# Patient Record
Sex: Female | Born: 1970 | Race: White | Hispanic: No | Marital: Married | Smoking: Former smoker
Health system: Southern US, Community
[De-identification: ages and names within clinical notes are randomized; demographics above are authoritative.]

## PROBLEM LIST (undated history)

## (undated) DIAGNOSIS — Z87898 Personal history of other specified conditions: Secondary | ICD-10-CM

## (undated) DIAGNOSIS — N6019 Diffuse cystic mastopathy of unspecified breast: Secondary | ICD-10-CM

## (undated) DIAGNOSIS — Z8669 Personal history of other diseases of the nervous system and sense organs: Secondary | ICD-10-CM

## (undated) DIAGNOSIS — S43439A Superior glenoid labrum lesion of unspecified shoulder, initial encounter: Secondary | ICD-10-CM

## (undated) DIAGNOSIS — T7840XA Allergy, unspecified, initial encounter: Secondary | ICD-10-CM

## (undated) DIAGNOSIS — Z8742 Personal history of other diseases of the female genital tract: Secondary | ICD-10-CM

## (undated) DIAGNOSIS — I519 Heart disease, unspecified: Secondary | ICD-10-CM

## (undated) DIAGNOSIS — L404 Guttate psoriasis: Secondary | ICD-10-CM

## (undated) DIAGNOSIS — J45909 Unspecified asthma, uncomplicated: Secondary | ICD-10-CM

## (undated) DIAGNOSIS — L719 Rosacea, unspecified: Secondary | ICD-10-CM

## (undated) DIAGNOSIS — K219 Gastro-esophageal reflux disease without esophagitis: Secondary | ICD-10-CM

## (undated) HISTORY — DX: Gastro-esophageal reflux disease without esophagitis: K21.9

## (undated) HISTORY — DX: Rosacea, unspecified: L71.9

## (undated) HISTORY — DX: Heart disease, unspecified: I51.9

## (undated) HISTORY — DX: Diffuse cystic mastopathy of unspecified breast: N60.19

## (undated) HISTORY — PX: TOTAL ABDOMINAL HYSTERECTOMY: SHX209

## (undated) HISTORY — DX: Superior glenoid labrum lesion of unspecified shoulder, initial encounter: S43.439A

## (undated) HISTORY — DX: Personal history of other specified conditions: Z87.898

## (undated) HISTORY — DX: Personal history of other diseases of the nervous system and sense organs: Z86.69

## (undated) HISTORY — DX: Guttate psoriasis: L40.4

## (undated) HISTORY — DX: Unspecified asthma, uncomplicated: J45.909

## (undated) HISTORY — DX: Allergy, unspecified, initial encounter: T78.40XA

## (undated) HISTORY — PX: BREAST SURGERY: SHX581

## (undated) HISTORY — PX: BREAST EXCISIONAL BIOPSY: SUR124

## (undated) HISTORY — DX: Personal history of other diseases of the female genital tract: Z87.42

---

## 1992-01-02 HISTORY — PX: TONSILLECTOMY: SUR1361

## 1992-01-02 HISTORY — PX: LAPAROSCOPY: SHX197

## 1993-01-01 HISTORY — PX: TONSILLECTOMY AND ADENOIDECTOMY: SUR1326

## 2004-01-02 HISTORY — PX: COLONOSCOPY: SHX174

## 2004-06-06 ENCOUNTER — Inpatient Hospital Stay: Payer: Self-pay | Admitting: Internal Medicine

## 2004-06-06 ENCOUNTER — Other Ambulatory Visit: Payer: Self-pay

## 2004-06-13 ENCOUNTER — Ambulatory Visit: Payer: Self-pay | Admitting: Internal Medicine

## 2004-07-12 ENCOUNTER — Ambulatory Visit: Payer: Self-pay | Admitting: Cardiovascular Disease

## 2005-01-01 HISTORY — PX: CARDIAC CATHETERIZATION: SHX172

## 2005-01-12 ENCOUNTER — Ambulatory Visit: Payer: Self-pay | Admitting: Cardiovascular Disease

## 2005-03-22 ENCOUNTER — Ambulatory Visit: Payer: Self-pay | Admitting: Internal Medicine

## 2005-03-22 ENCOUNTER — Ambulatory Visit: Payer: Self-pay | Admitting: Cardiology

## 2005-03-23 ENCOUNTER — Observation Stay (HOSPITAL_COMMUNITY): Admission: EM | Admit: 2005-03-23 | Discharge: 2005-03-24 | Payer: Self-pay | Admitting: Emergency Medicine

## 2005-03-28 ENCOUNTER — Ambulatory Visit: Payer: Self-pay

## 2005-03-28 ENCOUNTER — Encounter: Payer: Self-pay | Admitting: Cardiology

## 2005-04-04 ENCOUNTER — Ambulatory Visit: Payer: Self-pay | Admitting: Internal Medicine

## 2005-04-04 ENCOUNTER — Ambulatory Visit (HOSPITAL_COMMUNITY): Admission: RE | Admit: 2005-04-04 | Discharge: 2005-04-04 | Payer: Self-pay | Admitting: Internal Medicine

## 2005-05-01 ENCOUNTER — Ambulatory Visit: Payer: Self-pay | Admitting: Cardiology

## 2005-05-03 ENCOUNTER — Ambulatory Visit: Payer: Self-pay | Admitting: Cardiology

## 2005-05-10 ENCOUNTER — Ambulatory Visit: Payer: Self-pay | Admitting: Cardiology

## 2005-06-05 ENCOUNTER — Ambulatory Visit: Payer: Self-pay | Admitting: General Surgery

## 2005-06-11 ENCOUNTER — Ambulatory Visit (HOSPITAL_COMMUNITY): Admission: RE | Admit: 2005-06-11 | Discharge: 2005-06-11 | Payer: Self-pay | Admitting: Cardiology

## 2005-06-11 ENCOUNTER — Ambulatory Visit: Payer: Self-pay | Admitting: Cardiology

## 2005-06-25 ENCOUNTER — Ambulatory Visit: Payer: Self-pay | Admitting: Cardiology

## 2005-10-04 ENCOUNTER — Ambulatory Visit: Payer: Self-pay

## 2005-10-11 ENCOUNTER — Ambulatory Visit: Payer: Self-pay | Admitting: Cardiology

## 2006-01-01 HISTORY — PX: CHOLECYSTECTOMY: SHX55

## 2006-01-01 HISTORY — PX: BREAST MASS EXCISION: SHX1267

## 2006-02-18 ENCOUNTER — Emergency Department: Payer: Self-pay

## 2006-03-19 ENCOUNTER — Ambulatory Visit: Payer: Self-pay | Admitting: General Practice

## 2006-04-18 ENCOUNTER — Ambulatory Visit: Payer: Self-pay | Admitting: Cardiology

## 2006-06-06 ENCOUNTER — Ambulatory Visit: Payer: Self-pay | Admitting: General Surgery

## 2006-07-24 ENCOUNTER — Ambulatory Visit: Payer: Self-pay | Admitting: Orthopaedic Surgery

## 2006-11-19 ENCOUNTER — Ambulatory Visit: Payer: Self-pay | Admitting: Cardiology

## 2007-02-04 ENCOUNTER — Ambulatory Visit: Payer: Self-pay | Admitting: Internal Medicine

## 2007-02-04 ENCOUNTER — Ambulatory Visit (HOSPITAL_COMMUNITY): Admission: RE | Admit: 2007-02-04 | Discharge: 2007-02-04 | Payer: Self-pay | Admitting: Cardiology

## 2007-02-17 ENCOUNTER — Ambulatory Visit: Payer: Self-pay

## 2007-04-07 ENCOUNTER — Ambulatory Visit: Payer: Self-pay | Admitting: Internal Medicine

## 2007-04-07 LAB — CONVERTED CEMR LAB
ALT: 16 units/L (ref 0–35)
AST: 17 units/L (ref 0–37)
Albumin: 4.1 g/dL (ref 3.5–5.2)
Alkaline Phosphatase: 78 units/L (ref 39–117)
BUN: 9 mg/dL (ref 6–23)
Basophils Absolute: 0.1 10*3/uL (ref 0.0–0.1)
Basophils Relative: 0.8 % (ref 0.0–1.0)
Bilirubin, Direct: 0.1 mg/dL (ref 0.0–0.3)
CO2: 27 meq/L (ref 19–32)
Calcium: 9.4 mg/dL (ref 8.4–10.5)
Chloride: 104 meq/L (ref 96–112)
Creatinine, Ser: 1 mg/dL (ref 0.4–1.2)
Eosinophils Absolute: 0.1 10*3/uL (ref 0.0–0.7)
Eosinophils Relative: 1.6 % (ref 0.0–5.0)
GFR calc Af Amer: 82 mL/min
GFR calc non Af Amer: 67 mL/min
Glucose, Bld: 83 mg/dL (ref 70–99)
HCT: 38.8 % (ref 36.0–46.0)
Hemoglobin: 13.6 g/dL (ref 12.0–15.0)
Lymphocytes Relative: 28 % (ref 12.0–46.0)
MCHC: 35.1 g/dL (ref 30.0–36.0)
MCV: 93.1 fL (ref 78.0–100.0)
Monocytes Absolute: 0.5 10*3/uL (ref 0.1–1.0)
Monocytes Relative: 7 % (ref 3.0–12.0)
Neutro Abs: 4.1 10*3/uL (ref 1.4–7.7)
Neutrophils Relative %: 62.6 % (ref 43.0–77.0)
Platelets: 204 10*3/uL (ref 150–400)
Potassium: 4.2 meq/L (ref 3.5–5.1)
RBC: 4.17 M/uL (ref 3.87–5.11)
RDW: 12 % (ref 11.5–14.6)
Sodium: 135 meq/L (ref 135–145)
TSH: 1.02 microintl units/mL (ref 0.35–5.50)
Total Bilirubin: 0.8 mg/dL (ref 0.3–1.2)
Total Protein: 6.6 g/dL (ref 6.0–8.3)
WBC: 6.6 10*3/uL (ref 4.5–10.5)

## 2007-06-26 ENCOUNTER — Ambulatory Visit: Payer: Self-pay | Admitting: General Surgery

## 2008-01-02 HISTORY — PX: BREAST BIOPSY: SHX20

## 2008-06-29 ENCOUNTER — Ambulatory Visit: Payer: Self-pay | Admitting: General Surgery

## 2008-07-16 ENCOUNTER — Encounter: Payer: Self-pay | Admitting: Cardiology

## 2008-07-28 ENCOUNTER — Telehealth (INDEPENDENT_AMBULATORY_CARE_PROVIDER_SITE_OTHER): Payer: Self-pay | Admitting: *Deleted

## 2008-08-20 ENCOUNTER — Telehealth: Payer: Self-pay | Admitting: Cardiology

## 2009-07-05 ENCOUNTER — Encounter: Payer: Self-pay | Admitting: Family Medicine

## 2009-07-05 ENCOUNTER — Ambulatory Visit: Payer: Self-pay | Admitting: General Surgery

## 2009-07-05 LAB — HM MAMMOGRAPHY

## 2009-07-12 ENCOUNTER — Encounter: Payer: Self-pay | Admitting: Family Medicine

## 2009-08-11 ENCOUNTER — Ambulatory Visit: Payer: Self-pay | Admitting: Internal Medicine

## 2009-08-11 DIAGNOSIS — Z9189 Other specified personal risk factors, not elsewhere classified: Secondary | ICD-10-CM | POA: Insufficient documentation

## 2009-08-11 DIAGNOSIS — Z8669 Personal history of other diseases of the nervous system and sense organs: Secondary | ICD-10-CM | POA: Insufficient documentation

## 2009-08-11 DIAGNOSIS — N63 Unspecified lump in unspecified breast: Secondary | ICD-10-CM | POA: Insufficient documentation

## 2009-08-11 DIAGNOSIS — E669 Obesity, unspecified: Secondary | ICD-10-CM | POA: Insufficient documentation

## 2009-08-12 ENCOUNTER — Ambulatory Visit: Payer: Self-pay | Admitting: Internal Medicine

## 2009-08-12 LAB — CONVERTED CEMR LAB
BUN: 11 mg/dL (ref 6–23)
CO2: 28 meq/L (ref 19–32)
Calcium: 9.2 mg/dL (ref 8.4–10.5)
Chloride: 107 meq/L (ref 96–112)
Cholesterol: 168 mg/dL (ref 0–200)
Creatinine, Ser: 0.8 mg/dL (ref 0.4–1.2)
GFR calc non Af Amer: 81.24 mL/min (ref >60)
Glucose, Bld: 91 mg/dL (ref 70–99)
HDL: 41.6 mg/dL (ref >39.00)
LDL Cholesterol: 89 mg/dL (ref 0–99)
Potassium: 4.3 meq/L (ref 3.5–5.1)
Sodium: 141 meq/L (ref 135–145)
TSH: 0.91 microintl units/mL (ref 0.35–5.50)
Total CHOL/HDL Ratio: 4
Triglycerides: 187 mg/dL — ABNORMAL HIGH (ref 0.0–149.0)
VLDL: 37.4 mg/dL (ref 0.0–40.0)

## 2009-08-15 ENCOUNTER — Encounter: Payer: Self-pay | Admitting: Family Medicine

## 2009-08-16 ENCOUNTER — Encounter: Payer: Self-pay | Admitting: Family Medicine

## 2009-08-16 ENCOUNTER — Encounter: Admission: RE | Admit: 2009-08-16 | Discharge: 2009-08-16 | Payer: Self-pay | Admitting: Surgery

## 2009-09-13 ENCOUNTER — Ambulatory Visit: Payer: Self-pay | Admitting: Internal Medicine

## 2009-09-13 DIAGNOSIS — H6590 Unspecified nonsuppurative otitis media, unspecified ear: Secondary | ICD-10-CM | POA: Insufficient documentation

## 2010-01-31 NOTE — Assessment & Plan Note (Signed)
Summary: NEW PT TO EST/CPX/CLE   Vital Signs:  Patient profile:   40 year old female Height:      64.5 inches Weight:      205.25 pounds BMI:     34.81 Temp:     98.4 degrees F oral Pulse rate:   60 / minute Pulse rhythm:   regular BP sitting:   122 / 84  (left arm) Cuff size:   large  Vitals Entered By: Selena Batten Dance CMA Duncan Dull) (August 11, 2009 10:24 AM) CC: New patient to establish   History of Present Illness: CC: new patient to establish  1. fm hx brca - strong fmhx (mother, maternal gma).  Pt with h/o breast biopsies and lumpectomy.  Mammo - 07/05/2009 - normal.  [place] (given hx of previous masses) showed lump with darkening around it.  Not thought to be fibrocystic changes.  Dr. Bernadette Hoit at [facility] said just to watch it.  Pt worried about watchful waiting, would like second opinion on this breast mass (given previous masses have been aggressively worked up).  2. weight gain - last year gained more weight than usual.  No change in diet, + less activity now because has sedentary job.  + constipation, no skin changes, hair changes.  No heat or cold intolerance.  since quite smoking, drinks 1 gallon sweet tea a day.  3. syncope episodes - cards Dr. Antoine Poche, passing out episodes.  w/u normal.  thought 2/2 overmedication?  Preventive Screening-Counseling & Management  Alcohol-Tobacco     Alcohol drinks/day: <1     Smoking Status: current     Year Started: 1986     Year Quit: 2004  Caffeine-Diet-Exercise     Caffeine use/day: 1 gallon/day      Sexual History:  currently monogamous.        Drug Use:  never.        Blood Transfusions:  no.    -  Date:  04/16/2007    TD booster done  Current Medications (verified): 1)  Cvs Ibuprofen 200 Mg Caps (Ibuprofen) 2)  Daily Value Multivitamin  Tabs (Multiple Vitamin) .... One Dialy  Allergies (verified): No Known Drug Allergies  Past History:  Past Medical History: GERD Migraines (occasional, improved since  hysterectomy) Syncope - cards w/u WNL (Hochrein) thought 2/2 overmedication vs arrhythmias Endometriosis  G2P2  Past Surgical History: Laparoscopy 1994 Endometriosis Cholecystectomy 2008 Mult breast biopsies bilaterally and mass removed right breast  ~2008, L breast core biopsy 2010 T&A 1995 Hysterectomy 2002, thinks needs pap smear (Dr. Laqueta Carina [facility])  Family History: BRCA - mother, GMA (mastectomy), father's GMA Skin CA - mother Son - hyperinsulinemic Mother - HTN, BRCA,  GMA - CVA Father and paternal uncles - CAD/MI starting age 59s  No colon CA  Social History: Quit smoking, social EtOH, no rec drugs Occupation - Print production planner Lives with husband and 2 children 1 dogSmoking Status:  current Caffeine use/day:  1 gallon/day Drug Use:  never Sexual History:  currently monogamous Blood Transfusions:  no  Review of Systems       The patient complains of weight gain, peripheral edema, and breast masses.  The patient denies anorexia, fever, weight loss, vision loss, decreased hearing, hoarseness, chest pain, syncope, dyspnea on exertion, prolonged cough, headaches, hemoptysis, abdominal pain, melena, hematochezia, severe indigestion/heartburn, hematuria, incontinence, muscle weakness, suspicious skin lesions, transient blindness, difficulty walking, and depression.    Physical Exam  General:  Well-developed,well-nourished,in no acute distress; alert,appropriate and cooperative throughout examination  Head:  Normocephalic and atraumatic without obvious abnormalities. No apparent alopecia or balding. Eyes:  No corneal or conjunctival inflammation noted. EOMI. Perrla.  Ears:  External ear exam shows no significant lesions or deformities.  Otoscopic examination reveals clear canals, tympanic membranes are intact bilaterally without bulging, retraction, inflammation or discharge. Hearing is grossly normal bilaterally. Nose:  External nasal examination shows no deformity or  inflammation. Nasal mucosa are pink and moist without lesions or exudates. Mouth:  Oral mucosa and oropharynx without lesions or exudates.  Teeth in good repair. Neck:  No deformities, masses, or tenderness noted. Breasts:  L breast with small mobile mass  ~1.5cm size central left hemisphere, feels cystic.  no axillary adenopathy Lungs:  Normal respiratory effort, chest expands symmetrically. Lungs are clear to auscultation, no crackles or wheezes. Heart:  Normal rate and regular rhythm. S1 and S2 normal without gallop, murmur, click, rub or other extra sounds. Abdomen:  Bowel sounds positive,abdomen soft and non-tender without masses, organomegaly or hernias noted. Msk:  No deformity or scoliosis noted of thoracic or lumbar spine.   Pulses:  2+ periph pulses Extremities:  No clubbing, cyanosis, edema, or deformity noted with normal full range of motion of all joints.   Skin:  Intact without suspicious lesions or rashes   Impression & Recommendations:  Problem # 1:  OBESITY, UNSPECIFIED (ICD-278.00) check TSH, FLP, glu.  likely attributable to sedentary lifestyle, encourage exercise, diet changes.  dsicuss at next visit. Ht: 64.5 (08/11/2009)   Wt: 205.25 (08/11/2009)   BMI: 34.81 (08/11/2009)  Problem # 2:  SYNCOPE, HX OF (ICD-V12.49) no recurrence.  Problem # 3:  BREAST MASS, LEFT (ICD-611.72) found on routine [place] given fm hx and pt hx of previous masses (all have been normal up todate).  send to [facility] for second opinion.  obtain records of [place] and mammogram.  pt declines BRCA genetic screen because feels she already knows she has increased risk 2/2 family hx. Orders: Surgical Referral (Surgery)  Problem # 4:  BREAST MASS, HX OF (ICD-V15.89) see above. Orders: Surgical Referral (Surgery)  Complete Medication List: 1)  Cvs Ibuprofen 200 Mg Caps (Ibuprofen) 2)  Daily Value Multivitamin Tabs (Multiple vitamin) .... One dialy  Patient Instructions: 1)  Please return in 1 month for  follow up on weight. 2)  We will obtain records for your latest mammogram and breast US and send you to [facility] [place] [facility] for a second opinion.  Pass by Marion's office for referral. 3)  Please return fasting for blood work either tomorrow or next week. [BMP, FLP, TSH 278.00] 4)  Call clinic with questions.  Pleasure to meet you today.  Prior Medications: Current Allergies (reviewed today): No known allergies    Prevention & Chronic Care Immunizations   Influenza vaccine: Not documented    Tetanus booster: 04/16/2007: done    Pneumococcal vaccine: Not documented  Other Screening   Pap smear: Not documented   Smoking status: current  (08/11/2009)  Lipids   Total Cholesterol: Not documented   LDL: Not documented   LDL Direct: Not documented   HDL: Not documented   Triglycerides: Not documented

## 2010-01-31 NOTE — Consult Note (Signed)
Summary: [facility] - schedule [place] at breast center to f/u mass Dr. [facility]   Imported By: Lanelle Bal 09/07/2009 14:20:40  _____________________________________________________________________  External Attachment:    Type:   Image     Comment:   External Document

## 2010-01-31 NOTE — Letter (Signed)
Summary: [facility]  [facility]   Imported By: Lanelle Bal 08/18/2009 12:49:14  _____________________________________________________________________  External Attachment:    Type:   Image     Comment:   External Document

## 2010-01-31 NOTE — Miscellaneous (Signed)
Summary: mammo input   Clinical Lists Changes  Observations: Added new observation of MAMMOGRAM: birads -2 (07/05/2009 8:58)       Allergies: 1)  ! * Shellfish    -  Date:  07/05/2009    Mammogram birads -2  Appended Document: pap input     Clinical Lists Changes  Observations: Added new observation of PAST SURG HX: Laparoscopy 1994 Endometriosis Cholecystectomy 2008 Mult breast biopsies bilaterally and mass removed right breast  ~2008, L breast core biopsy 2010 T&A 1995 Hysterectomy 2002, thinks needs pap smear (Dr. Harold [facility]) (09/01/2009 12:11) Added new observation of PAP DUE: 03/06/2004 (09/01/2009 12:11) Added new observation of TDBOOSTDUE: 04/15/2017 (09/01/2009 12:11) Added new observation of DM PROGRESS: N/A (09/01/2009 12:11) Added new observation of DM FSREVIEW: N/A (09/01/2009 12:11) Added new observation of HTN PROGRESS: N/A (09/01/2009 12:11) Added new observation of HTN FSREVIEW: N/A (09/01/2009 12:11) Added new observation of LIPID PROGRS: N/A (09/01/2009 12:11) Added new observation of LIPID FSREVW: N/A (09/01/2009 12:11) Added new observation of PAP SMEAR: WNL (03/06/2001 12:12)       Prevention & Chronic Care Immunizations   Influenza vaccine: Not documented    Tetanus booster: 04/16/2007: done   Tetanus booster due: 04/15/2017    Pneumococcal vaccine: Not documented  Other Screening   Pap smear: WNL  (03/06/2001)   Pap smear due: 03/06/2004   Smoking status: current  (08/11/2009)  Lipids   Total Cholesterol: 168  (08/12/2009)   LDL: 89  (08/12/2009)   LDL Direct: Not documented   HDL: 41.60  (08/12/2009)   Triglycerides: 187.0  (08/12/2009)     Past History:  Past Surgical History: Laparoscopy 1994 Endometriosis Cholecystectomy 2008 Mult breast biopsies bilaterally and mass removed right breast  ~2008, L breast core biopsy 2010 T&A 1995 Hysterectomy 2002, thinks needs pap smear (Dr. [facility])   -  Date:  03/06/2001    PAP WNL

## 2010-01-31 NOTE — Assessment & Plan Note (Signed)
Summary: 1 MONTH FOLLOW UP ON WEIGHT/RBH   Vital Signs:  Patient profile:   40 year old female Weight:      202.75 pounds Temp:     98.4 degrees F oral Pulse rate:   76 / minute Pulse rhythm:   regular BP sitting:   108 / 70  (left arm) Cuff size:   large  Vitals Entered By: Selena Batten Dance CMA (AAMA) (September 13, 2009 8:04 AM) CC: 1 month follow up   CC:  1 month follow up.  History of Present Illness: CC: f/u weight, breast  2nd opinion on breast - Seen by Dr. Magnus Ivan [facility], sent upstairs to breast center and had repeat mammogram - was told normal and to f/u at 40yo, but never received f/u instructions from Dr. Magnus Ivan.  Ear or tooth pain - iburofen helping, but still having pain daily.  Slight pain with opening mouth, no trouble hearing or muffled hearing, no fevers.  Going on for 1 wk.  did have tooth filling L lower molar 2 wks ago.  Weight- starting going to gym, eating  better.  Walking.  Switched to unsweet tea with splenda.  Now eating salad for lunch every day.  Then at dinner eating steak and chicken.  Joined nutrition club in [place].  down 3 lbs  Current Medications (verified): 1)  Daily Value Multivitamin  Tabs (Multiple Vitamin) .... One Dialy 2)  Claritin-D 24 Hour 10-240 Mg Xr24h-Tab (Loratadine-Pseudoephedrine) .... Take One By Mouth Daily For Congestion 3)  Cvs Ibuprofen 200 Mg Caps (Ibuprofen) 4)  Fish Oil 1000 Mg Caps (Omega-3 Fatty Acids) .... 3 A Day  Allergies: 1)  ! * Shellfish  Past History:  Past Surgical History: Last updated: 09/01/2009 Laparoscopy 1994 Endometriosis Cholecystectomy 2008 Mult breast biopsies bilaterally and mass removed right breast  ~2008, L breast core biopsy 2010 T&A 1995 Hysterectomy 2002, thinks needs pap smear (Dr. Lanice Schwab [facility])  Family History: Last updated: 08/11/2009 BRCA - mother, GMA (mastectomy), father's GMA Skin CA - mother Son - hyperinsulinemic Mother - HTN, BRCA,  GMA - CVA Father and  paternal uncles - CAD/MI starting age 69s  No colon CA  Social History: Last updated: 08/11/2009 Quit smoking, social EtOH, no rec drugs Occupation - Print production planner Lives with husband and 2 children 1 dog  Past Medical History: GERD Migraines (occasional, improved since hysterectomy) Syncope - cards w/u WNL (Hochrein) thought 2/2 overmedication vs arrhythmias Endometriosis s/p hysterectomy  G2P2  Review of Systems       per HPI  Physical Exam  General:  Well-developed,well-nourished,in no acute distress; alert,appropriate and cooperative throughout examination Head:  Normocephalic and atraumatic without obvious abnormalities. No apparent alopecia or balding. Eyes:  No corneal or conjunctival inflammation noted. EOMI. Perrla.  Ears:  External ear exam shows no significant lesions or deformities.  Otoscopic examination reveals clear canals, tympanic membranes are intact bilaterally without bulging, retraction, inflammation or discharge. Hearing is grossly normal bilaterally.  + fluid behind both ears Nose:  External nasal examination shows no deformity or inflammation. Nasal mucosa are pink and moist without lesions or exudates. Mouth:  Oral mucosa and oropharynx without lesions or exudates.  Teeth in good repair.  teeth left side upper and lower nontender to touch Neck:  No deformities, masses, or tenderness noted.   Impression & Recommendations:  Problem # 1:  OTITIS MEDIA, SEROUS (ICD-381.4) rec try claritin D, continue ibuprofen for recent tooth work.  likely combination of both of these with some ?eustachian tube  dysfunction.  advised to let me know if not improving, pt with h/o sinusitis in past.  Problem # 2:  OBESITY, UNSPECIFIED (ICD-278.00) blood work WNL.  did discuss healthy eating and increased exercise.  pt exhibiting many improved lifestyle changes.  continue to monitor. Ht: 64.5 (08/11/2009)   Wt: 202.75 (09/13/2009)   BMI: 34.81 (08/11/2009)  Problem # 3:   NEOPLASM, MALIGNANT, BREAST, FAMILY HX, MOTHER (ICD-V16.3) seen by Dr. Magnus Ivan for second opinion on L breast mass.  sent for rpt mammogram, no changes.  doesn't think had Korea, didn't have f/u by Dr. Magnus Ivan, but in his note mentions he wanted [place] and to f/u with patient after studies.  Advised patient to call Dr. Eliberto Ivory office for f/u, if cannot reach him to call me and I will contact them.  Complete Medication List: 1)  Daily Value Multivitamin Tabs (Multiple vitamin) .... One dialy 2)  Claritin-d 24 Hour 10-240 Mg Xr24h-tab (Loratadine-pseudoephedrine) .... Take one by mouth daily for congestion 3)  Cvs Ibuprofen 200 Mg Caps (Ibuprofen) 4)  Fish Oil 1000 Mg Caps (Omega-3 fatty acids) .... 3 a day  Patient Instructions: 1)  Call Dr. Magnus Ivan to ask about follow up plan. 2)  Pleasure to see you today. 3)  Keep up the good work. 4)  Let us know if you have any questions. 5)  For ear - use allegra D or claritin D.  prescription sent in. Prescriptions: CLARITIN-D 24 HOUR 10-240 MG XR24H-TAB (LORATADINE-PSEUDOEPHEDRINE) take one by mouth daily for congestion  #30 x 3   Entered and Authorized by:   Eustaquio Boyden  MD   Signed by:   Eustaquio Boyden  MD on 09/13/2009   Method used:   Electronically to        [id number] (retail)       [address]       [place], [place]  [address]       Ph: [phone]       Fax: [phone]   RxID:   [id number]   Current Allergies (reviewed today): ! * SHELLFISH   Prevention & Chronic Care Immunizations   Influenza vaccine: Not documented    Tetanus booster: 04/16/2007: done   Tetanus booster due: 04/15/2017    Pneumococcal vaccine: Not documented  Other Screening   Pap smear: WNL  (03/06/2001)   Pap smear due: 03/06/2004   Smoking status: current  (08/11/2009)  Lipids   Total Cholesterol: 168  (08/12/2009)   LDL: 89  (08/12/2009)   LDL Direct: Not documented   HDL: 41.60  (08/12/2009)   Triglycerides:  187.0  (08/12/2009)   Appended Document: 1 MONTH FOLLOW UP ON WEIGHT/RBH correction: pt did have mammogram and [place] at breast center, not just mammo.

## 2010-05-16 NOTE — Op Note (Signed)
Cassie [name], Cassie [name]               ACCOUNT NO.:  [id number]   MEDICAL RECORD NO.:  [id number]          PATIENT TYPE:  OIB   LOCATION:  2857                         FACILITY:  [facility]   PHYSICIAN:  Duke Salvia, MD, FACCDATE OF BIRTH:  Dec 21, 1970   DATE OF PROCEDURE:  02/04/2007  DATE OF DISCHARGE:  02/04/2007                               OPERATIVE REPORT   PREOPERATIVE DIAGNOSIS:  Syncope with previously implanted loop  recorder.   POSTOPERATIVE DIAGNOSIS:  Syncope with previously implanted loop  recorder.   PROCEDURE:  Explantation of a previously implanted loop recorder.   [name] was brought to the catheterization laboratory and placed on  the fluoroscopic table in supine position.  After routine prep and  drape, lidocaine was infiltrated along the line of the previous incision  and carried down to layer of the device pocket.  The previously  implanted loop recorder was freed up and removed, retaining sutures were  removed. the pocket was then closed in the anterior and posterior plane  with a 2-0 Vicryl suture.  The wound was copiously irrigated with  antibiotic containing saline solution and the wound was washed, dried  and a benzoin and Steri-Strip dressing was applied.  Needle counts,  sponge counts and instrument counts were correct at the end of the  procedure according to the staff.  The patient tolerated the procedure  without apparent complication.      Duke Salvia, MD, [facility]  Electronically Signed     SCK/MEDQ  D:  02/28/2007  T:  03/02/2007  Job:  [id number]

## 2010-05-16 NOTE — H&P (Signed)
Cassie [name], Cassie [name]               ACCOUNT NO.:  [id number]   MEDICAL RECORD NO.:  [id number]          PATIENT TYPE:  OIB   LOCATION:  2857                         FACILITY:  [facility]   PHYSICIAN:  Nicolasa Ducking, ANP DATE OF BIRTH:  08/01/1970   DATE OF ADMISSION:  02/04/2007  DATE OF DISCHARGE:                              HISTORY & PHYSICAL   PRIMARY CARDIOLOGIST:  Dr. Rollene Rotunda.   PRIMARY CARE Myndi Wamble:  She does not have one.   PATIENT PROFILE:  A 40 year old married, Caucasian female with history  of syncope presents today for explant of implantable loop recorder.   PROBLEMS:  1. Syncope.      a.     Status post catheterization in 2006 in [place] performed       by Dr. Park Breed showing normal coronary arteries.      b.     On February 28, 2005, a 2-D echocardiogram EF 55 to 60%.       Normal wall motion.      c.     On April 04, 2005, negative tilt table test.      d.     On June 11, 2005, implantable Medtronic loop recorder.  2. Status post hysterectomy secondary to endometriosis.  3. Status post cholecystectomy.  4. Status post tonsillectomy and adenoidectomy.  5. Status post right breast lumpectomy.  6. History of pneumonia in December 2008.  7. History of rectal bleeding in 2006 status post colonoscopy.   HISTORY OF PRESENT ILLNESS:  A 40 year old, married, Caucasian female  with history of syncope in 2006 and again in 2007.  She had a cardiac  catheterization in 2006 which was normal and then an echo in 2007  showing normal LV function.  A decision was made to place an implantable  loop recorder in June 2007 following a negative tilt table test.  Over  the past year and half, she has had some episodes of dizziness as well  sinus tachycardia, but no significant arrhythmias or syncope.  She  presents today for loop explant.   ALLERGIES:  INTOLERANCE TO PREDNISONE.   HOME MEDICATIONS:  None.   FAMILY HISTORY:  Mother is alive at age 67 with history of breast  cancer  and polio.  Father is alive at age 76 with a history of MI in  hyperlipidemia.  She has 1 brother and 1 sister, both are alive and  well.   SOCIAL HISTORY:  She lives in [place] with her husband.  She works as a  Conservation officer, [employer].  She has 2 children.  She denies any tobacco, alcohol,  or drug use and is not routinely exercising.   REVIEW OF SYSTEMS:  Positive for occasional dizziness when bending down.  She occasionally has constipation.  Otherwise, all systems reviewed  negative.   PHYSICAL EXAM:  VITAL SIGNS:  Temperature 98, heart rate 74,  respirations 16, blood pressure 120/73, pulse ox 98% room air.  GENERAL:  Pleasant, white female in no acute distress.  Awake, alert and  oriented x3.  NECK:  No bruits.  No JVD.  LUNGS:  Respirations are unlabored.  CARDIAC:  Regular S1, S2.  No S3, S4, or murmurs.  ABDOMEN:  Soft, nontender, nondistended.  Bowel sounds present x4.  EXTREMITIES:  Warm, dry, pink.  No clubbing, cyanosis, or edema.  Dorsalis pedis and posterior tibial pulses 2+ bilaterally.   Hemoglobin 13.2, hematocrit 37.7, WBC 8.1, platelets 198.   ASSESSMENT/PLAN:  History of syncope status post implantable loop  recorder.  No events over the past year and a half.  Explant today with  Dr. Antoine Poche.      Nicolasa Ducking, ANP     CB/MEDQ  D:  02/04/2007  T:  02/04/2007  Job:  [id number]

## 2010-05-16 NOTE — Assessment & Plan Note (Signed)
[facility]                            CARDIOLOGY OFFICE NOTE   Cassie [name], Cassie [name]                        MRN:          [id number]  DATE:11/19/2006                            DOB:          October 09, 1970    PRIMARY CARE PHYSICIAN:  None.   REASON FOR PRESENTATION:  Evaluate patient with syncope.   HISTORY OF PRESENT ILLNESS:  The patient is 40 years old.  She had a  loop monitor placed in December of last year.  She was supposed to come  back in September for followup and probably loop explantation.  However,  she did not come to this appointment, but comes today.  Since she has  had the monitor, she has had absolutely no syncopal spells or  presyncope.  She denies any palpitations.  She has had no chest  discomfort or shortness of breath.  She has had her loop interrogated a  couple of times, most recently in April of this year.  She has episodes  of tachycardia.  These seem to be gradual in onset and probably  represent sinus tachycardia.  They have happened when she is getting up  in the morning or getting ready to go out of her house.  She has had one  at 3 in the morning.  She is not describing any symptoms with these,  however.  The patient has not been exercising routinely.  She has been  recovering from a nasty injury with a broken right wrist.   PAST MEDICAL HISTORY:  Syncope, hysterectomy, endometriosis,  cholecystectomy, tonsillectomy, adenoidectomy, right breast lumpectomy.   ALLERGIES/INTOLERANCES:  She is not tolerant of PREDNISONE.   MEDICATIONS:  None.   REVIEW OF SYSTEMS:  As stated in the HPI and otherwise negative for  other systems.   PHYSICAL EXAMINATION:  The patient is in no distress.  Blood pressure  123/78, heart rate 77 and regular.  Weight 193 pounds, body mass index  30.  HEENT:  Eyelids unremarkable.  Pupils equal, round and reactive to  light.  Fundi not visualized.  Oral mucosa unremarkable.  NECK:  No jugular  venous distention, wave form within normal limits.  Carotid upstroke brisk and symmetric.  No bruits, no thyromegaly.  LYMPHATICS:  No cervical, axillary or inguinal adenopathy.  LUNGS:  Clear to auscultation bilaterally.  BACK:  No costovertebral angle tenderness.  CHEST:  Well-healed device pocket.  HEART:  PMI not displaced or sustained.  S1 and S2 within normal limits.  No S3, no S4, no clicks, no rubs, no murmurs.  ABDOMEN:  Obese, positive bowel sounds, normal in frequency and pitch.  No bruits, no rebound, no guarding, no midline pulsatile mass, no  hepatomegaly, no splenomegaly.  SKIN:  No rashes, no nodules.  EXTREMITIES:  Two-plus pulses throughout, no edema, no cyanosis, no  clubbing.  NEUROLOGIC:  Oriented to person, place and time.  Cranial nerves II  through XII grossly intact.  Motor grossly intact.   EKG:  Sinus rhythm, rate 79, axis within normal limits, intervals within  normal limits, no acute STT-wave changes.   ASSESSMENT AND  PLAN:  1. Syncope:  The patient has had no further syncope or presyncope      since she had a monitor implanted.  There have been dysrhythmias,      but they seem to be sinus tachycardia.  Regardless of the etiology,      there are absolutely no symptoms associated with these.  They seem      to be short-lived and relatively infrequent.  Given this, no      further cardiovascular testing is suggested, unless she has      recurrent symptoms.  She will need to be set up to have the loop      explanted.  2. Followup:  I will see her at the time of loop explant.     Rollene Rotunda, MD, [facility]  Electronically Signed    JH/MedQ  DD: 11/19/2006  DT: 11/20/2006  Job #: [id number]

## 2010-05-19 NOTE — Op Note (Signed)
[name], Cassie [name]               ACCOUNT NO.:  [id number]   MEDICAL RECORD NO.:  [id number]          PATIENT TYPE:  OIB   LOCATION:  2899                         FACILITY:  [facility]   PHYSICIAN:  Doylene Canning. Ladona Ridgel, M.D.  DATE OF BIRTH:  [birth year]   DATE OF PROCEDURE:  04/04/2005  DATE OF DISCHARGE:  04/04/2005                                 OPERATIVE REPORT   PROCEDURE NOTE:   PROCEDURE PERFORMED:  Head-up tilt table testing.   INDICATIONS:  Unexplained syncope.   1.  Introduction:  The patient is a very pleasant 40 year old woman with a history of recurrent  unexplained syncope, which has been present for nearly a year.  These  typically occur when she is in the upright position or going from sitting to  standing.  She has worn a cardiac monitor, which has been inconclusive.  She  is now referred for tilting.   1.  Procedure:  After informed consent was obtained, the patient was taken to the diagnostic  EP lab in the fasting state.  After the usual preparation and draping, she  was placed in the supine position where her initial blood pressure was  119/60 and her heart rate was 56.  After 5 minutes of equilibration, she was  placed in the 70 degree head-up tilt table position.  Her heart rate  increased into the high 70s.  Her blood pressure rose actually to 125/80.  Over the next 35 minutes, she was maintained in the 70 degree head-up tilt  table positron and her blood pressure remained essentially unchanged  dropping as low as 105/60 with heart rates increased up to a heart rate of  92 beats per minute.  During that period, she had very minimal amounts of  arm tingling but no other symptoms.  She was placed back in the supine  position.  Isoproterenol was initiated at 7.5 cc per minute and then  increased to 15 cc per minute with a concomitant increase in her heart rate  from 60 beats per minute to 85 beats per minute.  She was subsequently  placed back up in the 70 degree  head-up tilt  table position and remained in  this position for 10 minutes.  During this time, her heart rate increased  into the 120 range but her blood pressure did not get below 104/50.  There  were essentially no symptoms.  She was returned to the supine position and  then returned to her room in satisfactory condition.   1.  Complications:  There were no immediate complications.   IV:  Results:  This demonstrates a negative head-up tilt table test for inducible syncope.          ______________________________  Doylene Canning. Ladona Ridgel, M.D.    GWT/MEDQ  D:  04/04/2005  T:  04/05/2005  Job:  [id number]   cc:   Corinna L. Lendell Caprice, MD

## 2010-05-19 NOTE — H&P (Signed)
NAMEBRYANDA, Cassie [name]               ACCOUNT NO.:  [id number]   MEDICAL RECORD NO.:  [id number]          PATIENT TYPE:  OBV   LOCATION:  2002                         FACILITY:  [facility]   PHYSICIAN:  Corinna L. Lendell Caprice, MDDATE OF BIRTH:  [birth year]   DATE OF ADMISSION:  03/22/2005  DATE OF DISCHARGE:                                HISTORY & PHYSICAL   CHIEF COMPLAINT:  Passed out.   HISTORY OF PRESENT ILLNESS:  Mrs. Cassie [name] is a pleasant 40 year old white  female patient who presents to the emergency room after having passed out  while cooking for about a minute. She has had continuous chest pain for  about 36 hours. She saw her primary care physician yesterday and was told it  was reflux and started on a proton pump inhibitor. The patient reports that  she has a history of syncope in the past for some type of tachyarrhythmia.  She takes Toprol-XL for this reason. She reports that when her husband took  her pulse over the past several days it was in the 40s. Her heart rate has  been ranging in the mid to low 60s on telemetry here. She was started on  morphine and nitroglycerin paste and the pain is gone. She reports that it  is substernal and radiates underneath the left breast and through to the  left scapula. She reports that she took Pepcid, Motrin, and aspirin without  any change in the character of her pain. She does report that it is better  when lying down flat. She has not noticed any palpitations recently. She  reports that her friend said that she sounded short of breath on the phone  when she first started having chest pain. She had a cardiac catheterization,  she reports, 9 months ago at [facility] which was normal.  She is unassigned and her primary care physician is in [place], as is her  cardiologist. She denies any incontinence, mouth or tongue lacerations. She  reports that she threw up prior to coming into the hospital. She feels this  may have been  related to the pain.   PAST MEDICAL HISTORY:  As above.   MEDICATIONS:  1.  Toprol-XL 50 mg a day.  2.  Prilosec.   SOCIAL HISTORY:  The patient is married and is here with her husband. She  works part-time as a Conservation officer, nature. She does not smoke cigarettes. She  denies drugs. She rarely drinks alcohol.   FAMILY HISTORY:  Her father and many of her uncles had early coronary artery  disease. Her father had a heart attack before the age of 44.   REVIEW OF SYSTEMS:  As above, otherwise negative.   PHYSICAL EXAMINATION:  VITAL SIGNS:  Her heart rate currently is about 62.  She is afebrile, respiratory rate 16, blood pressure 110/80.  GENERAL:  The patient is well-developed, well-nourished, in no acute  distress.  HEENT:  Normocephalic, atraumatic. Pupils equal, round, and reactive to  light. Sclerae nonicteric. Moist mucous membranes.  NECK:  Supple. No carotid bruits, no thyromegaly or lymphadenopathy.  LUNGS:  Clear to  auscultation bilaterally without wheezes, rhonchi, or  rales.  CARDIOVASCULAR:  Regular rate and rhythm without murmurs, gallops, rubs.  There is no reproducible chest wall tenderness.  ABDOMEN:  Soft, nontender, nondistended.  GENITOURINARY AND RECTAL:  Deferred.  EXTREMITIES:  No clubbing, cyanosis, or edema.  SKIN:  No rash.  PSYCHIATRIC:  Normal affect.  NEUROLOGIC:  Alert and oriented. Cranial nerves and sensorimotor exam are  intact.   LABORATORY DATA:  H&H normal. INR normal. Basic metabolic panel  unremarkable. Three sets of point-of-care enzymes negative. EKG shows normal  sinus rhythm. Chest x-ray is negative. CT angiogram of the chest is negative  for PE. There is no aortic aneurysm or dissection, no masses, no pleural or  pericardial effusion.   ASSESSMENT AND PLAN:  1.  Syncope:  Consider transient bradycardia secondary to Toprol-XL. I will      hold the Toprol-XL for now and place the patient on observation on      telemetry. Also, consider  vasovagal.  2.  Atypical chest pain:  I will ask for records from [facility] I do not feel that she needs further cardiac workup as she had      a reportedly normal cardiac catheterization 9 months ago. Also, PE has      been ruled out. I suspect this may be musculoskeletal or GI.  3.  History of syncope in the past related to some type of tachyarrhythmia.      Corinna L. Lendell Caprice, MD  Electronically Signed     CLS/MEDQ  D:  03/23/2005  T:  03/24/2005  Job:  [id number]

## 2010-05-19 NOTE — Assessment & Plan Note (Signed)
[facility]                            CARDIOLOGY OFFICE NOTE   DANNA, SEWELL                        MRN:          [id number]  DATE:04/18/2006                            DOB:          12-24-1970    REASON:  Patient with syncope.   HISTORY OF PRESENT ILLNESS:  The patient returns for followup.  She has  had no problems since I last saw her.  She has had no presyncope or  syncope.  She has not felt any palpitations.  She denies any chest  discomfort or shortness of breath.   We did interrogate a loop.  She had 5 episodes of the tachycardia.  Some  of these appeared to be sinus with rates in the 130s.  Artifact may have  triggered the auto capture which is set at 150.  She did have 1 episode  that appeared to be 150 without clear P waves.  It is not clear to me  whether this could have been flutter or another SVT.  The duration was  at least 2 minutes.  However, the patient did not feel any of this.  Again she has had no symptoms.  She does not recall doing any exertional  activity with these episodes that might have induced sinus tachycardia.   PAST MEDICAL HISTORY:  Syncope (see the March 24, 2005 note for details  of the workup).  Hysterectomy, endometriosis, cholecystectomy,  tonsillectomy, adenoidectomy, right breast lumpectomy.   ALLERGIES:  She could not tolerate PREDNISONE in the past.   MEDICATIONS:  None.   REVIEW OF SYSTEMS:  Are stated in the HPI, otherwise negative for other  systems.   PHYSICAL EXAMINATION:  The patient is in no distress.  Blood pressure 118/82, heart rate 85 and regular.  Weight 186 pounds.  LUNGS:  Clear to auscultation bilaterally.  BACK:  No costovertebral angle tenderness.  CHEST:  Loop pocket is well healed without erythema or exudate.  HEART:  PMI not displaced or sustained. S1, S2 within normal limits.  No  S3, no S4.  No murmurs.  ABDOMEN:  Flat, positive bowel sounds, no rebound or guarding.  EXTREMITIES:  2+ pulses, no edema.   ASSESSMENT AND PLAN:  Syncope.  I doubt that the patient's  tachyarrhythmia is noted or related to her syncope.  She had no symptoms  with these.  Some of these appear to be sinus.  There is a question of  another rhythm (flutter versus SVT).  However, this is nonsustained and  not symptomatic.  Therefore, we will continue to monitor her with a loop  monitor.  She will let me know if she has any events.  Otherwise I will  see her back in September.     Rollene Rotunda, MD, [facility]  Electronically Signed    JH/MedQ  DD: 04/18/2006  DT: 04/18/2006  Job #: [id number]

## 2010-05-19 NOTE — Assessment & Plan Note (Signed)
[facility]                              CARDIOLOGY OFFICE NOTE   Cassie [name], Cassie [name]                        MRN:          [id number]  DATE:10/11/2005                            DOB:          1970/02/24    REASON FOR VISIT:  To evaluate the patient's syncope.   HISTORY OF PRESENT ILLNESS:  The patient returns for followup of the above.  She had a loop monitor placed on June 11, 2005.  She has had no further  presyncope or syncope.  She has had none of the symptoms that are  extensively described in previous notes.  She is under stress now dealing  with a child who is sick and trying to get a diagnosis for some medical  problems.   PAST MEDICAL HISTORY:  1. Syncope with extensive workup (see the consultation on March 24, 2005,      for details).  2. Hysterectomy.  3. Endometriosis.  4. Cholecystectomy.  5. Tonsillectomy.  6. Adenoidectomy.  7. Right breast lumpectomy.   ALLERGIES:  She could not tolerate PREDNISONE.   MEDICATIONS:  Cortisone 0.1 mg b.i.d. (the patient actually has not been  taking this as she has not felt that she needs it).   REVIEW OF SYSTEMS:  As stated in the HPI and positive for stress.   PHYSICAL EXAMINATION:  GENERAL:  The patient is in no distress.  VITAL SIGNS:  Blood pressure 136/83, heart rate 59 and regular.  LUNGS:  Clear to auscultation bilaterally.  CHEST:  The loop pocket is well-healed without erythema, exudate or  tenderness.  HEART:  PMI not displaced or sustained.  S1, S2 within normal limits.  No  S3, S4.  No murmurs.  ABDOMEN:  Flat, positive bowel sounds.  No rebound, no guarding.  Midline  pulsatile mass or organomegaly.  EXTREMITIES:  2+ pulses without edema.   ASSESSMENT/PLAN:  1. Syncope.  The patient has had no further events.  The monitor was      interrogated.  She will continue to wear this and let me know if she      has any symptoms.  We will do our routine      monitoring.  She will  remain off fludrocortisone as she is      asymptomatic.  2. Followup.  Will see her back in 6 months or sooner if needed.            ______________________________  Rollene Rotunda, MD, [facility]     JH/MedQ  DD:  10/11/2005  DT:  10/12/2005  Job #:  [id number]

## 2010-05-19 NOTE — Consult Note (Signed)
Cassie [name], [name]               ACCOUNT NO.:  [id number]   MEDICAL RECORD NO.:  [id number]          PATIENT TYPE:  OBV   LOCATION:  2002                         FACILITY:  [facility]   PHYSICIAN:  Rollene Rotunda, M.D.   DATE OF BIRTH:  1970-11-10   DATE OF CONSULTATION:  03/24/2005  DATE OF DISCHARGE:                                   CONSULTATION   PRIMARY CARE PHYSICIAN:  Beverely Risen, M.D.   CARDIOLOGIST:  Adrian Blackwater, M.D., in [place], [place].   REASON FOR REFERRAL:  Syncope.   REFERRING PHYSICIAN:  Corinna L. Lendell Caprice, M.D., with [facility]  group.   HISTORY OF PRESENT ILLNESS:  Cassie [name] is a very pleasant 40 year old  female patient who has a history of syncope.  She was evaluated after her  first episode of syncope, which was in the setting of rectal bleeding, back  in July 2006 at [facility].  At that time she underwent  stress testing and apparently had some tachyarrhythmias.  There was some  concern that she had coronary disease and was set up for cardiac  catheterization, which was done by Dr. Welton Flakes.  This revealed normal coronary  arteries.  She had normal LV function.  She was apparently also seen by  neurology.  She had negative carotid Dopplers.  She had an MRI that was  essentially normal.  She also saw a gastroenterologist at that time, who did  a colonoscopy which was normal.  The patient tells me that after this she  underwent a 30-day event monitor.  Apparently she had some fast rhythms.  She had been placed on Toprol prior to her discharge, and this was increased  to 50 mg a day, which she had been on for several months up until this  admission.  She tells me that she also had an echocardiogram sometime back  in July.  She was set up for a tilt table test at [facility], but that could not  be done and she wore a 24-hour loop monitor.  This was inconclusive.  The  patient had been told that she had orthostasis at the time that she  was  admitted back in July 2006.  She had been stable without any further  episodes of syncope up until March 22.  She had experienced episodes of  dizziness with standing.  Her episode of syncope back in July 2006 was  preceded by feelings of presyncope.   The patient was admitted to [facility] on March 22 after a second  episode of syncope.  The patient notes about three days ago she developed  substernal chest heaviness.  She could not get relief for this.  She had  seen her primary care physician, who had placed her on a proton pump  inhibitor.  She experienced no relief.  Apparently her EKG was fine and she  was sent over for a V/Q scan.  This was apparently inconclusive because the  patient was placed on Demerol for pain control.  She was concerned and  contacted her cardiologist.  Her cardiologist told her that  he felt it was  probably from acid reflux.  Later that afternoon while the patient was  standing cooking dinner for her children, she experienced her second  syncopal episode.  She had no warning with this.  She did not feel dizzy,  lightheaded.  She had been standing for quite some time before it occurred.  She denied any palpitations associated with it.  Prior to passing out she  had had one episode of emesis.  When she awoke, she was not disoriented.  Her children were with her.  There were no reports of any seizure activity,  loss of bowel or bladder control.   PAST MEDICAL HISTORY:  As noted above, significant for admission to [facility] in July 2006 with really negative workup with a normal  cardiac catheterization, normal colonoscopy and normal MRI and normal  carotid Dopplers.  She denies any history of diabetes mellitus,  hypertension, hyperlipidemia or thyroid disease.  She is status post  hysterectomy secondary to endometriosis, status post cholecystectomy, status  post tonsillectomy and adenoidectomy and status post right breast   lumpectomy.   MEDICATIONS PRIOR TO ADMISSION:  Toprol XL 50 mg daily.  Prilosec.  Zegerid.  She had recently been started on an appetite suppressant and has only taken  15 out of 30 pills.   Currently she is only on Protonix 20 mg a day.   ALLERGIES:  There is a reported history of an allergy to PREDNISONE.   SOCIAL HISTORY:  The patient lives in [place].  She works as a Education officer, environmental.  She is married with children.  She denies any tobacco or alcohol  abuse.   FAMILY HISTORY:  Significant for coronary disease in her father.  There is  no family history of sudden cardiac death.   REVIEW OF SYSTEMS:  Please see HPI.  She denies fever, chills, sweats.  She  has gained probably about 30-40 pounds over the last year.  She denies  headache, sore throat, skin changes, dysuria, hematuria, melena,  hematochezia, voice changes.  She has felt fatigued.  The rest of the review  of systems is negative.   PHYSICAL EXAMINATION:  GENERAL:  She is a well-nourished, well-developed  female in no acute distress.  VITAL SIGNS:  Blood pressure 102/62, pulse 62, respirations 20, temperature  98.3, oxygen saturation 97% on room air.  HEENT:  Head normocephalic, atraumatic.  Eyes:  PERRLA, EOMI.  Sclerae are  clear.  Oropharynx pink without exudate.  NECK:  Without lymphadenopathy.  No JVD.  Carotids without bruits  bilaterally.  ENDOCRINE:  No thyromegaly.  CARDIAC:  Normal S1, S2, regular rate and rhythm.  A 1-2/6 systolic ejection  murmur heard best at the right upper sternal border with questionable  increase with Valsalva.  LUNGS:  Clear to auscultation bilaterally.  SKIN:  Without rashes.  ABDOMEN:  Soft, nontender, with normoactive bowel sounds, no hepatomegaly.  EXTREMITIES:  Without clubbing, cyanosis, or edema.  MUSCULOSKELETAL:  Without spine or CVA tenderness.  NEUROLOGIC:  Nonfocal, alert and oriented x3.  Strength is 5/5 in all  extremity muscle groups.  Chest x-ray negative.   EKG:  Normal sinus rhythm, heart rate 66, normal  axes, no ischemic changes.   LABORATORY DATA:  Hemoglobin 12.4.  Sodium 136, potassium 3.5, BUN 5,  creatinine 0.9, glucose 112.  LFTs okay.  Point of care markers negative x2.  Total protein 5.8, albumin 3.6.  Chest CT negative for pulmonary embolism.  Urine drug screen  is negative.   IMPRESSION:  1.  Syncope.  2.  Noncardiac chest pain, suspect secondary to gastroesophageal reflux      disease/esophageal spasm.  3.  Normal coronary arteries by catheterization July 2006.  4.  Good left ventricular function.  5.  Systolic murmur.   PLAN:  The patient has most likely suffered vasovagal syncope.  She also has  symptoms of orthostatic hypotension.  The patient is also interviewed and  examined by Dr. Antoine Poche today.  The patient is stable for discharge to home  from our standpoint.  We will try to get the copy of her event monitor and  echocardiogram from Dr. Welton Flakes in [place].  We will set her up for a tilt  table test as an outpatient.  If we cannot get her echocardiogram, we will  arrange for that as well as an outpatient.  We agree with stopping her beta  blocker.  We have also asked her to stop caffeine intake.  She has been  advised to not drive until after her tilt table test has been completed.  She will need to follow up with her primary care physician for possible  GERD.  She is going to continue with liberal fluid replacement for her  orthostatic hypotension.  She will follow up with Dr. Antoine Poche after the  tilt table test and echocardiogram.      Tereso Newcomer, P.A.    ______________________________  Rollene Rotunda, M.D.    SW/MEDQ  D:  03/24/2005  T:  03/27/2005  Job:  [id number]   cc:   Corinna L. Lendell Caprice, MD

## 2010-05-19 NOTE — Op Note (Signed)
[name], [name]               ACCOUNT NO.:  [id number]   MEDICAL RECORD NO.:  [id number]          PATIENT TYPE:  OIB   LOCATION:  2899                         FACILITY:  [facility]   PHYSICIAN:  Rollene Rotunda, M.D.   DATE OF BIRTH:  12-Aug-1970   DATE OF PROCEDURE:  06/11/2005  DATE OF DISCHARGE:                                 OPERATIVE REPORT   PROCEDURE:  Loop recorder.   PRE-PROCEDURAL DIAGNOSIS:  Syncope.   POST-PROCEDURAL DIAGNOSIS:  Syncope.   PROCEDURAL NOTE:  The appropriate informed consent was obtained.  The  patient was educated and actually received the educational material,  including a DVD and booklet, weeks prior to the procedure.  We discussed the  risks that include infection and bleeding.  She understands that there will  be a small scar.  Appropriate informed consent was obtained.  The patient  was brought to the catheterization lab.  An area under the left subclavian  was mapped for maximal recording.  The left upper chest was then prepped and  draped in a sterile fashion.  The patient was anesthetized using 6 mg of  Versed and 75 mcg total of fentanyl.  She was continuously monitored with  good oxygen levels and normal blood pressure.  The skin was anesthetized  using 1% lidocaine, approximately 30 cc.  Using predominantly blunt  dissection and electrocautery, an incision was made through the skin and  carried down to the prepectoral fascia.  A pocket was formed.  I visually  achieved hemostasis using small amounts of electrocautery.  The pocket was  then copiously irrigated with kanamycin solution.  A Medtronic Reveal Plus,  model number [id number], serial number [id number], was inserted and affixed to the  prepectoral fascia using two silk sutures.  The pocket, with the device in  place, was then again copiously irrigated with kanamycin solution.  The  pocket was then closed using 2-0 Vicryl in a continuous vertical mattress,  followed by the same 2-0 Vicryl in a  continuous horizontal mattress.  The  subcuticular layer was a 4-0 Vicryl in a continuous horizontal stitch.  I  made sure to not leave any exposed stitching.  The wound was then prepped  and draped with benzoin solution and Steri-Strips.  The patient left the lab  in stable condition.  She will be educated again about how to use the  device.  The brady setting was 40 beats per minute, the tachy setting was  165 beats per minute.           ______________________________  Rollene Rotunda, M.D.     JH/MEDQ  D:  06/11/2005  T:  06/11/2005  Job:  [id number]   cc:   Dr. Beverely Risen
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
## 2010-07-11 ENCOUNTER — Ambulatory Visit: Payer: Self-pay | Admitting: General Surgery

## 2010-09-22 LAB — CBC
HCT: 37.7
Hemoglobin: 13.2
MCHC: 34.9
MCV: 94.4
Platelets: 198
RBC: 3.99
RDW: 13.1
WBC: 8.1

## 2010-09-22 LAB — PROTIME-INR
INR: 1
Prothrombin Time: 12.9

## 2011-02-22 ENCOUNTER — Other Ambulatory Visit: Payer: Self-pay | Admitting: Family Medicine

## 2011-02-22 DIAGNOSIS — Z0000 Encounter for general adult medical examination without abnormal findings: Secondary | ICD-10-CM

## 2011-02-23 ENCOUNTER — Other Ambulatory Visit (INDEPENDENT_AMBULATORY_CARE_PROVIDER_SITE_OTHER): Payer: BC Managed Care – PPO

## 2011-02-23 DIAGNOSIS — Z0000 Encounter for general adult medical examination without abnormal findings: Secondary | ICD-10-CM

## 2011-02-23 LAB — TSH: TSH: 0.83 u[IU]/mL (ref 0.35–5.50)

## 2011-02-23 LAB — BASIC METABOLIC PANEL
BUN: 14 mg/dL (ref 6–23)
CO2: 26 mEq/L (ref 19–32)
Calcium: 9.5 mg/dL (ref 8.4–10.5)
Chloride: 107 mEq/L (ref 96–112)
Creatinine, Ser: 1.1 mg/dL (ref 0.4–1.2)
GFR: 59.65 mL/min — ABNORMAL LOW (ref >60.00)
Glucose, Bld: 102 mg/dL — ABNORMAL HIGH (ref 70–99)
Potassium: 4.2 mEq/L (ref 3.5–5.1)
Sodium: 139 mEq/L (ref 135–145)

## 2011-02-23 LAB — LIPID PANEL
Cholesterol: 160 mg/dL (ref 0–200)
HDL: 50.6 mg/dL (ref >39.00)
LDL Cholesterol: 88 mg/dL (ref 0–99)
Total CHOL/HDL Ratio: 3
Triglycerides: 106 mg/dL (ref 0.0–149.0)
VLDL: 21.2 mg/dL (ref 0.0–40.0)

## 2011-02-27 ENCOUNTER — Encounter: Payer: Self-pay | Admitting: Family Medicine

## 2011-02-27 ENCOUNTER — Ambulatory Visit (INDEPENDENT_AMBULATORY_CARE_PROVIDER_SITE_OTHER): Payer: BC Managed Care – PPO | Admitting: Family Medicine

## 2011-02-27 VITALS — BP 122/80 | HR 84 | Temp 98.4°F | Ht 64.0 in | Wt 187.8 lb

## 2011-02-27 DIAGNOSIS — J329 Chronic sinusitis, unspecified: Secondary | ICD-10-CM

## 2011-02-27 DIAGNOSIS — Z0000 Encounter for general adult medical examination without abnormal findings: Secondary | ICD-10-CM

## 2011-02-27 DIAGNOSIS — Z0001 Encounter for general adult medical examination with abnormal findings: Secondary | ICD-10-CM | POA: Insufficient documentation

## 2011-02-27 DIAGNOSIS — Z808 Family history of malignant neoplasm of other organs or systems: Secondary | ICD-10-CM | POA: Insufficient documentation

## 2011-02-27 MED ORDER — AMOXICILLIN-POT CLAVULANATE 875-125 MG PO TABS: 1.0000 | ORAL_TABLET | Freq: Two times a day (BID) | ORAL | Status: AC

## 2011-02-27 NOTE — Patient Instructions (Signed)
You have a sinus infection. Take medicine as prescribed: augmentin twice daily for 10 days. Push fluids and plenty of rest. Nasal saline irrigation or neti pot to help drain sinuses. May use simple mucinex with plenty of fluid to help mobilize mucous. Let us know if fever >101.5, trouble opening/closing mouth, difficulty swallowing, or worsening - you may need to be seen again. Return as needed. For sugar - keep eye on fasting morning readings (every few weeks is ok).  If consistently >100, let me know.

## 2011-02-27 NOTE — Progress Notes (Signed)
Subjective:    Patient ID: Cassie [name], female    DOB: [birth year], 41 y.o.   MRN: [id number]  HPI CC: DOT, sinus congestion  Sinuses - 3 wk h/o sinus pressure headaches.  Then 3 d h/o sinus congestion, worsening HA, hoarse voice, ST, sinus drainage, + jaw pain.  + chills last night.  No fevers, chest pain, abd pain, n/v, no ear pain.  Has tried mucinex, pseudophed, dayquil.  36 yo sister recently diagnosed with melanoma.  Pt requests referral to derm for skin check.  H/o breast mass - observation currently.  f/u mammo scheduled 07/2010.  Followed by surgery Judeth Cornfield?) to see him in 1 year.  Preventative: Mammogram due 07/2011. Hysterectomy 2002 for endometriosis.  Ovaries remain.  Gets yearly check by [facility]. Well woman with OBGYN. Blood work reviewed. Flu shot - did not receive. Tetanus 2009  Medications and allergies reviewed and updated in chart.  Past histories reviewed and updated if relevant as below. Patient Active Problem List  Diagnoses  . OBESITY, UNSPECIFIED  . OTITIS MEDIA, SEROUS  . BREAST MASS, LEFT  . SYNCOPE, HX OF  . BREAST MASS, HX OF   Past Medical History  Diagnosis Date  . GERD (gastroesophageal reflux disease)   . Hx of migraines     occasional; improved since hysterectomy  . H/O syncope     cards work-up WNL (Hochrein) thought 2/2 overmedication vs arrhythmias  . Hx of endometriosis     hysterectomy   Past Surgical History  Procedure Date  . Laparoscopy 1994    endometriosis  . Cholecystectomy 2008  . Breast biopsy     multiple bilateral  . Breast mass excision 2008  . Breast biopsy 2010    left; core biopsy  . Tonsillectomy and adenoidectomy 1995  . Laparoscopic hysterectomy 2002    Dr. Lanice Schwab OBGYN   History  Substance Use Topics  . Smoking status: Former Games developer  . Smokeless tobacco: Never Used  . Alcohol Use: Yes     Socially   Family History  Problem Relation Age of Onset  . Breast cancer Mother   . Skin  cancer Mother   . Hypertension Mother   . Breast cancer      PGGM/GM-mastectomy  . Other Son     hyperinsulinemic  . Stroke      GM  . Coronary artery disease Father     age 77's  . Coronary artery disease Paternal Uncle     age 70's  . Cancer Sister 80    melanoma - cancer in eye and uterus   No Known Allergies No current outpatient prescriptions on file prior to visit.    Review of Systems  Constitutional: Negative for fever, chills, activity change, appetite change, fatigue and unexpected weight change.  HENT: Negative for hearing loss and neck pain.   Eyes: Negative for visual disturbance.  Respiratory: Positive for cough. Negative for chest tightness, shortness of breath and wheezing.   Cardiovascular: Negative for chest pain, palpitations and leg swelling.  Gastrointestinal: Negative for nausea, vomiting, abdominal pain, diarrhea, constipation, blood in stool and abdominal distention.  Genitourinary: Negative for hematuria and difficulty urinating.  Musculoskeletal: Negative for myalgias and arthralgias.  Skin: Negative for rash.  Neurological: Positive for headaches. Negative for dizziness, seizures and syncope.  Hematological: Does not bruise/bleed easily.  Psychiatric/Behavioral: Negative for dysphoric mood. The patient is not nervous/anxious.        Objective:   Physical Exam  Nursing note and vitals reviewed.  Constitutional: She is oriented to person, place, and time. She appears well-developed and well-nourished. No distress.  HENT:  Head: Normocephalic and atraumatic.  Right Ear: Hearing, tympanic membrane, external ear and ear canal normal.  Left Ear: Hearing, tympanic membrane, external ear and ear canal normal.  Nose: No mucosal edema or rhinorrhea. Right sinus exhibits maxillary sinus tenderness. Right sinus exhibits no frontal sinus tenderness. Left sinus exhibits maxillary sinus tenderness. Left sinus exhibits no frontal sinus tenderness.  Mouth/Throat:  Uvula is midline, oropharynx is clear and moist and mucous membranes are normal. No oropharyngeal exudate, posterior oropharyngeal edema, posterior oropharyngeal erythema or tonsillar abscesses.  Eyes: Conjunctivae and EOM are normal. Pupils are equal, round, and reactive to light. No scleral icterus.  Neck: Normal range of motion. Neck supple. No JVD present. No thyromegaly present.  Cardiovascular: Normal rate, regular rhythm, normal heart sounds and intact distal pulses.   No murmur heard. Pulses:      Radial pulses are 2+ on the right side, and 2+ on the left side.  Pulmonary/Chest: Effort normal and breath sounds normal. No respiratory distress. She has no wheezes. She has no rales.  Abdominal: Soft. Bowel sounds are normal. She exhibits no distension and no mass. There is no tenderness. There is no rebound and no guarding.  Musculoskeletal: Normal range of motion. She exhibits no edema.  Lymphadenopathy:    She has no cervical adenopathy.  Neurological: She is alert and oriented to person, place, and time.       CN grossly intact, station and gait intact  Skin: Skin is warm and dry. No rash noted.  Psychiatric: She has a normal mood and affect. Her behavior is normal. Judgment and thought content normal.      Assessment & Plan:

## 2011-02-27 NOTE — Progress Notes (Deleted)
  Subjective:    Patient ID: Cassie [name], female    DOB: 1970/08/19, 41 y.o.   MRN: [id number]  HPI    Review of Systems     Objective:   Physical Exam        Assessment & Plan:

## 2011-02-27 NOTE — Assessment & Plan Note (Signed)
Refer to derm for skin check per pt request.

## 2011-02-27 NOTE — Assessment & Plan Note (Signed)
Anticipate sinusitis, likely bacterial as off and on for 3 wks now. Treat with augmentin twice daily for 10 days, see pt instructions for furtehr recs.

## 2011-02-27 NOTE — Assessment & Plan Note (Addendum)
DOT CPE today. Reviewed form and asked to scan. Remote h/o syncope but unrevealing complete w/u 2009. utd tetanus. Well woman with OBGYN

## 2011-04-09 ENCOUNTER — Encounter: Payer: Self-pay | Admitting: Family Medicine

## 2011-07-12 ENCOUNTER — Ambulatory Visit: Payer: Self-pay | Admitting: General Surgery

## 2011-09-09 ENCOUNTER — Emergency Department: Payer: Self-pay | Admitting: Emergency Medicine

## 2012-08-06 ENCOUNTER — Ambulatory Visit: Payer: Self-pay | Admitting: General Surgery

## 2012-08-07 ENCOUNTER — Encounter: Payer: Self-pay | Admitting: General Surgery

## 2012-08-18 ENCOUNTER — Ambulatory Visit (INDEPENDENT_AMBULATORY_CARE_PROVIDER_SITE_OTHER): Payer: BC Managed Care – PPO | Admitting: General Surgery

## 2012-08-18 ENCOUNTER — Encounter: Payer: Self-pay | Admitting: General Surgery

## 2012-08-18 VITALS — BP 110/70 | HR 76 | Resp 12 | Ht 64.0 in | Wt 196.0 lb

## 2012-08-18 DIAGNOSIS — Z803 Family history of malignant neoplasm of breast: Secondary | ICD-10-CM | POA: Insufficient documentation

## 2012-08-18 DIAGNOSIS — N6019 Diffuse cystic mastopathy of unspecified breast: Secondary | ICD-10-CM | POA: Insufficient documentation

## 2012-08-18 NOTE — Progress Notes (Signed)
Patient ID: Cassie [name], female   DOB: 1970/08/25, 42 y.o.   MRN: [id number]  Chief Complaint  Patient presents with  . Other    mammogram    HPI Cassie [name] is a 42 y.o. female who presents for a breast evaluation. The most recent mammogram was done on 08/07/12 cat 2. Patient does perform regular self breast checks and gets regular mammograms done.    HPI  Past Medical History  Diagnosis Date  . GERD (gastroesophageal reflux disease)   . Hx of migraines     occasional; improved since hysterectomy  . H/O syncope     cards work-up WNL (Hochrein) thought 2/2 overmedication vs arrhythmias  . Hx of endometriosis     hysterectomy  . Guttate psoriasis   . Rosacea     mild  . Asthma   . Diffuse cystic mastopathy   . Cardiac disease     Past Surgical History  Procedure Laterality Date  . Laparoscopy  1994    endometriosis  . Cholecystectomy  2008  . Breast mass excision  2008  . Tonsillectomy and adenoidectomy  1995  . Laparoscopic hysterectomy  2002    Dr. Lanice Schwab OBGYN  . Colonoscopy  2006  . Cardiac catheterization  2007  . Abdominal hysterectomy    . Cardiac catheterization    . Tonsillectomy  1994  . Breast biopsy      multiple bilateral  . Breast biopsy  2010    left; core biopsy  . Breast surgery Right     excision of lesion    Family History  Problem Relation Age of Onset  . Breast cancer Mother   . Skin cancer Mother   . Hypertension Mother   . Breast cancer      PGGM/GM-mastectomy  . Other Son     hyperinsulinemic  . Stroke      GM  . Coronary artery disease Father     age 50's  . Coronary artery disease Paternal Uncle     age 68's  . Cancer Sister 75    melanoma - cancer in eye and uterus    Social History History  Substance Use Topics  . Smoking status: Never Smoker   . Smokeless tobacco: Never Used  . Alcohol Use: Yes     Comment: Socially    No Known Allergies  Current Outpatient Prescriptions  Medication Sig Dispense  Refill  . fish oil-omega-3 fatty acids 1000 MG capsule Take 3 g by mouth daily.      Marland Kitchen ibuprofen (ADVIL,MOTRIN) 200 MG tablet Take 200 mg by mouth as needed.      . Multiple Vitamin (MULTIVITAMIN) tablet Take 1 tablet by mouth daily.      . vitamin C (ASCORBIC ACID) 500 MG tablet Take 500 mg by mouth daily.       No current facility-administered medications for this visit.    Review of Systems Review of Systems  Constitutional: Negative.   Respiratory: Negative.   Cardiovascular: Negative.     Blood pressure 110/70, pulse 76, resp. rate 12, height 5\' 4"  (1.626 m), weight 196 lb (88.905 kg).  Physical Exam Physical Exam  Constitutional: She is oriented to person, place, and time. She appears well-developed and well-nourished.  Eyes: Conjunctivae are normal. No scleral icterus.  Neck: No thyromegaly present.  Cardiovascular: Normal rate, regular rhythm and normal heart sounds.   No murmur heard. Pulmonary/Chest: Effort normal and breath sounds normal. Right breast exhibits no inverted nipple, no mass, no  nipple discharge, no skin change and no tenderness. Left breast exhibits no inverted nipple, no mass, no nipple discharge, no skin change and no tenderness.  Abdominal: Soft. Normal appearance and bowel sounds are normal. There is no hepatosplenomegaly. There is no tenderness. No hernia.  Lymphadenopathy:    She has no cervical adenopathy.    She has no axillary adenopathy.  Neurological: She is alert and oriented to person, place, and time.  Skin: Skin is warm and dry.    Data Reviewed  Mammogram reviewed.   Assessment    Exam stable.      Plan    Patient to return in 1 year with bilateral screening mammogram.        Gerlene Burdock G 08/19/2012, 9:33 AM

## 2012-08-18 NOTE — Patient Instructions (Signed)
Patient to return in 1 year with bilateral screening mammogram.

## 2012-08-19 ENCOUNTER — Encounter: Payer: Self-pay | Admitting: General Surgery

## 2013-01-06 ENCOUNTER — Emergency Department: Payer: Self-pay | Admitting: Emergency Medicine

## 2013-01-08 ENCOUNTER — Encounter: Payer: Self-pay | Admitting: Family Medicine

## 2013-01-08 ENCOUNTER — Ambulatory Visit (INDEPENDENT_AMBULATORY_CARE_PROVIDER_SITE_OTHER): Payer: BC Managed Care – PPO | Admitting: Family Medicine

## 2013-01-08 VITALS — BP 104/70 | HR 95 | Temp 98.2°F | Ht 64.0 in | Wt 195.8 lb

## 2013-01-08 DIAGNOSIS — M542 Cervicalgia: Secondary | ICD-10-CM

## 2013-01-08 DIAGNOSIS — M25519 Pain in unspecified shoulder: Secondary | ICD-10-CM

## 2013-01-08 DIAGNOSIS — M549 Dorsalgia, unspecified: Secondary | ICD-10-CM

## 2013-01-08 DIAGNOSIS — M25512 Pain in left shoulder: Secondary | ICD-10-CM

## 2013-01-08 MED ORDER — CYCLOBENZAPRINE HCL 10 MG PO TABS: 10.0000 mg | ORAL_TABLET | Freq: Three times a day (TID) | ORAL | Status: DC | PRN

## 2013-01-08 MED ORDER — OXYCODONE-ACETAMINOPHEN 5-325 MG PO TABS: 5.0000 | ORAL_TABLET | Freq: Four times a day (QID) | ORAL | Status: DC | PRN

## 2013-01-08 NOTE — Patient Instructions (Signed)
F/u 2-3 weeks

## 2013-01-08 NOTE — Progress Notes (Signed)
Pre-visit discussion using our clinic review tool. No additional management support is needed unless otherwise documented below in the visit note.  
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
## 2013-01-09 DIAGNOSIS — E669 Obesity, unspecified: Secondary | ICD-10-CM | POA: Insufficient documentation

## 2013-01-09 DIAGNOSIS — E66811 Obesity, class 1: Secondary | ICD-10-CM | POA: Insufficient documentation

## 2013-01-22 ENCOUNTER — Telehealth: Payer: Self-pay

## 2013-01-22 MED ORDER — TRAMADOL HCL 50 MG PO TABS: 50.0000 mg | ORAL_TABLET | Freq: Four times a day (QID) | ORAL | Status: DC | PRN

## 2013-01-22 NOTE — Telephone Encounter (Signed)
Left message for Cassie [name] at home number that a prescription for Tramadol has been called to her pharmacy.  Advised Dr. Lorelei Pont recommends   Taking it along with Tylenol or Ibuprofen.

## 2013-01-22 NOTE — Telephone Encounter (Signed)
Most people can tolerate this and would take along with tylenol and nsaid  Tramadol 50 mg, 1 po q 6 hours prn pain, #50, 2 refills

## 2013-01-22 NOTE — Telephone Encounter (Signed)
Pt was seen 01/08/13; pt has f/u appt 01/26/13 but pt is trying to work and having a lot of burning pain in neck and shoulder while at work; pt cannot take oxycodone and flexeril at work because it makes her sleepy. Pt taking Ibuprofen 800 mg q6h which is not relieving the pain during the day. Pt is not taking Tylenol 1000 mg q 6 h as suggested in 01/08/13 note. Pt wants to know what pt can take for pain during the day until rechecked on 01/26 so pt can continue to work. [facility]. Pt request cb.

## 2013-01-22 NOTE — Telephone Encounter (Signed)
Tramadol called to [facility]. Left message for patient to return my call.

## 2013-01-26 ENCOUNTER — Encounter: Payer: Self-pay | Admitting: Family Medicine

## 2013-01-26 ENCOUNTER — Ambulatory Visit: Payer: BC Managed Care – PPO | Admitting: Family Medicine

## 2013-01-26 ENCOUNTER — Ambulatory Visit (INDEPENDENT_AMBULATORY_CARE_PROVIDER_SITE_OTHER): Payer: BC Managed Care – PPO | Admitting: Family Medicine

## 2013-01-26 VITALS — BP 110/76 | HR 92 | Temp 98.2°F | Ht 65.25 in | Wt 198.0 lb

## 2013-01-26 DIAGNOSIS — M25519 Pain in unspecified shoulder: Secondary | ICD-10-CM

## 2013-01-26 DIAGNOSIS — M542 Cervicalgia: Secondary | ICD-10-CM

## 2013-01-26 DIAGNOSIS — M25512 Pain in left shoulder: Secondary | ICD-10-CM

## 2013-01-26 NOTE — Progress Notes (Signed)
Pre-visit discussion using our clinic review tool. No additional management support is needed unless otherwise documented below in the visit note.  
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
## 2013-01-26 NOTE — Patient Instructions (Signed)
REFERRALS TO SPECIALISTS, SPECIAL TESTS (MRI, CT, ULTRASOUNDS)  GO THE WAITING ROOM AND TELL CHECK IN YOU NEED HELP WITH A REFERRAL. Either MARION or LINDA will help you set it up.  If it is between 1-2 PM they may be at lunch.  After 5 PM, they will likely be at home.  They will call you, so please make sure the office has your correct phone number.  Referrals sometimes can be done same day if urgent, but others can take 2 or 3 days to get an appointment. Starting in 2015, some of the new Medicare insurance plans and [employer] plans offered on the Exchange take longer for referrals. They have added additional paperwork and steps.  MRI's and CT's can take up to a week for the test. (Emergencies like strokes take precedence. I will tell you if you have an emergency.)   Specialist appointment times vary a great deal, mostly on the specialist's schedule and if they have openings. -- Our office tries to get you in as fast as possible. -- Some specialists have very long wait times. (Example. Dermatology. Usually months) -- If you have a true emergency like new cancer, we work to get you in ASAP.   

## 2013-01-28 ENCOUNTER — Ambulatory Visit
Admission: RE | Admit: 2013-01-28 | Discharge: 2013-01-28 | Disposition: A | Discharge status: Home / Self Care | Payer: BC Managed Care – PPO | Source: Ambulatory Visit | Attending: Family Medicine | Admitting: Family Medicine

## 2013-01-28 DIAGNOSIS — M25512 Pain in left shoulder: Secondary | ICD-10-CM

## 2013-02-11 ENCOUNTER — Ambulatory Visit (INDEPENDENT_AMBULATORY_CARE_PROVIDER_SITE_OTHER): Payer: BC Managed Care – PPO | Admitting: Family Medicine

## 2013-02-11 ENCOUNTER — Encounter: Payer: Self-pay | Admitting: Family Medicine

## 2013-02-11 VITALS — BP 114/74 | HR 97 | Temp 98.4°F | Ht 65.25 in | Wt 199.8 lb

## 2013-02-11 DIAGNOSIS — M67919 Unspecified disorder of synovium and tendon, unspecified shoulder: Secondary | ICD-10-CM

## 2013-02-11 DIAGNOSIS — M7500 Adhesive capsulitis of unspecified shoulder: Secondary | ICD-10-CM

## 2013-02-11 DIAGNOSIS — M719 Bursopathy, unspecified: Secondary | ICD-10-CM

## 2013-02-11 DIAGNOSIS — M542 Cervicalgia: Secondary | ICD-10-CM

## 2013-02-11 DIAGNOSIS — M7580 Other shoulder lesions, unspecified shoulder: Secondary | ICD-10-CM
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
## 2013-02-11 NOTE — Patient Instructions (Signed)
F/u 5 weeks  REFERRALS TO SPECIALISTS, SPECIAL TESTS (MRI, CT, ULTRASOUNDS)  GO THE WAITING ROOM AND TELL CHECK IN YOU NEED HELP WITH A REFERRAL. Either MARION or LINDA will help you set it up.  If it is between 1-2 PM they may be at lunch.  After 5 PM, they will likely be at home.  They will call you, so please make sure the office has your correct phone number.  Referrals sometimes can be done same day if urgent, but others can take 2 or 3 days to get an appointment. Starting in 2015, some of the new Medicare insurance plans and [place] offered on the Exchange take longer for referrals. They have added additional paperwork and steps.  MRI's and CT's can take up to a week for the test. (Emergencies like strokes take precedence. I will tell you if you have an emergency.)   Specialist appointment times vary a great deal, mostly on the specialist's schedule and if they have openings. -- Our office tries to get you in as fast as possible. -- Some specialists have very long wait times. (Example. Dermatology. Usually months) -- If you have a true emergency like new cancer, we work to get you in ASAP.

## 2013-02-11 NOTE — Progress Notes (Signed)
Pre-visit discussion using our clinic review tool. No additional management support is needed unless otherwise documented below in the visit note.  

## 2013-03-18 ENCOUNTER — Ambulatory Visit: Payer: BC Managed Care – PPO | Admitting: Family Medicine

## 2013-04-22 ENCOUNTER — Ambulatory Visit (INDEPENDENT_AMBULATORY_CARE_PROVIDER_SITE_OTHER): Payer: BC Managed Care – PPO | Admitting: Family Medicine

## 2013-04-22 ENCOUNTER — Encounter: Payer: Self-pay | Admitting: Family Medicine

## 2013-04-22 VITALS — BP 118/80 | HR 108 | Temp 98.5°F | Ht 65.25 in | Wt 195.8 lb

## 2013-04-22 DIAGNOSIS — M25519 Pain in unspecified shoulder: Secondary | ICD-10-CM

## 2013-04-22 DIAGNOSIS — M67919 Unspecified disorder of synovium and tendon, unspecified shoulder: Secondary | ICD-10-CM

## 2013-04-22 DIAGNOSIS — M25512 Pain in left shoulder: Secondary | ICD-10-CM

## 2013-04-22 DIAGNOSIS — M719 Bursopathy, unspecified: Secondary | ICD-10-CM

## 2013-04-22 DIAGNOSIS — M7580 Other shoulder lesions, unspecified shoulder: Secondary | ICD-10-CM

## 2013-04-22 DIAGNOSIS — M7500 Adhesive capsulitis of unspecified shoulder: Secondary | ICD-10-CM

## 2013-04-22 NOTE — Progress Notes (Signed)
Pre visit review using our clinic review tool, if applicable. No additional management support is needed unless otherwise documented below in the visit note. 
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
## 2013-04-28 ENCOUNTER — Ambulatory Visit (INDEPENDENT_AMBULATORY_CARE_PROVIDER_SITE_OTHER): Payer: BC Managed Care – PPO | Admitting: Family Medicine

## 2013-04-28 ENCOUNTER — Encounter: Payer: Self-pay | Admitting: Family Medicine

## 2013-04-28 VITALS — BP 124/76 | HR 76 | Temp 98.3°F | Wt 192.2 lb

## 2013-04-28 DIAGNOSIS — R198 Other specified symptoms and signs involving the digestive system and abdomen: Secondary | ICD-10-CM

## 2013-04-28 DIAGNOSIS — R194 Change in bowel habit: Secondary | ICD-10-CM

## 2013-04-28 DIAGNOSIS — R5381 Other malaise: Secondary | ICD-10-CM

## 2013-04-28 DIAGNOSIS — R5383 Other fatigue: Secondary | ICD-10-CM

## 2013-04-28 LAB — COMPREHENSIVE METABOLIC PANEL
ALT: 18 U/L (ref 0–35)
AST: 15 U/L (ref 0–37)
Albumin: 4.4 g/dL (ref 3.5–5.2)
Alkaline Phosphatase: 61 U/L (ref 39–117)
BUN: 18 mg/dL (ref 6–23)
CO2: 24 mEq/L (ref 19–32)
Calcium: 9.4 mg/dL (ref 8.4–10.5)
Chloride: 104 mEq/L (ref 96–112)
Creatinine, Ser: 0.9 mg/dL (ref 0.4–1.2)
GFR: 70.84 mL/min (ref >60.00)
Glucose, Bld: 70 mg/dL (ref 70–99)
Potassium: 3.7 mEq/L (ref 3.5–5.1)
Sodium: 138 mEq/L (ref 135–145)
Total Bilirubin: 0.5 mg/dL (ref 0.3–1.2)
Total Protein: 7.4 g/dL (ref 6.0–8.3)

## 2013-04-28 LAB — CBC WITH DIFFERENTIAL/PLATELET
Basophils Absolute: 0 10*3/uL (ref 0.0–0.1)
Basophils Relative: 0.5 % (ref 0.0–3.0)
Eosinophils Absolute: 0.1 10*3/uL (ref 0.0–0.7)
Eosinophils Relative: 1.5 % (ref 0.0–5.0)
HCT: 41.1 % (ref 36.0–46.0)
Hemoglobin: 14.2 g/dL (ref 12.0–15.0)
Lymphocytes Relative: 22.4 % (ref 12.0–46.0)
Lymphs Abs: 1.7 10*3/uL (ref 0.7–4.0)
MCHC: 34.5 g/dL (ref 30.0–36.0)
MCV: 96.4 fl (ref 78.0–100.0)
Monocytes Absolute: 0.5 10*3/uL (ref 0.1–1.0)
Monocytes Relative: 6.1 % (ref 3.0–12.0)
Neutro Abs: 5.4 10*3/uL (ref 1.4–7.7)
Neutrophils Relative %: 69.5 % (ref 43.0–77.0)
Platelets: 226 10*3/uL (ref 150.0–400.0)
RBC: 4.26 Mil/uL (ref 3.87–5.11)
RDW: 12.6 % (ref 11.5–14.6)
WBC: 7.8 10*3/uL (ref 4.5–10.5)

## 2013-04-28 LAB — TSH: TSH: 0.46 u[IU]/mL (ref 0.35–5.50)

## 2013-04-28 NOTE — Progress Notes (Signed)
Pre visit review using our clinic review tool, if applicable. No additional management support is needed unless otherwise documented below in the visit note. 

## 2013-04-28 NOTE — Patient Instructions (Addendum)
Sign release for record of colonoscopy from [facility] ~2006. blood work today and stool kit today. Back down on fiber .  Continue miralax 1 - 2 capfuls daily.  Start milk of magnesia 15 cc once or twice daily as needed for constipation for 3-5 days.  Continue healthy diet as up to now. We will likely refer you back to [facility] GI doctors for further evaluation in history of colon polyp but will await latest colonoscopy.

## 2013-04-28 NOTE — Assessment & Plan Note (Signed)
Deteriorated constipation over last 1.5 months along with fatigue despite overall healthy diet.   ?tarry stools and some blood with wiping in h/o hemorrhoids. H/o colon polyp, may be due for rpt colonoscopy - I have asked for records of latest colonoscopy. Will check CBC, CMP, and TSH along with iFOB today. Discussed decreasing fiber to hopefully help bloating/gassiness sxs and starting miralax bid + MOM daily to bid prn constipation. Continue significant water intake.  Pt agrees with plan. ?diverticulosis vs idiopathic constipation vs polyp/tumor.  No recent narcotic use.

## 2013-04-28 NOTE — Progress Notes (Signed)
BP 124/76  Pulse 76  Temp(Src) 98.3 F (36.8 C) (Oral)  Wt 192 lb 4 oz (87.204 kg)  LMP 01/27/2000   CC: discuss constipation with bm changes Subjective:    Patient ID: Cassie [name], female    DOB: Mar 28, 1970, 43 y.o.   MRN: [id number]  HPI: Cassie [name] is a 43 y.o. female presenting on 04/28/2013 for Constipation   For last 6-8 wks has been working on weight with healthy diet.  Constipation with bowel changes have worsened since then.  Prior having intermittent episodes.  Now for last 6 wks taking miralax + fiber pill + aloe in water, drinking good amt water 128 oz water plus other liquids, eating salads and apples.  On Sunday took dulcolax which helped her stool x1.  Prior 1 soft stool a day, now over last 1+ month 1 hard stool every 3-4 days.  Endorses lethargy and fatigue, some abdominal discomfort, bloating, increased gassiness. Some hemorrhoids. Endorses bowel changes - pebbles and tarry stool.  Some red blood with wiping.  Brings food journal from the last month - no fast food, no sweet tea.  Denies fever/schills, joint pains, blood in stool or urine, nausea/vomiting, diarrhea, heat/cold intolerance, skin or hair changes.  Denies dyspnea, lighteadedness or dizziness or headache. No colon cancer in family.  + melanoma (in eye and uterus) and breast cancer in family (sister, mother).  Colonoscopy last done at [facility] 2006 after admission for syncope per patient but no records of this in our chart.  States had colon polyp ?due for rpt.  Unsure who she saw.  Wt Readings from Last 3 Encounters:  04/28/13 192 lb 4 oz (87.204 kg)  04/22/13 195 lb 12 oz (88.792 kg)  02/11/13 199 lb 12 oz (90.606 kg)  Body mass index is 31.76 kg/(m^2).   Recent eval by Dr. Lorelei Pont for frozen shoulder with RTC tendonitis s/p steroid injections.  Prior on pain meds for shoulder, not recently.  Only taking ibuprofen prn.  Relevant past medical, surgical, family and social history reviewed and  updated as indicated.  Allergies and medications reviewed and updated. Current Outpatient Prescriptions on File Prior to Visit  Medication Sig  . fish oil-omega-3 fatty acids 1000 MG capsule Take 3 g by mouth daily.  Marland Kitchen ibuprofen (ADVIL,MOTRIN) 200 MG tablet Take 200 mg by mouth as needed.  . Multiple Vitamin (MULTIVITAMIN) tablet Take 1 tablet by mouth daily.  . vitamin C (ASCORBIC ACID) 500 MG tablet Take 500 mg by mouth daily.   No current facility-administered medications on file prior to visit.    Review of Systems Per HPI unless specifically indicated above    Objective:    BP 124/76  Pulse 76  Temp(Src) 98.3 F (36.8 C) (Oral)  Wt 192 lb 4 oz (87.204 kg)  LMP 01/27/2000  Physical Exam  Nursing note and vitals reviewed. Constitutional: She appears well-developed and well-nourished. No distress.  HENT:  Mouth/Throat: Oropharynx is clear and moist. No oropharyngeal exudate.  Eyes: Conjunctivae and EOM are normal. Pupils are equal, round, and reactive to light.  Cardiovascular: Normal rate, regular rhythm, normal heart sounds and intact distal pulses.   No murmur heard. Pulmonary/Chest: Effort normal and breath sounds normal. No respiratory distress. She has no wheezes.  Abdominal: Soft. Normal appearance and bowel sounds are normal. She exhibits no distension and no mass. There is no hepatosplenomegaly. There is tenderness (mild to deep palpation) in the suprapubic area and left lower quadrant. There is no rigidity, no rebound,  no guarding and negative Murphy's sign.  Musculoskeletal: She exhibits no edema.  Skin: Skin is warm and dry. No rash noted.  Psychiatric: She has a normal mood and affect.   Results for orders placed in visit on 02/27/11  HM MAMMOGRAPHY      Result Value Ref Range   HM Mammogram Birads 2        Assessment & Plan:   Problem List Items Addressed This Visit   Bowel habit changes - Primary     Deteriorated constipation over last 1.5 months along  with fatigue despite overall healthy diet.   ?tarry stools and some blood with wiping in h/o hemorrhoids. H/o colon polyp, may be due for rpt colonoscopy - I have asked for records of latest colonoscopy. Will check CBC, CMP, and TSH along with iFOB today. Discussed decreasing fiber to hopefully help bloating/gassiness sxs and starting miralax bid + MOM daily to bid prn constipation. Continue significant water intake.  Pt agrees with plan. ?diverticulosis vs idiopathic constipation vs polyp/tumor.  No recent narcotic use.    Relevant Orders      TSH      Fecal occult blood, imunochemical    Other Visit Diagnoses   Fatigue        Relevant Orders       TSH       Comprehensive metabolic panel       CBC with Differential        Follow up plan: Return if symptoms worsen or fail to improve.

## 2013-04-30 ENCOUNTER — Encounter: Payer: Self-pay | Admitting: Family Medicine

## 2013-05-11 ENCOUNTER — Other Ambulatory Visit: Payer: BC Managed Care – PPO

## 2013-05-11 ENCOUNTER — Encounter: Payer: Self-pay | Admitting: *Deleted

## 2013-05-11 DIAGNOSIS — R194 Change in bowel habit: Secondary | ICD-10-CM

## 2013-05-11 LAB — FECAL OCCULT BLOOD, GUAIAC
Fecal Occult Blood: NEGATIVE
Fecal Occult Blood: NEGATIVE

## 2013-05-11 LAB — FECAL OCCULT BLOOD, IMMUNOCHEMICAL: Fecal Occult Bld: NEGATIVE

## 2013-05-19 ENCOUNTER — Other Ambulatory Visit: Payer: Self-pay | Admitting: Family Medicine

## 2013-05-19 DIAGNOSIS — R5383 Other fatigue: Secondary | ICD-10-CM

## 2013-05-19 DIAGNOSIS — R194 Change in bowel habit: Secondary | ICD-10-CM

## 2013-05-21 ENCOUNTER — Encounter: Payer: Self-pay | Admitting: Internal Medicine

## 2013-06-01 HISTORY — PX: COLONOSCOPY: SHX174

## 2013-06-03 ENCOUNTER — Encounter: Payer: Self-pay | Admitting: Physician Assistant

## 2013-06-03 ENCOUNTER — Ambulatory Visit (INDEPENDENT_AMBULATORY_CARE_PROVIDER_SITE_OTHER): Payer: BC Managed Care – PPO | Admitting: Physician Assistant

## 2013-06-03 VITALS — BP 110/70 | HR 70 | Ht 65.25 in | Wt 193.8 lb

## 2013-06-03 DIAGNOSIS — K5900 Constipation, unspecified: Secondary | ICD-10-CM

## 2013-06-03 DIAGNOSIS — K625 Hemorrhage of anus and rectum: Secondary | ICD-10-CM

## 2013-06-03 DIAGNOSIS — R198 Other specified symptoms and signs involving the digestive system and abdomen: Secondary | ICD-10-CM

## 2013-06-03 DIAGNOSIS — R194 Change in bowel habit: Secondary | ICD-10-CM

## 2013-06-03 DIAGNOSIS — K5909 Other constipation: Secondary | ICD-10-CM

## 2013-06-03 MED ORDER — LINACLOTIDE 145 MCG PO CAPS: 145.0000 ug | ORAL_CAPSULE | Freq: Every day | ORAL | Status: DC

## 2013-06-03 NOTE — Progress Notes (Signed)
Agree with initial assessment and plans as outlined 

## 2013-06-03 NOTE — Patient Instructions (Addendum)
Amy Suggests between now the the day before the procedure, you can take 2-3 doses of Miralax daily to help purge your bowels.  If you are home some evening and not going anywhere, you can do do 4-5 doses you can.    You have been scheduled for a colonoscopy with propofol. Please follow written instructions given to you at your visit today.  We have given you a sample prep. If you use inhalers (even only as needed), please bring them with you on the day of your procedure. Your physician has requested that you go to www.startemmi.com and enter the access code given to you at your visit today. This web site gives a general overview about your procedure. However, you should still follow specific instructions given to you by our office regarding your preparation for the procedure.  We sent a prescription for Linzess 145 mcg, take 1 tab daily.  We sent it to [place], [place]. We have given you 5 days worth of a probiotic sample Restora.. You can get these over the counter at [facility] or [facility].

## 2013-06-03 NOTE — Progress Notes (Signed)
Subjective:    Patient ID: Cassie [name], female    DOB: Aug 18, 1970, 43 y.o.   MRN: [id number]  HPI  [facility] is a pleasant 43 year old white female new to GI today. She is referred by Dr. Danise Mina for complaints of changes in her bowel habits. Patient has had prior colonoscopy done in 2006 by Dr. Clydene Fake for in [place] and this was a normal exam. We do have a copy of that report. Patient also relates that she had one  colonoscopy also done in [place] prior to that. She has had mild chronic problems with constipation and says her usual bowel pattern had been to have a bowel movement every 2-3 days. Now over the past 3 months she says she has had progressive problems with constipation and is going 7-10 days between bowel movements. She says she gets very uncomfortable and has had some g bloating fatigue etc. She was seen by primary care within the past week and TSH was normal at 0.46 stool is documented Hemoccult negative. Patient has been using MiraLax on a regular basis Dulcolax periodically and has been taking fiber supplements as well -but remains quite constipated. She says with had hard straining she may see some bright red blood on the tissue. She has been trying to lose weight but has been eating a healthy diet with a lot of fiber and denies using any diet pills supplements etc. Family history is negative for colon cancer or polyps. She says she does worry because she had a sister-in-law die from colon cancer.    Review of Systems  Constitutional: Negative.   HENT: Negative.   Eyes: Negative.   Respiratory: Negative.   Cardiovascular: Negative.   Gastrointestinal: Positive for constipation, abdominal distention and anal bleeding.  Endocrine: Negative.   Genitourinary: Negative.   Musculoskeletal: Negative.   Skin: Negative.   Allergic/Immunologic: Negative.   Neurological: Negative.   Hematological: Negative.   Psychiatric/Behavioral: Negative.    Outpatient Prescriptions  Prior to Visit  Medication Sig Dispense Refill  . b complex vitamins tablet Take 1 tablet by mouth daily.      . Bisacodyl (DULCOLAX PO) Take by mouth as needed.      Marland Kitchen FIBER PO Take by mouth daily.      . fish oil-omega-3 fatty acids 1000 MG capsule Take 3 g by mouth daily.      Marland Kitchen ibuprofen (ADVIL,MOTRIN) 200 MG tablet Take 200 mg by mouth as needed.      . Multiple Vitamin (MULTIVITAMIN) tablet Take 1 tablet by mouth daily.      . polyethylene glycol (MIRALAX / GLYCOLAX) packet Take 17 g by mouth daily.      . vitamin C (ASCORBIC ACID) 500 MG tablet Take 500 mg by mouth daily.       No facility-administered medications prior to visit.   No Known Allergies Patient Active Problem List   Diagnosis Date Noted  . Bowel habit changes 04/28/2013  . Obesity (BMI 30-39.9) 01/09/2013  . Diffuse cystic mastopathy 08/18/2012  . Family history of breast cancer 08/18/2012  . Healthcare maintenance 02/27/2011  . Family history of melanoma 02/27/2011  . BREAST MASS, LEFT 08/11/2009  . SYNCOPE, HX OF 08/11/2009  . BREAST MASS, HX OF 08/11/2009   History  Substance Use Topics  . Smoking status: Never Smoker   . Smokeless tobacco: Never Used  . Alcohol Use: Yes     Comment: Socially   family history includes Breast cancer in her maternal grandmother, mother,  paternal aunt, and another family member; Coronary artery disease in her father and paternal uncle; Diabetes in her maternal grandfather and paternal uncle; Epilepsy in her son; Hypertension in her mother; Irritable bowel syndrome in her daughter and mother; Other in her son; Skin cancer in her mother; Skin cancer (age of onset: 51) in her sister; Stroke in her maternal grandmother; Thyroid disease in her sister. There is no history of Colon cancer.     Objective:   Physical Exam  well-developed white female in no acute distress, pleasant blood pressure 110/70 pulse 70 height 5 foot 5 weight 193. HEENT; nontraumatic normocephalic EOMI PERRLA  sclera anicteric, Supple ;no JVD, Cardiovascular; regular rate and rhythm with S1-S2 no murmur or gallop, Pulm;clear bilaterally, Abdomen; soft, mild tenderness left abdomen tenderness no palpable mass or hepatosplenomegaly no guarding bowel sounds are present, Rectal ;exam not done, Extremities ;no clubbing cyanosis or edema skin warm and dry, Psych ;mood and affect appropriate        Assessment & Plan:  #42  43 year old female with new onset severe constipation x3 months, probably functional but cannot rule out occult lesion #2 intermittent scant hematochezia likely local anal rectal secondary to straining  #3 obesity  Plan; will schedule for colonoscopy with Dr. Henrene Pastor. Procedure discussed in detail with patient and she is agreeable to proceed. She will continue MiraLax and have asked her to purge her bowel by using 4-6 doses of MiraLax in one sitting and then go to MiraLax twice daily in the short-term Start probiotic, she was given samples of Restora  today Start linzess. 145 mcg by mouth daily. The patient was given samples and prescription sent. She  is also encouraged to continue liberal oral fluids and to exercise on a daily basis Further plans pending findings at colonoscopy

## 2013-06-09 ENCOUNTER — Ambulatory Visit (AMBULATORY_SURGERY_CENTER): Payer: BC Managed Care – PPO | Admitting: Internal Medicine

## 2013-06-09 ENCOUNTER — Encounter: Payer: Self-pay | Admitting: Internal Medicine

## 2013-06-09 VITALS — BP 118/87 | HR 62 | Temp 98.0°F | Resp 20 | Ht 65.0 in | Wt 193.0 lb

## 2013-06-09 DIAGNOSIS — K625 Hemorrhage of anus and rectum: Secondary | ICD-10-CM

## 2013-06-09 DIAGNOSIS — R198 Other specified symptoms and signs involving the digestive system and abdomen: Secondary | ICD-10-CM

## 2013-06-09 DIAGNOSIS — K5909 Other constipation: Secondary | ICD-10-CM

## 2013-06-09 DIAGNOSIS — R194 Change in bowel habit: Secondary | ICD-10-CM

## 2013-06-09 DIAGNOSIS — K5900 Constipation, unspecified: Secondary | ICD-10-CM

## 2013-06-09 MED ORDER — SODIUM CHLORIDE 0.9 % IV SOLN: 500.0000 mL | INTRAVENOUS | Status: DC

## 2013-06-09 NOTE — Op Note (Signed)
[place]  [address] [place], [id number]   COLONOSCOPY PROCEDURE REPORT  PATIENT: [place], Sche[place]  MR#: [id number] BIRTHDATE: Mar 04, 1970 , 42  yrs. old GENDER: Female ENDOSCOPIST: Eustace Quail, MD REFERRED EX:HBZJIR Gutierrez, M.D. PROCEDURE DATE:  06/09/2013 PROCEDURE:   Colonoscopy, diagnostic First Screening Colonoscopy - Avg.  risk and is 50 yrs.  old or older - No.  Prior Negative Screening - Now for repeat screening. N/A  History of Adenoma - Now for follow-up colonoscopy & has been > or = to 3 yrs.  N/A  Polyps Removed Today? No.  Recommend repeat exam, <10 yrs? No. ASA CLASS:   Class II INDICATIONS:Change in bowel habits, Constipation, and rectal bleeding. MEDICATIONS: MAC sedation, administered by CRNA and propofol (Diprivan) 400mg  IV  DESCRIPTION OF PROCEDURE:   After the risks benefits and alternatives of the procedure were thoroughly explained, informed consent was obtained.  A digital rectal exam revealed no abnormalities of the rectum.   The LB [id number]  endoscope was introduced through the anus and advanced to the cecum, which was identified by both the appendix and ileocecal valve. No adverse events experienced.   The quality of the prep was excellent, using MoviPrep  The instrument was then slowly withdrawn as the colon was fully examined.  COLON FINDINGS: Moderate diverticulosis was noted throughout the colon.   The colon was otherwise normal.  There was no inflammation, polyps or cancers unless previously stated. Retroflexed views revealed internal hemorrhoids. The time to cecum=3 minutes 16 seconds.  Withdrawal time=9 minutes 11 seconds. The scope was withdrawn and the procedure completed. COMPLICATIONS: There were no complications.  ENDOSCOPIC IMPRESSION: 1.   Moderate diverticulosis was noted throughout the colon 2.   The colon was otherwise normal  RECOMMENDATIONS: 1.  Continue current medications 2.  Continue  current colorectal screening recommendations for "routine risk" patients with a repeat colonoscopy in 10 years.   eSigned:  Eustace Quail, MD 06/09/2013 8:38 AM   cc: Ria Bush MD and The Patient

## 2013-06-09 NOTE — Patient Instructions (Signed)
YOU HAD AN ENDOSCOPIC PROCEDURE TODAY AT THE [facility]: Refer to the procedure report that was given to you for any specific questions about what was found during the examination.  If the procedure report does not answer your questions, please call your gastroenterologist to clarify.  If you requested that your care partner not be given the details of your procedure findings, then the procedure report has been included in a sealed envelope for you to review at your convenience later.  YOU SHOULD EXPECT: Some feelings of bloating in the abdomen. Passage of more gas than usual.  Walking can help get rid of the air that was put into your GI tract during the procedure and reduce the bloating. If you had a lower endoscopy (such as a colonoscopy or flexible sigmoidoscopy) you may notice spotting of blood in your stool or on the toilet paper. If you underwent a bowel prep for your procedure, then you may not have a normal bowel movement for a few days.  DIET: Your first meal following the procedure should be a light meal and then it is ok to progress to your normal diet.  A half-sandwich or bowl of soup is an example of a good first meal.  Heavy or fried foods are harder to digest and may make you feel nauseous or bloated.  Likewise meals heavy in dairy and vegetables can cause extra gas to form and this can also increase the bloating.  Drink plenty of fluids but you should avoid alcoholic beverages for 24 hours.  ACTIVITY: Your care partner should take you home directly after the procedure.  You should plan to take it easy, moving slowly for the rest of the day.  You can resume normal activity the day after the procedure however you should NOT DRIVE or use heavy machinery for 24 hours (because of the sedation medicines used during the test).    SYMPTOMS TO REPORT IMMEDIATELY: A gastroenterologist can be reached at any hour.  During normal business hours, 8:30 AM to 5:00 PM Monday through Friday,  call [phone].  After hours and on weekends, please call the GI answering service at [phone] who will take a message and have the physician on call contact you.   Following lower endoscopy (colonoscopy or flexible sigmoidoscopy):  Excessive amounts of blood in the stool  Significant tenderness or worsening of abdominal pains  Swelling of the abdomen that is new, acute  Fever of 100F or higher  FOLLOW UP: If any biopsies were taken you will be contacted by phone or by letter within the next 1-3 weeks.  Call your gastroenterologist if you have not heard about the biopsies in 3 weeks.  Our staff will call the home number listed on your records the next business day following your procedure to check on you and address any questions or concerns that you may have at that time regarding the information given to you following your procedure. This is a courtesy call and so if there is no answer at the home number and we have not heard from you through the emergency physician on call, we will assume that you have returned to your regular daily activities without incident.  SIGNATURES/CONFIDENTIALITY: You and/or your care partner have signed paperwork which will be entered into your electronic medical record.  These signatures attest to the fact that that the information above on your After Visit Summary has been reviewed and is understood.  Full responsibility of the confidentiality of this   discharge information lies with you and/or your care-partner.  Recommendations Continue current medications Next colonoscopy in 10 years

## 2013-06-10 ENCOUNTER — Telehealth: Payer: Self-pay | Admitting: *Deleted

## 2013-06-10 ENCOUNTER — Encounter: Payer: Self-pay | Admitting: Family Medicine

## 2013-06-10 NOTE — Telephone Encounter (Signed)
  Follow up Call-  Call back number 06/09/2013  Post procedure Call Back phone  # [phone]  Permission to leave phone message Yes     Patient questions:  Do you have a fever, pain , or abdominal swelling? no Pain Score  0 *  Have you tolerated food without any problems? yes  Have you been able to return to your normal activities? yes  Do you have any questions about your discharge instructions: Diet   no Medications  no Follow up visit  no  Do you have questions or concerns about your Care? no  Actions: * If pain score is 4 or above: No action needed, pain <4.

## 2013-06-24 ENCOUNTER — Telehealth: Payer: Self-pay | Admitting: Family Medicine

## 2013-06-24 ENCOUNTER — Encounter: Payer: Self-pay | Admitting: Family Medicine

## 2013-06-24 ENCOUNTER — Ambulatory Visit (INDEPENDENT_AMBULATORY_CARE_PROVIDER_SITE_OTHER): Payer: BC Managed Care – PPO | Admitting: Family Medicine

## 2013-06-24 VITALS — BP 110/70 | HR 77 | Temp 98.5°F | Ht 65.25 in | Wt 197.2 lb

## 2013-06-24 DIAGNOSIS — M542 Cervicalgia: Secondary | ICD-10-CM

## 2013-06-24 DIAGNOSIS — M6790 Unspecified disorder of synovium and tendon, unspecified site: Secondary | ICD-10-CM

## 2013-06-24 DIAGNOSIS — M67912 Unspecified disorder of synovium and tendon, left shoulder: Secondary | ICD-10-CM

## 2013-06-24 DIAGNOSIS — M719 Bursopathy, unspecified: Secondary | ICD-10-CM

## 2013-06-24 DIAGNOSIS — M7500 Adhesive capsulitis of unspecified shoulder: Secondary | ICD-10-CM

## 2013-06-24 DIAGNOSIS — M7502 Adhesive capsulitis of left shoulder: Secondary | ICD-10-CM

## 2013-06-24 NOTE — Telephone Encounter (Signed)
Pt bought in form for Dr Danise Mina to fill out for [facility], medical review form. Please call pt at cell [phone] or wk [phone] ext [phone] when ready to be picked up. Form placed on Kim's desk to give to Dr Danise Mina. Thank you!
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
## 2013-06-24 NOTE — Progress Notes (Signed)
Pre visit review using our clinic review tool, if applicable. No additional management support is needed unless otherwise documented below in the visit note. 

## 2013-06-24 NOTE — Telephone Encounter (Signed)
Form placed in your inbox

## 2013-06-25 NOTE — Telephone Encounter (Signed)
Filled and placed in Kim's box. 

## 2013-06-25 NOTE — Telephone Encounter (Addendum)
Message left notifying patient and paperwork placed up front for pick up. 

## 2013-07-27 ENCOUNTER — Ambulatory Visit: Payer: BC Managed Care – PPO | Admitting: Internal Medicine

## 2013-08-11 ENCOUNTER — Encounter: Payer: Self-pay | Admitting: General Surgery

## 2013-08-11 ENCOUNTER — Other Ambulatory Visit: Payer: Self-pay | Admitting: Physician Assistant

## 2013-08-19 ENCOUNTER — Ambulatory Visit (INDEPENDENT_AMBULATORY_CARE_PROVIDER_SITE_OTHER): Payer: BC Managed Care – PPO | Admitting: General Surgery

## 2013-08-19 ENCOUNTER — Encounter: Payer: Self-pay | Admitting: General Surgery

## 2013-08-19 VITALS — BP 122/74 | HR 76 | Resp 12 | Ht 65.0 in | Wt 199.0 lb

## 2013-08-19 DIAGNOSIS — Z1239 Encounter for other screening for malignant neoplasm of breast: Secondary | ICD-10-CM

## 2013-08-19 DIAGNOSIS — Z803 Family history of malignant neoplasm of breast: Secondary | ICD-10-CM

## 2013-08-19 DIAGNOSIS — N6019 Diffuse cystic mastopathy of unspecified breast: Secondary | ICD-10-CM

## 2013-08-19 NOTE — Patient Instructions (Addendum)
Patient to return in one year after bilateral screening mammogram.  Continue self breast exams. Call office for any new breast issues or concerns.

## 2013-08-19 NOTE — Progress Notes (Signed)
Patient ID: Cassie [name], female   DOB: January 21, 1970, 43 y.o.   MRN: [id number]  Chief Complaint  Patient presents with  . Follow-up    mammogram    HPI Cassie [name] is a 43 y.o. female. who presents for a breast evaluation. The most recent mammogram was done on   08/10/13.  No new complaints Patient does perform regular self breast checks and gets regular mammograms done.    HPI  Past Medical History  Diagnosis Date  . GERD (gastroesophageal reflux disease)   . Hx of migraines     occasional; improved since hysterectomy  . H/O syncope     cards work-up WNL (Hochrein) thought 2/2 overmedication vs arrhythmias  . Hx of endometriosis     hysterectomy  . Guttate psoriasis   . Rosacea     mild  . Asthma   . Diffuse cystic mastopathy   . Cardiac disease   . Allergy     Past Surgical History  Procedure Laterality Date  . Laparoscopy  1994    endometriosis  . Cholecystectomy  2008  . Breast mass excision  2008  . Tonsillectomy and adenoidectomy  1995  . Laparoscopic hysterectomy  2002    Dr. Kyra Manges [facility]  . Colonoscopy  2006    WNL Nicolasa Ducking)  . Cardiac catheterization  2007  . Abdominal hysterectomy    . Cardiac catheterization    . Tonsillectomy  1994  . Breast biopsy      multiple bilateral  . Breast biopsy  2010    left; core biopsy  . Breast surgery Right     excision of lesion  . Colonoscopy  06/2013    mod diverticulosis, rpt 10 yrs Henrene Pastor)    Family History  Problem Relation Age of Onset  . Breast cancer Mother   . Skin cancer Mother     melanoma  . Hypertension Mother   . Irritable bowel syndrome Mother   . Breast cancer Maternal Grandmother   . Stroke Maternal Grandmother   . Other Son     hyperinsulinemic  . Coronary artery disease Father     age 51's  . Coronary artery disease Paternal Uncle     age 22's  . Skin cancer Sister 23    melanoma - cancer in eye and uterus  . Thyroid disease Sister   . Breast cancer Paternal Aunt   .  Breast cancer      pat. great aunt  . Colon cancer Neg Hx   . Irritable bowel syndrome Daughter   . Diabetes Maternal Grandfather   . Diabetes Paternal Uncle   . Epilepsy Son     Social History History  Substance Use Topics  . Smoking status: Never Smoker   . Smokeless tobacco: Never Used  . Alcohol Use: Yes     Comment: Socially    No Known Allergies  Current Outpatient Prescriptions  Medication Sig Dispense Refill  . b complex vitamins tablet Take 1 tablet by mouth daily.      . Bisacodyl (DULCOLAX PO) Take by mouth as needed.      . ergocalciferol (VITAMIN D2) 50000 UNITS capsule Take 50,000 Units by mouth every 30 (thirty) days.      Marland Kitchen FIBER PO Take by mouth daily.      . fish oil-omega-3 fatty acids 1000 MG capsule Take 3 g by mouth daily.      Marland Kitchen ibuprofen (ADVIL,MOTRIN) 200 MG tablet Take 200 mg by mouth as needed.      Marland Kitchen  LINZESS 145 MCG CAPS capsule TAKE 1 CAPSULE (145 MCG TOTAL) BY MOUTH DAILY.  30 capsule  1  . Multiple Vitamin (MULTIVITAMIN) tablet Take 1 tablet by mouth daily.      . polyethylene glycol (MIRALAX / GLYCOLAX) packet Take 17 g by mouth daily.      . vitamin C (ASCORBIC ACID) 500 MG tablet Take 500 mg by mouth daily.       No current facility-administered medications for this visit.    Review of Systems Review of Systems  Constitutional: Negative.   Respiratory: Negative.   Cardiovascular: Negative.     Blood pressure 122/74, pulse 76, resp. rate 12, height 5\' 5"  (1.651 m), weight 199 lb (90.266 kg), last menstrual period 01/27/2000.  Physical Exam Physical Exam  Constitutional: She is oriented to person, place, and time. She appears well-developed and well-nourished.  Eyes: Conjunctivae are normal. No scleral icterus.  Neck: Neck supple. No mass and no thyromegaly present.  Cardiovascular: Normal rate, regular rhythm and normal heart sounds.   Pulmonary/Chest: Effort normal and breath sounds normal. Right breast exhibits no inverted nipple,  no mass, no nipple discharge, no skin change and no tenderness. Left breast exhibits no inverted nipple, no mass, no nipple discharge, no skin change and no tenderness.  Abdominal: Soft. Bowel sounds are normal. She exhibits no distension. There is no tenderness.  Lymphadenopathy:    She has no cervical adenopathy.    She has no axillary adenopathy.  Neurological: She is alert and oriented to person, place, and time.  Skin: Skin is warm and dry.    Data Reviewed Mammogram reviewed and stable.  Assessment    Physical exam stable,  No new findings. History of fibrocystic disease, strong family history of breast cancer.     Plan    Patient to return in one year after bilateral screening mammogram. Discussed genetic testing patient will think about this and decide.        Jermika Olden G 08/21/2013, 10:39 AM

## 2013-08-21 ENCOUNTER — Encounter: Payer: Self-pay | Admitting: General Surgery

## 2013-08-25 ENCOUNTER — Ambulatory Visit (INDEPENDENT_AMBULATORY_CARE_PROVIDER_SITE_OTHER): Payer: BC Managed Care – PPO | Admitting: Internal Medicine

## 2013-08-25 ENCOUNTER — Encounter: Payer: Self-pay | Admitting: Internal Medicine

## 2013-08-25 VITALS — BP 118/68 | HR 80 | Temp 98.5°F | Wt 198.0 lb

## 2013-08-25 DIAGNOSIS — M25512 Pain in left shoulder: Secondary | ICD-10-CM

## 2013-08-25 DIAGNOSIS — M792 Neuralgia and neuritis, unspecified: Secondary | ICD-10-CM

## 2013-08-25 DIAGNOSIS — M25519 Pain in unspecified shoulder: Secondary | ICD-10-CM

## 2013-08-25 DIAGNOSIS — M719 Bursopathy, unspecified: Secondary | ICD-10-CM

## 2013-08-25 DIAGNOSIS — M67919 Unspecified disorder of synovium and tendon, unspecified shoulder: Secondary | ICD-10-CM

## 2013-08-25 DIAGNOSIS — IMO0002 Reserved for concepts with insufficient information to code with codable children: Secondary | ICD-10-CM

## 2013-08-25 DIAGNOSIS — M7552 Bursitis of left shoulder: Secondary | ICD-10-CM

## 2013-08-25 MED ORDER — PREDNISONE 10 MG PO TABS: ORAL_TABLET | ORAL | Status: DC

## 2013-08-25 MED ORDER — CYCLOBENZAPRINE HCL 5 MG PO TABS: 5.0000 mg | ORAL_TABLET | Freq: Three times a day (TID) | ORAL | Status: DC | PRN

## 2013-08-25 NOTE — Progress Notes (Signed)
Pre visit review using our clinic review tool, if applicable. No additional management support is needed unless otherwise documented below in the visit note. 
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
## 2013-08-25 NOTE — Patient Instructions (Signed)

## 2013-09-09 ENCOUNTER — Ambulatory Visit (INDEPENDENT_AMBULATORY_CARE_PROVIDER_SITE_OTHER): Payer: BC Managed Care – PPO | Admitting: Family Medicine

## 2013-09-09 ENCOUNTER — Encounter: Payer: Self-pay | Admitting: Family Medicine

## 2013-09-09 VITALS — BP 108/70 | HR 81 | Temp 98.4°F | Ht 65.0 in | Wt 199.0 lb

## 2013-09-09 DIAGNOSIS — M7542 Impingement syndrome of left shoulder: Secondary | ICD-10-CM

## 2013-09-09 DIAGNOSIS — M7580 Other shoulder lesions, unspecified shoulder: Secondary | ICD-10-CM

## 2013-09-09 DIAGNOSIS — M25819 Other specified joint disorders, unspecified shoulder: Secondary | ICD-10-CM

## 2013-09-09 DIAGNOSIS — M5010 Cervical disc disorder with radiculopathy, unspecified cervical region: Secondary | ICD-10-CM

## 2013-09-09 DIAGNOSIS — M5412 Radiculopathy, cervical region: Secondary | ICD-10-CM

## 2013-09-09 MED ORDER — PREDNISONE 20 MG PO TABS: ORAL_TABLET | ORAL | Status: DC

## 2013-09-09 MED ORDER — HYDROCODONE-ACETAMINOPHEN 5-325 MG PO TABS: 1.0000 | ORAL_TABLET | Freq: Four times a day (QID) | ORAL | Status: DC | PRN

## 2013-09-09 NOTE — Patient Instructions (Signed)
Look up on Youtube  McKenzie Protocol Cervical Spine

## 2013-09-09 NOTE — Progress Notes (Signed)
Dr. Frederico Hamman T. Blossom Crume, MD, [facility] [place], [address] Phone: [phone] Fax: [phone]  09/09/2013  Patient: Cassie [name], MRN: [id number], DOB: [birth year], 43 y.o.  Primary Physician:  Ria Bush, MD  Chief Complaint: Shoulder Pain  Subjective:   Cassie [name] is a 43 y.o. very pleasant female patient who presents with the following:  2 weeks ago, had some stress and pain in the posterior of her neck and in the cervical spine. Saw Ms. Baity, pred and muscle relaxer. 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 She has done well from that and her motion is back to normal. Radiculopathy and neck discomfort are the primary problems.   30-20-10 pred x 6 days taper by Ms. Baity.   Past Medical History, Surgical History, Social History, Family History, Problem List, Medications, and Allergies have been reviewed and updated if relevant.  GEN: No fevers, chills. Nontoxic. Primarily MSK c/o today. MSK: Detailed in the HPI GI: tolerating PO intake without difficulty Neuro: as above Otherwise the pertinent positives of the ROS are noted above.   Objective:   BP 108/70  Pulse 81  Temp(Src) 98.4 F (36.9 C) (Oral)  Ht 5\' 5"  (1.651 m)  Wt 199 lb (90.266 kg)  BMI 33.12 kg/m2  LMP 01/27/2000   GEN: Well-developed,well-nourished,in no acute distress; alert,appropriate and cooperative throughout examination HEENT: Normocephalic and atraumatic without obvious abnormalities. Ears, externally no deformities PULM: Breathing comfortably in no respiratory distress EXT: No clubbing, cyanosis, or edema PSYCH: Normally interactive. Cooperative during the interview. Pleasant. Friendly and conversant. Not anxious or depressed appearing. Normal, full affect.  Shoulder: L Inspection: No muscle wasting or winging Ecchymosis/edema: neg  AC joint, scapula, clavicle: NT Cervical spine: 15% loss of motion and TTP  posterior paracervical Spurling's: neg Abduction: full, 5/5 Flexion: full, 5/5 IR, full, lift-off: 5/5 ER at neutral: full, 5/5 AC crossover: mild pos Neer: neg Hawkins: pos Drop Test: neg Empty Can: neg Supraspinatus insertion: mild-mod T Bicipital groove: NT Speed's: neg Yergason's: neg Sulcus sign: neg Scapular dyskinesis: none C5-T1 intact  Neuro: Sensation intact Grip 5/5   Radiology: No results found.  Assessment and Plan:   Cervical disc disorder with radiculopathy of cervical region  Impingement syndrome of left shoulder  Primary neck with radicular symptoms.  Shoulder impingement is mild at this point, likely secondary  12 day pred burst and taper  Patient Instructions  Look up on Youtube  McKenzie Protocol Cervical Spine      Follow-up: Return in about 1 month (around 10/09/2013).  New Prescriptions   HYDROCODONE-ACETAMINOPHEN (NORCO/VICODIN) 5-325 MG PER TABLET    Take 1 tablet by mouth every 6 (six) hours as needed for moderate pain.   PREDNISONE (DELTASONE) 20 MG TABLET    2 tabs po for 6 days, then 1 tab for 6 days   No orders of the defined types were placed in this encounter.    Signed,  Maud Deed. Imraan Wendell, MD   Patient's Medications  New Prescriptions   HYDROCODONE-ACETAMINOPHEN (NORCO/VICODIN) 5-325 MG PER TABLET    Take 1 tablet by mouth every 6 (six) hours as needed for moderate pain.   PREDNISONE (DELTASONE) 20 MG TABLET    2 tabs po for 6 days, then 1 tab for 6 days  Previous Medications   B COMPLEX VITAMINS TABLET    Take 1 tablet by mouth daily.   BISACODYL (DULCOLAX PO)    Take by  mouth as needed.   CYCLOBENZAPRINE (FLEXERIL) 5 MG TABLET    Take 1 tablet (5 mg total) by mouth 3 (three) times daily as needed for muscle spasms.   ERGOCALCIFEROL (VITAMIN D2) 50000 UNITS CAPSULE    Take 50,000 Units by mouth every 30 (thirty) days.   FIBER PO    Take by mouth daily.   FISH OIL-OMEGA-3 FATTY ACIDS 1000 MG CAPSULE    Take 3 g by  mouth daily.   IBUPROFEN (ADVIL,MOTRIN) 200 MG TABLET    Take 200 mg by mouth as needed.   LINZESS 145 MCG CAPS CAPSULE    TAKE 1 CAPSULE (145 MCG TOTAL) BY MOUTH DAILY.   MULTIPLE VITAMIN (MULTIVITAMIN) TABLET    Take 1 tablet by mouth daily.   POLYETHYLENE GLYCOL (MIRALAX / GLYCOLAX) PACKET    Take 17 g by mouth daily.   VITAMIN C (ASCORBIC ACID) 500 MG TABLET    Take 500 mg by mouth daily.  Modified Medications   No medications on file  Discontinued Medications   PREDNISONE (DELTASONE) 10 MG TABLET    Take 3 tabs on days 1-2, take 2 tabs on days 3-4, take 1 tab on days 5-6

## 2013-09-09 NOTE — Progress Notes (Signed)
Pre visit review using our clinic review tool, if applicable. No additional management support is needed unless otherwise documented below in the visit note. 

## 2013-10-07 ENCOUNTER — Encounter: Payer: Self-pay | Admitting: Family Medicine

## 2013-10-07 ENCOUNTER — Ambulatory Visit (INDEPENDENT_AMBULATORY_CARE_PROVIDER_SITE_OTHER): Payer: BC Managed Care – PPO | Admitting: Family Medicine

## 2013-10-07 VITALS — BP 108/70 | HR 72 | Temp 98.3°F | Ht 65.0 in | Wt 199.8 lb

## 2013-10-07 DIAGNOSIS — M5010 Cervical disc disorder with radiculopathy, unspecified cervical region: Secondary | ICD-10-CM

## 2013-10-07 DIAGNOSIS — M7542 Impingement syndrome of left shoulder: Secondary | ICD-10-CM

## 2013-10-07 NOTE — Patient Instructions (Signed)
Dr. Grant Fontana, [facility]

## 2013-10-07 NOTE — Progress Notes (Signed)
Pre visit review using our clinic review tool, if applicable. No additional management support is needed unless otherwise documented below in the visit note. 

## 2013-10-07 NOTE — Progress Notes (Signed)
Dr. Frederico Hamman T. Emilie Carp, MD, [facility] [place], [address] Phone: [phone] Fax: [phone]  10/07/2013  Patient: Cassie [name], MRN: [id number], DOB: May 23, 1970, 43 y.o.  Primary Physician:  Ria Bush, MD  Chief Complaint: Follow-up  Subjective:   Cassie [name] is a 43 y.o. very pleasant female patient who presents with the following:  No tingling in her fingers, afraid might drop her shoulders and to down in her upper / middle c-spine.  Mostly now in the trap. Mild impingement with abduction, but overall she is doing much better. Frustrated with ongoing mechanical symptoms.   09/09/2013 Last OV with Owens Loffler, MD  2 weeks ago, had some stress and pain in the posterior of her neck and in the cervical spine. Saw Ms. Baity, pred and muscle relaxer. 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 She has done well from that and her motion is back to normal. Radiculopathy and neck discomfort are the primary problems.   30-20-10 pred x 6 days taper by Ms. Baity.   Past Medical History, Surgical History, Social History, Family History, Problem List, Medications, and Allergies have been reviewed and updated if relevant.  GEN: No fevers, chills. Nontoxic. Primarily MSK c/o today. MSK: Detailed in the HPI GI: tolerating PO intake without difficulty Neuro: as above Otherwise the pertinent positives of the ROS are noted above.   Objective:   BP 108/70  Pulse 72  Temp(Src) 98.3 F (36.8 C) (Oral)  Ht 5\' 5"  (1.651 m)  Wt 199 lb 12 oz (90.606 kg)  BMI 33.24 kg/m2  SpO2 97%  LMP 01/27/2000   GEN: Well-developed,well-nourished,in no acute distress; alert,appropriate and cooperative throughout examination HEENT: Normocephalic and atraumatic without obvious abnormalities. Ears, externally no deformities PULM: Breathing comfortably in no respiratory distress EXT: No clubbing, cyanosis, or  edema PSYCH: Normally interactive. Cooperative during the interview. Pleasant. Friendly and conversant. Not anxious or depressed appearing. Normal, full affect.  Shoulder: L Inspection: No muscle wasting or winging Ecchymosis/edema: neg  AC joint, scapula, clavicle: NT Cervical spine: 15% loss of motion and TTP posterior paracervical - more on trap on the LEFT Spurling's: neg Abduction: full, 5/5 Flexion: full, 5/5 IR, full, lift-off: 5/5 ER at neutral: full, 5/5 AC crossover: mild pos Neer: neg Hawkins: pos, mild Drop Test: neg Empty Can: neg Supraspinatus insertion: mild-mod T Bicipital groove: NT Speed's: neg Yergason's: neg Sulcus sign: neg Scapular dyskinesis: none C5-T1 intact  Neuro: Sensation intact Grip 5/5   Radiology: No results found.  Assessment and Plan:   Cervical disc disorder with radiculopathy of cervical region  Impingement syndrome of left shoulder   Radiculopathy improved / gone. Still with mostly neck and shoulder blade pain.  I think going to the chiropractor would be very helpful at this point. I gave her Dr. Lala Lund name. Accupuncture would also be reasonable.  Shoulder impingement is mild at this point, likely secondary   Signed,  Orazio Weller T. Nathalia Wismer, MD   Patient's Medications  New Prescriptions   No medications on file  Previous Medications   B COMPLEX VITAMINS TABLET    Take 1 tablet by mouth daily.   BISACODYL (DULCOLAX PO)    Take by mouth as needed.   ERGOCALCIFEROL (VITAMIN D2) 50000 UNITS CAPSULE    Take 50,000 Units by mouth every 30 (thirty) days.   FIBER PO    Take by mouth daily.   FISH OIL-OMEGA-3 FATTY ACIDS 1000 MG  CAPSULE    Take 3 g by mouth daily.   IBUPROFEN (ADVIL,MOTRIN) 200 MG TABLET    Take 200 mg by mouth as needed.   LINZESS 145 MCG CAPS CAPSULE    TAKE 1 CAPSULE (145 MCG TOTAL) BY MOUTH DAILY.   MULTIPLE VITAMIN (MULTIVITAMIN) TABLET    Take 1 tablet by mouth daily.   POLYETHYLENE GLYCOL (MIRALAX /  GLYCOLAX) PACKET    Take 17 g by mouth daily.   VITAMIN C (ASCORBIC ACID) 500 MG TABLET    Take 500 mg by mouth daily.  Modified Medications   No medications on file  Discontinued Medications   CYCLOBENZAPRINE (FLEXERIL) 5 MG TABLET    Take 1 tablet (5 mg total) by mouth 3 (three) times daily as needed for muscle spasms.   HYDROCODONE-ACETAMINOPHEN (NORCO/VICODIN) 5-325 MG PER TABLET    Take 1 tablet by mouth every 6 (six) hours as needed for moderate pain.   PREDNISONE (DELTASONE) 20 MG TABLET    2 tabs po for 6 days, then 1 tab for 6 days

## 2013-10-15 ENCOUNTER — Other Ambulatory Visit: Payer: Self-pay | Admitting: Physician Assistant

## 2013-11-02 ENCOUNTER — Encounter: Payer: Self-pay | Admitting: Family Medicine

## 2013-12-09 ENCOUNTER — Ambulatory Visit (INDEPENDENT_AMBULATORY_CARE_PROVIDER_SITE_OTHER): Payer: BC Managed Care – PPO | Admitting: Family Medicine

## 2013-12-09 ENCOUNTER — Encounter: Payer: Self-pay | Admitting: Family Medicine

## 2013-12-09 VITALS — BP 106/70 | HR 75 | Temp 98.6°F | Ht 65.0 in | Wt 189.0 lb

## 2013-12-09 DIAGNOSIS — M7542 Impingement syndrome of left shoulder: Secondary | ICD-10-CM

## 2013-12-09 DIAGNOSIS — M7582 Other shoulder lesions, left shoulder: Secondary | ICD-10-CM

## 2013-12-09 NOTE — Progress Notes (Signed)
Pre visit review using our clinic review tool, if applicable. No additional management support is needed unless otherwise documented below in the visit note. 
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
## 2013-12-09 NOTE — Patient Instructions (Signed)

## 2013-12-14 ENCOUNTER — Encounter: Payer: Self-pay | Admitting: Internal Medicine

## 2013-12-14 ENCOUNTER — Ambulatory Visit (INDEPENDENT_AMBULATORY_CARE_PROVIDER_SITE_OTHER): Payer: BC Managed Care – PPO | Admitting: Internal Medicine

## 2013-12-14 VITALS — BP 108/64 | HR 94 | Temp 98.3°F | Wt 192.0 lb

## 2013-12-14 DIAGNOSIS — H109 Unspecified conjunctivitis: Secondary | ICD-10-CM

## 2013-12-14 DIAGNOSIS — J309 Allergic rhinitis, unspecified: Secondary | ICD-10-CM

## 2013-12-14 MED ORDER — POLYMYXIN B-TRIMETHOPRIM 10000-0.1 UNIT/ML-% OP SOLN: 1.0000 [drp] | OPHTHALMIC | Status: DC

## 2013-12-14 NOTE — Progress Notes (Signed)
HPI  Pt presents to the clinic today with c/o cough and right eye irritation. She reports she woke up this am with her eye crusted shut. She does have some associated burning in that eye. The cough is unproductive. She denies fever, chills or other URI symptoms. She has taken Allegra D OTC. She does have a history of seasonal allergies and asthma. She has had sick contacts.  Review of Systems      Past Medical History  Diagnosis Date  . GERD (gastroesophageal reflux disease)   . Hx of migraines     occasional; improved since hysterectomy  . H/O syncope     cards work-up WNL (Hochrein) thought 2/2 overmedication vs arrhythmias  . Hx of endometriosis     hysterectomy  . Guttate psoriasis   . Rosacea     mild  . Asthma   . Diffuse cystic mastopathy   . Cardiac disease   . Allergy     Family History  Problem Relation Age of Onset  . Breast cancer Mother   . Skin cancer Mother     melanoma  . Hypertension Mother   . Irritable bowel syndrome Mother   . Breast cancer Maternal Grandmother   . Stroke Maternal Grandmother   . Other Son     hyperinsulinemic  . Coronary artery disease Father     age 39's  . Coronary artery disease Paternal Uncle     age 31's  . Skin cancer Sister 61    melanoma - cancer in eye and uterus  . Thyroid disease Sister   . Breast cancer Paternal Aunt   . Breast cancer      pat. great aunt  . Colon cancer Neg Hx   . Irritable bowel syndrome Daughter   . Diabetes Maternal Grandfather   . Diabetes Paternal Uncle   . Epilepsy Son     History   Social History  . Marital Status: Married    Spouse Name: N/A    Number of Children: 2  . Years of Education: N/A   Occupational History  . Glass blower/designer    Social History Main Topics  . Smoking status: Never Smoker   . Smokeless tobacco: Never Used  . Alcohol Use: 0.0 oz/week    0 Not specified per week     Comment: Socially  . Drug Use: No  . Sexual Activity: Not on file   Other Topics  Concern  . Not on file   Social History Narrative   Glass blower/designer   Lives with husband; 2 children; 1 dog          Allergies  Allergen Reactions  . Shellfish Allergy      Constitutional: Denies headache, fatigue, fever or  abrupt weight changes.  HEENT:  Positive right eye redness and pain. Denies pressure behind the eyes, facial pain, nasal congestion, ear pain, ringing in the ears, wax buildup, runny nose or sore throat. Respiratory: Positive cough. Denies difficulty breathing or shortness of breath.  Cardiovascular: Denies chest pain, chest tightness, palpitations or swelling in the hands or feet.   No other specific complaints in a complete review of systems (except as listed in HPI above).  Objective:   BP 108/64 mmHg  Pulse 94  Temp(Src) 98.3 F (36.8 C) (Oral)  Wt 192 lb (87.091 kg)  SpO2 98%  LMP 01/27/2000  Wt Readings from Last 3 Encounters:  12/14/13 192 lb (87.091 kg)  12/09/13 189 lb (85.73 kg)  10/07/13 199 lb  12 oz (90.606 kg)     General: Appears her stated age, in NAD. HEENT: Head: normal shape and size no sinus tenderness noted; Right Eye: sclera injected, no icterus, conjunctiva erythematous with green mucous noted, periorbital edema noted: Left Eye: sclera white, no icterus, conjunctiva pink. Ears: Tm's pink but intact, normal light reflex + serous effusion bilaterally; Nose: mucosa pink and moist, septum midline; Throat/Mouth: + PND. Teeth present, mucosa erythematous and moist, no exudate noted, no lesions or ulcerations noted.  Neck: No cervical adenopathy noted. Cardiovascular: Normal rate and rhythm. S1,S2 noted.  No murmur, rubs or gallops noted. Pulmonary/Chest: Normal effort and positive vesicular breath sounds. No respiratory distress. No wheezes, rales or ronchi noted.      Assessment & Plan:   Bacterial Conjunctivitis, right:  eRx for Polytrim Q4H while awake x 5 days Ibuprofen for inflammation Warm compresses TID Wash hands  thoroughly to prevent spreading infection  Allergic Rhinitis:  Continue Allegra D Add flonase  RTC as needed or if symptoms persist.

## 2013-12-14 NOTE — Progress Notes (Signed)
Pre visit review using our clinic review tool, if applicable. No additional management support is needed unless otherwise documented below in the visit note. 

## 2013-12-14 NOTE — Patient Instructions (Signed)

## 2013-12-18 ENCOUNTER — Telehealth: Payer: Self-pay | Admitting: Family Medicine

## 2013-12-18 MED ORDER — AZITHROMYCIN 250 MG PO TABS: ORAL_TABLET | ORAL | Status: DC

## 2013-12-18 NOTE — Telephone Encounter (Signed)
Seen today with son. Seen Monday with conjunctivitis, cough, treated with polytrim eye drops, allegra D and flonase.  Persistent worsening cough more productive, trouble sleeping at night time.  Mainly sinus congestion. No fevers.  Lungs CTAB Will treat with zpack and delsym  Update if not improving as expected.

## 2014-02-01 DIAGNOSIS — S43439A Superior glenoid labrum lesion of unspecified shoulder, initial encounter: Secondary | ICD-10-CM

## 2014-02-01 HISTORY — DX: Superior glenoid labrum lesion of unspecified shoulder, initial encounter: S43.439A

## 2014-02-18 ENCOUNTER — Encounter: Payer: Self-pay | Admitting: Family Medicine

## 2014-06-02 ENCOUNTER — Ambulatory Visit (INDEPENDENT_AMBULATORY_CARE_PROVIDER_SITE_OTHER): Payer: BLUE CROSS/BLUE SHIELD | Admitting: Primary Care

## 2014-06-02 ENCOUNTER — Encounter: Payer: Self-pay | Admitting: Primary Care

## 2014-06-02 VITALS — BP 106/70 | HR 85 | Temp 98.6°F | Ht 65.0 in | Wt 189.4 lb

## 2014-06-02 DIAGNOSIS — L237 Allergic contact dermatitis due to plants, except food: Secondary | ICD-10-CM

## 2014-06-02 MED ORDER — PREDNISONE 20 MG PO TABS: ORAL_TABLET | ORAL | Status: DC

## 2014-06-02 NOTE — Progress Notes (Signed)
Subjective:    Patient ID: Cassie [name], female    DOB: 09-21-1970, 44 y.o.   MRN: [id number]  HPI  Cassie [name] is a 44 year old female who presents today with a chief complaint of rash that has been present since waking this morning. Her rash is present to bilateral arms, posterior chest, and feet. She's been working in the yard for the past 3 weeks to prepare for a party and came into contact with some poison oak in her back yard. She tried applying hydrocortisone cream last night without much relief.   Review of Systems  Constitutional: Negative for fever and chills.  Respiratory: Negative for shortness of breath.   Cardiovascular: Negative for chest pain.  Skin: Positive for rash.  Neurological: Negative for dizziness.       Past Medical History  Diagnosis Date  . GERD (gastroesophageal reflux disease)   . Hx of migraines     occasional; improved since hysterectomy  . H/O syncope     cards work-up WNL (Cassie [name]) thought 2/2 overmedication vs arrhythmias  . Hx of endometriosis     hysterectomy  . Guttate psoriasis   . Rosacea     mild  . Asthma   . Diffuse cystic mastopathy   . Cardiac disease   . Allergy   . Labral tear of shoulder 02/2014    R, pending surgery Cassie Punt)    History   Social History  . Marital Status: Married    Spouse Name: N/A  . Number of Children: 2  . Years of Education: N/A   Occupational History  . Glass blower/designer    Social History Main Topics  . Smoking status: Never Smoker   . Smokeless tobacco: Never Used  . Alcohol Use: 0.0 oz/week    0 Standard drinks or equivalent per week     Comment: Socially  . Drug Use: No  . Sexual Activity: Not on file   Other Topics Concern  . Not on file   Social History Narrative   Glass blower/designer   Lives with husband; 2 children; 1 dog          Past Surgical History  Procedure Laterality Date  . Laparoscopy  1994    endometriosis  . Cholecystectomy  2008  . Breast mass excision   2008  . Tonsillectomy and adenoidectomy  1995  . Laparoscopic hysterectomy  2002    Cassie [name]  . Colonoscopy  2006    WNL Cassie Ducking)  . Cardiac catheterization  2007  . Abdominal hysterectomy    . Cardiac catheterization    . Tonsillectomy  1994  . Breast biopsy      multiple bilateral  . Breast biopsy  2010    left; core biopsy  . Breast surgery Right     excision of lesion  . Colonoscopy  06/2013    mod diverticulosis, rpt 10 yrs Cassie Pastor)    Family History  Problem Relation Age of Onset  . Breast cancer Mother   . Skin cancer Mother     melanoma  . Hypertension Mother   . Irritable bowel syndrome Mother   . Breast cancer Maternal Grandmother   . Stroke Maternal Grandmother   . Other Son     hyperinsulinemic  . Coronary artery disease Father     age 73's  . Coronary artery disease Paternal Uncle     age 40's  . Skin cancer Sister 26    melanoma - cancer in eye  and uterus  . Thyroid disease Sister   . Breast cancer Paternal Aunt   . Breast cancer      pat. great aunt  . Colon cancer Neg Hx   . Irritable bowel syndrome Daughter   . Diabetes Maternal Grandfather   . Diabetes Paternal Uncle   . Epilepsy Son     Allergies  Allergen Reactions  . Shellfish Allergy     Current Outpatient Prescriptions on File Prior to Visit  Medication Sig Dispense Refill  . b complex vitamins tablet Take 1 tablet by mouth daily.    . ergocalciferol (VITAMIN D2) 50000 UNITS capsule Take 50,000 Units by mouth every 30 (thirty) days.    Marland Kitchen FIBER PO Take by mouth daily.    . fish oil-omega-3 fatty acids 1000 MG capsule Take 3 g by mouth daily.    Marland Kitchen ibuprofen (ADVIL,MOTRIN) 200 MG tablet Take 200 mg by mouth as needed.    . Multiple Vitamin (MULTIVITAMIN) tablet Take 1 tablet by mouth daily.    . polyethylene glycol (MIRALAX / GLYCOLAX) packet Take 17 g by mouth daily.    . vitamin C (ASCORBIC ACID) 500 MG tablet Take 500 mg by mouth daily.     No current  facility-administered medications on file prior to visit.    BP 106/70 mmHg  Pulse 85  Temp(Src) 98.6 F (37 C) (Oral)  Ht 5\' 5"  (1.651 m)  Wt 189 lb 6.4 oz (85.911 kg)  BMI 31.52 kg/m2  SpO2 97%  LMP 01/27/2000    Objective:   Physical Exam  Constitutional: She appears well-nourished. She does not appear ill.  Cardiovascular: Normal rate and regular rhythm.   Pulmonary/Chest: Effort normal and breath sounds normal.  Skin: Skin is warm and dry. Rash noted.  Mild rash present to bilateral arms, dorsal aspect of feet, popliteal fossa, bends of elbows, and top of posterior trunk          Assessment & Plan:  Contact dermatitis:  Caused by contact with [place] which lives in her backyard. Present since this morning with widespread rash in a few hours. Treat with Prednisone taper over 14 days. Calomine lotion and cool compresses to affected areas. Call if no improvement in the next 3-4 days.

## 2014-06-02 NOTE — Progress Notes (Signed)
Pre visit review using our clinic review tool, if applicable. No additional management support is needed unless otherwise documented below in the visit note. 

## 2014-06-02 NOTE — Patient Instructions (Signed)
Start Prednisone tablets. Take 3 tablets by mouth for four days, then take 2 tablets by mouth for four days, then take 1 tablet by mouth for four days. Call me if no improvement by Friday. Apply Calomine lotion to spots for itching. Also try cool compresses to affected areas to help with itching. It was nice meeting you!  Poison [employer] ivy is a inflammation of the skin (contact dermatitis) caused by touching the allergens on the leaves of the ivy plant following previous exposure to the plant. The rash usually appears 48 hours after exposure. The rash is usually bumps (papules) or blisters (vesicles) in a linear pattern. Depending on your own sensitivity, the rash may simply cause redness and itching, or it may also progress to blisters which may break open. These must be well cared for to prevent secondary bacterial (germ) infection, followed by scarring. Keep any open areas dry, clean, dressed, and covered with an antibacterial ointment if needed. The eyes may also get puffy. The puffiness is worst in the morning and gets better as the day progresses. This dermatitis usually heals without scarring, within 2 to 3 weeks without treatment. HOME CARE INSTRUCTIONS  Thoroughly wash with soap and water as soon as you have been exposed to poison ivy. You have about one half hour to remove the plant resin before it will cause the rash. This washing will destroy the oil or antigen on the skin that is causing, or will cause, the rash. Be sure to wash under your fingernails as any plant resin there will continue to spread the rash. Do not rub skin vigorously when washing affected area. Poison ivy cannot spread if no oil from the plant remains on your body. A rash that has progressed to weeping sores will not spread the rash unless you have not washed thoroughly. It is also important to wash any clothes you have been wearing as these may carry active allergens. The rash will return if you wear the unwashed  clothing, even several days later. Avoidance of the plant in the future is the best measure. Poison ivy plant can be recognized by the number of leaves. Generally, poison ivy has three leaves with flowering branches on a single stem. Diphenhydramine may be purchased over the counter and used as needed for itching. Do not drive with this medication if it makes you drowsy.Ask your caregiver about medication for children. SEEK MEDICAL CARE IF:  Open sores develop.  Redness spreads beyond area of rash.  You notice purulent (pus-like) discharge.  You have increased pain.  Other signs of infection develop (such as fever). Document Released: 12/16/1999 Document Revised: 03/12/2011 Document Reviewed: 05/28/2008 [facility] Patient Information 2015 [place], [place]. This information is not intended to replace advice given to you by your health care provider. Make sure you discuss any questions you have with your health care provider.

## 2014-06-30 ENCOUNTER — Other Ambulatory Visit: Payer: Self-pay

## 2014-06-30 DIAGNOSIS — Z1231 Encounter for screening mammogram for malignant neoplasm of breast: Secondary | ICD-10-CM

## 2014-08-18 ENCOUNTER — Other Ambulatory Visit: Payer: Self-pay | Admitting: General Surgery

## 2014-08-18 ENCOUNTER — Ambulatory Visit
Admission: RE | Admit: 2014-08-18 | Discharge: 2014-08-18 | Disposition: A | Discharge status: Home / Self Care | Payer: BLUE CROSS/BLUE SHIELD | Source: Ambulatory Visit | Attending: General Surgery | Admitting: General Surgery

## 2014-08-18 DIAGNOSIS — Z1231 Encounter for screening mammogram for malignant neoplasm of breast: Secondary | ICD-10-CM

## 2014-08-18 DIAGNOSIS — R922 Inconclusive mammogram: Secondary | ICD-10-CM | POA: Diagnosis not present

## 2014-08-25 ENCOUNTER — Telehealth: Payer: Self-pay | Admitting: *Deleted

## 2014-08-25 ENCOUNTER — Other Ambulatory Visit: Payer: Self-pay | Admitting: *Deleted

## 2014-08-25 DIAGNOSIS — R928 Other abnormal and inconclusive findings on diagnostic imaging of breast: Secondary | ICD-10-CM

## 2014-08-25 NOTE — Telephone Encounter (Signed)
Patient had her mammo at [facility] on 08/18/14 and she just spoke with Estill Bamberg saying that she needs additional views and for the patient to call us so we can send in an order to them.

## 2014-08-25 NOTE — Telephone Encounter (Signed)
Spoke with Dr Jamal Collin and Estill Bamberg and order has been sent.

## 2014-08-26 ENCOUNTER — Ambulatory Visit: Payer: Self-pay | Admitting: General Surgery

## 2014-08-31 ENCOUNTER — Ambulatory Visit
Admission: RE | Admit: 2014-08-31 | Discharge: 2014-08-31 | Disposition: A | Discharge status: Home / Self Care | Payer: BLUE CROSS/BLUE SHIELD | Source: Ambulatory Visit | Attending: General Surgery | Admitting: General Surgery

## 2014-08-31 ENCOUNTER — Other Ambulatory Visit: Payer: Self-pay | Admitting: General Surgery

## 2014-08-31 ENCOUNTER — Encounter: Payer: Self-pay | Admitting: General Surgery

## 2014-08-31 ENCOUNTER — Ambulatory Visit (INDEPENDENT_AMBULATORY_CARE_PROVIDER_SITE_OTHER): Payer: BLUE CROSS/BLUE SHIELD | Admitting: General Surgery

## 2014-08-31 VITALS — BP 110/68 | HR 76 | Resp 14 | Ht 65.0 in | Wt 197.0 lb

## 2014-08-31 DIAGNOSIS — Z803 Family history of malignant neoplasm of breast: Secondary | ICD-10-CM

## 2014-08-31 DIAGNOSIS — R928 Other abnormal and inconclusive findings on diagnostic imaging of breast: Secondary | ICD-10-CM

## 2014-08-31 DIAGNOSIS — N6019 Diffuse cystic mastopathy of unspecified breast: Secondary | ICD-10-CM | POA: Diagnosis not present

## 2014-08-31 NOTE — Patient Instructions (Signed)
The patient is aware to call back for any questions or concerns. Continue self breast exams. Call office for any new breast issues or concerns. 

## 2014-08-31 NOTE — Progress Notes (Signed)
Patient ID: Cassie [name], female   DOB: [birth year], 44 y.o.   MRN: [id number]  Chief Complaint  Patient presents with  . Follow-up    Bilateral mammogram    HPI Cassie [name] is a 44 y.o. female  who presents for mammogram follow up. The most recent mammogram was done on 08/16/2014. No new complaints. Patient does perform regular self breast checks and gets regular mammograms done.She states she can feel a lump in the left breast since the mammogram.   HPI  Past Medical History  Diagnosis Date  . GERD (gastroesophageal reflux disease)   . Hx of migraines     occasional; improved since hysterectomy  . H/O syncope     cards work-up WNL (Hochrein) thought 2/2 overmedication vs arrhythmias  . Hx of endometriosis     hysterectomy  . Guttate psoriasis   . Rosacea     mild  . Asthma   . Diffuse cystic mastopathy   . Cardiac disease   . Allergy   . Labral tear of shoulder 02/2014    R, pending surgery Tamera Punt)    Past Surgical History  Procedure Laterality Date  . Laparoscopy  1994    endometriosis  . Cholecystectomy  2008  . Breast mass excision  2008  . Tonsillectomy and adenoidectomy  1995  . Laparoscopic hysterectomy  2002    Dr. Kyra Manges [facility]  . Colonoscopy  2006    WNL Nicolasa Ducking)  . Cardiac catheterization  2007  . Abdominal hysterectomy    . Cardiac catheterization    . Tonsillectomy  1994  . Breast surgery Right     excision of lesion  . Colonoscopy  06/2013    mod diverticulosis, rpt 10 yrs Henrene Pastor)  . Breast biopsy      multiple bilateral  . Breast biopsy  2010    left; core biopsy    Family History  Problem Relation Age of Onset  . Breast cancer Mother   . Skin cancer Mother     melanoma  . Hypertension Mother   . Irritable bowel syndrome Mother   . Breast cancer Maternal Grandmother   . Stroke Maternal Grandmother   . Other Son     hyperinsulinemic  . Coronary artery disease Father     age 67's  . Coronary artery disease Paternal  Uncle     age 73's  . Skin cancer Sister 30    melanoma - cancer in eye and uterus  . Thyroid disease Sister   . Breast cancer Paternal Aunt   . Breast cancer      pat. great aunt  . Colon cancer Neg Hx   . Irritable bowel syndrome Daughter   . Diabetes Maternal Grandfather   . Diabetes Paternal Uncle   . Epilepsy Son     Social History Social History  Substance Use Topics  . Smoking status: Current Some Day Smoker  . Smokeless tobacco: Never Used  . Alcohol Use: 0.0 oz/week    0 Standard drinks or equivalent per week     Comment: occas    Allergies  Allergen Reactions  . Shellfish Allergy     Current Outpatient Prescriptions  Medication Sig Dispense Refill  . b complex vitamins tablet Take 1 tablet by mouth daily.    . ergocalciferol (VITAMIN D2) 50000 UNITS capsule Take 50,000 Units by mouth every 30 (thirty) days.     No current facility-administered medications for this visit.    Review of Systems Review of  Systems  Constitutional: Negative.   Respiratory: Negative.   Cardiovascular: Negative.     Blood pressure 110/68, pulse 76, resp. rate 14, height 5\' 5"  (1.651 m), weight 197 lb (89.359 kg), last menstrual period 01/27/2000.  Physical Exam Physical Exam  Constitutional: She is oriented to person, place, and time. She appears well-developed and well-nourished.  HENT:  Mouth/Throat: Oropharynx is clear and moist.  Eyes: Conjunctivae are normal. No scleral icterus.  Neck: Neck supple.  Cardiovascular: Normal rate, regular rhythm and normal heart sounds.   Pulmonary/Chest: Effort normal and breath sounds normal. Right breast exhibits no inverted nipple, no mass, no nipple discharge, no skin change and no tenderness. Left breast exhibits no inverted nipple, no mass, no nipple discharge, no skin change and no tenderness.  Mild firmness left breast.  Abdominal: Soft. There is no hepatomegaly. There is no tenderness.  Lymphadenopathy:    She has no cervical  adenopathy.    She has no axillary adenopathy.  Neurological: She is alert and oriented to person, place, and time.  Skin: Skin is warm and dry.  Psychiatric: Her behavior is normal.    Data Reviewed M<ammogram and ultrasound reviewed and stable.  Assessment    Stable physical exam. Strong FH of breast cancer.     Plan    The patient is aware to call back for any questions or concerns. Continue self breast exams. Call office for any new breast issues or concerns. The patient has been asked to return to the office in one year with a bilateral screening mammogram.       PCP: Ria Bush, MD  Christene Lye 09/02/2014, 11:33 AM

## 2014-09-02 ENCOUNTER — Encounter: Payer: Self-pay | Admitting: General Surgery

## 2015-02-21 ENCOUNTER — Ambulatory Visit (INDEPENDENT_AMBULATORY_CARE_PROVIDER_SITE_OTHER): Payer: BLUE CROSS/BLUE SHIELD | Admitting: Family Medicine

## 2015-02-21 ENCOUNTER — Encounter: Payer: Self-pay | Admitting: Family Medicine

## 2015-02-21 VITALS — BP 100/76 | HR 121 | Temp 99.0°F | Wt 195.0 lb

## 2015-02-21 DIAGNOSIS — J45901 Unspecified asthma with (acute) exacerbation: Secondary | ICD-10-CM

## 2015-02-21 DIAGNOSIS — J209 Acute bronchitis, unspecified: Secondary | ICD-10-CM

## 2015-02-21 DIAGNOSIS — R509 Fever, unspecified: Secondary | ICD-10-CM

## 2015-02-21 DIAGNOSIS — J020 Streptococcal pharyngitis: Secondary | ICD-10-CM | POA: Diagnosis not present

## 2015-02-21 LAB — POCT INFLUENZA A/B
Influenza A, POC: NEGATIVE
Influenza B, POC: NEGATIVE

## 2015-02-21 LAB — POCT RAPID STREP A (OFFICE): Rapid Strep A Screen: POSITIVE — AB

## 2015-02-21 MED ORDER — IPRATROPIUM BROMIDE 0.02 % IN SOLN
0.5000 mg | Freq: Once | RESPIRATORY_TRACT | Status: AC
  Administered 2015-02-21: 0.5 mg via RESPIRATORY_TRACT

## 2015-02-21 MED ORDER — ALBUTEROL SULFATE (2.5 MG/3ML) 0.083% IN NEBU: 2.5000 mg | INHALATION_SOLUTION | Freq: Once | RESPIRATORY_TRACT | Status: DC

## 2015-02-21 MED ORDER — ALBUTEROL SULFATE (2.5 MG/3ML) 0.083% IN NEBU
2.5000 mg | INHALATION_SOLUTION | Freq: Once | RESPIRATORY_TRACT | Status: AC
  Administered 2015-02-21: 2.5 mg via RESPIRATORY_TRACT

## 2015-02-21 MED ORDER — ALBUTEROL SULFATE HFA 108 (90 BASE) MCG/ACT IN AERS: 2.0000 | INHALATION_SPRAY | Freq: Four times a day (QID) | RESPIRATORY_TRACT | Status: DC | PRN

## 2015-02-21 MED ORDER — IPRATROPIUM-ALBUTEROL 0.5-2.5 (3) MG/3ML IN SOLN: 3.0000 mL | Freq: Once | RESPIRATORY_TRACT | Status: DC

## 2015-02-21 MED ORDER — IPRATROPIUM BROMIDE 0.02 % IN SOLN: 0.5000 mg | Freq: Once | RESPIRATORY_TRACT | Status: DC

## 2015-02-21 MED ORDER — AMOXICILLIN 875 MG PO TABS: 875.0000 mg | ORAL_TABLET | Freq: Two times a day (BID) | ORAL | Status: DC

## 2015-02-21 MED ORDER — GUAIFENESIN-CODEINE 100-10 MG/5ML PO SYRP: 5.0000 mL | ORAL_SOLUTION | Freq: Two times a day (BID) | ORAL | Status: DC | PRN

## 2015-02-21 MED ORDER — PREDNISONE 20 MG PO TABS: ORAL_TABLET | ORAL | Status: DC

## 2015-02-21 NOTE — Assessment & Plan Note (Signed)
RST faintly positive Treat with amoxicillin course. See above for further plan.

## 2015-02-21 NOTE — Progress Notes (Signed)
Pre visit review using our clinic review tool, if applicable. No additional management support is needed unless otherwise documented below in the visit note. 

## 2015-02-21 NOTE — Patient Instructions (Addendum)
Flu test negative today Strep test positive today  You received breathing treatment today. I think you have asthma exacerbation with acute bronchitis PLUS strep throat - treat with albuterol rescue inhaler, codeine cough syrup, amoxicillin antibiotic and prednisone course.  Return if worsening productive cough, worsening fever, worsening shortness of breath, or not improving as expected.  Good to see you today, call us with questions.

## 2015-02-21 NOTE — Progress Notes (Signed)
BP 100/76 mmHg  Pulse 121  Temp(Src) 99 F (37.2 C) (Oral)  Wt 195 lb (88.451 kg)  SpO2 97%  LMP 01/27/2000   CC: cough with fever  Subjective:    Patient ID: Cassie [name], female    DOB: 08-May-1970, 45 y.o.   MRN: [id number]  HPI: Cassie [name] is a 45 y.o. female presenting on 02/21/2015 for Fever; Cough; Nasal Congestion; and Shortness of Breath   4d h/o cough, congestion, ST, fever (Tmax 101.5), dyspnea and wheezing. coughing fits productive of thick yellow mucous. + PNdrainage of clear mucous. Cough keeping her up. Mild sinus pressure headache, + ear or tooth pain. + body aches some.   No GI sxs.  Taking theraflu tea, dayquil, nyquil, tussinex cough syrup.   Both children sick with flu at home. Everyone at work sick ast well.  + seasonal asthma. No smokers at home.   Relevant past medical, surgical, family and social history reviewed and updated as indicated. Interim medical history since our last visit reviewed. Allergies and medications reviewed and updated. Current Outpatient Prescriptions on File Prior to Visit  Medication Sig  . b complex vitamins tablet Take 1 tablet by mouth daily.  . ergocalciferol (VITAMIN D2) 50000 UNITS capsule Take 50,000 Units by mouth every 30 (thirty) days.   No current facility-administered medications on file prior to visit.    Review of Systems Per HPI unless specifically indicated in ROS section     Objective:    BP 100/76 mmHg  Pulse 121  Temp(Src) 99 F (37.2 C) (Oral)  Wt 195 lb (88.451 kg)  SpO2 97%  LMP 01/27/2000  Wt Readings from Last 3 Encounters:  02/21/15 195 lb (88.451 kg)  08/31/14 197 lb (89.359 kg)  06/02/14 189 lb 6.4 oz (85.911 kg)    Physical Exam  Constitutional: She appears well-developed and well-nourished. No distress.  HENT:  Head: Normocephalic and atraumatic.  Right Ear: Hearing, tympanic membrane, external ear and ear canal normal.  Left Ear: Hearing, tympanic membrane, external ear  and ear canal normal.  Nose: Mucosal edema (R>L) and rhinorrhea present. Right sinus exhibits no maxillary sinus tenderness and no frontal sinus tenderness. Left sinus exhibits no maxillary sinus tenderness and no frontal sinus tenderness.  Mouth/Throat: Uvula is midline and mucous membranes are normal. Posterior oropharyngeal edema and posterior oropharyngeal erythema present. No oropharyngeal exudate or tonsillar abscesses.  Eyes: Conjunctivae and EOM are normal. Pupils are equal, round, and reactive to light. No scleral icterus.  Neck: Normal range of motion. Neck supple.  Cardiovascular: Normal rate, regular rhythm, normal heart sounds and intact distal pulses.   No murmur heard. Pulmonary/Chest: Effort normal. No respiratory distress. She has decreased breath sounds. She has no wheezes. She has no rhonchi. She has no rales.  Tight air movement throughout  Lymphadenopathy:    She has no cervical adenopathy.  Skin: Skin is warm and dry. No rash noted.  Nursing note and vitals reviewed.  After albuterol/atrovent neb: Improved air movement, no significant wheezing or rales  Results for orders placed or performed in visit on 02/21/15  Influenza A/B  Result Value Ref Range   Influenza A, POC Negative Negative   Influenza B, POC Negative Negative      Assessment & Plan:   Problem List Items Addressed This Visit    Strep pharyngitis    RST faintly positive Treat with amoxicillin course. See above for further plan.      Acute bronchitis with asthma with  acute exacerbation - Primary    Anticipate bronchitis has led to acute asthma exacerbation.  Alb/atrovent neb x1 in office. Treat with amox, prednisone, Rx cough syrup. Rescue albuterol inhaler sent to pharmacy as well. Update if not improving with treatment. RST positive Flu swab negative      Relevant Medications   predniSONE (DELTASONE) 20 MG tablet   albuterol (PROVENTIL HFA;VENTOLIN HFA) 108 (90 Base) MCG/ACT inhaler      Other Visit Diagnoses    Fever, unspecified        Relevant Orders    Influenza A/B (Completed)        Follow up plan: Return if symptoms worsen or fail to improve.

## 2015-02-21 NOTE — Assessment & Plan Note (Addendum)
Anticipate bronchitis has led to acute asthma exacerbation.  Alb/atrovent neb x1 in office. Treat with amox, prednisone, Rx cough syrup. Rescue albuterol inhaler sent to pharmacy as well. Update if not improving with treatment. RST positive Flu swab negative

## 2015-02-21 NOTE — Addendum Note (Signed)
Addended by: Helene Shoe on: 02/21/2015 02:06 PM   Modules accepted: Orders

## 2015-04-19 DIAGNOSIS — L821 Other seborrheic keratosis: Secondary | ICD-10-CM | POA: Diagnosis not present

## 2015-04-19 DIAGNOSIS — T07 Unspecified multiple injuries: Secondary | ICD-10-CM | POA: Diagnosis not present

## 2015-04-19 DIAGNOSIS — D229 Melanocytic nevi, unspecified: Secondary | ICD-10-CM | POA: Diagnosis not present

## 2015-04-19 DIAGNOSIS — Z1283 Encounter for screening for malignant neoplasm of skin: Secondary | ICD-10-CM | POA: Diagnosis not present

## 2015-06-03 ENCOUNTER — Other Ambulatory Visit: Payer: Self-pay

## 2015-06-03 DIAGNOSIS — Z1231 Encounter for screening mammogram for malignant neoplasm of breast: Secondary | ICD-10-CM

## 2015-08-22 ENCOUNTER — Ambulatory Visit
Admission: RE | Admit: 2015-08-22 | Discharge: 2015-08-22 | Disposition: A | Discharge status: Home / Self Care | Payer: BLUE CROSS/BLUE SHIELD | Source: Ambulatory Visit | Attending: General Surgery | Admitting: General Surgery

## 2015-08-22 ENCOUNTER — Other Ambulatory Visit: Payer: Self-pay | Admitting: General Surgery

## 2015-08-22 DIAGNOSIS — Z1231 Encounter for screening mammogram for malignant neoplasm of breast: Secondary | ICD-10-CM

## 2015-08-24 ENCOUNTER — Encounter: Payer: Self-pay | Admitting: *Deleted

## 2015-08-30 ENCOUNTER — Encounter: Payer: Self-pay | Admitting: General Surgery

## 2015-08-30 ENCOUNTER — Ambulatory Visit (INDEPENDENT_AMBULATORY_CARE_PROVIDER_SITE_OTHER): Payer: BLUE CROSS/BLUE SHIELD | Admitting: General Surgery

## 2015-08-30 VITALS — BP 124/82 | HR 68 | Resp 12 | Ht 64.0 in | Wt 196.0 lb

## 2015-08-30 DIAGNOSIS — Z9889 Other specified postprocedural states: Secondary | ICD-10-CM

## 2015-08-30 DIAGNOSIS — Z803 Family history of malignant neoplasm of breast: Secondary | ICD-10-CM

## 2015-08-30 NOTE — Progress Notes (Signed)
Patient ID: Cassie [name], female   DOB: 1970/12/24, 45 y.o.   MRN: [id number]  Chief Complaint  Patient presents with  . Follow-up    mammogram    HPI Cassie [name] is a 45 y.o. female.  who presents for a breast evaluation. The most recent mammogram was done on 08-23-15.  Patient does perform regular self breast checks and gets regular mammograms done.    No new breast issues. I have reviewed the history of present illness with the patient.  HPI  Past Medical History:  Diagnosis Date  . Allergy   . Asthma   . Cardiac disease   . Diffuse cystic mastopathy   . GERD (gastroesophageal reflux disease)   . Guttate psoriasis   . H/O syncope    cards work-up WNL (Hochrein) thought 2/2 overmedication vs arrhythmias  . Hx of endometriosis    hysterectomy  . Hx of migraines    occasional; improved since hysterectomy  . Labral tear of shoulder 02/2014   R, pending surgery Tamera Punt)  . Rosacea    mild    Past Surgical History:  Procedure Laterality Date  . ABDOMINAL HYSTERECTOMY    . BREAST BIOPSY  2010   left; core biopsy  . BREAST EXCISIONAL BIOPSY Bilateral    multiple bilateral  . BREAST MASS EXCISION  2008  . BREAST SURGERY Right    excision of lesion  . CARDIAC CATHETERIZATION  2007  . CARDIAC CATHETERIZATION    . CHOLECYSTECTOMY  2008  . COLONOSCOPY  2006   WNL Nicolasa Ducking)  . COLONOSCOPY  06/2013   mod diverticulosis, rpt 10 yrs Henrene Pastor)  . LAPAROSCOPIC HYSTERECTOMY  2002   Dr. Kyra Manges [facility]  . LAPAROSCOPY  1994   endometriosis  . TONSILLECTOMY  1994  . TONSILLECTOMY AND ADENOIDECTOMY  1995    Family History  Problem Relation Age of Onset  . Skin cancer Mother     melanoma  . Hypertension Mother   . Irritable bowel syndrome Mother   . Breast cancer Mother 55  . Coronary artery disease Father     age 42's  . Breast cancer Maternal Grandmother 40  . Stroke Maternal Grandmother   . Other Son     hyperinsulinemic  . Coronary artery disease  Paternal Uncle     age 32's  . Skin cancer Sister 24    melanoma - cancer in eye and uterus  . Thyroid disease Sister   . Breast cancer Paternal Aunt   . Breast cancer      pat. great aunt  . Irritable bowel syndrome Daughter   . Diabetes Maternal Grandfather   . Diabetes Paternal Uncle   . Epilepsy Son   . Breast cancer Cousin   . Colon cancer Neg Hx     Social History Social History  Substance Use Topics  . Smoking status: Former Smoker    Quit date: 02/21/2007  . Smokeless tobacco: Never Used  . Alcohol use 0.0 oz/week     Comment: occas    Allergies  Allergen Reactions  . Shellfish Allergy     No current outpatient prescriptions on file.   No current facility-administered medications for this visit.     Review of Systems Review of Systems  Constitutional: Negative.   Respiratory: Negative.   Cardiovascular: Negative.     Blood pressure 124/82, pulse 68, resp. rate 12, height 5\' 4"  (1.626 m), weight 196 lb (88.9 kg), last menstrual period 01/27/2000.  Physical Exam Physical Exam  Constitutional: She is oriented to person, place, and time. She appears well-developed and well-nourished.  Eyes: Conjunctivae are normal. No scleral icterus.  Neck: Neck supple.  Cardiovascular: Normal rate, regular rhythm and normal heart sounds.   Pulmonary/Chest: Effort normal and breath sounds normal. Right breast exhibits no inverted nipple, no mass, no nipple discharge, no skin change and no tenderness. Left breast exhibits no inverted nipple, no mass, no nipple discharge, no skin change and no tenderness.  Abdominal: Soft. Normal appearance and bowel sounds are normal. There is no hepatomegaly. There is no tenderness. No hernia.  Lymphadenopathy:    She has no cervical adenopathy.    She has no axillary adenopathy.  Neurological: She is alert and oriented to person, place, and time.  Skin: Skin is warm and dry.    Data Reviewed  Mammogram reviewed  Assessment     Stable physical exam. Strong FH of breast cancer.History of breast biopsies    Plan      The patient has been asked to return to the office in one year with a bilateral screening mammogram.     This information has been scribed by Karie Fetch RN, BSN,BC.   SANKAR,SEEPLAPUTHUR G 08/30/2015, 5:32 PM

## 2015-08-30 NOTE — Patient Instructions (Signed)
The patient has been asked to return to the office in one year with a bilateral screening mammogram. 

## 2016-01-20 ENCOUNTER — Encounter: Payer: Self-pay | Admitting: Family Medicine

## 2016-01-20 ENCOUNTER — Telehealth: Payer: Self-pay | Admitting: Family Medicine

## 2016-01-20 ENCOUNTER — Ambulatory Visit (INDEPENDENT_AMBULATORY_CARE_PROVIDER_SITE_OTHER): Payer: BLUE CROSS/BLUE SHIELD | Admitting: Family Medicine

## 2016-01-20 VITALS — BP 108/76 | HR 86 | Temp 98.5°F | Wt 204.0 lb

## 2016-01-20 DIAGNOSIS — J45909 Unspecified asthma, uncomplicated: Secondary | ICD-10-CM | POA: Insufficient documentation

## 2016-01-20 DIAGNOSIS — J111 Influenza due to unidentified influenza virus with other respiratory manifestations: Secondary | ICD-10-CM

## 2016-01-20 DIAGNOSIS — J4521 Mild intermittent asthma with (acute) exacerbation: Secondary | ICD-10-CM | POA: Diagnosis not present

## 2016-01-20 MED ORDER — ALBUTEROL SULFATE HFA 108 (90 BASE) MCG/ACT IN AERS: 2.0000 | INHALATION_SPRAY | Freq: Four times a day (QID) | RESPIRATORY_TRACT | 3 refills | Status: DC | PRN

## 2016-01-20 MED ORDER — AZITHROMYCIN 250 MG PO TABS: ORAL_TABLET | ORAL | 0 refills | Status: DC

## 2016-01-20 MED ORDER — GUAIFENESIN-CODEINE 100-10 MG/5ML PO SYRP: 5.0000 mL | ORAL_SOLUTION | Freq: Three times a day (TID) | ORAL | 0 refills | Status: DC | PRN

## 2016-01-20 MED ORDER — HYDROCODONE-HOMATROPINE 5-1.5 MG/5ML PO SYRP: 5.0000 mL | ORAL_SOLUTION | Freq: Two times a day (BID) | ORAL | 0 refills | Status: DC | PRN

## 2016-01-20 NOTE — Patient Instructions (Addendum)
I think you do have initial flu like illness with persistent bronchitis - treat with zpack antibiotic and albuterol inhaler, codeine cough syrup for night time, and lots of fluids and rest Watch for fever >101, worsening productive cough, or not improving with treatment. Let us know if this happens.

## 2016-01-20 NOTE — Assessment & Plan Note (Signed)
Anticipate initial influenza like illness with resultant acute bronchitis - given comorbidities of bronchial asthma, treat with zpack, refilled albuterol, cheratussin cough syrup for night time.  No prednisone at this time given minimal wheezing. Further supportive care reviewed with patient as well as indications to seek further care.  Pt agrees with plan.

## 2016-01-20 NOTE — Progress Notes (Signed)
Pre visit review using our clinic review tool, if applicable. No additional management support is needed unless otherwise documented below in the visit note. 

## 2016-01-20 NOTE — Telephone Encounter (Signed)
Tried to call pt on cell and at home with no answer because we have a Hycodan rx for her. It cannot be called in.   A very kind office staff member is going to take the rx to the pharmacy for the pt. Left a message for the pt to let her know.

## 2016-01-20 NOTE — Telephone Encounter (Signed)
Spoke to [facility] at [facility]. Unless your computer will let you escribe a controlled like Hycodan, the pt will need a written rx and we close in 3 minutes.

## 2016-01-20 NOTE — Progress Notes (Signed)
BP 108/76   Pulse 86   Temp 98.5 F (36.9 C) (Oral)   Wt 204 lb (92.5 kg)   LMP 01/27/2000   SpO2 95%   BMI 35.02 kg/m    CC: cough Subjective:    Patient ID: Cassie [name], female    DOB: 11-27-1970, 46 y.o.   MRN: [id number]  HPI: Cassie [name] is a 46 y.o. female presenting on 01/20/2016 for Cough   1+ wk h/o malaise, fever to 102 (out of work late last week and early this week), cough, congestion. Some wheezing and dyspnea. Hoarse voice. Non productive cough. Fatigued/tired. Some chest pressure. Chest > head congestion. Some ST, PNdrainage. Sinus pressure headaches.   No ear or tooth pain.   Treating with theraflu, mucinex. Started some left over amoxicillin antibiotic x 3 days.  + sick contacts at work.  Non smoker.  Seasonal bronchial asthma.  Did not receive flu shot this year.   Relevant past medical, surgical, family and social history reviewed and updated as indicated. Interim medical history since our last visit reviewed. Allergies and medications reviewed and updated. No current outpatient prescriptions on file prior to visit.   No current facility-administered medications on file prior to visit.     Review of Systems Per HPI unless specifically indicated in ROS section     Objective:    BP 108/76   Pulse 86   Temp 98.5 F (36.9 C) (Oral)   Wt 204 lb (92.5 kg)   LMP 01/27/2000   SpO2 95%   BMI 35.02 kg/m   Wt Readings from Last 3 Encounters:  01/20/16 204 lb (92.5 kg)  08/30/15 196 lb (88.9 kg)  02/21/15 195 lb (88.5 kg)    Physical Exam  Constitutional: She appears well-developed and well-nourished. No distress.  HENT:  Head: Normocephalic and atraumatic.  Right Ear: Hearing, tympanic membrane, external ear and ear canal normal.  Left Ear: Hearing, tympanic membrane, external ear and ear canal normal.  Nose: Mucosal edema (nasal mucosal congestion) and rhinorrhea present. Right sinus exhibits maxillary sinus tenderness. Right sinus  exhibits no frontal sinus tenderness. Left sinus exhibits maxillary sinus tenderness. Left sinus exhibits no frontal sinus tenderness.  Mouth/Throat: Uvula is midline, oropharynx is clear and moist and mucous membranes are normal. No oropharyngeal exudate, posterior oropharyngeal edema, posterior oropharyngeal erythema or tonsillar abscesses.  Eyes: Conjunctivae and EOM are normal. Pupils are equal, round, and reactive to light. No scleral icterus.  Neck: Normal range of motion. Neck supple.  Cardiovascular: Normal rate, regular rhythm, normal heart sounds and intact distal pulses.   No murmur heard. Pulmonary/Chest: Effort normal. No respiratory distress. She has wheezes (minimal). She has no rales.  Coarse but overall clear  Musculoskeletal: She exhibits no edema.  Lymphadenopathy:    She has no cervical adenopathy.  Skin: Skin is warm and dry. No rash noted.  Psychiatric: She has a normal mood and affect.  Nursing note and vitals reviewed.     Assessment & Plan:   Problem List Items Addressed This Visit    Bronchitis with influenza - Primary    Anticipate initial influenza like illness with resultant acute bronchitis - given comorbidities of bronchial asthma, treat with zpack, refilled albuterol, cheratussin cough syrup for night time.  No prednisone at this time given minimal wheezing. Further supportive care reviewed with patient as well as indications to seek further care.  Pt agrees with plan.      Relevant Medications   azithromycin (ZITHROMAX) 250  MG tablet   Extrinsic asthma   Relevant Medications   albuterol (PROVENTIL HFA;VENTOLIN HFA) 108 (90 Base) MCG/ACT inhaler       Follow up plan: Return if symptoms worsen or fail to improve.  Ria Bush, MD

## 2016-01-20 NOTE — Telephone Encounter (Signed)
Cassie [name] is calling because the medication prescribed today is on a recall - Cheratussin AC.  Pt is already taking mucinex.  They want to know what should be calling in instead.  Can you please call them.

## 2016-01-20 NOTE — Telephone Encounter (Signed)
Will they accept hycodan cough syrup? If not patient can come pick up Rx.

## 2016-02-09 ENCOUNTER — Ambulatory Visit (INDEPENDENT_AMBULATORY_CARE_PROVIDER_SITE_OTHER): Payer: BLUE CROSS/BLUE SHIELD | Admitting: Family Medicine

## 2016-02-09 ENCOUNTER — Observation Stay (HOSPITAL_COMMUNITY)
Admission: EM | Admit: 2016-02-09 | Discharge: 2016-02-10 | Disposition: A | Discharge status: Home / Self Care | Payer: BLUE CROSS/BLUE SHIELD | Attending: Internal Medicine | Admitting: Internal Medicine

## 2016-02-09 ENCOUNTER — Encounter (HOSPITAL_COMMUNITY): Payer: Self-pay

## 2016-02-09 ENCOUNTER — Emergency Department (HOSPITAL_COMMUNITY): Payer: BLUE CROSS/BLUE SHIELD

## 2016-02-09 ENCOUNTER — Encounter: Payer: Self-pay | Admitting: Family Medicine

## 2016-02-09 VITALS — BP 130/94 | HR 105 | Temp 98.2°F | Wt 202.2 lb

## 2016-02-09 DIAGNOSIS — Z7982 Long term (current) use of aspirin: Secondary | ICD-10-CM | POA: Insufficient documentation

## 2016-02-09 DIAGNOSIS — Z87891 Personal history of nicotine dependence: Secondary | ICD-10-CM | POA: Diagnosis not present

## 2016-02-09 DIAGNOSIS — J45909 Unspecified asthma, uncomplicated: Secondary | ICD-10-CM | POA: Insufficient documentation

## 2016-02-09 DIAGNOSIS — R072 Precordial pain: Principal | ICD-10-CM | POA: Insufficient documentation

## 2016-02-09 DIAGNOSIS — Z955 Presence of coronary angioplasty implant and graft: Secondary | ICD-10-CM | POA: Diagnosis not present

## 2016-02-09 DIAGNOSIS — R079 Chest pain, unspecified: Secondary | ICD-10-CM

## 2016-02-09 LAB — CBC
HCT: 39.7 % (ref 36.0–46.0)
HCT: 38.8 % (ref 36.0–46.0)
Hemoglobin: 13.6 g/dL (ref 12.0–15.0)
Hemoglobin: 13.1 g/dL (ref 12.0–15.0)
MCH: 32.3 pg (ref 26.0–34.0)
MCH: 32 pg (ref 26.0–34.0)
MCHC: 34.3 g/dL (ref 30.0–36.0)
MCHC: 33.8 g/dL (ref 30.0–36.0)
MCV: 94.3 fL (ref 78.0–100.0)
MCV: 94.9 fL (ref 78.0–100.0)
Platelets: 215 10*3/uL (ref 150–400)
Platelets: 209 10*3/uL (ref 150–400)
RBC: 4.21 MIL/uL (ref 3.87–5.11)
RBC: 4.09 MIL/uL (ref 3.87–5.11)
RDW: 12.9 % (ref 11.5–15.5)
RDW: 13 % (ref 11.5–15.5)
WBC: 6.1 10*3/uL (ref 4.0–10.5)
WBC: 5.5 10*3/uL (ref 4.0–10.5)

## 2016-02-09 LAB — BASIC METABOLIC PANEL
Anion gap: 8 (ref 5–15)
BUN: 14 mg/dL (ref 6–20)
CO2: 25 mmol/L (ref 22–32)
Calcium: 9.4 mg/dL (ref 8.9–10.3)
Chloride: 106 mmol/L (ref 101–111)
Creatinine, Ser: 1.03 mg/dL — ABNORMAL HIGH (ref 0.44–1.00)
GFR calc Af Amer: >60 mL/min (ref >60)
GFR calc non Af Amer: >60 mL/min (ref >60)
Glucose, Bld: 84 mg/dL (ref 65–99)
Potassium: 4.4 mmol/L (ref 3.5–5.1)
Sodium: 139 mmol/L (ref 135–145)

## 2016-02-09 LAB — CREATININE, SERUM
Creatinine, Ser: 0.98 mg/dL (ref 0.44–1.00)
GFR calc Af Amer: >60 mL/min (ref >60)
GFR calc non Af Amer: >60 mL/min (ref >60)

## 2016-02-09 LAB — I-STAT TROPONIN, ED: Troponin i, poc: 0 ng/mL (ref 0.00–0.08)

## 2016-02-09 LAB — MAGNESIUM: Magnesium: 2.4 mg/dL (ref 1.7–2.4)

## 2016-02-09 LAB — D-DIMER, QUANTITATIVE: D-Dimer, Quant: <0.27 ug/mL-FEU (ref 0.00–0.50)

## 2016-02-09 LAB — TROPONIN I: Troponin I: <0.03 ng/mL (ref <0.03)

## 2016-02-09 MED ORDER — ASPIRIN 81 MG PO CHEW
243.0000 mg | CHEWABLE_TABLET | Freq: Once | ORAL | Status: AC
  Administered 2016-02-09: 243 mg via ORAL

## 2016-02-09 MED ORDER — SODIUM CHLORIDE 0.9 % IV BOLUS (SEPSIS)
1000.0000 mL | Freq: Once | INTRAVENOUS | Status: AC
  Administered 2016-02-09: 1000 mL via INTRAVENOUS

## 2016-02-09 MED ORDER — NITROGLYCERIN 0.4 MG SL SUBL
0.4000 mg | SUBLINGUAL_TABLET | SUBLINGUAL | Status: DC | PRN
  Administered 2016-02-09 (×2): 0.4 mg via SUBLINGUAL
  Filled 2016-02-09: qty 1

## 2016-02-09 MED ORDER — ONDANSETRON HCL 4 MG/2ML IJ SOLN: 4.0000 mg | Freq: Four times a day (QID) | INTRAMUSCULAR | Status: DC | PRN

## 2016-02-09 MED ORDER — NITROGLYCERIN 0.4 MG SL SUBL: 0.4000 mg | SUBLINGUAL_TABLET | SUBLINGUAL | Status: DC | PRN

## 2016-02-09 MED ORDER — ASPIRIN EC 325 MG PO TBEC
325.0000 mg | DELAYED_RELEASE_TABLET | Freq: Every day | ORAL | Status: DC
  Administered 2016-02-10: 325 mg via ORAL
  Filled 2016-02-09: qty 1

## 2016-02-09 MED ORDER — NITROGLYCERIN 0.4 MG SL SUBL
0.4000 mg | SUBLINGUAL_TABLET | Freq: Once | SUBLINGUAL | Status: AC
  Administered 2016-02-09: 0.4 mg via SUBLINGUAL

## 2016-02-09 MED ORDER — ENOXAPARIN SODIUM 40 MG/0.4ML ~~LOC~~ SOLN
40.0000 mg | Freq: Every day | SUBCUTANEOUS | Status: DC
  Administered 2016-02-09: 40 mg via SUBCUTANEOUS
  Filled 2016-02-09: qty 0.4

## 2016-02-09 MED ORDER — ACETAMINOPHEN 325 MG PO TABS: 650.0000 mg | ORAL_TABLET | ORAL | Status: DC | PRN

## 2016-02-09 NOTE — ED Triage Notes (Signed)
[facility]- pt coming from MD office with complaint of chest pain. Pt reports it began around noon. Some associated SOB and nausea. 324mg  of aspirin, 2 ntg, 4mg  of zofran.

## 2016-02-09 NOTE — Assessment & Plan Note (Signed)
Concerning sudden onset chest pain with dyspnea that is ongoing although EKG stable. Strong fmhx premature CAD. Will need emergent eval at ER via EMS to r/o ACS, PE, other acute cause of chest pain.   Chest tightness responsive to SL nitro. Provided with 243mg  chewable aspirin (pt took 81mg  earlier today).

## 2016-02-09 NOTE — Addendum Note (Signed)
Addended by: Royann Shivers A on: 02/09/2016 04:50 PM   Modules accepted: Orders

## 2016-02-09 NOTE — ED Notes (Signed)
Patient transported to X-ray 

## 2016-02-09 NOTE — Progress Notes (Signed)
BP (!) 130/94   Pulse (!) 105   Temp 98.2 F (36.8 C) (Oral)   Wt 202 lb 4 oz (91.7 kg)   LMP 01/27/2000   SpO2 97%   BMI 34.72 kg/m    CC: chest pain Subjective:    Patient ID: Cassie [name], female    DOB: 1970/09/19, 46 y.o.   MRN: [id number]  HPI: Cassie [name] is a 46 y.o. female presenting on 02/09/2016 for Chest Pain (started in left shoulder about 2 hours ago)   Walk in patient.  Acute onset L shoulderblade pain 2 hours ago, associated with dyspnea and indigestion. Over the past hour pain has localized to left chest described as tightness. Ongoing chest tightness associated with dyspnea. Earlier today bp 130/80s, HR 121. Non exertional. No better with rest.   H/o asthma but this feels different.  No further indigestion, GERD.  No recent prolonged immobility or travel, not on HRT.   She did take aspirin 81mg  daily today.   She had recurrent syncope 10 yrs ago. H/o cardiac catheterization 2007 (Hochrein) - records unavailable, told normal. She wore 18 mo internal monitor which was unrevealing.   fmhx premature CAD - father and 5 paternal uncles/aunts age 84s/50s.   Relevant past medical, surgical, family and social history reviewed and updated as indicated. Interim medical history since our last visit reviewed. Allergies and medications reviewed and updated. No current outpatient prescriptions on file prior to visit.   No current facility-administered medications on file prior to visit.    Past Medical History:  Diagnosis Date  . Allergy   . Asthma   . Cardiac disease   . Diffuse cystic mastopathy   . GERD (gastroesophageal reflux disease)   . Guttate psoriasis   . H/O syncope    cards work-up WNL (Hochrein) thought 2/2 overmedication vs arrhythmias  . Hx of endometriosis    hysterectomy  . Hx of migraines    occasional; improved since hysterectomy  . Labral tear of shoulder 02/2014   R, pending surgery Cassie [name])  . Rosacea    mild    Past Surgical  History:  Procedure Laterality Date  . ABDOMINAL HYSTERECTOMY    . BREAST BIOPSY  2010   left; core biopsy  . BREAST EXCISIONAL BIOPSY Bilateral    multiple bilateral  . BREAST MASS EXCISION  2008  . BREAST SURGERY Right    excision of lesion  . CARDIAC CATHETERIZATION  2007  . CARDIAC CATHETERIZATION    . CHOLECYSTECTOMY  2008  . COLONOSCOPY  2006   WNL Cassie [name])  . COLONOSCOPY  06/2013   mod diverticulosis, rpt 10 yrs Cassie [name])  . LAPAROSCOPIC HYSTERECTOMY  2002   Dr. Kyra Manges OBGYN  . LAPAROSCOPY  1994   endometriosis  . TONSILLECTOMY  1994  . TONSILLECTOMY AND ADENOIDECTOMY  1995    Family History  Problem Relation Age of Onset  . Skin cancer Mother     melanoma  . Hypertension Mother   . Irritable bowel syndrome Mother   . Breast cancer Mother 69  . Coronary artery disease Father     age 66's  . Breast cancer Maternal Grandmother 46  . Stroke Maternal Grandmother   . Other Son     hyperinsulinemic  . Coronary artery disease Paternal Uncle     age 65's  . Skin cancer Sister 49    melanoma - cancer in eye and uterus  . Thyroid disease Sister   . Breast cancer Paternal Aunt   .  Breast cancer      pat. great aunt  . Irritable bowel syndrome Daughter   . Diabetes Maternal Grandfather   . Diabetes Paternal Uncle   . Epilepsy Son   . Breast cancer Cousin   . Pulmonary embolism Other     died from this after MVA  . Colon cancer Neg Hx     Social History  Substance Use Topics  . Smoking status: Former Smoker    Quit date: 02/21/2007  . Smokeless tobacco: Never Used  . Alcohol use 0.0 oz/week     Comment: occas    Review of Systems Per HPI unless specifically indicated in ROS section     Objective:    BP (!) 130/94   Pulse (!) 105   Temp 98.2 F (36.8 C) (Oral)   Wt 202 lb 4 oz (91.7 kg)   LMP 01/27/2000   SpO2 97%   BMI 34.72 kg/m   Wt Readings from Last 3 Encounters:  02/09/16 202 lb 4 oz (91.7 kg)  01/20/16 204 lb (92.5 kg)  08/30/15 196  lb (88.9 kg)    Physical Exam  Constitutional: She appears well-developed and well-nourished. No distress.  HENT:  Mouth/Throat: Oropharynx is clear and moist. No oropharyngeal exudate.  Cardiovascular: Normal rate, regular rhythm, normal heart sounds and intact distal pulses.   No murmur heard. Pulmonary/Chest: Effort normal and breath sounds normal. No respiratory distress. She has no wheezes. She has no rales.  Musculoskeletal: She exhibits no edema.  Psychiatric: Her mood appears anxious.  Nursing note and vitals reviewed.   EKG - NSR rate 90s, normal axis, intervals, good R wave progression, no acute ST/T changes, unchanged from prior EKG 2006. ?p mitrale - new  0.4mg  SL nitro - chest tightness improved.     Assessment & Plan:   Problem List Items Addressed This Visit    Chest pain at rest - Primary    Concerning sudden onset chest pain with dyspnea that is ongoing although EKG stable. Strong fmhx premature CAD. Will need emergent eval at ER via EMS to r/o ACS, PE, other acute cause of chest pain.   Chest tightness responsive to SL nitro. Provided with 243mg  chewable aspirin (pt took 81mg  earlier today).       Relevant Medications   nitroGLYCERIN (NITROSTAT) SL tablet 0.4 mg (Completed) (Start on 02/09/2016  4:30 PM)   Other Relevant Orders   EKG 12-Lead (Completed)       Follow up plan: No Follow-up on file.  Cassie Bush, MD

## 2016-02-09 NOTE — H&P (Signed)
History and Physical    Cassie [name] [id number] DOB: [birth year] DOA: 02/09/2016  PCP: Ria Bush, MD  Patient coming from: Home.  Chief Complaint: Chest pain.  HPI: Cassie [name] is a 46 y.o. female with previous history of recurrent syncope and has had extensive workup as per the patient prior to 2010 including cardiac cath (records not available) presents to the ER because of chest pain. Patient started experiencing chest pain around 3 PM today while at work. Pain initially was in the both shoulder and started moving to her chest. Pain is pressure-like nonradiating with some shortness of breath. Patient also had mild nausea. Patient went to her PCPs office and was given aspirin and sublingual nitroglycerin. Following administration of sublingual nitroglycerin patient's chest pain subsided and was brought to the ER. Cardiac markers EKG, chest x-ray and d-dimer were unremarkable. Patient has strong family history of CAD with patient's dad and uncles having had myocardial infarctions in the 22s. Patient admitted for further observation.   Also noted during the initial episode when patient had chest pain patient's blood pressure was markedly elevated. Following sublingual nitroglycerin and subsiding of chest pain blood pressure has improved.  ED Course: Cardiac markers EKG d-dimer chest x-ray were unremarkable.  Review of Systems: As per HPI, rest all negative.   Past Medical History:  Diagnosis Date  . Allergy   . Asthma   . Cardiac disease   . Diffuse cystic mastopathy   . GERD (gastroesophageal reflux disease)   . Guttate psoriasis   . H/O syncope    cards work-up WNL (Hochrein) thought 2/2 overmedication vs arrhythmias  . Hx of endometriosis    hysterectomy  . Hx of migraines    occasional; improved since hysterectomy  . Labral tear of shoulder 02/2014   R, pending surgery Tamera Punt)  . Rosacea    mild    Past Surgical History:  Procedure Laterality Date  .  ABDOMINAL HYSTERECTOMY    . BREAST BIOPSY  2010   left; core biopsy  . BREAST EXCISIONAL BIOPSY Bilateral    multiple bilateral  . BREAST MASS EXCISION  2008  . BREAST SURGERY Right    excision of lesion  . CARDIAC CATHETERIZATION  2007  . CARDIAC CATHETERIZATION    . CHOLECYSTECTOMY  2008  . COLONOSCOPY  2006   WNL Nicolasa Ducking)  . COLONOSCOPY  06/2013   mod diverticulosis, rpt 10 yrs Henrene Pastor)  . LAPAROSCOPIC HYSTERECTOMY  2002   Dr. Kyra Manges OBGYN  . LAPAROSCOPY  1994   endometriosis  . TONSILLECTOMY  1994  . [place]     reports that she quit smoking about 8 years ago. She has never used smokeless tobacco. She reports that she drinks alcohol. She reports that she does not use drugs.  Allergies  Allergen Reactions  . Fish Allergy Anaphylaxis, Hives and Swelling  . Shellfish Allergy Anaphylaxis, Hives and Swelling  . Penicillins Rash and Other (See Comments)    Parents are allergic Has patient had a PCN reaction causing immediate rash, facial/tongue/throat swelling, SOB or lightheadedness with hypotension: Yes Has patient had a PCN reaction causing severe rash involving mucus membranes or skin necrosis: Unk Has patient had a PCN reaction that required hospitalization: Unk Has patient had a PCN reaction occurring within the last 10 years: No If all of the above answers are "NO", then may proceed with Cephalosporin use.No   . Tape Rash    Family History  Problem Relation Age of  Onset  . Skin cancer Mother     melanoma  . Hypertension Mother   . Irritable bowel syndrome Mother   . Breast cancer Mother 31  . Coronary artery disease Father     age 54's  . Breast cancer Maternal Grandmother 16  . Stroke Maternal Grandmother   . Other Son     hyperinsulinemic  . Coronary artery disease Paternal Uncle     age 54's  . Skin cancer Sister 39    melanoma - cancer in eye and uterus  . Thyroid disease Sister   . Breast cancer Paternal Aunt     . Breast cancer      pat. great aunt  . Irritable bowel syndrome Daughter   . Diabetes Maternal Grandfather   . Diabetes Paternal Uncle   . Epilepsy Son   . Breast cancer Cousin   . Pulmonary embolism Other     died from this after MVA  . Colon cancer Neg Hx     Prior to Admission medications   Medication Sig Start Date End Date Taking? Authorizing Provider  aspirin 81 MG chewable tablet Chew 243-324 mg by mouth once as needed (FOR CHEST PAIN(S)).   Yes Historical Provider, MD  ibuprofen (ADVIL,MOTRIN) 200 MG tablet Take 200-400 mg by mouth every 6 (six) hours as needed for headache or mild pain.   Yes Historical Provider, MD  Naproxen Sodium (ALEVE) 220 MG CAPS Take 220-440 mg by mouth 2 (two) times daily as needed (for headaches or pain).   Yes Historical Provider, MD    Physical Exam: Vitals:   02/09/16 2100 02/09/16 2115 02/09/16 2130 02/09/16 2204  BP: 118/75 118/67 124/75 130/80  Pulse: 71 76 73 73  Resp: 18 14 24    Temp:    98.3 F (36.8 C)  TempSrc:    Oral  SpO2: 96% 96% 94% 100%  Weight:    90.4 kg (199 lb 4.8 oz)  Height:    5\' 4"  (1.626 m)      Constitutional: Moderately built and nourished. Vitals:   02/09/16 2100 02/09/16 2115 02/09/16 2130 02/09/16 2204  BP: 118/75 118/67 124/75 130/80  Pulse: 71 76 73 73  Resp: 18 14 24    Temp:    98.3 F (36.8 C)  TempSrc:    Oral  SpO2: 96% 96% 94% 100%  Weight:    90.4 kg (199 lb 4.8 oz)  Height:    5\' 4"  (1.626 m)   Eyes: Anicteric no pallor. ENMT: No discharge from the ears eyes nose and mouth. Neck: No mass felt. No neck rigidity. Respiratory: No rhonchi or crepitations. Cardiovascular: S1 and S2 heard no murmurs appreciated. Abdomen: Soft nontender bowel sounds present. No guarding or rigidity. Musculoskeletal: No edema. No joint effusion. Skin: No rash. Skin appears warm. Neurologic: Alert awake oriented to time place and person. Moves all extremities. Psychiatric: Appears normal. Normal  affect.   Labs on Admission: I have personally reviewed following labs and imaging studies  CBC:  Recent Labs Lab 02/09/16 1758  WBC 6.1  HGB 13.6  HCT 39.7  MCV 94.3  PLT 123456   Basic Metabolic Panel:  Recent Labs Lab 02/09/16 1758  NA 139  K 4.4  CL 106  CO2 25  GLUCOSE 84  BUN 14  CREATININE 1.03*  CALCIUM 9.4  MG 2.4   GFR: Estimated Creatinine Clearance: 76.7 mL/min (by C-G formula based on SCr of 1.03 mg/dL (H)). Liver Function Tests: No results for input(s): AST, ALT,  ALKPHOS, BILITOT, PROT, ALBUMIN in the last 168 hours. No results for input(s): LIPASE, AMYLASE in the last 168 hours. No results for input(s): AMMONIA in the last 168 hours. Coagulation Profile: No results for input(s): INR, PROTIME in the last 168 hours. Cardiac Enzymes: No results for input(s): CKTOTAL, CKMB, CKMBINDEX, TROPONINI in the last 168 hours. BNP (last 3 results) No results for input(s): PROBNP in the last 8760 hours. HbA1C: No results for input(s): HGBA1C in the last 72 hours. CBG: No results for input(s): GLUCAP in the last 168 hours. Lipid Profile: No results for input(s): CHOL, HDL, LDLCALC, TRIG, CHOLHDL, LDLDIRECT in the last 72 hours. Thyroid Function Tests: No results for input(s): TSH, T4TOTAL, FREET4, T3FREE, THYROIDAB in the last 72 hours. Anemia Panel: No results for input(s): VITAMINB12, FOLATE, FERRITIN, TIBC, IRON, RETICCTPCT in the last 72 hours. Urine analysis: No results found for: COLORURINE, APPEARANCEUR, LABSPEC, PHURINE, GLUCOSEU, HGBUR, BILIRUBINUR, KETONESUR, PROTEINUR, UROBILINOGEN, NITRITE, LEUKOCYTESUR Sepsis Labs: @LABRCNTIP (procalcitonin:4,lacticidven:4) )No results found for this or any previous visit (from the past 240 hour(s)).   Radiological Exams on Admission: Dg Chest 2 View  Result Date: 02/09/2016 CLINICAL DATA:  LEFT-sided chest pain EXAM: CHEST  2 VIEW COMPARISON:  None. FINDINGS: Normal mediastinum and cardiac silhouette. Normal  pulmonary vasculature. No evidence of effusion, infiltrate, or pneumothorax. No acute bony abnormality. IMPRESSION: No acute cardiopulmonary process. Electronically Signed   By: Suzy Bouchard M.D.   On: 02/09/2016 19:44    EKG: Independently reviewed. Normal sinus rhythm low voltage.  Assessment/Plan Principal Problem:   Chest pain    1. Chest pain - patient has strong family history of CAD and also chest pain subsided with sublingual nitroglycerin. We'll cycle cardiac markers check 2-D echo, aspirin when necessary nitroglycerin. Will keep patient nothing by mouth in a.m. in anticipation of cardiac procedure. I have consulted cardiology. 2. Elevated blood pressure - closely follow blood pressure trends. 3. History of recurrent syncope and has had extensive workup previously as per the patient prior to 2010.   DVT prophylaxis: Lovenox. Code Status: Full code.  Family Communication: Patient's family at the bedside.  Disposition Plan: Home.  Consults called: Cardiology.  Admission status: Observation.    Rise Patience MD [facility] Pager [phone].  If 7PM-7AM, please contact night-coverage [email or web address] Password [email or web address]  02/09/2016, 10:12 PM

## 2016-02-09 NOTE — Patient Instructions (Signed)
To ER now via EMS.

## 2016-02-09 NOTE — ED Provider Notes (Signed)
[facility] DEPT Provider Note   CSN: [id number] Arrival date & time: 02/09/16  1720     History   Chief Complaint Chief Complaint  Patient presents with  . Chest Pain    HPI Cassie [name] is a 46 y.o. female.  HPI 46 yo F with PMHx of obesity, strong family h/o CAD who p/w chest pain. Pt was at work today when she experienced acute onset of dull, aching, substernal left upper chest and shoulder pain. The pain the radiated to her chest with associated SOB, diaphoresis. She took an ASA, notified her father who is a PA, and [facility], she had a HR 125 and BP was 123456 systolic. She subsequently went home, took additional ASA, and called EMS after the pain returned more sever.e She was given nitro en route with complete resolution of her pain. No current pain or SOB. No h/o known coronary disease, but has seen Dr. Percival Spanish for loop recorder int he past.  Past Medical History:  Diagnosis Date  . Allergy   . Asthma   . Cardiac disease   . Diffuse cystic mastopathy   . GERD (gastroesophageal reflux disease)   . Guttate psoriasis   . H/O syncope    cards work-up WNL (Hochrein) thought 2/2 overmedication vs arrhythmias  . Hx of endometriosis    hysterectomy  . Hx of migraines    occasional; improved since hysterectomy  . Labral tear of shoulder 02/2014   R, pending surgery Tamera Punt)  . Rosacea    mild    Patient Active Problem List   Diagnosis Date Noted  . Chest pain at rest 02/09/2016  . Chest pain 02/09/2016  . Bronchitis with influenza 01/20/2016  . Extrinsic asthma 01/20/2016  . Labral tear of shoulder 02/01/2014  . Bowel habit changes 04/28/2013  . Obesity (BMI 30-39.9) 01/09/2013  . Diffuse cystic mastopathy 08/18/2012  . Family history of breast cancer 08/18/2012  . Healthcare maintenance 02/27/2011  . Family history of melanoma 02/27/2011  . SYNCOPE, HX OF 08/11/2009  . BREAST MASS, HX OF 08/11/2009    Past Surgical History:    Procedure Laterality Date  . ABDOMINAL HYSTERECTOMY    . BREAST BIOPSY  2010   left; core biopsy  . BREAST EXCISIONAL BIOPSY Bilateral    multiple bilateral  . BREAST MASS EXCISION  2008  . BREAST SURGERY Right    excision of lesion  . CARDIAC CATHETERIZATION  2007  . CARDIAC CATHETERIZATION    . CHOLECYSTECTOMY  2008  . COLONOSCOPY  2006   WNL Nicolasa Ducking)  . COLONOSCOPY  06/2013   mod diverticulosis, rpt 10 yrs Henrene Pastor)  . LAPAROSCOPIC HYSTERECTOMY  2002   Dr. Kyra Manges OBGYN  . LAPAROSCOPY  1994   endometriosis  . TONSILLECTOMY  1994  . TONSILLECTOMY AND ADENOIDECTOMY  1995    OB History    Gravida Para Term Preterm AB Living   3 2 2   1 2    SAB TAB Ectopic Multiple Live Births   1              Obstetric Comments   1st Menstrual Cycle: 13 1st Pregnancy:  23       Home Medications    Prior to Admission medications   Medication Sig Start Date End Date Taking? Authorizing Provider  aspirin 81 MG chewable tablet Chew 243-324 mg by mouth once as needed (FOR CHEST PAIN(S)).   Yes Historical Provider, MD  ibuprofen (  ADVIL,MOTRIN) 200 MG tablet Take 200-400 mg by mouth every 6 (six) hours as needed for headache or mild pain.   Yes Historical Provider, MD  Naproxen Sodium (ALEVE) 220 MG CAPS Take 220-440 mg by mouth 2 (two) times daily as needed (for headaches or pain).   Yes Historical Provider, MD    Family History Family History  Problem Relation Age of Onset  . Skin cancer Mother     melanoma  . Hypertension Mother   . Irritable bowel syndrome Mother   . Breast cancer Mother 60  . Coronary artery disease Father     age 35's  . Breast cancer Maternal Grandmother 67  . Stroke Maternal Grandmother   . Other Son     hyperinsulinemic  . Coronary artery disease Paternal Uncle     age 5's  . Skin cancer Sister 75    melanoma - cancer in eye and uterus  . Thyroid disease Sister   . Breast cancer Paternal Aunt   . Breast cancer      pat. great aunt  .  Irritable bowel syndrome Daughter   . Diabetes Maternal Grandfather   . Diabetes Paternal Uncle   . Epilepsy Son   . Breast cancer Cousin   . Pulmonary embolism Other     died from this after MVA  . Colon cancer Neg Hx     Social History Social History  Substance Use Topics  . Smoking status: Former Smoker    Quit date: 02/21/2007  . Smokeless tobacco: Never Used  . Alcohol use 0.0 oz/week     Comment: occas     Allergies   Fish allergy; Shellfish allergy; Penicillins; and Tape   Review of Systems Review of Systems  Constitutional: Positive for fatigue. Negative for fever.  Respiratory: Positive for chest tightness.   Cardiovascular: Positive for chest pain.  Gastrointestinal: Positive for nausea.  All other systems reviewed and are negative.    Physical Exam Updated Vital Signs BP 130/80 (BP Location: Right Arm)   Pulse 73   Temp 98.3 F (36.8 C) (Oral)   Resp 24   Ht 5\' 4"  (1.626 m)   Wt 199 lb 4.8 oz (90.4 kg)   LMP 01/27/2000   SpO2 100%   BMI 34.21 kg/m   Physical Exam  Constitutional: She is oriented to person, place, and time. She appears well-developed and well-nourished. No distress.  HENT:  Head: Normocephalic and atraumatic.  Eyes: Conjunctivae are normal.  Neck: Neck supple.  Cardiovascular: Normal rate, regular rhythm and normal heart sounds.  Exam reveals no friction rub.   No murmur heard. Pulmonary/Chest: Effort normal and breath sounds normal. No respiratory distress. She has no wheezes. She has no rales.  Abdominal: She exhibits no distension.  Musculoskeletal: She exhibits no edema.  Neurological: She is alert and oriented to person, place, and time. She exhibits normal muscle tone.  Skin: Skin is warm. Capillary refill takes less than 2 seconds.  Psychiatric: She has a normal mood and affect.  Nursing note and vitals reviewed.    ED Treatments / Results  Labs (all labs ordered are listed, but only abnormal results are  displayed) Labs Reviewed  BASIC METABOLIC PANEL - Abnormal; Notable for the following:       Result Value   Creatinine, Ser 1.03 (*)    All other components within normal limits  CBC  MAGNESIUM  D-DIMER, QUANTITATIVE (NOT AT [facility])  TROPONIN I  CBC  CREATININE, SERUM  TROPONIN I  TROPONIN I  Randolm Idol, ED    EKG  EKG Interpretation  Date/Time:  Thursday February 09 2016 17:27:21 EST Ventricular Rate:  88 PR Interval:    QRS Duration: 79 QT Interval:  343 QTC Calculation: 415 R Axis:   47 Text Interpretation:  Sinus rhythm Low voltage, precordial leads No significant change since last tracing Confirmed by Ariyan Sinnett MD, Lysbeth Galas [phone]) on 02/09/2016 5:29:32 PM       Radiology Dg Chest 2 View  Result Date: 02/09/2016 CLINICAL DATA:  LEFT-sided chest pain EXAM: CHEST  2 VIEW COMPARISON:  None. FINDINGS: Normal mediastinum and cardiac silhouette. Normal pulmonary vasculature. No evidence of effusion, infiltrate, or pneumothorax. No acute bony abnormality. IMPRESSION: No acute cardiopulmonary process. Electronically Signed   By: Suzy Bouchard M.D.   On: 02/09/2016 19:44    Procedures Procedures (including critical care time)  Medications Ordered in ED Medications  acetaminophen (TYLENOL) tablet 650 mg (not administered)  ondansetron (ZOFRAN) injection 4 mg (not administered)  enoxaparin (LOVENOX) injection 40 mg (40 mg Subcutaneous Given 02/09/16 2334)  aspirin EC tablet 325 mg (not administered)  nitroGLYCERIN (NITROSTAT) SL tablet 0.4 mg (not administered)  sodium chloride 0.9 % bolus 1,000 mL (1,000 mLs Intravenous New Bag/Given 02/09/16 2003)     Initial Impression / Assessment and Plan / ED Course  I have reviewed the triage vital signs and the nursing notes.  Pertinent labs & imaging results that were available during my care of the patient were reviewed by me and considered in my medical decision making (see chart for details).    46 yo F with h/o obesity,  strong family h/o CAD here with fairly typical CP, resolved s/p nitro. EKG non-ischemic and initial trop negative. Labs o/w reassuring. However, history and family history is highly concerning, so will admit for delta trop, possible stress testing in AM. D/w Dr. Radford Pax of Cardiology who recommends Hospitalist admit.  Final Clinical Impressions(s) / ED Diagnoses   Final diagnoses:  Chest pain, unspecified type    New Prescriptions Current Discharge Medication List       Duffy Bruce, MD 02/10/16 [phone]

## 2016-02-09 NOTE — ED Notes (Signed)
Pt called RN to room to report she was having chest pain again. Pt describes the pain as a squeezing pain under her left breast. EKG captured, will administer NTG. MD aware.

## 2016-02-09 NOTE — Progress Notes (Signed)
Pre visit review using our clinic review tool, if applicable. No additional management support is needed unless otherwise documented below in the visit note. 

## 2016-02-10 ENCOUNTER — Encounter (HOSPITAL_COMMUNITY): Payer: Self-pay | Admitting: Radiology

## 2016-02-10 ENCOUNTER — Observation Stay (HOSPITAL_BASED_OUTPATIENT_CLINIC_OR_DEPARTMENT_OTHER): Payer: BLUE CROSS/BLUE SHIELD

## 2016-02-10 ENCOUNTER — Other Ambulatory Visit (HOSPITAL_COMMUNITY): Payer: BLUE CROSS/BLUE SHIELD

## 2016-02-10 DIAGNOSIS — Z8249 Family history of ischemic heart disease and other diseases of the circulatory system: Secondary | ICD-10-CM | POA: Diagnosis not present

## 2016-02-10 DIAGNOSIS — R079 Chest pain, unspecified: Secondary | ICD-10-CM

## 2016-02-10 DIAGNOSIS — R072 Precordial pain: Secondary | ICD-10-CM

## 2016-02-10 LAB — ECHOCARDIOGRAM COMPLETE
Height: 64 in
Weight: 3169.6 oz

## 2016-02-10 LAB — NM MYOCAR MULTI W/SPECT W/WALL MOTION / EF
Estimated workload: 1 METS
Exercise duration (min): 5 min
Exercise duration (sec): 0 s
MPHR: 177 {beats}/min
Peak HR: 114 {beats}/min
Percent HR: 64 %
Rest HR: 74 {beats}/min

## 2016-02-10 LAB — TROPONIN I
Troponin I: <0.03 ng/mL (ref <0.03)
Troponin I: <0.03 ng/mL (ref <0.03)

## 2016-02-10 MED ORDER — TECHNETIUM TC 99M TETROFOSMIN IV KIT
30.0000 | PACK | Freq: Once | INTRAVENOUS | Status: AC | PRN
  Administered 2016-02-10: 30 via INTRAVENOUS

## 2016-02-10 MED ORDER — REGADENOSON 0.4 MG/5ML IV SOLN
0.4000 mg | Freq: Once | INTRAVENOUS | Status: AC
  Administered 2016-02-10: 0.4 mg via INTRAVENOUS
  Filled 2016-02-10: qty 5

## 2016-02-10 MED ORDER — REGADENOSON 0.4 MG/5ML IV SOLN
INTRAVENOUS | Status: AC
  Filled 2016-02-10: qty 5

## 2016-02-10 MED ORDER — TECHNETIUM TC 99M TETROFOSMIN IV KIT
10.0000 | PACK | Freq: Once | INTRAVENOUS | Status: AC | PRN
  Administered 2016-02-10: 10 via INTRAVENOUS

## 2016-02-10 NOTE — Progress Notes (Signed)
  Echocardiogram 2D Echocardiogram has been performed.  Cassie [name] 02/10/2016, 3:41 PM

## 2016-02-10 NOTE — Care Management Note (Signed)
Case Management Note  Patient Details  Name: Cassie [name] MRN: [id number] Date of Birth: [birth year]  Subjective/Objective:    Chest Pain, Syncope                Action/Plan: Discharge Planning: AVS reviewed:  Chart reviewed. No NCM needs identified.  PCP Ria Bush  Expected Discharge Date:  02/10/16               Expected Discharge Plan:  Home/Self Care  In-House Referral:  NA  Discharge planning Services  NA  Post Acute Care Choice:  NA Choice offered to:  NA  DME Arranged:  N/A DME Agency:  NA  HH Arranged:  NA HH Agency:  NA  Status of Service:  Completed, signed off  If discussed at [place] of Stay Meetings, dates discussed:    Additional Comments:  Erenest Rasher, RN 02/10/2016, 5:21 PM

## 2016-02-10 NOTE — Discharge Summary (Signed)
Physician Discharge Summary  Cassie [name] [id number] DOB: 03/28/1970 DOA: 02/09/2016  PCP: Ria Bush, MD  Admit date: 02/09/2016 Discharge date: 02/10/2016  Time spent: 35 minutes  Recommendations for Outpatient Follow-up:  1. PCP in 1 week   Discharge Diagnoses:  Principal Problem:   Chest pain   H/o Syncope  Discharge Condition: stable  Diet recommendation: heart healthy  Filed Weights   02/09/16 1724 02/09/16 2204 02/10/16 0439  Weight: 91.6 kg (202 lb) 90.4 kg (199 lb 4.8 oz) 89.9 kg (198 lb 1.6 oz)    History of present illness:  40 YOF with prior hx of syncope and loop monitor placed 2007. No arrhthymias noted on loop and it has been removed.Per note pt had cath in 2006 that was normal with normal LV function.  Pt presented  To ED due to chest pain, occurred at work, she was reading, not stressed and had Lt scapular pain, gradually increase then to Lt chest pain and under lt breast.  Hospital Course:  1. Atypical Chest pain  -resolved -with some typical features, EKG normal -negative troponin, hx of normal cath in 2006.  -cards consulted, s/p Myoview today which was normal, suspect muscular in origin.  2. Hx of syncope, -negative workup by Dr.Hochrein - abnormal tilt, neg loop recorder  Procedures: Myoview : negative for inducible ischemia  Consultations:  Cardiology  Discharge Exam: Vitals:   02/10/16 1423 02/10/16 1519  BP: 121/65 (!) 102/57  Pulse:  68  Resp: 14 16  Temp:  98.6 F (37 C)    General: AAOx3 Cardiovascular: S1S2/RRR Respiratory: CTAB  Discharge Instructions   Discharge Instructions    Diet - low sodium heart healthy    Complete by:  As directed    Increase activity slowly    Complete by:  As directed      Current Discharge Medication List    CONTINUE these medications which have NOT CHANGED   Details  aspirin 81 MG chewable tablet Chew 243-324 mg by mouth once as needed (FOR CHEST PAIN(S)).     ibuprofen (ADVIL,MOTRIN) 200 MG tablet Take 200-400 mg by mouth every 6 (six) hours as needed for headache or mild pain.    Naproxen Sodium (ALEVE) 220 MG CAPS Take 220-440 mg by mouth 2 (two) times daily as needed (for headaches or pain).       Allergies  Allergen Reactions  . Fish Allergy Anaphylaxis, Hives and Swelling  . Shellfish Allergy Anaphylaxis, Hives and Swelling  . Penicillins Rash and Other (See Comments)    Parents are allergic Has patient had a PCN reaction causing immediate rash, facial/tongue/throat swelling, SOB or lightheadedness with hypotension: Yes Has patient had a PCN reaction causing severe rash involving mucus membranes or skin necrosis: Unk Has patient had a PCN reaction that required hospitalization: Unk Has patient had a PCN reaction occurring within the last 10 years: No If all of the above answers are "NO", then may proceed with Cephalosporin use.No   . Tape Rash      The results of significant diagnostics from this hospitalization (including imaging, microbiology, ancillary and laboratory) are listed below for reference.    Significant Diagnostic Studies: Dg Chest 2 View  Result Date: 02/09/2016 CLINICAL DATA:  LEFT-sided chest pain EXAM: CHEST  2 VIEW COMPARISON:  None. FINDINGS: Normal mediastinum and cardiac silhouette. Normal pulmonary vasculature. No evidence of effusion, infiltrate, or pneumothorax. No acute bony abnormality. IMPRESSION: No acute cardiopulmonary process. Electronically Signed   By: Helane Gunther.D.  On: 02/09/2016 19:44   Nm Myocar Multi W/spect W/wall Motion / Ef  Result Date: 02/10/2016 CLINICAL DATA:  Chest pain at rest. EXAM: MYOCARDIAL IMAGING WITH SPECT (REST AND PHARMACOLOGIC-STRESS) GATED LEFT VENTRICULAR WALL MOTION STUDY LEFT VENTRICULAR EJECTION FRACTION TECHNIQUE: Standard myocardial SPECT imaging was performed after resting intravenous injection of 10 mCi Tc-64m tetrofosmin. Subsequently, intravenous infusion of  Lexiscan was performed under the supervision of the Cardiology staff. At peak effect of the drug, 30 mCi Tc-30m tetrofosmin was injected intravenously and standard myocardial SPECT imaging was performed. Quantitative gated imaging was also performed to evaluate left ventricular wall motion, and estimate left ventricular ejection fraction. COMPARISON:  None. FINDINGS: Perfusion: No decreased activity in the left ventricle on stress imaging to suggest reversible ischemia or infarction. Wall Motion: Normal left ventricular wall motion. No left ventricular dilation. Left Ventricular Ejection Fraction: 78 % End diastolic volume 61 ml End systolic volume 13 ml IMPRESSION: 1. No reversible ischemia or infarction. 2. Normal left ventricular wall motion. 3. Left ventricular ejection fraction 78% 4. Non invasive risk stratification*: Low risk *2012 Appropriate Use Criteria for Coronary Revascularization Focused Update: J Am Coll Cardiol. [id number]. http://content.airportbarriers.com.aspx?articleid=[email or web address] Electronically Signed   By: Marijo Sanes M.D.   On: 02/10/2016 15:56    Microbiology: No results found for this or any previous visit (from the past 240 hour(s)).   Labs: Basic Metabolic Panel:  Recent Labs Lab 02/09/16 1758 02/09/16 2219  NA 139  --   K 4.4  --   CL 106  --   CO2 25  --   GLUCOSE 84  --   BUN 14  --   CREATININE 1.03* 0.98  CALCIUM 9.4  --   MG 2.4  --    Liver Function Tests: No results for input(s): AST, ALT, ALKPHOS, BILITOT, PROT, ALBUMIN in the last 168 hours. No results for input(s): LIPASE, AMYLASE in the last 168 hours. No results for input(s): AMMONIA in the last 168 hours. CBC:  Recent Labs Lab 02/09/16 1758 02/09/16 2219  WBC 6.1 5.5  HGB 13.6 13.1  HCT 39.7 38.8  MCV 94.3 94.9  PLT 215 209   Cardiac Enzymes:  Recent Labs Lab 02/09/16 2219 02/10/16 0405 02/10/16 0950  TROPONINI <0.03 <0.03 <0.03   BNP: BNP (last 3 results) No results  for input(s): BNP in the last 8760 hours.  ProBNP (last 3 results) No results for input(s): PROBNP in the last 8760 hours.  CBG: No results for input(s): GLUCAP in the last 168 hours.     SignedDomenic Polite MD.  [facility] 02/10/2016, 4:17 PM

## 2016-02-10 NOTE — Progress Notes (Signed)
PROGRESS NOTE    Cassie [name]  [id number] DOB: 1970/01/07 DOA: 02/09/2016 PCP: Cassie Bush, MD  Brief Narrative:45 YOF with prior hx of syncope and loop monitor placed 2007. No arrhthymias noted on loop and it has been removed.  Per note pt had cath in 2006 that was normal with normal LV function.      Pt presented  To ED due to chest pain, occurred at work, she was reading, not stressed and had Lt scapular pain, gradually increase then to Lt chest pain and under lt breast.  Assessment & Plan: 1.  Atypical Chest pain  -with some typical features, EKG normal -negative troponin, hx of normal cath in 2006.   -cards consulting, plan for Myoview today  2.  Hx of syncope, -negative workup by Dr.Hochrein - abnormal tilt, neg loop recorder  DVT prophylaxis: Lovenox. Code Status: Full code.  Family Communication:none at bedside Dispo: home epnding workup  Consultants:   Cards   Subjective: Feels ok, no further CP Objective: Vitals:   02/09/16 2130 02/09/16 2204 02/10/16 0439 02/10/16 0739  BP: 124/75 130/80 105/65 (!) 124/91  Pulse: 73 73 (!) 59   Resp: 24     Temp:  98.3 F (36.8 C) 98.3 F (36.8 C)   TempSrc:  Oral Other (Comment)   SpO2: 94% 100% 98%   Weight:  90.4 kg (199 lb 4.8 oz) 89.9 kg (198 lb 1.6 oz)   Height:  5\' 4"  (1.626 m)      Intake/Output Summary (Last 24 hours) at 02/10/16 1143 Last data filed at 02/09/16 2300  Gross per 24 hour  Intake              125 ml  Output                0 ml  Net              125 ml   Filed Weights   02/09/16 1724 02/09/16 2204 02/10/16 0439  Weight: 91.6 kg (202 lb) 90.4 kg (199 lb 4.8 oz) 89.9 kg (198 lb 1.6 oz)    Examination:  General exam: Appears calm and comfortable  Respiratory system: Clear to auscultation. Respiratory effort normal. Cardiovascular system: S1 & S2 heard, RRR. No JVD, murmurs, rubs, gallops or clicks. No pedal edema. Gastrointestinal system: Abdomen is nondistended, soft and  nontender. No organomegaly or masses felt. Normal bowel sounds heard. Central nervous system: Alert and oriented. No focal neurological deficits. Extremities: Symmetric 5 x 5 power. Skin: No rashes, lesions or ulcers Psychiatry: Judgement and insight appear normal. Mood & affect appropriate.     Data Reviewed: I have personally reviewed following labs and imaging studies  CBC:  Recent Labs Lab 02/09/16 1758 02/09/16 2219  WBC 6.1 5.5  HGB 13.6 13.1  HCT 39.7 38.8  MCV 94.3 94.9  PLT 215 [id number]   Basic Metabolic Panel:  Recent Labs Lab 02/09/16 1758 02/09/16 2219  NA 139  --   K 4.4  --   CL 106  --   CO2 25  --   GLUCOSE 84  --   BUN 14  --   CREATININE 1.03* 0.98  CALCIUM 9.4  --   MG 2.4  --    GFR: Estimated Creatinine Clearance: 80.4 mL/min (by C-G formula based on SCr of 0.98 mg/dL). Liver Function Tests: No results for input(s): AST, ALT, ALKPHOS, BILITOT, PROT, ALBUMIN in the last 168 hours. No results for input(s): LIPASE, AMYLASE in the  last 168 hours. No results for input(s): AMMONIA in the last 168 hours. Coagulation Profile: No results for input(s): INR, PROTIME in the last 168 hours. Cardiac Enzymes:  Recent Labs Lab 02/09/16 2219 02/10/16 0405 02/10/16 0950  TROPONINI <0.03 <0.03 <0.03   BNP (last 3 results) No results for input(s): PROBNP in the last 8760 hours. HbA1C: No results for input(s): HGBA1C in the last 72 hours. CBG: No results for input(s): GLUCAP in the last 168 hours. Lipid Profile: No results for input(s): CHOL, HDL, LDLCALC, TRIG, CHOLHDL, LDLDIRECT in the last 72 hours. Thyroid Function Tests: No results for input(s): TSH, T4TOTAL, FREET4, T3FREE, THYROIDAB in the last 72 hours. Anemia Panel: No results for input(s): VITAMINB12, FOLATE, FERRITIN, TIBC, IRON, RETICCTPCT in the last 72 hours. Urine analysis: No results found for: COLORURINE, APPEARANCEUR, LABSPEC, PHURINE, GLUCOSEU, HGBUR, BILIRUBINUR, KETONESUR, PROTEINUR,  UROBILINOGEN, NITRITE, LEUKOCYTESUR Sepsis Labs: @LABRCNTIP (procalcitonin:4,lacticidven:4)  )No results found for this or any previous visit (from the past 240 hour(s)).       Radiology Studies: Dg Chest 2 View  Result Date: 02/09/2016 CLINICAL DATA:  LEFT-sided chest pain EXAM: CHEST  2 VIEW COMPARISON:  None. FINDINGS: Normal mediastinum and cardiac silhouette. Normal pulmonary vasculature. No evidence of effusion, infiltrate, or pneumothorax. No acute bony abnormality. IMPRESSION: No acute cardiopulmonary process. Electronically Signed   By: Cassie [name] M.D.   On: 02/09/2016 19:44        Scheduled Meds: . aspirin EC  325 mg Oral Daily  . enoxaparin (LOVENOX) injection  40 mg Subcutaneous QHS   Continuous Infusions:   LOS: 0 days    Time spent: 42min    Cassie Polite, MD [facility] Pager [phone]  If 7PM-7AM, please contact night-coverage [email or web address] Password [email or web address] 02/10/2016, 11:43 AM

## 2016-02-10 NOTE — Consult Note (Signed)
Reason for Consult: chest pain    Referring Physician: Dr. Hal Hope   PCP:  Ria Bush, MD  Primary Cardiologist: Dr. Vita Barley    Cassie [name] is an 46 y.o. female.    Chief Complaint: was sent from PCP office with chest pain that began yesterday around noon.    HPI:  [name] with prior hx of syncope and loop monitor placed 2007. No arrhthymias noted on loop and it has been removed.  Per note pt had cath in 2006 that was normal with normal LV function.       Pt presented yesterday from PCP office due to chest pain.  Pain occurred at work, she was reading, not stressed and had Lt scapular pain, gradually increase then to Lt chest pain and under lt breast.  She walked around without problems and ate with no relief.  Her heart was racing up to 135, and her BP was elevated for her at 132/84.  She became SOB and went to her MD, NTG with improvement of symptoms but they would return.  She did have some nausea as well.   She said pain would improve then return.  pk BP in ER 130/94.   + FH of CAD -premature  On father's side.  EKG SR with low voltage precordial leads  Troponin POC negative,  troponi  < 0.03 X 2.  CXR without acute process  DDimer < 0.27, lytes and CBC WNL.   Currently comfortable in bed.  Has been NPO.  She has had hysterectomy but ovaries intact.  Cholesterol has been normal.   Past Medical History:  Diagnosis Date  . Allergy   . Asthma   . Cardiac disease   . Diffuse cystic mastopathy   . GERD (gastroesophageal reflux disease)   . Guttate psoriasis   . H/O syncope    cards work-up WNL (Hochrein) thought 2/2 overmedication vs arrhythmias  . Hx of endometriosis    hysterectomy  . Hx of migraines    occasional; improved since hysterectomy  . Labral tear of shoulder 02/2014   R, pending surgery Tamera Punt)  . Rosacea    mild    Past Surgical History:  Procedure Laterality Date  . ABDOMINAL HYSTERECTOMY    . BREAST BIOPSY  2010   left; core biopsy  . BREAST EXCISIONAL BIOPSY Bilateral    multiple bilateral  . BREAST MASS EXCISION  2008  . BREAST SURGERY Right    excision of lesion  . CARDIAC CATHETERIZATION  2007  . CARDIAC CATHETERIZATION    . CHOLECYSTECTOMY  2008  . COLONOSCOPY  2006   WNL Nicolasa Ducking)  . COLONOSCOPY  06/2013   mod diverticulosis, rpt 10 yrs Henrene Pastor)  . LAPAROSCOPIC HYSTERECTOMY  2002   Dr. Kyra Manges OBGYN  . LAPAROSCOPY  1994   endometriosis  . TONSILLECTOMY  1994  . TONSILLECTOMY AND ADENOIDECTOMY  1995    Family History  Problem Relation Age of Onset  . Skin cancer Mother     melanoma  . Hypertension Mother   . Irritable bowel syndrome Mother   . Breast cancer Mother 72  . Coronary artery disease Father     age 19's  . Breast cancer Maternal Grandmother 95  . Stroke Maternal Grandmother   . Other Son     hyperinsulinemic  . Coronary artery disease Paternal Uncle     age 89's  . Skin cancer Sister 49    melanoma - cancer  in eye and uterus  . Thyroid disease Sister   . Breast cancer Paternal Aunt   . Breast cancer      pat. great aunt  . Irritable bowel syndrome Daughter   . Diabetes Maternal Grandfather   . Diabetes Paternal Uncle   . Epilepsy Son   . Breast cancer Cousin   . Pulmonary embolism Other     died from this after MVA  . Colon cancer Neg Hx    Social History:  reports that she quit smoking about 8 years ago. She has never used smokeless tobacco. She reports that she drinks alcohol. She reports that she does not use drugs.  Allergies:  Allergies  Allergen Reactions  . Fish Allergy Anaphylaxis, Hives and Swelling  . Shellfish Allergy Anaphylaxis, Hives and Swelling  . Penicillins Rash and Other (See Comments)    Parents are allergic Has patient had a PCN reaction causing immediate rash, facial/tongue/throat swelling, SOB or lightheadedness with hypotension: Yes Has patient had a PCN reaction causing severe rash involving mucus membranes or skin  necrosis: Unk Has patient had a PCN reaction that required hospitalization: Unk Has patient had a PCN reaction occurring within the last 10 years: No If all of the above answers are "NO", then may proceed with Cephalosporin use.No   . Tape Rash    OUTPATIENT MEDICATIONS: ASA 81 mg daily advil  Aleve  CURRENT MEDICATIONS: Scheduled Meds: . aspirin EC  325 mg Oral Daily  . enoxaparin (LOVENOX) injection  40 mg Subcutaneous QHS   Continuous Infusions: PRN Meds:.acetaminophen, nitroGLYCERIN, ondansetron (ZOFRAN) IV   Results for orders placed or performed during the hospital encounter of 02/09/16 (from the past 48 hour(s))  CBC     Status: None   Collection Time: 02/09/16  5:58 PM  Result Value Ref Range   WBC 6.1 4.0 - 10.5 K/uL   RBC 4.21 3.87 - 5.11 MIL/uL   Hemoglobin 13.6 12.0 - 15.0 g/dL   HCT 39.7 36.0 - 46.0 %   MCV 94.3 78.0 - 100.0 fL   MCH 32.3 26.0 - 34.0 pg   MCHC 34.3 30.0 - 36.0 g/dL   RDW 12.9 11.5 - 15.5 %   Platelets 215 150 - 400 K/uL  Basic metabolic panel     Status: Abnormal   Collection Time: 02/09/16  5:58 PM  Result Value Ref Range   Sodium 139 135 - 145 mmol/L   Potassium 4.4 3.5 - 5.1 mmol/L   Chloride 106 101 - 111 mmol/L   CO2 25 22 - 32 mmol/L   Glucose, Bld 84 65 - 99 mg/dL   BUN 14 6 - 20 mg/dL   Creatinine, Ser 1.03 (H) 0.44 - 1.00 mg/dL   Calcium 9.4 8.9 - 10.3 mg/dL   GFR calc non Af Amer >60 >60 mL/min   GFR calc Af Amer >60 >60 mL/min    Comment: (NOTE) The eGFR has been calculated using the CKD EPI equation. This calculation has not been validated in all clinical situations. eGFR's persistently <60 mL/min signify possible Chronic Kidney Disease.    Anion gap 8 5 - 15  Magnesium     Status: None   Collection Time: 02/09/16  5:58 PM  Result Value Ref Range   Magnesium 2.4 1.7 - 2.4 mg/dL  D-dimer, quantitative (not at [facility])     Status: None   Collection Time: 02/09/16  5:58 PM  Result Value Ref Range   D-Dimer, Quant <0.27  0.00 - 0.50 ug/mL-FEU  Comment: (NOTE) At the manufacturer cut-off of 0.50 ug/mL FEU, this assay has been documented to exclude PE with a sensitivity and negative predictive value of 97 to 99%.  At this time, this assay has not been approved by the FDA to exclude DVT/VTE. Results should be correlated with clinical presentation.   I-Stat Troponin, ED (not at [facility])     Status: None   Collection Time: 02/09/16  6:21 PM  Result Value Ref Range   Troponin i, poc 0.00 0.00 - 0.08 ng/mL   Comment 3            Comment: Due to the release kinetics of cTnI, a negative result within the first hours of the onset of symptoms does not rule out myocardial infarction with certainty. If myocardial infarction is still suspected, repeat the test at appropriate intervals.   Troponin I (q 6hr x 3)     Status: None   Collection Time: 02/09/16 10:19 PM  Result Value Ref Range   Troponin I <0.03 <0.03 ng/mL  CBC     Status: None   Collection Time: 02/09/16 10:19 PM  Result Value Ref Range   WBC 5.5 4.0 - 10.5 K/uL   RBC 4.09 3.87 - 5.11 MIL/uL   Hemoglobin 13.1 12.0 - 15.0 g/dL   HCT 38.8 36.0 - 46.0 %   MCV 94.9 78.0 - 100.0 fL   MCH 32.0 26.0 - 34.0 pg   MCHC 33.8 30.0 - 36.0 g/dL   RDW 13.0 11.5 - 15.5 %   Platelets 209 150 - 400 K/uL  Creatinine, serum     Status: None   Collection Time: 02/09/16 10:19 PM  Result Value Ref Range   Creatinine, Ser 0.98 0.44 - 1.00 mg/dL   GFR calc non Af Amer >60 >60 mL/min   GFR calc Af Amer >60 >60 mL/min    Comment: (NOTE) The eGFR has been calculated using the CKD EPI equation. This calculation has not been validated in all clinical situations. eGFR's persistently <60 mL/min signify possible Chronic Kidney Disease.   Troponin I (q 6hr x 3)     Status: None   Collection Time: 02/10/16  4:05 AM  Result Value Ref Range   Troponin I <0.03 <0.03 ng/mL   Dg Chest 2 View  Result Date: 02/09/2016 CLINICAL DATA:  LEFT-sided chest pain EXAM: CHEST  2 VIEW  COMPARISON:  None. FINDINGS: Normal mediastinum and cardiac silhouette. Normal pulmonary vasculature. No evidence of effusion, infiltrate, or pneumothorax. No acute bony abnormality. IMPRESSION: No acute cardiopulmonary process. Electronically Signed   By: Suzy Bouchard M.D.   On: 02/09/2016 19:44    ROS: General:no colds or fevers, some weight loss, recent flu and bronchitis in early jan.  Skin:no rashes or ulcers HEENT:no blurred vision, no congestion CV:see HPI PUL:see HPI GI:no diarrhea constipation or melena, no indigestion GU:no hematuria, no dysuria MS:no joint pain, no claudication Neuro:no syncope, no lightheadedness Endo:no diabetes, no thyroid disease   Blood pressure (!) 124/91, pulse (!) 59, temperature 98.3 F (36.8 C), temperature source Other (Comment), resp. rate 24, height 5' 4"  (1.626 m), weight 198 lb 1.6 oz (89.9 kg), last menstrual period 01/27/2000, SpO2 98 %.  Wt Readings from Last 3 Encounters:  02/10/16 198 lb 1.6 oz (89.9 kg)  02/09/16 202 lb 4 oz (91.7 kg)  01/20/16 204 lb (92.5 kg)    PE: General:Pleasant affect, NAD Skin:Warm and dry, brisk capillary refill HEENT:normocephalic, sclera clear, mucus membranes moist Neck:supple, no JVD, no bruits  Heart:S1S2 RRR without murmur, gallup, rub or click Lungs:clear without rales, rhonchi, or wheezes RAJ:HHID, non tender, + BS, do not palpate liver spleen or masses Ext:no lower ext edema, 2+ pedal pulses, 2+ radial pulses Neuro:alert and oriented X 3, MAE, follows commands, + facial symmetry   Assessment/Plan 1.  Chest pain negative troponin, hx of normal cath in 2006.  This associated with SOB and nausea. Possible myoview Dr. Acie Fredrickson to see.   2.  Hx of syncope, abnormal tilt, neg loop recorder, no episodes in 8 years.  3. + FH premature CAD.   4.  Hx of GERD   Cecilie Kicks  Nurse Practitioner Certified [place] Pager [phone] or after 5pm or weekends call  [phone] 02/10/2016, 9:31 AM   Attending Note:   The patient was seen and examined.  Agree with assessment and plan as noted above.  Changes made to the above note as needed.  Patient seen and independently examined with Cecilie Kicks, NP .   We discussed all aspects of the encounter. I agree with the assessment and plan as stated above.  1. Chest pain :   Some worrisome features.   troponins are negative.  ecg is non acute. + family hx of premature CAD  CP have resolved for now Will schedule her for a Lexiscan myoview  DC today if myoview is negative.      I have spent a total of 40 minutes with patient reviewing hospital  notes , telemetry, EKGs, labs and examining patient as well as establishing an assessment and plan that was discussed with the patient. > 50% of time was spent in direct patient care.    Thayer Headings, Brooke Bonito., MD, [facility] 02/10/2016, 10:28 AM [address]  [place] Pager [phone]

## 2016-02-13 ENCOUNTER — Telehealth: Payer: Self-pay | Admitting: Family Medicine

## 2016-02-13 DIAGNOSIS — R079 Chest pain, unspecified: Secondary | ICD-10-CM

## 2016-02-13 NOTE — Telephone Encounter (Signed)
Appt made with Dr Percival Spanish on 02/15/16 at 9:30am.

## 2016-02-13 NOTE — Telephone Encounter (Signed)
Sure - nonurgent referral placed to Dr Percival Spanish.

## 2016-02-13 NOTE — Telephone Encounter (Signed)
Pt needs cardio referral dure to being in the hospital over weekend.  She has Dr. Evie Lacks several years ago.   Pt has hospital follow up tomorrow but wanted a head start on referral.  cb number at work is [phone] Thanks

## 2016-02-14 ENCOUNTER — Encounter: Payer: Self-pay | Admitting: Family Medicine

## 2016-02-14 ENCOUNTER — Ambulatory Visit (INDEPENDENT_AMBULATORY_CARE_PROVIDER_SITE_OTHER): Payer: BLUE CROSS/BLUE SHIELD | Admitting: Family Medicine

## 2016-02-14 VITALS — BP 112/64 | HR 72 | Temp 98.4°F | Wt 201.2 lb

## 2016-02-14 DIAGNOSIS — R002 Palpitations: Secondary | ICD-10-CM | POA: Diagnosis not present

## 2016-02-14 DIAGNOSIS — R079 Chest pain, unspecified: Secondary | ICD-10-CM

## 2016-02-14 LAB — TSH: TSH: 1.09 u[IU]/mL (ref 0.35–4.50)

## 2016-02-14 LAB — T4, FREE: Free T4: 0.85 ng/dL (ref 0.60–1.60)

## 2016-02-14 NOTE — Progress Notes (Signed)
Pre visit review using our clinic review tool, if applicable. No additional management support is needed unless otherwise documented below in the visit note. 

## 2016-02-14 NOTE — Progress Notes (Signed)
BP 112/64   Pulse 72   Temp 98.4 F (36.9 C) (Oral)   Wt 201 lb 4 oz (91.3 kg)   LMP 01/27/2000   BMI 34.54 kg/m    CC: hosp f/u visit Subjective:    Patient ID: Cassie [name], female    DOB: 09-18-1970, 46 y.o.   MRN: [id number]  HPI: Cassie [name] is a 46 y.o. female presenting on 02/14/2016 for Follow-up (hospital)   See prior note and recent hospitalization for details.  Seen here 02/09/2016 with new onset L chest pain/pressure and scapular pain. Given some concerning features and fmhx premature CAD, sent to ER where she was admitted. Workup included normal EKG, negative cardiac enzymes, normal CXR, normal myoview and normal echocardiogram. Normal D dimer.  Pt states she did have racing heart to 130s in ER even at rest. Endorses rare feelings of palpitations at home. No further chest pain since home. Staying fatigued. No dizziness, headache, dyspnea. Wonders about thyroid function - sister with thyroid disease. No GERD sxs.   Has appt tomorrow with Dr Percival Spanish - worried about return of palpitations.  Admit date: 02/09/2016 Discharge date: 02/10/2016 F/u phone call not performed [facility])  Recommendations for Outpatient Follow-up:  1. PCP in 1 week  Discharge Diagnoses:  Principal Problem:   Chest pain   H/o Syncope  Discharge Condition: stable Diet recommendation: heart healthy  Relevant past medical, surgical, family and social history reviewed and updated as indicated. Interim medical history since our last visit reviewed. Allergies and medications reviewed and updated. No current outpatient prescriptions on file prior to visit.   No current facility-administered medications on file prior to visit.     Review of Systems Per HPI unless specifically indicated in ROS section     Objective:    BP 112/64   Pulse 72   Temp 98.4 F (36.9 C) (Oral)   Wt 201 lb 4 oz (91.3 kg)   LMP 01/27/2000   BMI 34.54 kg/m   Wt Readings from Last 3 Encounters:  02/14/16  201 lb 4 oz (91.3 kg)  02/10/16 198 lb 1.6 oz (89.9 kg)  02/09/16 202 lb 4 oz (91.7 kg)    Physical Exam  Constitutional: She appears well-developed and well-nourished. No distress.  HENT:  Mouth/Throat: Oropharynx is clear and moist. No oropharyngeal exudate.  Cardiovascular: Normal rate, regular rhythm, normal heart sounds and intact distal pulses.   No murmur heard. Pulmonary/Chest: Effort normal and breath sounds normal. No respiratory distress. She has no wheezes. She has no rales. She exhibits no tenderness.  No reproducible tenderness to palpation of chest wall, L lower ribcage, thoracic or cervical spine, or L scapular region  Musculoskeletal: She exhibits no edema.  Skin: Skin is warm and dry. No rash noted.  Psychiatric: She has a normal mood and affect.  Nursing note and vitals reviewed.  Results for orders placed or performed during the hospital encounter of 02/09/16  NM Myocar Multi W/Spect W/Wall Motion / EF  Result Value Ref Range   Rest HR 74 bpm   Rest BP 118/74 mmHg   Exercise duration (sec) 0 sec   Percent HR 64 %   Exercise duration (min) 5 min   Estimated workload 1.0 METS   Peak HR 114 bpm   Peak BP 128/60 mmHg   MPHR 177 bpm  CBC  Result Value Ref Range   WBC 6.1 4.0 - 10.5 K/uL   RBC 4.21 3.87 - 5.11 MIL/uL   Hemoglobin 13.6  12.0 - 15.0 g/dL   HCT 39.7 36.0 - 46.0 %   MCV 94.3 78.0 - 100.0 fL   MCH 32.3 26.0 - 34.0 pg   MCHC 34.3 30.0 - 36.0 g/dL   RDW 12.9 11.5 - 15.5 %   Platelets 215 150 - 400 K/uL  Basic metabolic panel  Result Value Ref Range   Sodium 139 135 - 145 mmol/L   Potassium 4.4 3.5 - 5.1 mmol/L   Chloride 106 101 - 111 mmol/L   CO2 25 22 - 32 mmol/L   Glucose, Bld 84 65 - 99 mg/dL   BUN 14 6 - 20 mg/dL   Creatinine, Ser 1.03 (H) 0.44 - 1.00 mg/dL   Calcium 9.4 8.9 - 10.3 mg/dL   GFR calc non Af Amer >60 >60 mL/min   GFR calc Af Amer >60 >60 mL/min   Anion gap 8 5 - 15  Magnesium  Result Value Ref Range   Magnesium 2.4 1.7 -  2.4 mg/dL  D-dimer, quantitative (not at [facility])  Result Value Ref Range   D-Dimer, Quant <0.27 0.00 - 0.50 ug/mL-FEU  Troponin I (q 6hr x 3)  Result Value Ref Range   Troponin I <0.03 <0.03 ng/mL  Troponin I (q 6hr x 3)  Result Value Ref Range   Troponin I <0.03 <0.03 ng/mL  Troponin I (q 6hr x 3)  Result Value Ref Range   Troponin I <0.03 <0.03 ng/mL  CBC  Result Value Ref Range   WBC 5.5 4.0 - 10.5 K/uL   RBC 4.09 3.87 - 5.11 MIL/uL   Hemoglobin 13.1 12.0 - 15.0 g/dL   HCT 38.8 36.0 - 46.0 %   MCV 94.9 78.0 - 100.0 fL   MCH 32.0 26.0 - 34.0 pg   MCHC 33.8 30.0 - 36.0 g/dL   RDW 13.0 11.5 - 15.5 %   Platelets 209 150 - 400 K/uL  Creatinine, serum  Result Value Ref Range   Creatinine, Ser 0.98 0.44 - 1.00 mg/dL   GFR calc non Af Amer >60 >60 mL/min   GFR calc Af Amer >60 >60 mL/min  I-Stat Troponin, ED (not at [facility])  Result Value Ref Range   Troponin i, poc 0.00 0.00 - 0.08 ng/mL   Comment 3          Echocardiogram  Result Value Ref Range   Weight 3,169.6 oz   Height 64 in   BP 121/65 mmHg   Lab Results  Component Value Date   TSH 0.46 04/28/2013      Assessment & Plan:   Problem List Items Addressed This Visit    Chest pain at rest - Primary    Reassuring hospital work up reviewed with patient.  Not consistent with GERD, or really with MSK pain.  Regardless, pt has not had recurrence of chest pain.  Reasonable to check TSH given fmhx.  Pt will notify us if any recurrence of symptoms.  Discussed monitoring pulse and notifying us if >120.       Relevant Orders   TSH   T4, free    Other Visit Diagnoses    Palpitations       Relevant Orders   TSH   T4, free       Follow up plan: Return if symptoms worsen or fail to improve.  Ria Bush, MD

## 2016-02-14 NOTE — Patient Instructions (Addendum)
I'm glad you're doing better.  Hospital work up was very reassuring.  Let's check thyroid function today - labs. Will see what cardiology thinks.

## 2016-02-14 NOTE — Progress Notes (Signed)
Cardiology Office Note   Date:  02/16/2016   ID:  Cassie [name], Cassie [name] 02/15/1970, MRN [id number]  PCP:  Cassie Bush, MD  Cardiologist:   Cassie Breeding, MD  Referring:  Cassie Bush, MD  Chief Complaint  Patient presents with  . Chest Pain      History of Present Illness: Cassie [name] is a 46 y.o. female who presents for hospital follow up for treatment of chest pain.     The patient has a past history of syncope in 2007. She had a loop monitor placed with no arrhythmias noted. It has been removed. She had normal coronaries on cardiac catheterization in 2006. She was just in the hospital couple of days ago chest pain. She was seen in consultation.  Stress perfusion study was negative for any evidence of ischemia.  She had a normal echo. I reviewed these hospital records.  She reports that she did have discomfort 1 day at work.  She was not under stress.  This was a pain in her left shoulder blade. It was indigestion. At lunch she started to have heart racing. It got up to 135.  She went to her PCP and got NTG with some improvement. However, her symptoms came back.  She went to the ED for the evaluation as above.  Since I last saw her she has done well.  The patient denies any new symptoms such as chest discomfort, neck or arm discomfort. There has been no new shortness of breath, PND or orthopnea. There have been no reported palpitations, presyncope or syncope.  She is somewhat active.      Past Medical History:  Diagnosis Date  . Allergy   . Asthma   . Cardiac disease   . Diffuse cystic mastopathy   . GERD (gastroesophageal reflux disease)   . Guttate psoriasis   . H/O syncope    cards work-up WNL (Cassie [name]) thought 2/2 overmedication vs arrhythmias  . Hx of endometriosis    hysterectomy  . Hx of migraines    occasional; improved since hysterectomy  . Labral tear of shoulder 02/2014   R, pending surgery Cassie [name])  . Rosacea    mild    Past Surgical  History:  Procedure Laterality Date  . ABDOMINAL HYSTERECTOMY    . BREAST BIOPSY  2010   left; core biopsy  . BREAST EXCISIONAL BIOPSY Bilateral    multiple bilateral  . BREAST MASS EXCISION  2008  . BREAST SURGERY Right    excision of lesion  . CARDIAC CATHETERIZATION  2007  . CARDIAC CATHETERIZATION    . CHOLECYSTECTOMY  2008  . COLONOSCOPY  2006   WNL Cassie [name])  . COLONOSCOPY  06/2013   mod diverticulosis, rpt 10 yrs Cassie [name])  . LAPAROSCOPIC HYSTERECTOMY  2002   Dr. Kyra [name] OBGYN  . LAPAROSCOPY  1994   endometriosis  . TONSILLECTOMY  1994  . TONSILLECTOMY AND ADENOIDECTOMY  1995     No current outpatient prescriptions on file.   No current facility-administered medications for this visit.     Allergies:   Fish allergy; Shellfish allergy; Penicillins; and Tape    ROS:  Please see the history of present illness.   Otherwise, review of systems are positive for none.   All other systems are reviewed and negative.    PHYSICAL EXAM: VS:  BP 116/72   Pulse 89   Ht 5\' 5"  (1.651 m)   Wt 200 lb (90.7 kg)   LMP 01/27/2000  BMI 33.28 kg/m  , BMI Body mass index is 33.28 kg/m. GENERAL:  Well appearing NECK:  No jugular venous distention, waveform within normal limits, carotid upstroke brisk and symmetric, no bruits, no thyromegaly LUNGS:  Clear to auscultation bilaterally BACK:  No CVA tenderness CHEST:  Unremarkable HEART:  PMI not displaced or sustained,S1 and S2 within normal limits, no S3, no S4, no clicks, no rubs, no murmurs ABD:  Flat, positive bowel sounds normal in frequency in pitch, no bruits, no rebound, no guarding, no midline pulsatile mass, no hepatomegaly, no splenomegaly EXT:  2 plus pulses throughout, no edema, no cyanosis no clubbing   EKG:  EKG is not ordered today.   Recent Labs: 02/09/2016: BUN 14; Creatinine, Ser 0.98; Hemoglobin 13.1; Magnesium 2.4; Platelets 209; Potassium 4.4; Sodium 139 02/14/2016: TSH 1.09    Lipid Panel   Wt  Readings from Last 3 Encounters:  02/15/16 200 lb (90.7 kg)  02/14/16 201 lb 4 oz (91.3 kg)  02/10/16 198 lb 1.6 oz (89.9 kg)      Other studies Reviewed: Additional studies/ records that were reviewed today include:   Hospital records and office/hospital EKGs. Review of the above records demonstrates:  Please see elsewhere in the note.     ASSESSMENT AND PLAN:  CHEST PAIN:   The post test probability of obstructive coronary disease is very low. I would suspect perhaps a GI etiology. However, with her risk factors I will order a coronary calcium score.  This will guide medical management for risk reduction such as lipids.  PALPITATIONS:     I suspect this was driven by the pain and anxiety. No further evaluation is indicated.   DYSLIPIDEMIA:  She needs a lipid profile fasting.    Current medicines are reviewed at length with the patient today.  The patient does not have concerns regarding medicines.  The following changes have been made:  no change  Labs/ tests ordered today include:Telephone    Orders Placed This Encounter  Procedures  . CT CARDIAC SCORING  . Lipid panel     Disposition:   FU with me as needed.     Signed, Cassie Breeding, MD  02/16/2016 5:06 PM    [facility]

## 2016-02-14 NOTE — Assessment & Plan Note (Addendum)
Reassuring hospital work up reviewed with patient.  Not consistent with GERD, or really with MSK pain.  Regardless, pt has not had recurrence of chest pain.  Reasonable to check TSH given fmhx.  Pt will notify us if any recurrence of symptoms.  Discussed monitoring pulse and notifying us if >120.

## 2016-02-15 ENCOUNTER — Encounter: Payer: Self-pay | Admitting: *Deleted

## 2016-02-15 ENCOUNTER — Ambulatory Visit (INDEPENDENT_AMBULATORY_CARE_PROVIDER_SITE_OTHER): Payer: BLUE CROSS/BLUE SHIELD | Admitting: Cardiology

## 2016-02-15 VITALS — BP 116/72 | HR 89 | Ht 65.0 in | Wt 200.0 lb

## 2016-02-15 DIAGNOSIS — R079 Chest pain, unspecified: Secondary | ICD-10-CM | POA: Diagnosis not present

## 2016-02-15 DIAGNOSIS — R002 Palpitations: Secondary | ICD-10-CM | POA: Diagnosis not present

## 2016-02-15 NOTE — Patient Instructions (Addendum)
Medication Instructions:  Continue current medications  Labwork: Fasting Lipids  Testing/Procedures: Your physician has requested that you have a Coronary Calcium Score Test. This test is done at our [facility].  Follow-Up: Your physician recommends that you schedule a follow-up appointment in: As Needed   Any Other Special Instructions Will Be Listed Below (If Applicable).   If you need a refill on your cardiac medications before your next appointment, please call your pharmacy.

## 2016-02-16 ENCOUNTER — Encounter: Payer: Self-pay | Admitting: Cardiology

## 2016-02-16 DIAGNOSIS — R079 Chest pain, unspecified: Secondary | ICD-10-CM | POA: Diagnosis not present

## 2016-02-17 LAB — LIPID PANEL
Chol/HDL Ratio: 3.2 ratio units (ref 0.0–4.4)
Cholesterol, Total: 158 mg/dL (ref 100–199)
HDL: 50 mg/dL (ref >39)
LDL Calculated: 82 mg/dL (ref 0–99)
Triglycerides: 128 mg/dL (ref 0–149)
VLDL Cholesterol Cal: 26 mg/dL (ref 5–40)

## 2016-02-20 ENCOUNTER — Ambulatory Visit (INDEPENDENT_AMBULATORY_CARE_PROVIDER_SITE_OTHER)
Admission: RE | Admit: 2016-02-20 | Discharge: 2016-02-20 | Disposition: A | Discharge status: Home / Self Care | Payer: Self-pay | Source: Ambulatory Visit | Attending: Cardiology | Admitting: Cardiology

## 2016-02-20 DIAGNOSIS — R079 Chest pain, unspecified: Secondary | ICD-10-CM

## 2016-02-24 ENCOUNTER — Telehealth: Payer: Self-pay | Admitting: Cardiology

## 2016-02-24 NOTE — Telephone Encounter (Signed)
Returned call-made aware of results:  Notes Recorded by Minus Breeding, MD on 02/22/2016 at 9:42 AM EST Zero coronary calcium score. No further work up call with results.  Pt verbalized understanding.

## 2016-02-24 NOTE — Telephone Encounter (Signed)
New message ° ° ° ° °Returning a call to the nurse °

## 2016-03-27 ENCOUNTER — Encounter: Payer: Self-pay | Admitting: Certified Nurse Midwife

## 2016-03-27 ENCOUNTER — Ambulatory Visit (INDEPENDENT_AMBULATORY_CARE_PROVIDER_SITE_OTHER): Payer: BLUE CROSS/BLUE SHIELD | Admitting: Certified Nurse Midwife

## 2016-03-27 VITALS — BP 110/70 | HR 83 | Ht 65.0 in | Wt 202.0 lb

## 2016-03-27 DIAGNOSIS — Z01419 Encounter for gynecological examination (general) (routine) without abnormal findings: Secondary | ICD-10-CM

## 2016-03-27 DIAGNOSIS — R5382 Chronic fatigue, unspecified: Secondary | ICD-10-CM | POA: Diagnosis not present

## 2016-03-28 ENCOUNTER — Other Ambulatory Visit: Payer: BLUE CROSS/BLUE SHIELD

## 2016-03-28 DIAGNOSIS — Z01419 Encounter for gynecological examination (general) (routine) without abnormal findings: Secondary | ICD-10-CM | POA: Diagnosis not present

## 2016-03-28 DIAGNOSIS — R5382 Chronic fatigue, unspecified: Secondary | ICD-10-CM | POA: Diagnosis not present

## 2016-03-29 LAB — VITAMIN D 25 HYDROXY (VIT D DEFICIENCY, FRACTURES): Vit D, 25-Hydroxy: 21.1 ng/mL — ABNORMAL LOW (ref 30.0–100.0)

## 2016-03-29 LAB — VITAMIN B12: Vitamin B-12: 434 pg/mL (ref 232–1245)

## 2016-03-30 LAB — PAP IG (IMAGE GUIDED): PAP Smear Comment: 0

## 2016-04-03 ENCOUNTER — Encounter: Payer: Self-pay | Admitting: Certified Nurse Midwife

## 2016-04-03 NOTE — Progress Notes (Signed)
Gynecology Annual Exam  PCP: Ria Bush, MD  Chief Complaint:  Chief Complaint  Patient presents with  . Gynecologic Exam    History of Present Illness [name] is a 46 y.o. [id number] whose LMP was Patient's last menstrual period was 01/27/2000., presents today for her annual examination.  Her menses are absent due to TAH for endometriosis in 2002 She does not have vasomotor sx. She has had no spotting. Since her last annual exam 03/17/2015, she was seen in the ER for chest pain and tachycardia.Workup included normal EKG, negative cardiac enzymes, normal CXR, and normal echocardiogram. Has had no further chest pain, but does continue with tachycardia and complains of fatigue.   She is married and is sexually active..   Last Pap: March 17, 2015  Results were: no abnormalities  Hx of STDs: none  Last mammogram: August 22, 2015  Results were: normal--routine follow-up in 12 months There is a FH of breast cancer in her maternal grandmother and paternal great aunt and paternal cousin. Genetic testing has not been done. There is no FH of ovarian cancer. The patient does do self-breast exams.  Colonoscopy: colonoscopy 3 years ago with abnormalities (diverticulosis). Next one due in 2025 DEXA: N/A  Tobacco use: The patient is a former smoker. Quit 2009 Alcohol use: social drinker Exercise: not active  She does get adequate calcium and Vitamin D in her diet.    Review of Systems: Review of Systems  Constitutional: Positive for malaise/fatigue. Negative for chills, fever and weight loss.  HENT: Negative for congestion, sinus pain and sore throat.   Eyes: Negative for blurred vision and pain.  Respiratory: Negative for hemoptysis, shortness of breath and wheezing.   Cardiovascular: Positive for palpitations. Negative for chest pain and leg swelling.  Gastrointestinal: Positive for blood in stool and constipation. Negative for abdominal pain, diarrhea, heartburn, nausea and  vomiting.  Genitourinary: Negative for dysuria, frequency, hematuria and urgency.  Musculoskeletal: Negative for back pain, joint pain and myalgias.  Skin: Negative for itching and rash.  Neurological: Negative for dizziness, tingling and headaches.  Endo/Heme/Allergies: Negative for environmental allergies and polydipsia. Does not bruise/bleed easily.       Negative for hirsutism   Psychiatric/Behavioral: Negative for depression. The patient is not nervous/anxious and does not have insomnia.      Past Medical History:  Past Medical History:  Diagnosis Date  . Allergy   . Asthma   . Cardiac disease   . Diffuse cystic mastopathy   . GERD (gastroesophageal reflux disease)   . Guttate psoriasis   . H/O syncope    cards work-up WNL (Hochrein) thought 2/2 overmedication vs arrhythmias  . Hx of endometriosis    hysterectomy  . Hx of migraines    occasional; improved since hysterectomy  . Labral tear of shoulder 02/2014   R, pending surgery Tamera Punt)  . Rosacea    mild    Past Surgical History:  Past Surgical History:  Procedure Laterality Date  . BREAST BIOPSY  2010   left; core biopsy  . BREAST EXCISIONAL BIOPSY Bilateral    multiple bilateral  . BREAST MASS EXCISION  2008  . BREAST SURGERY Right    excision of lesion  . CARDIAC CATHETERIZATION  2007  . CARDIAC CATHETERIZATION    . CHOLECYSTECTOMY  2008  . COLONOSCOPY  2006   WNL Nicolasa Ducking)  . COLONOSCOPY  06/2013   mod diverticulosis, rpt 10 yrs Henrene Pastor)  . LAPAROSCOPY  1994   endometriosis  .  TONSILLECTOMY  1994  . TONSILLECTOMY AND ADENOIDECTOMY  1995  . TOTAL ABDOMINAL HYSTERECTOMY     Dr Rayford Halsted for endometriosis    Medications: Prior to Admission medications   Medication Sig Start Date End Date Taking? Authorizing Provider  docusate sodium (COLACE) 100 MG capsule Take 100 mg by mouth 2 (two) times daily as needed for mild constipation.   Yes Historical Provider, MD  polyethylene glycol (MIRALAX / GLYCOLAX)  packet Take 17 g by mouth daily as needed.   Yes Historical Provider, MD    Allergies:  Allergies  Allergen Reactions  . Fish Allergy Anaphylaxis, Hives and Swelling  . Shellfish Allergy Anaphylaxis, Hives and Swelling  . Penicillins Rash and Other (See Comments)    Parents are allergic Has patient had a PCN reaction causing immediate rash, facial/tongue/throat swelling, SOB or lightheadedness with hypotension: Yes Has patient had a PCN reaction causing severe rash involving mucus membranes or skin necrosis: Unk Has patient had a PCN reaction that required hospitalization: Unk Has patient had a PCN reaction occurring within the last 10 years: No If all of the above answers are "NO", then may proceed with Cephalosporin use.No   . Tape Rash    Gynecologic History: Endometriosis and s/p TAH Obstetric History: [id number]  Social History:  Social History   Social History  . Marital status: Married    Spouse name: N/A  . Number of children: 2  . Years of education: N/A   Occupational History  . Glass blower/designer    Social History Main Topics  . Smoking status: Former Smoker    Quit date: 02/21/2007  . Smokeless tobacco: Never Used  . Alcohol use 0.0 oz/week     Comment: occas  . Drug use: No  . Sexual activity: Yes    Birth control/ protection: Surgical   Other Topics Concern  . Not on file   Social History Narrative   Glass blower/designer   Lives with husband; 2 children; 1 dog          Family History:  Family History  Problem Relation Age of Onset  . Skin cancer Mother     melanoma  . Hypertension Mother     also had polio  . Irritable bowel syndrome Mother   . Breast cancer Mother 77  . Coronary artery disease Father     age 50's  . Hypertension Father   . Breast cancer Maternal Grandmother 35  . Stroke Maternal Grandmother   . Other Son     hyperinsulinemic  . Coronary artery disease Paternal Uncle     age 62's  . Skin cancer Sister 31    melanoma - cancer in  eye and uterus  . Thyroid disease Sister   . Breast cancer      pat. great aunt  . Irritable bowel syndrome Daughter   . Diabetes Maternal Grandfather   . Heart disease Maternal Grandfather   . Diabetes Paternal Uncle   . Epilepsy Son   . Breast cancer Cousin     second cousin  . Pulmonary embolism Other     died from this after MVA  . Heart disease Paternal Grandmother   . Heart disease Paternal Grandfather   . Colon cancer Neg Hx      Physical Exam Vitals: BP 110/70   Pulse 83   Ht 5\' 5"  (1.651 m)   Wt 91.6 kg (202 lb)   LMP 01/27/2000   BMI 33.61 kg/m   Physical Exam  Constitutional:  She is oriented to person, place, and time. She appears well-developed and well-nourished.  HENT:  Head: Normocephalic and atraumatic.  Neck: Normal range of motion. Neck supple. No thyromegaly present.  Cardiovascular: Normal rate and regular rhythm.   No murmur heard. Respiratory: Effort normal and breath sounds normal. She has no wheezes. She has no rales. She exhibits no tenderness. Right breast exhibits no inverted nipple, no mass, no nipple discharge, no skin change and no tenderness. Left breast exhibits no inverted nipple, no mass, no nipple discharge, no skin change and no tenderness.  GI: Soft. She exhibits no distension and no mass. There is no tenderness. There is no rebound and no guarding.  Genitourinary: Rectum normal and vagina normal. There is no rash, tenderness or lesion on the right labia. There is no rash, tenderness or lesion on the left labia. Right adnexum displays no mass and no tenderness. Left adnexum displays no mass and no tenderness. No erythema, tenderness or bleeding in the vagina.  Genitourinary Comments: Cervix: surgically absent Uterus: surgically absent   Musculoskeletal: Normal range of motion.  Lymphadenopathy:    She has no cervical adenopathy.    She has no axillary adenopathy.       Right: No inguinal and no supraclavicular adenopathy present.        Left: No inguinal and no supraclavicular adenopathy present.  No infraclavicular adenopathy  Neurological: She is alert and oriented to person, place, and time.  Skin: Skin is warm and dry. No rash noted.  Psychiatric: She has a normal mood and affect. Her behavior is normal. Judgment and thought content normal.     Assessment: 46 y.o. [id number] well woman exam Fatigue  Plan:   1) Pap done-discussed options for further screening: 1) Do no further Pap smears. 2) Do PAP smears annually, 3) Do PAP smears q3-5years. Patient to think about options.  2) Routine healthcare maintenance including cholesterol, diabetes screening done 02/09/2016 and was normal  3) vitamin B12 and vitamin D3 levels today for fatigue.   4) continue annual mammograms/ follow up with Dr Jamal Collin.  5) Encouraged exercise Dalia Heading, CNM  04/03/2016 8:09 AM

## 2016-04-11 ENCOUNTER — Telehealth: Payer: Self-pay | Admitting: Certified Nurse Midwife

## 2016-04-11 NOTE — Telephone Encounter (Signed)
Patient called with results of labs and PAp smear. Vitamin D level is low at 21.1. Advised to begin 2000 IU of vitamin D3 daily. Can repeat level at next annual.

## 2016-04-24 DIAGNOSIS — D1800 Hemangioma unspecified site: Secondary | ICD-10-CM | POA: Diagnosis not present

## 2016-04-24 DIAGNOSIS — L718 Other rosacea: Secondary | ICD-10-CM | POA: Diagnosis not present

## 2016-04-24 DIAGNOSIS — Z1283 Encounter for screening for malignant neoplasm of skin: Secondary | ICD-10-CM | POA: Diagnosis not present

## 2016-04-24 DIAGNOSIS — D229 Melanocytic nevi, unspecified: Secondary | ICD-10-CM | POA: Diagnosis not present

## 2016-06-14 ENCOUNTER — Other Ambulatory Visit: Payer: Self-pay

## 2016-06-14 DIAGNOSIS — Z1231 Encounter for screening mammogram for malignant neoplasm of breast: Secondary | ICD-10-CM

## 2016-06-18 NOTE — Telephone Encounter (Signed)
Erroneous encounter

## 2016-08-15 DIAGNOSIS — F4323 Adjustment disorder with mixed anxiety and depressed mood: Secondary | ICD-10-CM | POA: Diagnosis not present

## 2016-08-23 ENCOUNTER — Ambulatory Visit
Admission: RE | Admit: 2016-08-23 | Discharge: 2016-08-23 | Disposition: A | Discharge status: Home / Self Care | Payer: BLUE CROSS/BLUE SHIELD | Source: Ambulatory Visit | Attending: General Surgery | Admitting: General Surgery

## 2016-08-23 DIAGNOSIS — Z1231 Encounter for screening mammogram for malignant neoplasm of breast: Secondary | ICD-10-CM | POA: Diagnosis not present

## 2016-08-29 DIAGNOSIS — F4323 Adjustment disorder with mixed anxiety and depressed mood: Secondary | ICD-10-CM | POA: Diagnosis not present

## 2016-08-30 ENCOUNTER — Inpatient Hospital Stay: Payer: Self-pay

## 2016-08-30 ENCOUNTER — Encounter: Payer: Self-pay | Admitting: General Surgery

## 2016-08-30 ENCOUNTER — Ambulatory Visit (INDEPENDENT_AMBULATORY_CARE_PROVIDER_SITE_OTHER): Payer: BLUE CROSS/BLUE SHIELD | Admitting: General Surgery

## 2016-08-30 VITALS — BP 128/74 | HR 82 | Resp 12 | Ht 65.0 in | Wt 188.0 lb

## 2016-08-30 DIAGNOSIS — N6012 Diffuse cystic mastopathy of left breast: Secondary | ICD-10-CM | POA: Diagnosis not present

## 2016-08-30 DIAGNOSIS — N6321 Unspecified lump in the left breast, upper outer quadrant: Secondary | ICD-10-CM | POA: Diagnosis not present

## 2016-08-30 DIAGNOSIS — Z803 Family history of malignant neoplasm of breast: Secondary | ICD-10-CM

## 2016-08-30 DIAGNOSIS — N6011 Diffuse cystic mastopathy of right breast: Secondary | ICD-10-CM | POA: Diagnosis not present

## 2016-08-30 NOTE — Progress Notes (Signed)
Patient ID: Cassie [name], female   DOB: 05-01-1970, 46 y.o.   MRN: [id number]  Chief Complaint  Patient presents with  . Follow-up    mammogram     HPI Cassie [name] is a 46 y.o. female who presents for a breast evaluation. The most recent mammogram was done on 08/23/2016. Patient does perform regular self breast checks and gets regular mammograms done.   She thinks she may feel something in her left breast.  HPI  Past Medical History:  Diagnosis Date  . Allergy   . Asthma   . Cardiac disease   . Diffuse cystic mastopathy   . GERD (gastroesophageal reflux disease)   . Guttate psoriasis   . H/O syncope    cards work-up WNL (Hochrein) thought 2/2 overmedication vs arrhythmias  . Hx of endometriosis    hysterectomy  . Hx of migraines    occasional; improved since hysterectomy  . Labral tear of shoulder 02/2014   R, pending surgery Tamera Punt)  . Rosacea    mild    Past Surgical History:  Procedure Laterality Date  . BREAST BIOPSY  2010   left; core biopsy  . BREAST EXCISIONAL BIOPSY Bilateral    multiple bilateral  . BREAST MASS EXCISION  2008  . BREAST SURGERY Right    excision of lesion  . CARDIAC CATHETERIZATION  2007  . CARDIAC CATHETERIZATION    . CHOLECYSTECTOMY  2008  . COLONOSCOPY  2006   WNL Nicolasa Ducking)  . COLONOSCOPY  06/2013   mod diverticulosis, rpt 10 yrs Henrene Pastor)  . LAPAROSCOPY  1994   endometriosis  . TONSILLECTOMY  1994  . TONSILLECTOMY AND ADENOIDECTOMY  1995  . TOTAL ABDOMINAL HYSTERECTOMY     Dr Rayford Halsted for endometriosis    Family History  Problem Relation Age of Onset  . Skin cancer Mother        melanoma  . Hypertension Mother        also had polio  . Irritable bowel syndrome Mother   . Breast cancer Mother 38  . Coronary artery disease Father        age 50's  . Hypertension Father   . Breast cancer Maternal Grandmother 52  . Stroke Maternal Grandmother   . Other Son        hyperinsulinemic  . Coronary artery disease Paternal Uncle         age 66's  . Skin cancer Sister 2       melanoma - cancer in eye and uterus  . Thyroid disease Sister   . Breast cancer Unknown        pat. great aunt  . Irritable bowel syndrome Daughter   . Diabetes Maternal Grandfather   . Heart disease Maternal Grandfather   . Diabetes Paternal Uncle   . Epilepsy Son   . Breast cancer Cousin        second cousin  . Pulmonary embolism Other        died from this after MVA  . Heart disease Paternal Grandmother   . Heart disease Paternal Grandfather   . Colon cancer Neg Hx     Social History Social History  Substance Use Topics  . Smoking status: Former Smoker    Quit date: 02/21/2007  . Smokeless tobacco: Never Used  . Alcohol use 0.0 oz/week     Comment: occas    Allergies  Allergen Reactions  . Fish Allergy Anaphylaxis, Hives and Swelling  . Shellfish Allergy Anaphylaxis, Hives and Swelling  .  Penicillins Rash and Other (See Comments)    Parents are allergic Has patient had a PCN reaction causing immediate rash, facial/tongue/throat swelling, SOB or lightheadedness with hypotension: Yes Has patient had a PCN reaction causing severe rash involving mucus membranes or skin necrosis: Unk Has patient had a PCN reaction that required hospitalization: Unk Has patient had a PCN reaction occurring within the last 10 years: No If all of the above answers are "NO", then may proceed with Cephalosporin use.No   . Tape Rash    No current outpatient prescriptions on file.   No current facility-administered medications for this visit.     Review of Systems Review of Systems  Constitutional: Negative.   Respiratory: Negative.   Cardiovascular: Negative.     Blood pressure 128/74, pulse 82, resp. rate 12, height 5\' 5"  (1.651 m), weight 188 lb (85.3 kg), last menstrual period 01/27/2000.  Physical Exam Physical Exam  Constitutional: She is oriented to person, place, and time. She appears well-developed and well-nourished.  Eyes:  Conjunctivae are normal. No scleral icterus.  Neck: Neck supple.  Cardiovascular: Normal rate, regular rhythm and normal heart sounds.   Pulmonary/Chest: Effort normal and breath sounds normal. Right breast exhibits no inverted nipple, no mass, no nipple discharge, no skin change and no tenderness. Left breast exhibits mass (1.5 cm soft mass at 1 o'clk 8-9 cm from nipple). Left breast exhibits no inverted nipple, no nipple discharge, no skin change and no tenderness.  Abdominal: Soft. Bowel sounds are normal.  Lymphadenopathy:    She has no cervical adenopathy.    She has no axillary adenopathy.  Neurological: She is alert and oriented to person, place, and time.  Skin: Skin is warm and dry.  Psychiatric: She has a normal mood and affect.    Data Reviewed Mammogram-stable  Targeted ultrasound overlying the palpable soft mass 1:00 left breast 8 cm from the nipple was performed. There is noted be some prominence of the subcutaneous tissue with one view suggesting a partial outline of a mass likely a lipoma. 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 History of fibrocystic disease. Family history of breast cancer Plan    Recheck for the left breast mass in 3 months or sooner if there is any sudden change. Patient advised on continued annual mammogram and breast exam.     HPI, Physical Exam, Assessment and Plan have been scribed under the direction and in the presence of Mckinley Jewel, MD  Gaspar Cola, CMA  I have completed the exam and reviewed the above documentation for accuracy and completeness.  I agree with the above.  Haematologist has been used and any errors in dictation or transcription are unintentional.  Conny Moening G. Jamal Collin, M.D., F.A.C.S.  Junie Panning G 08/30/2016, 4:17 PM

## 2016-08-30 NOTE — Patient Instructions (Addendum)
Continue self breast exams. Call office for any new breast issues or concerns. Follow up in 3 months with left breast ultrasound and office visit with Dr Jamal Collin.

## 2016-09-20 DIAGNOSIS — F4323 Adjustment disorder with mixed anxiety and depressed mood: Secondary | ICD-10-CM | POA: Diagnosis not present

## 2016-10-18 DIAGNOSIS — F4323 Adjustment disorder with mixed anxiety and depressed mood: Secondary | ICD-10-CM | POA: Diagnosis not present

## 2016-10-31 DIAGNOSIS — F4323 Adjustment disorder with mixed anxiety and depressed mood: Secondary | ICD-10-CM | POA: Diagnosis not present

## 2016-11-10 DIAGNOSIS — N10 Acute tubulo-interstitial nephritis: Secondary | ICD-10-CM | POA: Diagnosis not present

## 2016-11-16 DIAGNOSIS — F4323 Adjustment disorder with mixed anxiety and depressed mood: Secondary | ICD-10-CM | POA: Diagnosis not present

## 2016-11-21 ENCOUNTER — Ambulatory Visit: Payer: BLUE CROSS/BLUE SHIELD | Admitting: General Surgery

## 2016-11-21 ENCOUNTER — Encounter: Payer: Self-pay | Admitting: General Surgery

## 2016-11-21 VITALS — BP 112/72 | HR 80 | Resp 12 | Ht 65.0 in | Wt 195.0 lb

## 2016-11-21 DIAGNOSIS — N6321 Unspecified lump in the left breast, upper outer quadrant: Secondary | ICD-10-CM | POA: Diagnosis not present

## 2016-11-21 DIAGNOSIS — Z803 Family history of malignant neoplasm of breast: Secondary | ICD-10-CM | POA: Diagnosis not present

## 2016-11-21 NOTE — Progress Notes (Signed)
Patient ID: Cassie [name], female   DOB: 1970-03-29, 46 y.o.   MRN: [id number]  Chief Complaint  Patient presents with  . Follow-up    HPI Cassie [name] is a 46 y.o.  Here today for a recheck of her left breast mass. No chjnge since last visit, no pain .  HPI  Past Medical History:  Diagnosis Date  . Allergy   . Asthma   . Cardiac disease   . Diffuse cystic mastopathy   . GERD (gastroesophageal reflux disease)   . Guttate psoriasis   . H/O syncope    cards work-up WNL (Hochrein) thought 2/2 overmedication vs arrhythmias  . Hx of endometriosis    hysterectomy  . Hx of migraines    occasional; improved since hysterectomy  . Labral tear of shoulder 02/2014   R, pending surgery Tamera Punt)  . Rosacea    mild    Past Surgical History:  Procedure Laterality Date  . BREAST BIOPSY  2010   left; core biopsy  . BREAST EXCISIONAL BIOPSY Bilateral    multiple bilateral  . BREAST MASS EXCISION  2008  . BREAST SURGERY Right    excision of lesion  . CARDIAC CATHETERIZATION  2007  . CARDIAC CATHETERIZATION    . CHOLECYSTECTOMY  2008  . COLONOSCOPY  2006   WNL Nicolasa Ducking)  . COLONOSCOPY  06/2013   mod diverticulosis, rpt 10 yrs Henrene Pastor)  . LAPAROSCOPY  1994   endometriosis  . TONSILLECTOMY  1994  . TONSILLECTOMY AND ADENOIDECTOMY  1995  . TOTAL ABDOMINAL HYSTERECTOMY     Dr Rayford Halsted for endometriosis    Family History  Problem Relation Age of Onset  . Skin cancer Mother        melanoma  . Hypertension Mother        also had polio  . Irritable bowel syndrome Mother   . Breast cancer Mother 7  . Coronary artery disease Father        age 52's  . Hypertension Father   . Breast cancer Maternal Grandmother 12  . Stroke Maternal Grandmother   . Other Son        hyperinsulinemic  . Coronary artery disease Paternal Uncle        age 25's  . Skin cancer Sister 53       melanoma - cancer in eye and uterus  . Thyroid disease Sister   . Breast cancer Unknown        pat. great  aunt  . Irritable bowel syndrome Daughter   . Diabetes Maternal Grandfather   . Heart disease Maternal Grandfather   . Diabetes Paternal Uncle   . Epilepsy Son   . Breast cancer Cousin        second cousin  . Pulmonary embolism Other        died from this after MVA  . Heart disease Paternal Grandmother   . Heart disease Paternal Grandfather   . Colon cancer Neg Hx     Social History Social History   Tobacco Use  . Smoking status: Former Smoker    Last attempt to quit: 02/21/2007    Years since quitting: 9.7  . Smokeless tobacco: Never Used  Substance Use Topics  . Alcohol use: Yes    Alcohol/week: 0.0 oz    Comment: occas  . Drug use: No    Allergies  Allergen Reactions  . Fish Allergy Anaphylaxis, Hives and Swelling  . Shellfish Allergy Anaphylaxis, Hives and Swelling  . Penicillins  Rash and Other (See Comments)    Parents are allergic Has patient had a PCN reaction causing immediate rash, facial/tongue/throat swelling, SOB or lightheadedness with hypotension: Yes Has patient had a PCN reaction causing severe rash involving mucus membranes or skin necrosis: Unk Has patient had a PCN reaction that required hospitalization: Unk Has patient had a PCN reaction occurring within the last 10 years: No If all of the above answers are "NO", then may proceed with Cephalosporin use.No   . Tape Rash    No current outpatient medications on file.   No current facility-administered medications for this visit.     Review of Systems Review of Systems  Constitutional: Negative.   Respiratory: Negative.   Cardiovascular: Negative.     Blood pressure 112/72, pulse 80, resp. rate 12, height 5\' 5"  (1.651 m), weight 195 lb (88.5 kg), last menstrual period 01/27/2000.  Physical Exam Physical Exam  Constitutional: She is oriented to person, place, and time. She appears well-developed and well-nourished.  Neck: Neck supple.  Pulmonary/Chest: Right breast exhibits no inverted  nipple, no mass, no nipple discharge, no skin change and no tenderness. Left breast exhibits mass. Left breast exhibits no inverted nipple, no nipple discharge, no skin change and no tenderness.    Lymphadenopathy:    She has no cervical adenopathy.  Neurological: She is alert and oriented to person, place, and time.  Skin: Skin is warm and dry.    Data Reviewed Prior notes reviewed  [place] over left breast mass 3 mos ago was normal Assessment    1 cm soft mass likely a lipoma.     History of fibrocystic disease. Family history of breast cancer    Plan     Patient to return in nine months bilateral screening mammogram with Dr. Bary Castilla. The patient is aware to call back for any questions or concerns.  HPI, Physical Exam, Assessment and Plan have been scribed under the direction and in the presence of Mckinley Jewel, MD  Gaspar Cola, CMA     I have completed the exam and reviewed the above documentation for accuracy and completeness.  I agree with the above.  Haematologist has been used and any errors in dictation or transcription are unintentional.  Burris Matherne G. Jamal Collin, M.D., F.A.C.S.    Junie Panning G 11/27/2016, 9:34 AM

## 2016-11-21 NOTE — Patient Instructions (Addendum)
Patient to return in nine  months bilateral screening mammogram with Dr. Bary Castilla. The patient is aware to call back for any questions or concerns.

## 2016-11-29 ENCOUNTER — Ambulatory Visit: Payer: BLUE CROSS/BLUE SHIELD | Admitting: General Surgery

## 2016-12-17 DIAGNOSIS — F4323 Adjustment disorder with mixed anxiety and depressed mood: Secondary | ICD-10-CM | POA: Diagnosis not present

## 2017-01-02 ENCOUNTER — Telehealth: Payer: Self-pay | Admitting: Family Medicine

## 2017-01-02 NOTE — Telephone Encounter (Signed)
Copied from [place]. Topic: Quick Communication - See Telephone Encounter >> Jan 02, 2017 12:44 PM Bea Graff, NT wrote: CRM for notification. See Telephone encounter for: Patient is calling with cough and congestion and would like to see if something can be called in for her symptoms. Mom was just diagnosed with bronchitis and a sinus infection and she thinks she may have the same thing. Uses [facility] [address].   01/02/17.

## 2017-01-03 ENCOUNTER — Ambulatory Visit: Payer: BLUE CROSS/BLUE SHIELD | Admitting: Adult Health

## 2017-01-03 VITALS — BP 118/74 | HR 117 | Temp 100.6°F | Resp 16 | Ht 65.0 in | Wt 195.0 lb

## 2017-01-03 DIAGNOSIS — R059 Cough, unspecified: Secondary | ICD-10-CM

## 2017-01-03 DIAGNOSIS — R05 Cough: Secondary | ICD-10-CM

## 2017-01-03 DIAGNOSIS — J40 Bronchitis, not specified as acute or chronic: Secondary | ICD-10-CM

## 2017-01-03 MED ORDER — LEVOFLOXACIN 500 MG PO TABS: 500.0000 mg | ORAL_TABLET | Freq: Every day | ORAL | 0 refills | Status: DC

## 2017-01-03 MED ORDER — ALBUTEROL SULFATE HFA 108 (90 BASE) MCG/ACT IN AERS: 2.0000 | INHALATION_SPRAY | Freq: Four times a day (QID) | RESPIRATORY_TRACT | 0 refills | Status: DC | PRN

## 2017-01-03 MED ORDER — ALBUTEROL SULFATE (2.5 MG/3ML) 0.083% IN NEBU
2.5000 mg | INHALATION_SOLUTION | Freq: Once | RESPIRATORY_TRACT | Status: AC
  Administered 2017-01-03: 2.5 mg via RESPIRATORY_TRACT

## 2017-01-03 MED ORDER — BENZONATATE 200 MG PO CAPS: 200.0000 mg | ORAL_CAPSULE | Freq: Two times a day (BID) | ORAL | 0 refills | Status: DC | PRN

## 2017-01-03 MED ORDER — HYDROCOD POLST-CPM POLST ER 10-8 MG/5ML PO SUER: 5.0000 mL | Freq: Every evening | ORAL | 0 refills | Status: DC | PRN

## 2017-01-03 NOTE — Telephone Encounter (Signed)
rec OV for eval. Are we able to get her in today? I don't have availability. Otherwise may schedule tomorrow with me.

## 2017-01-03 NOTE — Patient Instructions (Addendum)
Return to clinic on Monday if not improving. Seek out medical care at an urgent care or the Emergency Department if worsening.    Fever, Adult A fever is an increase in the body's temperature. It is often defined as a temperature of 100 F (38C) or higher. Short mild or moderate fevers often have no long-term effects. They also often do not need treatment. Moderate or high fevers may make you feel uncomfortable. Sometimes, they can also be a sign of a serious illness or disease. The sweating that may happen with repeated fevers or fevers that last a while may also cause you to not have enough fluid in your body (dehydration). You can take your temperature with a thermometer to see if you have a fever. A measured temperature can change with:  Age.  Time of day.  Where the thermometer is placed: ? Mouth (oral). ? Rectum (rectal). ? Ear (tympanic). ? Underarm (axillary). ? Forehead (temporal).  Follow these instructions at home: Pay attention to any changes in your symptoms. Take these actions to help with your condition:  Take over-the-counter and prescription medicines only as told by your doctor. Follow the dosing instructions carefully.  If you were prescribed an antibiotic medicine, take it as told by your doctor. Do not stop taking the antibiotic even if you start to feel better.  Rest as needed.  Drink enough fluid to keep your pee (urine) clear or pale yellow.  Sponge yourself or bathe with room-temperature water as needed. This helps to lower your body temperature . Do not use ice water.  Do not wear too many blankets or heavy clothes.  Contact a doctor if:  You throw up (vomit).  You cannot eat or drink without throwing up.  You have watery poop (diarrhea).  It hurts when you pee.  Your symptoms do not get better with treatment.  You have new symptoms.  You feel very weak. Get help right away if:  You are short of breath or have trouble breathing.  You  are dizzy or you pass out (faint).  You feel confused.  You have signs of not having enough fluid in your body, such as: ? A dry mouth. ? Peeing less. ? Looking pale.  You have very bad pain in your belly (abdomen).  You keep throwing up or having water poop.  You have a skin rash.  Your symptoms suddenly get worse. This information is not intended to replace advice given to you by your health care provider. Make sure you discuss any questions you have with your health care provider. Document Released: 09/27/2007 Document Revised: 05/26/2015 Document Reviewed: 02/11/2014 Elsevier Interactive Patient Education  2018 [place].  Cough, Adult A cough helps to clear your throat and lungs. A cough may last only 2-3 weeks (acute), or it may last longer than 8 weeks (chronic). Many different things can cause a cough. A cough may be a sign of an illness or another medical condition. Follow these instructions at home:  Pay attention to any changes in your cough.  Take medicines only as told by your doctor. ? If you were prescribed an antibiotic medicine, take it as told by your doctor. Do not stop taking it even if you start to feel better. ? Talk with your doctor before you try using a cough medicine.  Drink enough fluid to keep your pee (urine) clear or pale yellow.  If the air is dry, use a cold steam vaporizer or humidifier in your home.  Stay away from things that make you cough at work or at home.  If your cough is worse at night, try using extra pillows to raise your head up higher while you sleep.  Do not smoke, and try not to be around smoke. If you need help quitting, ask your doctor.  Do not have caffeine.  Do not drink alcohol.  Rest as needed. Contact a doctor if:  You have new problems (symptoms).  You cough up yellow fluid (pus).  Your cough does not get better after 2-3 weeks, or your cough gets worse.  Medicine does not help your cough and you are not  sleeping well.  You have pain that gets worse or pain that is not helped with medicine.  You have a fever.  You are losing weight and you do not know why.  You have night sweats. Get help right away if:  You cough up blood.  You have trouble breathing.  Your heartbeat is very fast. This information is not intended to replace advice given to you by your health care provider. Make sure you discuss any questions you have with your health care provider. Document Released: 08/31/2010 Document Revised: 05/26/2015 Document Reviewed: 02/24/2014 Elsevier Interactive Patient Education  2018 [employer]. Acute Bronchitis, Adult Acute bronchitis is when air tubes (bronchi) in the lungs suddenly get swollen. The condition can make it hard to breathe. It can also cause these symptoms:  A cough.  Coughing up clear, yellow, or green mucus.  Wheezing.  Chest congestion.  Shortness of breath.  A fever.  Body aches.  Chills.  A sore throat.  Follow these instructions at home: Medicines  Take over-the-counter and prescription medicines only as told by your doctor.  If you were prescribed an antibiotic medicine, take it as told by your doctor. Do not stop taking the antibiotic even if you start to feel better. General instructions  Rest.  Drink enough fluids to keep your pee (urine) clear or pale yellow.  Avoid smoking and secondhand smoke. If you smoke and you need help quitting, ask your doctor. Quitting will help your lungs heal faster.  Use an inhaler, cool mist vaporizer, or humidifier as told by your doctor.  Keep all follow-up visits as told by your doctor. This is important. How is this prevented? To lower your risk of getting this condition again:  Wash your hands often with soap and water. If you cannot use soap and water, use hand sanitizer.  Avoid contact with people who have cold symptoms.  Try not to touch your hands to your mouth, nose, or eyes.  Make  sure to get the flu shot every year.  Contact a doctor if:  Your symptoms do not get better in 2 weeks. Get help right away if:  You cough up blood.  You have chest pain.  You have very bad shortness of breath.  You become dehydrated.  You faint (pass out) or keep feeling like you are going to pass out.  You keep throwing up (vomiting).  You have a very bad headache.  Your fever or chills gets worse. This information is not intended to replace advice given to you by your health care provider. Make sure you discuss any questions you have with your health care provider. Document Released: 06/06/2007 Document Revised: 07/27/2015 Document Reviewed: 06/08/2015 Elsevier Interactive Patient Education  [employer].

## 2017-01-03 NOTE — Progress Notes (Signed)
   Subjective:    Patient ID: Cassie [name], female    DOB: 1970-03-04, 47 y.o.   MRN: [id number]  HPI 47 yo female in non acute distress with cough productive green and cold symptoms , nasal congestion.  Started New Years Eve.  History of pneumonia 2 to 3 years ago in December. Feels somewhat similar.   Review of Systems  Constitutional: Positive for chills, fatigue and fever.  HENT: Positive for congestion, postnasal drip, rhinorrhea, sinus pressure and sinus pain. Negative for ear pain and sore throat (only with coughing).   Eyes: Negative for discharge and itching.  Respiratory: Positive for cough, shortness of breath and wheezing.   Cardiovascular: Positive for chest pain (with coughing).  Gastrointestinal: Negative for abdominal pain.  Endocrine: Negative for polydipsia, polyphagia and polyuria.  Genitourinary: Negative for dysuria.  Musculoskeletal: Positive for myalgias (starting to day).  Skin: Negative for rash.  Allergic/Immunologic: Positive for environmental allergies and food allergies.  Neurological: Positive for light-headedness (after coughing). Negative for dizziness.  Hematological: Positive for adenopathy.  Psychiatric/Behavioral: Negative for behavioral problems, self-injury and suicidal ideas. The patient is not nervous/anxious.        Objective:   Physical Exam  Constitutional: She appears well-developed and well-nourished.  HENT:  Head: Normocephalic and atraumatic.  Right Ear: External ear normal. No middle ear effusion.  Left Ear: External ear normal.  No middle ear effusion.  Nose: Mucosal edema and rhinorrhea present.  Mouth/Throat: Uvula is midline, oropharynx is clear and moist and mucous membranes are normal.  Eyes: Conjunctivae and EOM are normal. Pupils are equal, round, and reactive to light.  Neck: Normal range of motion. Neck supple.  Cardiovascular: Regular rhythm. Tachycardia present.  Pulmonary/Chest: Effort normal and breath sounds  normal. No respiratory distress. She has no wheezes.  Lymphadenopathy:    She has no cervical adenopathy.  Nursing note and vitals reviewed.    Decreased bs , deep inspiration brings on coughing. Coughing in room    Assessment & Plan:  Bronchitis / cough / fever  Given Tylenol  1gram po in clinic [id number] exp  6/21. Albuterol nebulizer in clinic. S/p improved breath sounds, no wheeze or rhonchi, minimal cough. Patient feeling better. Meds ordered this encounter  Medications  . albuterol (PROVENTIL) (2.5 MG/3ML) 0.083% nebulizer solution 2.5 mg  . levofloxacin (LEVAQUIN) 500 MG tablet    Sig: Take 1 tablet (500 mg total) by mouth daily.    Dispense:  14 tablet    Refill:  0  . benzonatate (TESSALON) 200 MG capsule    Sig: Take 1 capsule (200 mg total) by mouth 2 (two) times daily as needed for cough. Morning and noon    Dispense:  20 capsule    Refill:  0  . chlorpheniramine-HYDROcodone (TUSSIONEX PENNKINETIC ER) 10-8 MG/5ML SUER    Sig: Take 5 mLs by mouth at bedtime as needed for cough. At bedtime    Dispense:  30 mL    Refill:  0  Albuterol MDI 2 puff every 6 hours as needed for cough , shortness of breath or wheeze #1 no refill.   Return to clinic on Monday if not improving. Seek out medical care at an urgent care or the Emergency Department if worsening. Monitor and treat fever with OTC Motrin or Tylenol take as directed. Next dose of Tylenol at  6:30 pm. Patient verbalizes understanding and has no questions at discharge.

## 2017-01-03 NOTE — Telephone Encounter (Signed)
Spoke with pt she stated her spouse works at [employer] and she is going there today

## 2017-05-20 ENCOUNTER — Other Ambulatory Visit: Payer: Self-pay

## 2017-05-20 DIAGNOSIS — Z1231 Encounter for screening mammogram for malignant neoplasm of breast: Secondary | ICD-10-CM

## 2017-06-28 DIAGNOSIS — Z1283 Encounter for screening for malignant neoplasm of skin: Secondary | ICD-10-CM | POA: Diagnosis not present

## 2017-06-28 DIAGNOSIS — D2370 Other benign neoplasm of skin of unspecified lower limb, including hip: Secondary | ICD-10-CM | POA: Diagnosis not present

## 2017-06-28 DIAGNOSIS — D2371 Other benign neoplasm of skin of right lower limb, including hip: Secondary | ICD-10-CM | POA: Diagnosis not present

## 2017-06-28 DIAGNOSIS — Z808 Family history of malignant neoplasm of other organs or systems: Secondary | ICD-10-CM | POA: Diagnosis not present

## 2017-08-28 ENCOUNTER — Ambulatory Visit: Payer: BLUE CROSS/BLUE SHIELD

## 2017-09-06 ENCOUNTER — Ambulatory Visit
Admission: RE | Admit: 2017-09-06 | Discharge: 2017-09-06 | Disposition: A | Discharge status: Home / Self Care | Payer: BLUE CROSS/BLUE SHIELD | Source: Ambulatory Visit | Attending: General Surgery | Admitting: General Surgery

## 2017-09-06 DIAGNOSIS — Z1231 Encounter for screening mammogram for malignant neoplasm of breast: Secondary | ICD-10-CM

## 2017-09-12 ENCOUNTER — Ambulatory Visit: Payer: BLUE CROSS/BLUE SHIELD | Admitting: General Surgery

## 2017-09-12 ENCOUNTER — Encounter: Payer: Self-pay | Admitting: General Surgery

## 2017-09-12 VITALS — BP 122/68 | HR 86 | Resp 12 | Ht 65.0 in | Wt 200.0 lb

## 2017-09-12 DIAGNOSIS — N6321 Unspecified lump in the left breast, upper outer quadrant: Secondary | ICD-10-CM | POA: Diagnosis not present

## 2017-09-12 NOTE — Patient Instructions (Addendum)
The patient has been asked to return to her PCP in one year with a screening  mammogram. The patient is aware to call back for any questions or concerns.

## 2017-09-12 NOTE — Progress Notes (Signed)
Patient ID: Cassie [name], female   DOB: [birth year], 47 y.o.   MRN: [id number]  Chief Complaint  Patient presents with  . Follow-up    HPI Cassie [name] is a 47 y.o. female who presents for a breast evaluation. The most recent mammogram was done on 09/06/2017.  Patient does perform regular self breast checks and gets regular mammograms done.    HPI  Past Medical History:  Diagnosis Date  . Allergy   . Asthma   . Cardiac disease   . Diffuse cystic mastopathy   . GERD (gastroesophageal reflux disease)   . Guttate psoriasis   . H/O syncope    cards work-up WNL (Hochrein) thought 2/2 overmedication vs arrhythmias  . Hx of endometriosis    hysterectomy  . Hx of migraines    occasional; improved since hysterectomy  . Labral tear of shoulder 02/2014   R, pending surgery Tamera Punt)  . Rosacea    mild    Past Surgical History:  Procedure Laterality Date  . BREAST BIOPSY  2010   left; core biopsy  . BREAST EXCISIONAL BIOPSY Bilateral    multiple bilateral  . BREAST MASS EXCISION  2008  . BREAST SURGERY Right    excision of lesion  . CARDIAC CATHETERIZATION  2007  . CARDIAC CATHETERIZATION    . CHOLECYSTECTOMY  2008  . COLONOSCOPY  2006   WNL Nicolasa Ducking)  . COLONOSCOPY  06/2013   mod diverticulosis, rpt 10 yrs Henrene Pastor)  . LAPAROSCOPY  1994   endometriosis  . TONSILLECTOMY  1994  . TONSILLECTOMY AND ADENOIDECTOMY  1995  . TOTAL ABDOMINAL HYSTERECTOMY     Dr Rayford Halsted for endometriosis    Family History  Problem Relation Age of Onset  . Skin cancer Mother        melanoma  . Hypertension Mother        also had polio  . Irritable bowel syndrome Mother   . Breast cancer Mother 34  . Coronary artery disease Father        age 40's  . Hypertension Father   . Breast cancer Maternal Grandmother 77  . Stroke Maternal Grandmother   . Other Son        hyperinsulinemic  . Coronary artery disease Paternal Uncle        age 71's  . Skin cancer Sister 71       melanoma - cancer  in eye and uterus  . Thyroid disease Sister   . Breast cancer Unknown        pat. great aunt  . Irritable bowel syndrome Daughter   . Diabetes Maternal Grandfather   . Heart disease Maternal Grandfather   . Diabetes Paternal Uncle   . Epilepsy Son   . Breast cancer Cousin        second cousin  . Pulmonary embolism Other        died from this after MVA  . Heart disease Paternal Grandmother   . Heart disease Paternal Grandfather   . Colon cancer Neg Hx     Social History Social History   Tobacco Use  . Smoking status: Former Smoker    Last attempt to quit: 02/21/2007    Years since quitting: 10.5  . Smokeless tobacco: Never Used  Substance Use Topics  . Alcohol use: Yes    Alcohol/week: 0.0 standard drinks    Comment: occas  . Drug use: No    Allergies  Allergen Reactions  . Fish Allergy Anaphylaxis, Hives and  Swelling  . Shellfish Allergy Anaphylaxis, Hives and Swelling  . Chocolate     migraines  . Penicillins Rash and Other (See Comments)    Parents are allergic Has patient had a PCN reaction causing immediate rash, facial/tongue/throat swelling, SOB or lightheadedness with hypotension: Yes Has patient had a PCN reaction causing severe rash involving mucus membranes or skin necrosis: Unk Has patient had a PCN reaction that required hospitalization: Unk Has patient had a PCN reaction occurring within the last 10 years: No If all of the above answers are "NO", then may proceed with Cephalosporin use.No   . Tape Rash    No current outpatient medications on file.   No current facility-administered medications for this visit.     Review of Systems Review of Systems  Constitutional: Negative.   Respiratory: Negative.   Cardiovascular: Negative.     Blood pressure 122/68, pulse 86, resp. rate 12, height 5' 5"  (1.651 m), weight 200 lb (90.7 kg), last menstrual period 01/27/2000, SpO2 97 %.  Physical Exam Physical Exam  Constitutional: She is oriented to  person, place, and time. She appears well-developed and well-nourished.  Eyes: Conjunctivae are normal. No scleral icterus.  Neck: Normal range of motion.  Cardiovascular: Normal rate, regular rhythm and normal heart sounds.  Pulmonary/Chest: Effort normal and breath sounds normal. Right breast exhibits no inverted nipple, no mass, no nipple discharge, no skin change and no tenderness. Left breast exhibits no inverted nipple, no mass (.5 cm soft mass at 1 o'clk 8-9 cm from nipple), no nipple discharge, no skin change and no tenderness.    Lymphadenopathy:    She has no cervical adenopathy.  Neurological: She is alert and oriented to person, place, and time.  Skin: Skin is warm and dry.    Data Reviewed September 06, 2017 bilateral mammograms were reviewed.  No areas of architectural distortion or mass-effect.  No calcifications of concern.  BI-RADS-1.  Review of these images show the majority of the dense breast parenchyma is centrally located in the area behind the nipple areolar complex.  In the upper outer quadrant breast is essentially fatty replaced.  Ultrasound completed last fall showed no evidence of architectural distortion, cystic or solid lesions or mass-effect.  Assessment    Unremarkable breast exam.    Plan The patient has a family history of breast cancer, but none with early onset and only one first-degree relative.  I think it is unlikely should be a candidate for BRCA testing.  Today's exam is incredibly normal, and I think that as she is very comfortable with breast self-exam it is reasonable for her to continue monthly self exams and annual screening mammograms with her primary care provider.  She is been encouraged to call at any time should she have concerns about a change in exam or if mammographic abnormalities develop.    The patient has been asked to return to her PCP in one year with a screening  mammogram. The patient is aware to call back for any questions or  concerns.   HPI, Physical Exam, Assessment and Plan have been scribed under the direction and in the presence of Hervey Ard, MD.  Gaspar Cola, CMA   I have completed the exam and reviewed the above documentation for accuracy and completeness.  I agree with the above.  Haematologist has been used and any errors in dictation or transcription are unintentional.  Hervey Ard, M.D., F.A.C.S.  Forest Gleason [place] 09/13/2017, 7:03 AM

## 2017-11-06 ENCOUNTER — Telehealth: Payer: Self-pay

## 2017-11-06 DIAGNOSIS — Z90710 Acquired absence of both cervix and uterus: Secondary | ICD-10-CM | POA: Diagnosis not present

## 2017-11-06 DIAGNOSIS — Z9889 Other specified postprocedural states: Secondary | ICD-10-CM | POA: Diagnosis not present

## 2017-11-06 DIAGNOSIS — Z87891 Personal history of nicotine dependence: Secondary | ICD-10-CM | POA: Diagnosis not present

## 2017-11-06 DIAGNOSIS — R42 Dizziness and giddiness: Secondary | ICD-10-CM | POA: Diagnosis not present

## 2017-11-06 DIAGNOSIS — R40241 Glasgow coma scale score 13-15, unspecified time: Secondary | ICD-10-CM | POA: Diagnosis not present

## 2017-11-06 DIAGNOSIS — Z9049 Acquired absence of other specified parts of digestive tract: Secondary | ICD-10-CM | POA: Diagnosis not present

## 2017-11-06 NOTE — Telephone Encounter (Signed)
Pt is out of town in [place] and has H/A, dizziness and seeing black spots (never saw black spots before). Pt has scheduled an appt 11/08/17 with Dr Danise Mina when returns home. Advised pt should be evaluated at walkin or UC where pt is at. Pt said there is a [facility] right down the road and she will go there for an eval. Pt will keep appt with Dr Danise Mina on 11/08/17. FYI to Dr Danise Mina.

## 2017-11-07 MED ORDER — SODIUM CHLORIDE 0.9 % IV SOLN
100.00 | INTRAVENOUS | Status: DC
Start: ? — End: 2017-11-07

## 2017-11-08 ENCOUNTER — Encounter: Payer: Self-pay | Admitting: Family Medicine

## 2017-11-08 ENCOUNTER — Ambulatory Visit: Payer: BLUE CROSS/BLUE SHIELD | Admitting: Family Medicine

## 2017-11-08 VITALS — BP 138/78 | HR 98 | Temp 98.6°F | Ht 65.0 in | Wt 198.5 lb

## 2017-11-08 DIAGNOSIS — H43393 Other vitreous opacities, bilateral: Secondary | ICD-10-CM | POA: Diagnosis not present

## 2017-11-08 DIAGNOSIS — R55 Syncope and collapse: Secondary | ICD-10-CM | POA: Diagnosis not present

## 2017-11-08 DIAGNOSIS — Z6379 Other stressful life events affecting family and household: Secondary | ICD-10-CM

## 2017-11-08 MED ORDER — SERTRALINE HCL 25 MG PO TABS: 25.0000 mg | ORAL_TABLET | Freq: Every day | ORAL | 6 refills | Status: DC

## 2017-11-08 MED ORDER — SODIUM CHLORIDE 0.9 % IV SOLN
100.00 | INTRAVENOUS | Status: DC
Start: ? — End: 2017-11-08

## 2017-11-08 NOTE — Assessment & Plan Note (Addendum)
Benign exam, benign workup in ER which was reviewed.  Discussed possible carotid US. No bruits today.  Normal echo 2018 reviewed.  Recommend ophtho eval for floaters.  Monitor symptoms for now.  rec return for CPE.

## 2017-11-08 NOTE — Assessment & Plan Note (Signed)
Without photopsia or other vision changes. Did suggest formal ophtho eval.

## 2017-11-08 NOTE — Assessment & Plan Note (Signed)
Recent events reviewed, support provided. Reasonable to try SSRI course with holiday season coming on. Start sertraline 25mg  daily. Discussed watching for nausea, headache.

## 2017-11-08 NOTE — Progress Notes (Signed)
BP 138/78 (BP Location: Left Arm, Patient Position: Sitting, Cuff Size: Large)   Pulse 98   Temp 98.6 F (37 C) (Oral)   Ht 5\' 5"  (1.651 m)   Wt 198 lb 8 oz (90 kg)   LMP 01/27/2000   SpO2 96%   BMI 33.03 kg/m   Orthostatic VS for the past 24 hrs (Last 3 readings):  BP- Lying BP- Standing at 0 minutes  11/08/17 0928 - 120/78  11/08/17 0927 126/84 -    CC: ER f/u visit Subjective:    Patient ID: Cassie [name], female    DOB: February 01, 1970, 47 y.o.   MRN: [id number]  HPI: Cassie [name] is a 47 y.o. female presenting on 11/08/2017 for Dizziness (C/o dizziness, floaters, HA and elevated BP. Still seeing floaters. Sxs started 11/01/17. Seen at [facility] ER on 11/06/17. )   Not feeling well over the past week. Floaters, blurry vision, presyncope without LOC, cloudy headache feeling. Has noted elevated BP readings recently - checking regularly last few days at work bp up to 141/101, but averaging well controlled. Increased fatigue.   No chest pain, palpitations, dyspnea or cough. No photopsia or double vision.   Seen at [facility] ER 11/06/2017 for these symptoms, records reviewed. Labs including TSH, A1c UDS, UPreg, UA, CBC and CMP normal. Head CT normal.   Son Cassie [name]'s trial ended earlier this year - he was convicted and will be in prison for 7 yrs. High stress recently, now things settling down. Thinks she may struggle with holiday season, interested in medication.   Relevant past medical, surgical, family and social history reviewed and updated as indicated. Interim medical history since our last visit reviewed. Allergies and medications reviewed and updated. No outpatient medications prior to visit.   No facility-administered medications prior to visit.      Per HPI unless specifically indicated in ROS section below Review of Systems     Objective:    BP 138/78 (BP Location: Left Arm, Patient Position: Sitting, Cuff Size: Large)   Pulse 98   Temp 98.6  F (37 C) (Oral)   Ht 5\' 5"  (1.651 m)   Wt 198 lb 8 oz (90 kg)   LMP 01/27/2000   SpO2 96%   BMI 33.03 kg/m   Wt Readings from Last 3 Encounters:  11/08/17 198 lb 8 oz (90 kg)  09/12/17 200 lb (90.7 kg)  01/03/17 195 lb (88.5 kg)    Physical Exam  Constitutional: She appears well-developed and well-nourished. No distress.  HENT:  Mouth/Throat: Oropharynx is clear and moist. No oropharyngeal exudate.  Eyes: Pupils are equal, round, and reactive to light. Conjunctivae and EOM are normal.  No papilledema appreciated on limited fundoscopic exam  Neck: Carotid bruit is not present. No thyromegaly present.  Cardiovascular: Normal rate, regular rhythm and normal heart sounds.  No murmur heard. Pulmonary/Chest: Effort normal and breath sounds normal. No respiratory distress. She has no wheezes. She has no rales.  Musculoskeletal: She exhibits no edema.  Neurological: She is alert. She has normal strength. No cranial nerve deficit or sensory deficit.  CN 2-12 intact EOMI, PERRLA  Psychiatric: She has a normal mood and affect.  Nursing note and vitals reviewed.     Assessment & Plan:   Problem List Items Addressed This Visit    Stressful life events affecting family and household    Recent events reviewed, support provided. Reasonable to try SSRI course with holiday season coming on. Start sertraline  25mg  daily. Discussed watching for nausea, headache.       Pre-syncope - Primary    Benign exam, benign workup in ER which was reviewed.  Discussed possible carotid US. No bruits today.  Normal echo 2018 reviewed.  Recommend ophtho eval for floaters.  Monitor symptoms for now.  rec return for CPE.       Floaters, bilateral    Without photopsia or other vision changes. Did suggest formal ophtho eval.       Relevant Orders   Ambulatory referral to Ophthalmology       Meds ordered this encounter  Medications  . sertraline (ZOLOFT) 25 MG tablet    Sig: Take 1 tablet (25 mg  total) by mouth daily.    Dispense:  30 tablet    Refill:  6   Orders Placed This Encounter  Procedures  . Ambulatory referral to Ophthalmology    Referral Priority:   Routine    Referral Type:   Consultation    Referral Reason:   Specialty Services Required    Requested Specialty:   Ophthalmology    Number of Visits Requested:   1    Follow up plan: Return in about 6 weeks (around 12/20/2017) for annual exam, prior fasting for blood work.  Ria Bush, MD

## 2017-11-08 NOTE — Patient Instructions (Addendum)
We will refer you to eye doctor for evaluation. Start sertraline 25mg  daily for mood.  Return in 1-2 months for physical.

## 2017-11-15 DIAGNOSIS — H43393 Other vitreous opacities, bilateral: Secondary | ICD-10-CM | POA: Diagnosis not present

## 2017-12-03 DIAGNOSIS — J029 Acute pharyngitis, unspecified: Secondary | ICD-10-CM | POA: Diagnosis not present

## 2017-12-03 DIAGNOSIS — J040 Acute laryngitis: Secondary | ICD-10-CM | POA: Diagnosis not present

## 2017-12-03 DIAGNOSIS — J019 Acute sinusitis, unspecified: Secondary | ICD-10-CM | POA: Diagnosis not present

## 2017-12-03 DIAGNOSIS — J209 Acute bronchitis, unspecified: Secondary | ICD-10-CM | POA: Diagnosis not present

## 2017-12-12 ENCOUNTER — Other Ambulatory Visit: Payer: Self-pay | Admitting: Family Medicine

## 2017-12-12 DIAGNOSIS — E669 Obesity, unspecified: Secondary | ICD-10-CM

## 2017-12-12 DIAGNOSIS — E559 Vitamin D deficiency, unspecified: Secondary | ICD-10-CM | POA: Insufficient documentation

## 2017-12-12 DIAGNOSIS — R42 Dizziness and giddiness: Secondary | ICD-10-CM

## 2017-12-13 ENCOUNTER — Other Ambulatory Visit (INDEPENDENT_AMBULATORY_CARE_PROVIDER_SITE_OTHER): Payer: BLUE CROSS/BLUE SHIELD

## 2017-12-13 DIAGNOSIS — R42 Dizziness and giddiness: Secondary | ICD-10-CM

## 2017-12-13 DIAGNOSIS — E559 Vitamin D deficiency, unspecified: Secondary | ICD-10-CM | POA: Diagnosis not present

## 2017-12-13 DIAGNOSIS — E669 Obesity, unspecified: Secondary | ICD-10-CM | POA: Diagnosis not present

## 2017-12-13 LAB — COMPREHENSIVE METABOLIC PANEL
ALT: 16 U/L (ref 0–35)
AST: 15 U/L (ref 0–37)
Albumin: 4.6 g/dL (ref 3.5–5.2)
Alkaline Phosphatase: 82 U/L (ref 39–117)
BUN: 17 mg/dL (ref 6–23)
CO2: 27 mEq/L (ref 19–32)
Calcium: 10.3 mg/dL (ref 8.4–10.5)
Chloride: 104 mEq/L (ref 96–112)
Creatinine, Ser: 1.07 mg/dL (ref 0.40–1.20)
GFR: 58.94 mL/min — ABNORMAL LOW (ref >60.00)
Glucose, Bld: 100 mg/dL — ABNORMAL HIGH (ref 70–99)
Potassium: 4.5 mEq/L (ref 3.5–5.1)
Sodium: 141 mEq/L (ref 135–145)
Total Bilirubin: 0.7 mg/dL (ref 0.2–1.2)
Total Protein: 7.4 g/dL (ref 6.0–8.3)

## 2017-12-13 LAB — LIPID PANEL
Cholesterol: 174 mg/dL (ref 0–200)
HDL: 59.1 mg/dL (ref >39.00)
LDL Cholesterol: 94 mg/dL (ref 0–99)
NonHDL: 115.37
Total CHOL/HDL Ratio: 3
Triglycerides: 108 mg/dL (ref 0.0–149.0)
VLDL: 21.6 mg/dL (ref 0.0–40.0)

## 2017-12-13 LAB — VITAMIN B12: Vitamin B-12: 415 pg/mL (ref 211–911)

## 2017-12-13 LAB — VITAMIN D 25 HYDROXY (VIT D DEFICIENCY, FRACTURES): VITD: 22.43 ng/mL — ABNORMAL LOW (ref 30.00–100.00)

## 2017-12-20 ENCOUNTER — Encounter: Payer: Self-pay | Admitting: Family Medicine

## 2017-12-20 ENCOUNTER — Ambulatory Visit (INDEPENDENT_AMBULATORY_CARE_PROVIDER_SITE_OTHER): Payer: BLUE CROSS/BLUE SHIELD | Admitting: Family Medicine

## 2017-12-20 VITALS — BP 122/82 | HR 79 | Temp 98.4°F | Ht 64.5 in | Wt 194.5 lb

## 2017-12-20 DIAGNOSIS — E669 Obesity, unspecified: Secondary | ICD-10-CM

## 2017-12-20 DIAGNOSIS — Z0000 Encounter for general adult medical examination without abnormal findings: Secondary | ICD-10-CM | POA: Diagnosis not present

## 2017-12-20 DIAGNOSIS — Z23 Encounter for immunization: Secondary | ICD-10-CM | POA: Diagnosis not present

## 2017-12-20 DIAGNOSIS — Z808 Family history of malignant neoplasm of other organs or systems: Secondary | ICD-10-CM

## 2017-12-20 DIAGNOSIS — E559 Vitamin D deficiency, unspecified: Secondary | ICD-10-CM

## 2017-12-20 DIAGNOSIS — Z6379 Other stressful life events affecting family and household: Secondary | ICD-10-CM | POA: Diagnosis not present

## 2017-12-20 MED ORDER — VITAMIN D3 25 MCG (1000 UT) PO CAPS: 1.0000 | ORAL_CAPSULE | Freq: Every day | ORAL | Status: DC

## 2017-12-20 NOTE — Progress Notes (Signed)
BP 122/82 (BP Location: Left Arm, Patient Position: Sitting, Cuff Size: Large)   Pulse 79   Temp 98.4 F (36.9 C) (Oral)   Ht 5' 4.5" (1.638 m)   Wt 194 lb 8 oz (88.2 kg)   LMP 01/27/2000   SpO2 98%   BMI 32.87 kg/m    CC: CPE Subjective:    Patient ID: Cassie [name], female    DOB: [birth year], 47 y.o.   MRN: [id number]  HPI: Cassie [name] is a 47 y.o. female presenting on 12/20/2017 for Annual Exam   Sertraline may have increased emotionality.   Preventative: COLONOSCOPY 06/2013 - mod diverticulosis, rpt 10 yrs Henrene Pastor) Breast cancer screening - mammogram 09/2017 Lung cancer screening - not due  Well woman exam - with [facility] last seen 03/2016. Last pap 03/2016.  DEXA scan - not yet due Flu shot - declines Td 2009, Tdap today.  Seat belt use discussed Sunscreen use discussed. No changing moles on skin. Yearly derm check. Sister h/o melanoma.  Smoking - ex smoker, quit 2009 Alcohol - none Dentist - q6 mo Eye exam - yearly  Lives with husband; 2 children; 1 Biomedical engineer at [employer] Activity - no regular exercise Diet - good water, limited fiber and fruits/vegetables  Relevant past medical, surgical, family and social history reviewed and updated as indicated. Interim medical history since our last visit reviewed. Allergies and medications reviewed and updated. Outpatient Medications Prior to Visit  Medication Sig Dispense Refill  . sertraline (ZOLOFT) 25 MG tablet Take 1 tablet (25 mg total) by mouth daily. 30 tablet 6   No facility-administered medications prior to visit.      Per HPI unless specifically indicated in ROS section below Review of Systems  Constitutional: Positive for fever. Negative for activity change, appetite change, chills, fatigue and unexpected weight change.  HENT: Positive for congestion. Negative for hearing loss.   Eyes: Negative for visual disturbance.  Respiratory: Positive for cough. Negative for chest tightness,  shortness of breath and wheezing.   Cardiovascular: Negative for chest pain, palpitations and leg swelling.  Gastrointestinal: Positive for constipation. Negative for abdominal distention, abdominal pain, blood in stool, diarrhea, nausea and vomiting.  Genitourinary: Negative for difficulty urinating and hematuria.  Musculoskeletal: Negative for arthralgias, myalgias and neck pain.  Skin: Negative for rash.  Neurological: Positive for dizziness. Negative for seizures, syncope and headaches.  Hematological: Negative for adenopathy. Does not bruise/bleed easily.  Psychiatric/Behavioral: Negative for dysphoric mood. The patient is not nervous/anxious.        Objective:    BP 122/82 (BP Location: Left Arm, Patient Position: Sitting, Cuff Size: Large)   Pulse 79   Temp 98.4 F (36.9 C) (Oral)   Ht 5' 4.5" (1.638 m)   Wt 194 lb 8 oz (88.2 kg)   LMP 01/27/2000   SpO2 98%   BMI 32.87 kg/m   Wt Readings from Last 3 Encounters:  12/20/17 194 lb 8 oz (88.2 kg)  11/08/17 198 lb 8 oz (90 kg)  09/12/17 200 lb (90.7 kg)    Physical Exam Vitals signs and nursing note reviewed.  Constitutional:      General: She is not in acute distress.    Appearance: She is well-developed.  HENT:     Head: Normocephalic and atraumatic.     Right Ear: Hearing, tympanic membrane, ear canal and external ear normal.     Left Ear: Hearing, tympanic membrane, ear canal and external ear normal.  Nose: Nose normal.     Mouth/Throat:     Pharynx: Uvula midline. No oropharyngeal exudate or posterior oropharyngeal erythema.  Eyes:     General: No scleral icterus.    Conjunctiva/sclera: Conjunctivae normal.     Pupils: Pupils are equal, round, and reactive to light.  Neck:     Musculoskeletal: Normal range of motion and neck supple.  Cardiovascular:     Rate and Rhythm: Normal rate and regular rhythm.     Pulses:          Radial pulses are 2+ on the right side and 2+ on the left side.     Heart sounds:  Normal heart sounds. No murmur.  Pulmonary:     Effort: Pulmonary effort is normal. No respiratory distress.     Breath sounds: Normal breath sounds. No wheezing or rales.  Abdominal:     General: Bowel sounds are normal. There is no distension.     Palpations: Abdomen is soft. There is no mass.     Tenderness: There is no abdominal tenderness. There is no guarding or rebound.  Musculoskeletal: Normal range of motion.  Lymphadenopathy:     Cervical: No cervical adenopathy.  Skin:    General: Skin is warm and dry.     Findings: No rash.  Neurological:     Mental Status: She is alert and oriented to person, place, and time.     Comments: CN grossly intact, station and gait intact  Psychiatric:        Behavior: Behavior normal.        Thought Content: Thought content normal.        Judgment: Judgment normal.    Results for orders placed or performed in visit on 12/13/17  Vitamin B12  Result Value Ref Range   Vitamin B-12 415 211 - 911 pg/mL  Comprehensive metabolic panel  Result Value Ref Range   Sodium 141 135 - 145 mEq/L   Potassium 4.5 3.5 - 5.1 mEq/L   Chloride 104 96 - 112 mEq/L   CO2 27 19 - 32 mEq/L   Glucose, Bld 100 (H) 70 - 99 mg/dL   BUN 17 6 - 23 mg/dL   Creatinine, Ser 1.07 0.40 - 1.20 mg/dL   Total Bilirubin 0.7 0.2 - 1.2 mg/dL   Alkaline Phosphatase 82 39 - 117 U/L   AST 15 0 - 37 U/L   ALT 16 0 - 35 U/L   Total Protein 7.4 6.0 - 8.3 g/dL   Albumin 4.6 3.5 - 5.2 g/dL   Calcium 10.3 8.4 - 10.5 mg/dL   GFR 58.94 (L) >60.00 mL/min  VITAMIN D 25 Hydroxy (Vit-D Deficiency, Fractures)  Result Value Ref Range   VITD 22.43 (L) 30.00 - 100.00 ng/mL  Lipid panel  Result Value Ref Range   Cholesterol 174 0 - 200 mg/dL   Triglycerides 108.0 0.0 - 149.0 mg/dL   HDL 59.10 >39.00 mg/dL   VLDL 21.6 0.0 - 40.0 mg/dL   LDL Cholesterol 94 0 - 99 mg/dL   Total CHOL/HDL Ratio 3    NonHDL 115.37    Depression screen PHQ 2/9 12/20/2017  Decreased Interest 2  Down,  Depressed, Hopeless 1  PHQ - 2 Score 3  Altered sleeping 2  Tired, decreased energy 2  Change in appetite 0  Feeling bad or failure about yourself  0  Trouble concentrating 0  Moving slowly or fidgety/restless 0  Suicidal thoughts 0  PHQ-9 Score 7   GAD 7 :  Generalized Anxiety Score 12/20/2017  Nervous, Anxious, on Edge 0  Worry too much - different things 1  Trouble relaxing 1  Restless 0  Easily annoyed or irritable 0  Afraid - awful might happen 0        Assessment & Plan:   Problem List Items Addressed This Visit    Vitamin D deficiency    Restart 1000 IU daily.       Stressful life events affecting family and household    Support provided.  Sertraline worsened crying. Will stop. Pt desires to stay off medication at this time. PHQ9 reviewed.       Obesity, Class I, BMI 30-34.9    Encouraged healthy diet and lifestyle changes to affect sustainable weight loss.       Healthcare maintenance - Primary    Preventative protocols reviewed and updated unless pt declined. Discussed healthy diet and lifestyle.       Family history of melanoma    Sees derm yearly.       Other Visit Diagnoses    Need for Tdap vaccination       Relevant Orders   Tdap vaccine greater than or equal to 7yo IM (Completed)       Meds ordered this encounter  Medications  . Cholecalciferol (VITAMIN D3) 25 MCG (1000 UT) CAPS    Sig: Take 1 capsule (1,000 Units total) by mouth daily.    Dispense:  30 capsule   Orders Placed This Encounter  Procedures  . Tdap vaccine greater than or equal to 7yo IM    Follow up plan: Return in about 1 year (around 12/21/2018) for annual exam, prior fasting for blood work.  Ria Bush, MD

## 2017-12-20 NOTE — Assessment & Plan Note (Signed)
Encouraged healthy diet and lifestyle changes to affect sustainable weight loss.  

## 2017-12-20 NOTE — Assessment & Plan Note (Signed)
Preventative protocols reviewed and updated unless pt declined. Discussed healthy diet and lifestyle.  

## 2017-12-20 NOTE — Assessment & Plan Note (Signed)
Support provided.  Sertraline worsened crying. Will stop. Pt desires to stay off medication at this time. PHQ9 reviewed.

## 2017-12-20 NOTE — Assessment & Plan Note (Signed)
Restart 1000 IU daily.

## 2017-12-20 NOTE — Patient Instructions (Addendum)
Tdap today (tetanus and whooping cough) Stop sertraline.  Start vitamin D 1000 units daily.  Return in 1 year for next physical.   Health Maintenance, Female Adopting a healthy lifestyle and getting preventive care can go a long way to promote health and wellness. Talk with your health care provider about what schedule of regular examinations is right for you. This is a good chance for you to check in with your provider about disease prevention and staying healthy. In between checkups, there are plenty of things you can do on your own. Experts have done a lot of research about which lifestyle changes and preventive measures are most likely to keep you healthy. Ask your health care provider for more information. Weight and diet Eat a healthy diet  Be sure to include plenty of vegetables, fruits, low-fat dairy products, and lean protein.  Do not eat a lot of foods high in solid fats, added sugars, or salt.  Get regular exercise. This is one of the most important things you can do for your health. ? Most adults should exercise for at least 150 minutes each week. The exercise should increase your heart rate and make you sweat (moderate-intensity exercise). ? Most adults should also do strengthening exercises at least twice a week. This is in addition to the moderate-intensity exercise. Maintain a healthy weight  Body mass index (BMI) is a measurement that can be used to identify possible weight problems. It estimates body fat based on height and weight. Your health care provider can help determine your BMI and help you achieve or maintain a healthy weight.  For females 31 years of age and older: ? A BMI below 18.5 is considered underweight. ? A BMI of 18.5 to 24.9 is normal. ? A BMI of 25 to 29.9 is considered overweight. ? A BMI of 30 and above is considered obese. Watch levels of cholesterol and blood lipids  You should start having your blood tested for lipids and cholesterol at 45 years  of age, then have this test every 5 years.  You may need to have your cholesterol levels checked more often if: ? Your lipid or cholesterol levels are high. ? You are older than 45 years of age. ? You are at high risk for heart disease. Cancer screening Lung Cancer  Lung cancer screening is recommended for adults 34-78 years old who are at high risk for lung cancer because of a history of smoking.  A yearly low-dose CT scan of the lungs is recommended for people who: ? Currently smoke. ? Have quit within the past 15 years. ? Have at least a 30-pack-year history of smoking. A pack year is smoking an average of one pack of cigarettes a day for 1 year.  Yearly screening should continue until it has been 15 years since you quit.  Yearly screening should stop if you develop a health problem that would prevent you from having lung cancer treatment. Breast Cancer  Practice breast self-awareness. This means understanding how your breasts normally appear and feel.  It also means doing regular breast self-exams. Let your health care provider know about any changes, no matter how small.  If you are in your 20s or 30s, you should have a clinical breast exam (CBE) by a health care provider every 1-3 years as part of a regular health exam.  If you are 55 or older, have a CBE every year. Also consider having a breast X-ray (mammogram) every year.  If you have a  family history of breast cancer, talk to your health care provider about genetic screening.  If you are at high risk for breast cancer, talk to your health care provider about having an MRI and a mammogram every year.  Breast cancer gene (BRCA) assessment is recommended for women who have family members with BRCA-related cancers. BRCA-related cancers include: ? Breast. ? Ovarian. ? Tubal. ? Peritoneal cancers.  Results of the assessment will determine the need for genetic counseling and BRCA1 and BRCA2 testing. Cervical Cancer Your  health care provider may recommend that you be screened regularly for cancer of the pelvic organs (ovaries, uterus, and vagina). This screening involves a pelvic examination, including checking for microscopic changes to the surface of your cervix (Pap test). You may be encouraged to have this screening done every 3 years, beginning at age 66.  For women ages 58-65, health care providers may recommend pelvic exams and Pap testing every 3 years, or they may recommend the Pap and pelvic exam, combined with testing for human papilloma virus (HPV), every 5 years. Some types of HPV increase your risk of cervical cancer. Testing for HPV may also be done on women of any age with unclear Pap test results.  Other health care providers may not recommend any screening for nonpregnant women who are considered low risk for pelvic cancer and who do not have symptoms. Ask your health care provider if a screening pelvic exam is right for you.  If you have had past treatment for cervical cancer or a condition that could lead to cancer, you need Pap tests and screening for cancer for at least 20 years after your treatment. If Pap tests have been discontinued, your risk factors (such as having a new sexual partner) need to be reassessed to determine if screening should resume. Some women have medical problems that increase the chance of getting cervical cancer. In these cases, your health care provider may recommend more frequent screening and Pap tests. Colorectal Cancer  This type of cancer can be detected and often prevented.  Routine colorectal cancer screening usually begins at 45 years of age and continues through 45 years of age.  Your health care provider may recommend screening at an earlier age if you have risk factors for colon cancer.  Your health care provider may also recommend using home test kits to check for hidden blood in the stool.  A small camera at the end of a tube can be used to examine your  colon directly (sigmoidoscopy or colonoscopy). This is done to check for the earliest forms of colorectal cancer.  Routine screening usually begins at age 57.  Direct examination of the colon should be repeated every 5-10 years through 45 years of age. However, you may need to be screened more often if early forms of precancerous polyps or small growths are found. Skin Cancer  Check your skin from head to toe regularly.  Tell your health care provider about any new moles or changes in moles, especially if there is a change in a mole's shape or color.  Also tell your health care provider if you have a mole that is larger than the size of a pencil eraser.  Always use sunscreen. Apply sunscreen liberally and repeatedly throughout the day.  Protect yourself by wearing long sleeves, pants, a wide-brimmed hat, and sunglasses whenever you are outside. Heart disease, diabetes, and high blood pressure  High blood pressure causes heart disease and increases the risk of stroke. High blood pressure  is more likely to develop in: ? People who have blood pressure in the high end of the normal range (130-139/85-89 mm Hg). ? People who are overweight or obese. ? People who are African American.  If you are 59-36 years of age, have your blood pressure checked every 3-5 years. If you are 15 years of age or older, have your blood pressure checked every year. You should have your blood pressure measured twice-once when you are at a hospital or clinic, and once when you are not at a hospital or clinic. Record the average of the two measurements. To check your blood pressure when you are not at a hospital or clinic, you can use: ? An automated blood pressure machine at a pharmacy. ? A home blood pressure monitor.  If you are between 17 years and 8 years old, ask your health care provider if you should take aspirin to prevent strokes.  Have regular diabetes screenings. This involves taking a blood sample to  check your fasting blood sugar level. ? If you are at a normal weight and have a low risk for diabetes, have this test once every three years after 45 years of age. ? If you are overweight and have a high risk for diabetes, consider being tested at a younger age or more often. Preventing infection Hepatitis B  If you have a higher risk for hepatitis B, you should be screened for this virus. You are considered at high risk for hepatitis B if: ? You were born in a country where hepatitis B is common. Ask your health care provider which countries are considered high risk. ? Your parents were born in a high-risk country, and you have not been immunized against hepatitis B (hepatitis B vaccine). ? You have HIV or AIDS. ? You use needles to inject street drugs. ? You live with someone who has hepatitis B. ? You have had sex with someone who has hepatitis B. ? You get hemodialysis treatment. ? You take certain medicines for conditions, including cancer, organ transplantation, and autoimmune conditions. Hepatitis C  Blood testing is recommended for: ? Everyone born from [birth year] through 1965. ? Anyone with known risk factors for hepatitis C. Sexually transmitted infections (STIs)  You should be screened for sexually transmitted infections (STIs) including gonorrhea and chlamydia if: ? You are sexually active and are younger than 45 years of age. ? You are older than 45 years of age and your health care provider tells you that you are at risk for this type of infection. ? Your sexual activity has changed since you were last screened and you are at an increased risk for chlamydia or gonorrhea. Ask your health care provider if you are at risk.  If you do not have HIV, but are at risk, it may be recommended that you take a prescription medicine daily to prevent HIV infection. This is called pre-exposure prophylaxis (PrEP). You are considered at risk if: ? You are sexually active and do not regularly use  condoms or know the HIV status of your partner(s). ? You take drugs by injection. ? You are sexually active with a partner who has HIV. Talk with your health care provider about whether you are at high risk of being infected with HIV. If you choose to begin PrEP, you should first be tested for HIV. You should then be tested every 3 months for as long as you are taking PrEP. Pregnancy  If you are premenopausal and you may become  pregnant, ask your health care provider about preconception counseling.  If you may become pregnant, take 400 to 800 micrograms (mcg) of folic acid every day.  If you want to prevent pregnancy, talk to your health care provider about birth control (contraception). Osteoporosis and menopause  Osteoporosis is a disease in which the bones lose minerals and strength with aging. This can result in serious bone fractures. Your risk for osteoporosis can be identified using a bone density scan.  If you are 48 years of age or older, or if you are at risk for osteoporosis and fractures, ask your health care provider if you should be screened.  Ask your health care provider whether you should take a calcium or vitamin D supplement to lower your risk for osteoporosis.  Menopause may have certain physical symptoms and risks.  Hormone replacement therapy may reduce some of these symptoms and risks. Talk to your health care provider about whether hormone replacement therapy is right for you. Follow these instructions at home:  Schedule regular health, dental, and eye exams.  Stay current with your immunizations.  Do not use any tobacco products including cigarettes, chewing tobacco, or electronic cigarettes.  If you are pregnant, do not drink alcohol.  If you are breastfeeding, limit how much and how often you drink alcohol.  Limit alcohol intake to no more than 1 drink per day for nonpregnant women. One drink equals 12 ounces of beer, 5 ounces of wine, or 1 ounces of  hard liquor.  Do not use street drugs.  Do not share needles.  Ask your health care provider for help if you need support or information about quitting drugs.  Tell your health care provider if you often feel depressed.  Tell your health care provider if you have ever been abused or do not feel safe at home. This information is not intended to replace advice given to you by your health care provider. Make sure you discuss any questions you have with your health care provider. Document Released: 07/03/2010 Document Revised: 05/26/2015 Document Reviewed: 09/21/2014 Elsevier Interactive Patient Education  2019 [employer].

## 2017-12-20 NOTE — Assessment & Plan Note (Signed)
Sees derm yearly

## 2018-02-07 DIAGNOSIS — H43393 Other vitreous opacities, bilateral: Secondary | ICD-10-CM | POA: Diagnosis not present

## 2018-07-07 DIAGNOSIS — Z1283 Encounter for screening for malignant neoplasm of skin: Secondary | ICD-10-CM | POA: Diagnosis not present

## 2018-07-07 DIAGNOSIS — L718 Other rosacea: Secondary | ICD-10-CM | POA: Diagnosis not present

## 2018-07-07 DIAGNOSIS — D1800 Hemangioma unspecified site: Secondary | ICD-10-CM | POA: Diagnosis not present

## 2018-07-07 DIAGNOSIS — L249 Irritant contact dermatitis, unspecified cause: Secondary | ICD-10-CM | POA: Diagnosis not present

## 2018-07-14 ENCOUNTER — Other Ambulatory Visit: Payer: BC Managed Care – PPO

## 2018-07-14 ENCOUNTER — Telehealth: Payer: Self-pay | Admitting: *Deleted

## 2018-07-14 ENCOUNTER — Ambulatory Visit (INDEPENDENT_AMBULATORY_CARE_PROVIDER_SITE_OTHER): Payer: BC Managed Care – PPO | Admitting: Family Medicine

## 2018-07-14 ENCOUNTER — Telehealth: Payer: Self-pay | Admitting: Family Medicine

## 2018-07-14 ENCOUNTER — Encounter: Payer: Self-pay | Admitting: Family Medicine

## 2018-07-14 ENCOUNTER — Other Ambulatory Visit: Payer: Self-pay

## 2018-07-14 VITALS — Ht 64.5 in | Wt 195.0 lb

## 2018-07-14 DIAGNOSIS — R197 Diarrhea, unspecified: Secondary | ICD-10-CM

## 2018-07-14 DIAGNOSIS — Z20822 Contact with and (suspected) exposure to covid-19: Secondary | ICD-10-CM

## 2018-07-14 DIAGNOSIS — R51 Headache: Secondary | ICD-10-CM

## 2018-07-14 DIAGNOSIS — Z20828 Contact with and (suspected) exposure to other viral communicable diseases: Secondary | ICD-10-CM

## 2018-07-14 DIAGNOSIS — R519 Headache, unspecified: Secondary | ICD-10-CM

## 2018-07-14 DIAGNOSIS — R6889 Other general symptoms and signs: Secondary | ICD-10-CM | POA: Diagnosis not present

## 2018-07-14 NOTE — Telephone Encounter (Signed)
-----   Message from Ria Bush, MD sent at 07/14/2018  8:44 AM EDT ----- Regarding: covid testing I would like patient tested. HA, diarrhea for last few days, + covid exposure at work.  Thanks, Garlon Hatchet

## 2018-07-14 NOTE — Progress Notes (Signed)
Virtual visit completed through [employer]. Due to national recommendations of social distancing due to COVID-19, a virtual visit is felt to be most appropriate for this patient at this time. Reviewed limitations of a virtual visit.   Patient location: home Provider location: [place] at [facility], office If any vitals were documented, they were collected by patient at home unless specified below.    Ht 5' 4.5" (1.638 m)   Wt 195 lb (88.5 kg)   LMP 01/27/2000   BMI 32.95 kg/m    CC: ?Covid concern Subjective:    Patient ID: Cassie [name], female    DOB: 30-Jun-1970, 48 y.o.   MRN: [id number]  HPI: DARBY FLEEMAN is a 48 y.o. female presenting on 07/14/2018 for Headache (C/o HA since 07/10/18.  ) and Diarrhea (C/o diarrhea that started 07/11/18.  Denies fever, cough, SOB, etc. Informed last night that 2 coworkers tested positive for COVID-19. )   3-4d h/o HA, diarrhea. Bad headache that made her leave work early. Stayed on couch all weekend. HA improving - bilateral frontal sinus headache. Diarrhea with episode of fecal incontinence yesterday. Multiple episodes of diarrhea per day (5+). Normally constipated. Mild sinus congestion. Has lost some sense of smell. Some sniffles.   No fevers/chills, cough, dypsnea, ST, loss of taste or smell.   Treating with aleve, ibuprofen, pseudophed with mild improvement.   Just found out 2 coworkers who went home last Monday due to GI symptoms tested positive for Covid19. 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 Interim medical history since our last visit reviewed. Allergies and medications reviewed and updated. Outpatient Medications Prior to Visit  Medication Sig Dispense Refill  . Cholecalciferol (VITAMIN D3) 25 MCG (1000 UT) CAPS Take 1 capsule  (1,000 Units total) by mouth daily. 30 capsule    No facility-administered medications prior to visit.      Per HPI unless specifically indicated in ROS section below Review of Systems Objective:    Ht 5' 4.5" (1.638 m)   Wt 195 lb (88.5 kg)   LMP 01/27/2000   BMI 32.95 kg/m   Wt Readings from Last 3 Encounters:  07/14/18 195 lb (88.5 kg)  12/20/17 194 lb 8 oz (88.2 kg)  11/08/17 198 lb 8 oz (90 kg)     Physical exam: Gen: alert, NAD, not ill appearing Pulm: speaks in complete sentences without increased work of breathing Psych: normal mood, normal thought content      Assessment & Plan:   Problem List Items Addressed This Visit    Frontal headache - Primary    Severe frontal headache over weekend, now improved, with ongoing diarrhea in setting of exposure to [place]. Will request she be tested. Reviewed criteria to return to work, letter provided for work. Reviewed self quarantining and self isolation from rest of family at home. Pt agrees with plan.        Other Visit Diagnoses    Diarrhea, unspecified type       Exposure to Covid-19 Virus           No orders of the defined types were placed in this encounter.  No orders of the defined types were placed in this encounter.   I discussed the assessment and treatment plan with the patient. The  patient was provided an opportunity to ask questions and all were answered. The patient agreed with the plan and demonstrated an understanding of the instructions. The patient was advised to call back or seek an in-person evaluation if the symptoms worsen or if the condition fails to improve as anticipated.  Follow up plan: Return if symptoms worsen or fail to improve.  Ria Bush, MD

## 2018-07-14 NOTE — Assessment & Plan Note (Signed)
Severe frontal headache over weekend, now improved, with ongoing diarrhea in setting of exposure to [place]. Will request she be tested. Reviewed criteria to return to work, letter provided for work. Reviewed self quarantining and self isolation from rest of family at home. Pt agrees with plan.

## 2018-07-14 NOTE — Telephone Encounter (Signed)
Scheduled patient for COVID 19 test today at 10:15 am at [facility].  Testing protocol reviewed with patient.

## 2018-07-14 NOTE — Telephone Encounter (Signed)
Plz fill out [place] health department form for patient being sent for Covid19 testing. plz call her in 2 days for an update on symptoms. Current symptoms are diarrhea, HA, sinus congestion.

## 2018-07-15 ENCOUNTER — Other Ambulatory Visit: Payer: Self-pay

## 2018-07-15 ENCOUNTER — Telehealth: Payer: Self-pay | Admitting: *Deleted

## 2018-07-15 ENCOUNTER — Ambulatory Visit (HOSPITAL_COMMUNITY)
Admission: EM | Admit: 2018-07-15 | Discharge: 2018-07-15 | Disposition: A | Discharge status: Home / Self Care | Payer: BC Managed Care – PPO | Attending: Emergency Medicine | Admitting: Emergency Medicine

## 2018-07-15 DIAGNOSIS — Z20822 Contact with and (suspected) exposure to covid-19: Secondary | ICD-10-CM

## 2018-07-15 DIAGNOSIS — R197 Diarrhea, unspecified: Secondary | ICD-10-CM | POA: Diagnosis not present

## 2018-07-15 DIAGNOSIS — R6889 Other general symptoms and signs: Secondary | ICD-10-CM | POA: Diagnosis not present

## 2018-07-15 DIAGNOSIS — J019 Acute sinusitis, unspecified: Secondary | ICD-10-CM | POA: Diagnosis not present

## 2018-07-15 DIAGNOSIS — R05 Cough: Secondary | ICD-10-CM | POA: Diagnosis not present

## 2018-07-15 DIAGNOSIS — Z20828 Contact with and (suspected) exposure to other viral communicable diseases: Secondary | ICD-10-CM

## 2018-07-15 MED ORDER — AZITHROMYCIN 250 MG PO TABS: 250.0000 mg | ORAL_TABLET | Freq: Every day | ORAL | 0 refills | Status: DC

## 2018-07-15 NOTE — Telephone Encounter (Signed)
Patient called stating that she is worse today than she was yesterday, Patient stated that the diarrhea has gotten better, Patient stated that she is having a cough that is irritating. Patient stated that she is having some heaviness in her chest at time and feels like she may be getting bronchitis. Patient stated that she has a history of pneumonia and is concerned that may happen again. Cassie [name]

## 2018-07-15 NOTE — Discharge Instructions (Signed)
Take the Zithromax as prescribed.  Take ibuprofen and Mucinex as needed.    You should self quarantine until your COVID results are back and are negative.    Go to the emergency department if you develop shortness of breath, difficulty breathing, vomiting, worsening diarrhea, high fever, or other concerning symptoms.

## 2018-07-15 NOTE — ED Triage Notes (Signed)
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 Pt has been having diarrhea, sinus issues and had talked to her dr, but today pt says that it has became a slight cough with hoarseness AND STILL DIARRHEA.  No sob, no fevers, no chills. Pt says has hx of Bronchitis.

## 2018-07-15 NOTE — Telephone Encounter (Signed)
I'm sorry she's not doing better.  I recommend [facility] eval for in person evaluation and set of vitals and possible CXR if needed.

## 2018-07-15 NOTE — ED Provider Notes (Signed)
[place]    CSN: [id number] Arrival date & time: 07/15/18  [place]     History   Chief Complaint Chief Complaint  Patient presents with  . Cough  . Diarrhea    HPI Cassie [name] is a 48 y.o. female.   Patient presents with sinus congestion x 5-6 days.  She then developed diarrhea, nonproductive cough, and vocal hoarseness x 2-3 days.  She reports there are COVID positive coworkers; she was tested for [place] yesterday.  She denies fever, chills, shortness of breath, chest pain, abdominal pain, nausea, vomiting.  LMP: hysterectomy.   The history is provided by the patient.    Past Medical History:  Diagnosis Date  . Allergy   . Asthma   . Cardiac disease   . Diffuse cystic mastopathy   . GERD (gastroesophageal reflux disease)   . Guttate psoriasis   . H/O syncope    cards work-up WNL (Hochrein) thought 2/2 overmedication vs arrhythmias  . Hx of endometriosis    hysterectomy  . Hx of migraines    occasional; improved since hysterectomy  . Labral tear of shoulder 02/2014   R, pending surgery Tamera Punt)  . Rosacea    mild    Patient Active Problem List   Diagnosis Date Noted  . Frontal headache 07/14/2018  . Vitamin D deficiency 12/12/2017  . Floaters, bilateral 11/08/2017  . Pre-syncope 11/08/2017  . Stressful life events affecting family and household 11/08/2017  . Chest pain at rest 02/09/2016  . Extrinsic asthma 01/20/2016  . Labral tear of shoulder 02/01/2014  . Bowel habit changes 04/28/2013  . Obesity, Class I, BMI 30-34.9 01/09/2013  . Diffuse cystic mastopathy 08/18/2012  . Family history of breast cancer 08/18/2012  . Healthcare maintenance 02/27/2011  . Family history of melanoma 02/27/2011  . SYNCOPE, HX OF 08/11/2009  . BREAST MASS, HX OF 08/11/2009    Past Surgical History:  Procedure Laterality Date  . BREAST BIOPSY  2010   left; core biopsy  . BREAST EXCISIONAL BIOPSY Bilateral    multiple bilateral  . BREAST MASS EXCISION   2008  . BREAST SURGERY Right    excision of lesion  . CARDIAC CATHETERIZATION  2007  . CHOLECYSTECTOMY  2008  . COLONOSCOPY  2006   WNL Nicolasa Ducking)  . COLONOSCOPY  06/2013   mod diverticulosis, rpt 10 yrs Henrene Pastor)  . LAPAROSCOPY  1994   endometriosis  . TONSILLECTOMY  1994  . TONSILLECTOMY AND ADENOIDECTOMY  1995  . TOTAL ABDOMINAL HYSTERECTOMY     Dr Rayford Halsted for endometriosis    OB History    Gravida  3   Para  2   Term  2   Preterm      AB  1   Living  2     SAB  1   TAB      Ectopic      Multiple      Live Births  2        Obstetric Comments  1st Menstrual Cycle: 13 1st Pregnancy:  23         Home Medications    Prior to Admission medications   Medication Sig Start Date End Date Taking? Authorizing Provider  azithromycin (ZITHROMAX) 250 MG tablet Take 1 tablet (250 mg total) by mouth daily. Take first 2 tablets together, then 1 every day until finished. 07/15/18   Sharion Balloon, NP  Cholecalciferol (VITAMIN D3) 25 MCG (1000 UT) CAPS Take 1 capsule (1,000 Units  total) by mouth daily. 12/20/17   Ria Bush, MD    Family History Family History  Problem Relation Age of Onset  . Skin cancer Mother        melanoma  . Hypertension Mother        also had polio  . Irritable bowel syndrome Mother   . Breast cancer Mother 8  . Coronary artery disease Father        age 41's  . Hypertension Father   . Breast cancer Maternal Grandmother 15  . Stroke Maternal Grandmother   . Other Son        hyperinsulinemic  . Coronary artery disease Paternal Uncle        age 17's  . Skin cancer Sister 35       melanoma - cancer in eye and uterus  . Thyroid disease Sister   . Breast cancer Other        pat. great aunt  . Irritable bowel syndrome Daughter   . Diabetes Maternal Grandfather   . Heart disease Maternal Grandfather   . Diabetes Paternal Uncle   . Epilepsy Son   . Breast cancer Cousin        second cousin  . Pulmonary embolism Other        died  from this after MVA  . Heart disease Paternal Grandmother   . Heart disease Paternal Grandfather   . Colon cancer Neg Hx     Social History Social History   Tobacco Use  . Smoking status: Former Smoker    Quit date: 02/21/2007    Years since quitting: 11.4  . Smokeless tobacco: Never Used  Substance Use Topics  . Alcohol use: Yes    Alcohol/week: 0.0 standard drinks    Comment: occas  . Drug use: No     Allergies   Fish allergy, Shellfish allergy, Chocolate, Penicillins, and Tape   Review of Systems Review of Systems  Constitutional: Negative for chills and fever.  HENT: Positive for congestion, sinus pressure and voice change. Negative for ear pain, sore throat and trouble swallowing.   Eyes: Negative for pain and visual disturbance.  Respiratory: Positive for cough. Negative for shortness of breath, wheezing and stridor.   Cardiovascular: Negative for chest pain and palpitations.  Gastrointestinal: Positive for diarrhea. Negative for abdominal pain and vomiting.  Genitourinary: Negative for dysuria and hematuria.  Musculoskeletal: Negative for arthralgias and back pain.  Skin: Negative for color change and rash.  Neurological: Negative for seizures and syncope.  All other systems reviewed and are negative.    Physical Exam Triage Vital Signs ED Triage Vitals [07/15/18 1814]  Enc Vitals Group     BP (!) 141/88     Pulse Rate 96     Resp 16     Temp 98.7 F (37.1 C)     Temp Source Oral     SpO2 97 %     Weight      Height      Head Circumference      Peak Flow      Pain Score 0     Pain Loc      Pain Edu?      Excl. in [place]?    No data found.  Updated Vital Signs BP (!) 141/88 (BP Location: Right Arm)   Pulse 96   Temp 98.7 F (37.1 C) (Oral)   Resp 16   LMP 01/27/2000   SpO2 97%   Visual Acuity Right Eye  Distance:   Left Eye Distance:   Bilateral Distance:    Right Eye Near:   Left Eye Near:    Bilateral Near:     Physical Exam Vitals  signs and nursing note reviewed.  Constitutional:      General: She is not in acute distress.    Appearance: She is well-developed.  HENT:     Head: Normocephalic and atraumatic.     Right Ear: Tympanic membrane normal.     Left Ear: Tympanic membrane normal.     Nose: Nose normal.     Mouth/Throat:     Mouth: Mucous membranes are moist.     Pharynx: Oropharynx is clear.  Eyes:     Conjunctiva/sclera: Conjunctivae normal.  Neck:     Musculoskeletal: Neck supple.  Cardiovascular:     Rate and Rhythm: Normal rate and regular rhythm.     Heart sounds: Normal heart sounds.  Pulmonary:     Effort: Pulmonary effort is normal. No respiratory distress.     Breath sounds: Normal breath sounds. No stridor. No wheezing.  Abdominal:     Palpations: Abdomen is soft.     Tenderness: There is no abdominal tenderness. There is no guarding or rebound.  Skin:    General: Skin is warm and dry.  Neurological:     General: No focal deficit present.     Mental Status: She is alert and oriented to person, place, and time.     Motor: No weakness.      UC Treatments / Results  Labs (all labs ordered are listed, but only abnormal results are displayed) Labs Reviewed - No data to display  EKG   Radiology No results found.  Procedures Procedures (including critical care time)  Medications Ordered in UC Medications - No data to display  Initial Impression / Assessment and Plan / UC Course  I have reviewed the triage vital signs and the nursing notes.  Pertinent labs & imaging results that were available during my care of the patient were reviewed by me and considered in my medical decision making (see chart for details).   Sinusitis.  Suspect COVID.  Treating today with Z-Pak, ibuprofen, Mucinex.  Instructed patient to self quarantine until her COVID test results are back and are negative.  Strict instructions given to patient that she should go to the emergency department if she develops  shortness of breath, difficulty breathing, vomiting, worsening diarrhea, high fever, chest pain, other symptoms.    Final Clinical Impressions(s) / UC Diagnoses   Final diagnoses:  Acute non-recurrent sinusitis, unspecified location  Suspected Covid-19 Virus Infection     Discharge Instructions     Take the Zithromax as prescribed.  Take ibuprofen and Mucinex as needed.    You should self quarantine until your COVID results are back and are negative.    Go to the emergency department if you develop shortness of breath, difficulty breathing, vomiting, worsening diarrhea, high fever, or other concerning symptoms.       ED Prescriptions    Medication Sig Dispense Auth. Provider   azithromycin (ZITHROMAX) 250 MG tablet Take 1 tablet (250 mg total) by mouth daily. Take first 2 tablets together, then 1 every day until finished. 6 tablet Sharion Balloon, NP     Controlled Substance Prescriptions [place] Controlled Substance Registry consulted? Not Applicable   Sharion Balloon, NP 07/15/18 1845

## 2018-07-15 NOTE — Telephone Encounter (Signed)
Patient notified as instructed by telephone and verbalized understanding. Patient stated that her husband just got home from work and will discuss this with him. Patient stated that she will probably go to [facility] for evaluation.

## 2018-07-16 NOTE — Telephone Encounter (Addendum)
Spoke with pt asking how she is feeling.  States she is not feeling better but was able to get some sleep last night.  C/o nasal/head congestion, some chest congestion, hoarseness, minor cough and diarrhea.  Denies any fever. Recently started on Zpack.

## 2018-07-18 LAB — NOVEL CORONAVIRUS, NAA: SARS-CoV-2, NAA: NOT DETECTED

## 2018-07-23 ENCOUNTER — Telehealth: Payer: Self-pay | Admitting: Family Medicine

## 2018-07-23 NOTE — Telephone Encounter (Signed)
Please advise if there is a specific format you would like this letter in. Thanks.

## 2018-07-23 NOTE — Telephone Encounter (Signed)
plz ensure symptoms are largely improved prior to return to work.  If so, letter written and in CMA box.  She may be able to access it through mychart?

## 2018-07-23 NOTE — Telephone Encounter (Signed)
Best number [phone] ext [phone]  Or  [phone] cell  Pt called stating she had covid test 7/13 and her results were neg.  She needs letter so she can go back to work  Please advise when ready for pick up

## 2018-07-24 NOTE — Telephone Encounter (Signed)
LM on VM making aware letter is on Mychart.  Recommendations per Dr Darnell Level given - requested a call back

## 2018-07-24 NOTE — Telephone Encounter (Signed)
Patient stated that she is unable to get the letter on her my chart. She called the my chart assistance line and they stated that it was something on our end.   She would like to know if this could be faxed to her. She is at her office so she would get it as soon as it comes through [phone]

## 2018-07-24 NOTE — Telephone Encounter (Signed)
Patient returned Ashtyn's call.  Patient said her symptoms are largely improved.  Patient couldn't find the letter in my chart.  I gave patient the number to my chart to see if they could help her find the letter.  I let her know if she has any problems, to call back.

## 2018-07-24 NOTE — Telephone Encounter (Signed)
Is this ok to do?

## 2018-07-25 NOTE — Telephone Encounter (Signed)
Spoke with patient and she states this was taking care of already. Nothing further to be done

## 2018-07-25 NOTE — Telephone Encounter (Signed)
Yes please fax

## 2018-08-30 ENCOUNTER — Ambulatory Visit (HOSPITAL_COMMUNITY)
Admission: EM | Admit: 2018-08-30 | Discharge: 2018-08-30 | Disposition: A | Discharge status: Home / Self Care | Payer: BC Managed Care – PPO | Attending: Family Medicine | Admitting: Family Medicine

## 2018-08-30 ENCOUNTER — Encounter (HOSPITAL_COMMUNITY): Payer: Self-pay | Admitting: Emergency Medicine

## 2018-08-30 ENCOUNTER — Other Ambulatory Visit: Payer: Self-pay

## 2018-08-30 DIAGNOSIS — U071 COVID-19: Secondary | ICD-10-CM | POA: Diagnosis not present

## 2018-08-30 DIAGNOSIS — J069 Acute upper respiratory infection, unspecified: Secondary | ICD-10-CM

## 2018-08-30 DIAGNOSIS — R059 Cough, unspecified: Secondary | ICD-10-CM

## 2018-08-30 DIAGNOSIS — R05 Cough: Secondary | ICD-10-CM | POA: Diagnosis not present

## 2018-08-30 DIAGNOSIS — R6889 Other general symptoms and signs: Secondary | ICD-10-CM | POA: Insufficient documentation

## 2018-08-30 DIAGNOSIS — Z20822 Contact with and (suspected) exposure to covid-19: Secondary | ICD-10-CM

## 2018-08-30 DIAGNOSIS — R509 Fever, unspecified: Secondary | ICD-10-CM

## 2018-08-30 DIAGNOSIS — Z20828 Contact with and (suspected) exposure to other viral communicable diseases: Secondary | ICD-10-CM | POA: Diagnosis not present

## 2018-08-30 MED ORDER — FLUTICASONE PROPIONATE 50 MCG/ACT NA SUSP: 1.0000 | Freq: Every day | NASAL | 0 refills | Status: DC

## 2018-08-30 NOTE — ED Triage Notes (Signed)
Pt sts URI sx with sinus congestion and now having cough and some fever

## 2018-08-30 NOTE — ED Provider Notes (Signed)
[place]    CSN: [id number] Arrival date & time: 08/30/18  1105      History   Chief Complaint Chief Complaint  Patient presents with  . Appointment    1110  . Cough  . URI    HPI Cassie [name] is a 48 y.o. female.   Patient presents with sinus congestion x3 days, nonproductive cough and low-grade fever x2 days.  She states she has been exposed to [place] at work.  She denies sore throat, shortness of breath, vomiting, diarrhea, or other symptoms.  Patient states she has been taking Mucinex at home without relief.    The history is provided by the patient.    Past Medical History:  Diagnosis Date  . Allergy   . Asthma   . Cardiac disease   . Diffuse cystic mastopathy   . GERD (gastroesophageal reflux disease)   . Guttate psoriasis   . H/O syncope    cards work-up WNL (Hochrein) thought 2/2 overmedication vs arrhythmias  . Hx of endometriosis    hysterectomy  . Hx of migraines    occasional; improved since hysterectomy  . Labral tear of shoulder 02/2014   R, pending surgery Tamera Punt)  . Rosacea    mild    Patient Active Problem List   Diagnosis Date Noted  . Frontal headache 07/14/2018  . Vitamin D deficiency 12/12/2017  . Floaters, bilateral 11/08/2017  . Pre-syncope 11/08/2017  . Stressful life events affecting family and household 11/08/2017  . Chest pain at rest 02/09/2016  . Extrinsic asthma 01/20/2016  . Labral tear of shoulder 02/01/2014  . Bowel habit changes 04/28/2013  . Obesity, Class I, BMI 30-34.9 01/09/2013  . Diffuse cystic mastopathy 08/18/2012  . Family history of breast cancer 08/18/2012  . Healthcare maintenance 02/27/2011  . Family history of melanoma 02/27/2011  . SYNCOPE, HX OF 08/11/2009  . BREAST MASS, HX OF 08/11/2009    Past Surgical History:  Procedure Laterality Date  . BREAST BIOPSY  2010   left; core biopsy  . BREAST EXCISIONAL BIOPSY Bilateral    multiple bilateral  . BREAST MASS EXCISION  2008  .  BREAST SURGERY Right    excision of lesion  . CARDIAC CATHETERIZATION  2007  . CHOLECYSTECTOMY  2008  . COLONOSCOPY  2006   WNL Nicolasa Ducking)  . COLONOSCOPY  06/2013   mod diverticulosis, rpt 10 yrs Henrene Pastor)  . LAPAROSCOPY  1994   endometriosis  . TONSILLECTOMY  1994  . TONSILLECTOMY AND ADENOIDECTOMY  1995  . TOTAL ABDOMINAL HYSTERECTOMY     Dr Rayford Halsted for endometriosis    OB History    Gravida  3   Para  2   Term  2   Preterm      AB  1   Living  2     SAB  1   TAB      Ectopic      Multiple      Live Births  2        Obstetric Comments  1st Menstrual Cycle: 13 1st Pregnancy:  23         Home Medications    Prior to Admission medications   Medication Sig Start Date End Date Taking? Authorizing Provider  azithromycin (ZITHROMAX) 250 MG tablet Take 1 tablet (250 mg total) by mouth daily. Take first 2 tablets together, then 1 every day until finished. Patient not taking: Reported on 08/30/2018 07/15/18   Sharion Balloon, NP  Cholecalciferol (VITAMIN D3) 25 MCG (1000 UT) CAPS Take 1 capsule (1,000 Units total) by mouth daily. 12/20/17   Ria Bush, MD  fluticasone [facility]) 50 MCG/ACT nasal spray Place 1 spray into both nostrils daily. 08/30/18   Sharion Balloon, NP    Family History Family History  Problem Relation Age of Onset  . Skin cancer Mother        melanoma  . Hypertension Mother        also had polio  . Irritable bowel syndrome Mother   . Breast cancer Mother 58  . Coronary artery disease Father        age 47's  . Hypertension Father   . Breast cancer Maternal Grandmother 68  . Stroke Maternal Grandmother   . Other Son        hyperinsulinemic  . Coronary artery disease Paternal Uncle        age 20's  . Skin cancer Sister 51       melanoma - cancer in eye and uterus  . Thyroid disease Sister   . Breast cancer Other        pat. great aunt  . Irritable bowel syndrome Daughter   . Diabetes Maternal Grandfather   . Heart disease Maternal  Grandfather   . Diabetes Paternal Uncle   . Epilepsy Son   . Breast cancer Cousin        second cousin  . Pulmonary embolism Other        died from this after MVA  . Heart disease Paternal Grandmother   . Heart disease Paternal Grandfather   . Colon cancer Neg Hx     Social History Social History   Tobacco Use  . Smoking status: Former Smoker    Quit date: 02/21/2007    Years since quitting: 11.5  . Smokeless tobacco: Never Used  Substance Use Topics  . Alcohol use: Yes    Alcohol/week: 0.0 standard drinks    Comment: occas  . Drug use: No     Allergies   Fish allergy, Shellfish allergy, Chocolate, Penicillins, and Tape   Review of Systems Review of Systems  Constitutional: Positive for fever. Negative for chills.  HENT: Positive for congestion. Negative for ear pain and sore throat.   Eyes: Negative for pain and visual disturbance.  Respiratory: Positive for cough. Negative for shortness of breath.   Cardiovascular: Negative for chest pain and palpitations.  Gastrointestinal: Negative for abdominal pain, diarrhea and vomiting.  Genitourinary: Negative for dysuria and hematuria.  Musculoskeletal: Negative for arthralgias and back pain.  Skin: Negative for color change and rash.  Neurological: Negative for seizures and syncope.  All other systems reviewed and are negative.    Physical Exam Triage Vital Signs ED Triage Vitals  Enc Vitals Group     BP      Pulse      Resp      Temp      Temp src      SpO2      Weight      Height      Head Circumference      Peak Flow      Pain Score      Pain Loc      Pain Edu?      Excl. in [place]?    No data found.  Updated Vital Signs BP 131/84 (BP Location: Right Arm)   Pulse 97   Temp 99.5 F (37.5 C) (Oral)   Resp 18  LMP 01/27/2000   SpO2 97%   Visual Acuity Right Eye Distance:   Left Eye Distance:   Bilateral Distance:    Right Eye Near:   Left Eye Near:    Bilateral Near:     Physical Exam  Vitals signs and nursing note reviewed.  Constitutional:      General: She is not in acute distress.    Appearance: She is well-developed.  HENT:     Head: Normocephalic and atraumatic.     Right Ear: Tympanic membrane normal.     Left Ear: Tympanic membrane normal.     Nose: Nose normal.     Mouth/Throat:     Mouth: Mucous membranes are moist.     Pharynx: Oropharynx is clear.  Eyes:     Conjunctiva/sclera: Conjunctivae normal.  Neck:     Musculoskeletal: Neck supple.  Cardiovascular:     Rate and Rhythm: Normal rate and regular rhythm.     Heart sounds: No murmur.  Pulmonary:     Effort: Pulmonary effort is normal. No respiratory distress.     Breath sounds: Normal breath sounds. No wheezing or rhonchi.  Abdominal:     Palpations: Abdomen is soft.     Tenderness: There is no abdominal tenderness. There is no guarding or rebound.  Skin:    General: Skin is warm and dry.     Findings: No rash.  Neurological:     Mental Status: She is alert.      UC Treatments / Results  Labs (all labs ordered are listed, but only abnormal results are displayed) Labs Reviewed  NOVEL CORONAVIRUS, NAA (HOSP ORDER, SEND-OUT TO REF LAB; TAT 18-24 HRS)    EKG   Radiology No results found.  Procedures Procedures (including critical care time)  Medications Ordered in UC Medications - No data to display  Initial Impression / Assessment and Plan / UC Course  I have reviewed the triage vital signs and the nursing notes.  Pertinent labs & imaging results that were available during my care of the patient were reviewed by me and considered in my medical decision making (see chart for details).    Acute upper respiratory infection, cough, suspected COVID.  Treating with Flonase and Mucinex.  COVID test performed here.  Instructed patient to self quarantine until her test results are back.  Instructed patient to go to the emergency department if she develops high fever, shortness of  breath, severe diarrhea, or other concerning symptoms.  Patient agrees with plan of care.     Final Clinical Impressions(s) / UC Diagnoses   Final diagnoses:  Acute upper respiratory infection  Cough  Suspected Covid-19 Virus Infection     Discharge Instructions     Use the Flonase as prescribed.  Take Mucinex as needed for your congestion.  Your COVID test is pending.  You should self quarantine until your test result is back and is negative.    Go to the emergency department if you develop shortness of breath, high fever, severe diarrhea, or other concerning symptoms.       ED Prescriptions    Medication Sig Dispense Auth. Provider   fluticasone (FLONASE) 50 MCG/ACT nasal spray Place 1 spray into both nostrils daily. 16 g Sharion Balloon, NP     Controlled Substance Prescriptions [place] Controlled Substance Registry consulted? Not Applicable   Sharion Balloon, NP 08/30/18 1145

## 2018-08-30 NOTE — Discharge Instructions (Signed)
Use the Flonase as prescribed.  Take Mucinex as needed for your congestion.  Your COVID test is pending.  You should self quarantine until your test result is back and is negative.    Go to the emergency department if you develop shortness of breath, high fever, severe diarrhea, or other concerning symptoms.

## 2018-09-01 ENCOUNTER — Telehealth (HOSPITAL_COMMUNITY): Payer: Self-pay | Admitting: Emergency Medicine

## 2018-09-01 ENCOUNTER — Encounter (HOSPITAL_COMMUNITY): Payer: Self-pay

## 2018-09-01 ENCOUNTER — Telehealth: Payer: Self-pay | Admitting: *Deleted

## 2018-09-01 LAB — NOVEL CORONAVIRUS, NAA (HOSP ORDER, SEND-OUT TO REF LAB; TAT 18-24 HRS): SARS-CoV-2, NAA: DETECTED — AB

## 2018-09-01 NOTE — Telephone Encounter (Signed)
Patient left a voicemail stateing that she went to [facility] Saturday and her covid test results just showed up in her mychart as detected. Patient stated that she needs to know how to proceed? Patient stated that she will also need a note for work.

## 2018-09-01 NOTE — Telephone Encounter (Signed)
Your test for COVID-19 was positive, meaning that you were infected with the novel coronavirus and could give the germ to others.  Please continue isolation at home, for at least 10 days since the start of your fever/cough/breathlessness and until you have had 24 hours without fever (without taking a fever reducer) and with symptoms improving. Please continue good preventive care measures, including:  frequent hand-washing, avoid touching your face, cover coughs/sneezes, stay out of crowds and keep a 6 foot distance from others.  Recheck or go to the nearest hospital ED tent for re-assessment if fever/cough/breathlessness return.  Patient contacted and made aware of    results, all questions answered,

## 2018-09-01 NOTE — Telephone Encounter (Signed)
plz call to review date of onset of symptoms as well as progression of symptoms. Recommend supportive care with fluids, rest, OTC cough medicine, and self isolation at home.  Letter in chart for patient.  plz submit report to health department.  plz call back in 2-3 days for symptom update  She may return to work once all below criteria have been meet: 1. At least 10 days since symptoms first appeared and 2. At least 24 hours with no fever without fever-reducing medication and 3. Symptoms have improved

## 2018-09-02 NOTE — Telephone Encounter (Addendum)
Spoke with pt relaying Dr. Synthia Innocent message.  Pt verbalizes understanding. States she had a sinus inf in 06/2018 but fever started 08/29/18 along with cough, nasal congestion and wheezing.   Faxed form to [place].

## 2018-09-09 ENCOUNTER — Encounter: Payer: Self-pay | Admitting: Family Medicine

## 2018-09-09 ENCOUNTER — Ambulatory Visit (INDEPENDENT_AMBULATORY_CARE_PROVIDER_SITE_OTHER): Payer: BC Managed Care – PPO | Admitting: Family Medicine

## 2018-09-09 DIAGNOSIS — U099 Post covid-19 condition, unspecified: Secondary | ICD-10-CM | POA: Insufficient documentation

## 2018-09-09 DIAGNOSIS — G9332 Myalgic encephalomyelitis/chronic fatigue syndrome: Secondary | ICD-10-CM

## 2018-09-09 DIAGNOSIS — J208 Acute bronchitis due to other specified organisms: Secondary | ICD-10-CM

## 2018-09-09 DIAGNOSIS — J40 Bronchitis, not specified as acute or chronic: Secondary | ICD-10-CM | POA: Insufficient documentation

## 2018-09-09 DIAGNOSIS — U071 COVID-19: Secondary | ICD-10-CM

## 2018-09-09 HISTORY — DX: Myalgic encephalomyelitis/chronic fatigue syndrome: G93.32

## 2018-09-09 MED ORDER — AZITHROMYCIN 250 MG PO TABS: ORAL_TABLET | ORAL | 0 refills | Status: DC

## 2018-09-09 MED ORDER — PREDNISONE 20 MG PO TABS: ORAL_TABLET | ORAL | 0 refills | Status: DC

## 2018-09-09 MED ORDER — ALBUTEROL SULFATE HFA 108 (90 BASE) MCG/ACT IN AERS: 2.0000 | INHALATION_SPRAY | Freq: Four times a day (QID) | RESPIRATORY_TRACT | 0 refills | Status: DC | PRN

## 2018-09-09 MED ORDER — BENZONATATE 200 MG PO CAPS: 200.0000 mg | ORAL_CAPSULE | Freq: Two times a day (BID) | ORAL | 0 refills | Status: DC | PRN

## 2018-09-09 NOTE — Assessment & Plan Note (Signed)
Will start pt on prednsione taper, benzonatate for cough and albuterol prn wheeze and shortness of breath.   health department felt she may have superimposed bacterial infection... hard to say.. she has has decline with more cough and SOB in last 48 hours... but symptomatically this is more consistent with  Covid19.  I will provide a course of antibitoics to hold onto and fill if not improving, but I cautioned pt if shortness of breath not improving .. she should have in person lung exam at Urgent care or if severe ... go to ER.

## 2018-09-09 NOTE — Patient Instructions (Signed)
Will start pt on prednsione taper, benzonatate for cough and albuterol prn wheeze and shortness of breath.   Health department felt she may have superimposed bacterial infection... hard to say.. she has has decline with more cough and SOB in last 48 hours... but symptomatically this is more consistent with  Covid19.  I will provide a course of antibitoics to hold onto and fill if not improving, but I cautioned pt if shortness of breath not improving .. she should have in person lung exam at Urgent care or if severe ... go to ER>

## 2018-09-09 NOTE — Progress Notes (Signed)
Lastacia placed on Respiratory Illness Log to check on Thursday 09/11/2018.

## 2018-09-09 NOTE — Progress Notes (Signed)
VIRTUAL VISIT Due to national recommendations of social distancing due to [place] 19, a virtual visit is felt to be most appropriate for this patient at this time.   I connected with the patient on 09/09/18 at  2:20 PM EDT by virtual telehealth platform and verified that I am speaking with the correct person using two identifiers.   I discussed the limitations, risks, security and privacy concerns of performing an evaluation and management service by  virtual telehealth platform and the availability of in person appointments. I also discussed with the patient that there may be a patient responsible charge related to this service. The patient expressed understanding and agreed to proceed.  Patient location: Home Provider Location: [facility] Participants: Eliezer Lofts and [name]   Chief Complaint  Patient presents with  . Fever    Positive for COVID last Monday-Was told by health department needed to be seen to get anitbiotic  . Cough  . Shortness of Breath  . Fatigue    History of Present Illness: Fever  This is a new problem. The current episode started 1 to 4 weeks ago (8/27, positive covid test returned on  8/29). Associated symptoms include coughing. Associated symptoms comments:  Started as sinus congestion, pressure, In last 2 days increase in cough and SOB, decreased energy level, low  100-100.4   SOB is with coughing fit and with exertion.. She has tried acetaminophen (mucinex ) for the symptoms.  Cough This is a new problem. The problem has been gradually worsening. The cough is non-productive. The symptoms are aggravated by lying down. Risk factors: former smoker. There is no history of emphysema or environmental allergies.   Health department folowing her flet she may have superimposed bacterial bronchtiits   no past chronic respiratory issues.   Cough is keeping her up at night.  COVID 19 screen No recent travel or known exposure to COVID19 The patient  denies respiratory symptoms of COVID 19 at this time.  The importance of social distancing was discussed today.   Review of Systems  Respiratory: Positive for cough.   Endo/Heme/Allergies: Negative for environmental allergies.      Past Medical History:  Diagnosis Date  . Allergy   . Asthma   . Cardiac disease   . Diffuse cystic mastopathy   . GERD (gastroesophageal reflux disease)   . Guttate psoriasis   . H/O syncope    cards work-up WNL (Hochrein) thought 2/2 overmedication vs arrhythmias  . Hx of endometriosis    hysterectomy  . Hx of migraines    occasional; improved since hysterectomy  . Labral tear of shoulder 02/2014   R, pending surgery Tamera Punt)  . Rosacea    mild    reports that she quit smoking about 11 years ago. She has never used smokeless tobacco. She reports current alcohol use. She reports that she does not use drugs.  No current outpatient medications on file.   Observations/Objective: Temperature 99.8 F (37.7 C), temperature source Oral, height 5' 4.5" (1.638 m), weight 190 lb (86.2 kg), last menstrual period 01/27/2000.  Physical Exam  Physical Exam Constitutional:      General: The patient is not in acute distress. She appears somewhat SOB woth speaking but mildly. Frequent coughing. Pulmonary:     Effort: Pulmonary effort is normal. No respiratory distress.  Neurological:     Mental Status: The patient is alert and oriented to person, place, and time.  Psychiatric:        Mood  and Affect: Mood normal.        Behavior: Behavior normal.   Assessment and Plan    I discussed the assessment and treatment plan with the patient. The patient was provided an opportunity to ask questions and all were answered. The patient agreed with the plan and demonstrated an understanding of the instructions.   The patient was advised to call back or seek an in-person evaluation if the symptoms worsen or if the condition fails to improve as anticipated.   Acute  bronchitis due to 2019 novel coronavirus  Will start pt on prednsione taper, benzonatate for cough and albuterol prn wheeze and shortness of breath.   health department felt she may have superimposed bacterial infection... hard to say.. she has has decline with more cough and SOB in last 48 hours... but symptomatically this is more consistent with  Covid19.  I will provide a course of antibitoics to hold onto and fill if not improving, but I cautioned pt if shortness of breath not improving .. she should have in person lung exam at Urgent care or if severe ... go to ER.    Eliezer Lofts, MD

## 2018-09-11 ENCOUNTER — Telehealth: Payer: Self-pay | Admitting: *Deleted

## 2018-09-11 DIAGNOSIS — J208 Acute bronchitis due to other specified organisms: Secondary | ICD-10-CM

## 2018-09-11 MED ORDER — AZITHROMYCIN 250 MG PO TABS: ORAL_TABLET | ORAL | 0 refills | Status: DC

## 2018-09-11 NOTE — Telephone Encounter (Signed)
Left message for Cassie [name] to return call to office.  She is on the Respiratory Illness Log for positive Covid 19.  I ask that she call back to give [place] an update on how she is doing.

## 2018-09-11 NOTE — Telephone Encounter (Signed)
[facility] stating [facility] needed [facility] new Rx for azithromycin sent over with Dx code.  Sent as requested.

## 2018-09-12 NOTE — Telephone Encounter (Signed)
Noted  

## 2018-09-12 NOTE — Telephone Encounter (Addendum)
Noted.  plz call for update on Monday.  Will route to [place].

## 2018-09-12 NOTE — Telephone Encounter (Addendum)
Spoke with Cassie [name].  She has had no improvement since her virtual visit with Dr. Diona Browner on 09/09/2018.  She still complains of SOB/Cough and states yesterday her chest felt like it was on fire but it is not as bad today and she is very fatigued.  She denies any fever.  When I ask about her taking the Azithromycin, she states she was not given an antibiotic at the pharmacy when they picked up her medication on the Tuesday.  She said they only give her the prednisone, benzonatate and the inhaler.  I advised her to send someone back to the pharmacy because they should have the antibiotic ready for her.  It was probably delayed due to pharmacy sent note back stating due to Covid that a diagnosis had to be associated to the azithromycin.   She will get Rx picked up today.  Patient also instructed that if she was to start experiencing severe SOB to go the the ER.  Patient states understanding.

## 2018-09-15 ENCOUNTER — Encounter (HOSPITAL_COMMUNITY): Payer: Self-pay

## 2018-09-15 ENCOUNTER — Other Ambulatory Visit: Payer: Self-pay

## 2018-09-15 ENCOUNTER — Emergency Department (HOSPITAL_COMMUNITY)
Admission: EM | Admit: 2018-09-15 | Discharge: 2018-09-15 | Disposition: A | Discharge status: Home / Self Care | Payer: BC Managed Care – PPO | Attending: Emergency Medicine | Admitting: Emergency Medicine

## 2018-09-15 ENCOUNTER — Emergency Department (HOSPITAL_COMMUNITY): Payer: BC Managed Care – PPO

## 2018-09-15 ENCOUNTER — Telehealth: Payer: Self-pay | Admitting: Surgery

## 2018-09-15 DIAGNOSIS — R509 Fever, unspecified: Secondary | ICD-10-CM | POA: Insufficient documentation

## 2018-09-15 DIAGNOSIS — R0789 Other chest pain: Secondary | ICD-10-CM | POA: Diagnosis not present

## 2018-09-15 DIAGNOSIS — Z87891 Personal history of nicotine dependence: Secondary | ICD-10-CM | POA: Insufficient documentation

## 2018-09-15 DIAGNOSIS — R0602 Shortness of breath: Secondary | ICD-10-CM | POA: Diagnosis not present

## 2018-09-15 DIAGNOSIS — R059 Cough, unspecified: Secondary | ICD-10-CM

## 2018-09-15 DIAGNOSIS — U071 COVID-19: Secondary | ICD-10-CM | POA: Insufficient documentation

## 2018-09-15 DIAGNOSIS — R05 Cough: Secondary | ICD-10-CM | POA: Insufficient documentation

## 2018-09-15 DIAGNOSIS — I2510 Atherosclerotic heart disease of native coronary artery without angina pectoris: Secondary | ICD-10-CM | POA: Diagnosis not present

## 2018-09-15 LAB — CBC WITH DIFFERENTIAL/PLATELET
Abs Immature Granulocytes: 0.06 10*3/uL (ref 0.00–0.07)
Basophils Absolute: 0 10*3/uL (ref 0.0–0.1)
Basophils Relative: 0 %
Eosinophils Absolute: 0 10*3/uL (ref 0.0–0.5)
Eosinophils Relative: 0 %
HCT: 40.8 % (ref 36.0–46.0)
Hemoglobin: 14.1 g/dL (ref 12.0–15.0)
Immature Granulocytes: 1 %
Lymphocytes Relative: 10 %
Lymphs Abs: 1.1 10*3/uL (ref 0.7–4.0)
MCH: 32.5 pg (ref 26.0–34.0)
MCHC: 34.6 g/dL (ref 30.0–36.0)
MCV: 94 fL (ref 80.0–100.0)
Monocytes Absolute: 0.5 10*3/uL (ref 0.1–1.0)
Monocytes Relative: 5 %
Neutro Abs: 9.6 10*3/uL — ABNORMAL HIGH (ref 1.7–7.7)
Neutrophils Relative %: 84 %
Platelets: 237 10*3/uL (ref 150–400)
RBC: 4.34 MIL/uL (ref 3.87–5.11)
RDW: 11.8 % (ref 11.5–15.5)
WBC: 11.3 10*3/uL — ABNORMAL HIGH (ref 4.0–10.5)
nRBC: 0 % (ref 0.0–0.2)

## 2018-09-15 LAB — BASIC METABOLIC PANEL
Anion gap: 13 (ref 5–15)
BUN: 15 mg/dL (ref 6–20)
CO2: 21 mmol/L — ABNORMAL LOW (ref 22–32)
Calcium: 9.1 mg/dL (ref 8.9–10.3)
Chloride: 105 mmol/L (ref 98–111)
Creatinine, Ser: 1.02 mg/dL — ABNORMAL HIGH (ref 0.44–1.00)
GFR calc Af Amer: >60 mL/min (ref >60)
GFR calc non Af Amer: >60 mL/min (ref >60)
Glucose, Bld: 126 mg/dL — ABNORMAL HIGH (ref 70–99)
Potassium: 3.7 mmol/L (ref 3.5–5.1)
Sodium: 139 mmol/L (ref 135–145)

## 2018-09-15 MED ORDER — HYDROCODONE-CHLORPHENIRAMINE 5-4 MG/5ML PO SOLN: 5.0000 mL | Freq: Four times a day (QID) | ORAL | 0 refills | Status: DC | PRN

## 2018-09-15 MED ORDER — SODIUM CHLORIDE 0.9 % IV BOLUS
1000.0000 mL | Freq: Once | INTRAVENOUS | Status: AC
  Administered 2018-09-15: 1000 mL via INTRAVENOUS

## 2018-09-15 MED ORDER — DEXAMETHASONE SODIUM PHOSPHATE 10 MG/ML IJ SOLN
10.0000 mg | Freq: Once | INTRAMUSCULAR | Status: AC
  Administered 2018-09-15: 10 mg via INTRAVENOUS
  Filled 2018-09-15: qty 1

## 2018-09-15 MED ORDER — ACETAMINOPHEN 325 MG PO TABS
650.0000 mg | ORAL_TABLET | Freq: Once | ORAL | Status: AC
  Administered 2018-09-15: 650 mg via ORAL
  Filled 2018-09-15: qty 2

## 2018-09-15 NOTE — ED Notes (Signed)
Pt dc'd home with all belongings, ambulated to wheel chair, drove self home

## 2018-09-15 NOTE — Telephone Encounter (Signed)
ED CM received call concerning pharmacy not having medication at hand. Medication available at [facility] in [place]. CM will forward this message to the EDP To follow up.

## 2018-09-15 NOTE — ED Notes (Signed)
Pt ambulated in room, sats 98% on RA while ambulating.

## 2018-09-15 NOTE — ED Provider Notes (Signed)
[facility] EMERGENCY DEPARTMENT Provider Note   CSN: [id number] Arrival date & time: 09/15/18  1250     History   Chief Complaint Chief Complaint  Patient presents with  . Cough  . Shortness of Breath    HPI Cassie [name] is a 48 y.o. female with past medical history as listed below presents emergency department today with cough and shortness of breath.  Patient tested positive for COVID on 08/30/2018.  Today she is reporting symptoms have worsened over the 1 week. She reports cough is non productive. She has associated chest pain after coughing episodes. She is reporting burning sensation in her lungs.  She states T-max of 100.4 x3 days ago, last dose of tylenol was yesterday. She finished azithromycin today. She has two more days of steroid taper. She has also been using albuterol inhaler when feeling short of breath, last used 3 hours prior to arrival.  She denies abdominal pain, nausea, vomiting, urinary symptoms, diarrhea. History provided by patient with additional history obtained from chart review.      Past Medical History:  Diagnosis Date  . Allergy   . Asthma   . Cardiac disease   . Diffuse cystic mastopathy   . GERD (gastroesophageal reflux disease)   . Guttate psoriasis   . H/O syncope    cards work-up WNL (Hochrein) thought 2/2 overmedication vs arrhythmias  . Hx of endometriosis    hysterectomy  . Hx of migraines    occasional; improved since hysterectomy  . Labral tear of shoulder 02/2014   R, pending surgery Tamera Punt)  . Rosacea    mild    Patient Active Problem List   Diagnosis Date Noted  . Acute bronchitis due to 2019 novel coronavirus 09/09/2018  . Frontal headache 07/14/2018  . Vitamin D deficiency 12/12/2017  . Floaters, bilateral 11/08/2017  . Pre-syncope 11/08/2017  . Stressful life events affecting family and household 11/08/2017  . Chest pain at rest 02/09/2016  . Extrinsic asthma 01/20/2016  . Labral tear of shoulder  02/01/2014  . Bowel habit changes 04/28/2013  . Obesity, Class I, BMI 30-34.9 01/09/2013  . Diffuse cystic mastopathy 08/18/2012  . Family history of breast cancer 08/18/2012  . Healthcare maintenance 02/27/2011  . Family history of melanoma 02/27/2011  . SYNCOPE, HX OF 08/11/2009  . BREAST MASS, HX OF 08/11/2009    Past Surgical History:  Procedure Laterality Date  . BREAST BIOPSY  2010   left; core biopsy  . BREAST EXCISIONAL BIOPSY Bilateral    multiple bilateral  . BREAST MASS EXCISION  2008  . BREAST SURGERY Right    excision of lesion  . CARDIAC CATHETERIZATION  2007  . CHOLECYSTECTOMY  2008  . COLONOSCOPY  2006   WNL Nicolasa Ducking)  . COLONOSCOPY  06/2013   mod diverticulosis, rpt 10 yrs Henrene Pastor)  . LAPAROSCOPY  1994   endometriosis  . TONSILLECTOMY  1994  . TONSILLECTOMY AND ADENOIDECTOMY  1995  . TOTAL ABDOMINAL HYSTERECTOMY     Dr Rayford Halsted for endometriosis     OB History    Gravida  3   Para  2   Term  2   Preterm      AB  1   Living  2     SAB  1   TAB      Ectopic      Multiple      Live Births  2        Obstetric Comments  1st  Menstrual Cycle: 13 1st Pregnancy:  23         Home Medications    Prior to Admission medications   Medication Sig Start Date End Date Taking? Authorizing Provider  albuterol (VENTOLIN HFA) 108 (90 Base) MCG/ACT inhaler Inhale 2 puffs into the lungs every 6 (six) hours as needed for wheezing or shortness of breath. 09/09/18   Bedsole, Amy E, MD  azithromycin (ZITHROMAX) 250 MG tablet 2 tab po x 1 day then 1 tab po daily 09/11/18   Bedsole, Amy E, MD  benzonatate (TESSALON) 200 MG capsule Take 1 capsule (200 mg total) by mouth 2 (two) times daily as needed for cough. 09/09/18   Bedsole, Amy E, MD  HYDROcodone-Chlorpheniramine 5-4 MG/5ML SOLN Take 5 mLs by mouth every 6 (six) hours as needed for up to 5 days. 09/15/18 09/20/18  Shauna Bodkins E, PA-C  predniSONE (DELTASONE) 20 MG tablet 3 tabs by mouth daily x 3 days,  then 2 tabs by mouth daily x 2 days then 1 tab by mouth daily x 2 days 09/09/18   Jinny Sanders, MD    Family History Family History  Problem Relation Age of Onset  . Skin cancer Mother        melanoma  . Hypertension Mother        also had polio  . Irritable bowel syndrome Mother   . Breast cancer Mother 45  . Coronary artery disease Father        age 31's  . Hypertension Father   . Breast cancer Maternal Grandmother 43  . Stroke Maternal Grandmother   . Other Son        hyperinsulinemic  . Coronary artery disease Paternal Uncle        age 47's  . Skin cancer Sister 52       melanoma - cancer in eye and uterus  . Thyroid disease Sister   . Breast cancer Other        pat. great aunt  . Irritable bowel syndrome Daughter   . Diabetes Maternal Grandfather   . Heart disease Maternal Grandfather   . Diabetes Paternal Uncle   . Epilepsy Son   . Breast cancer Cousin        second cousin  . Pulmonary embolism Other        died from this after MVA  . Heart disease Paternal Grandmother   . Heart disease Paternal Grandfather   . Colon cancer Neg Hx     Social History Social History   Tobacco Use  . Smoking status: Former Smoker    Quit date: 02/21/2007    Years since quitting: 11.5  . Smokeless tobacco: Never Used  Substance Use Topics  . Alcohol use: Yes    Alcohol/week: 0.0 standard drinks    Comment: occas  . Drug use: No     Allergies   Fish allergy, Shellfish allergy, Chocolate, Penicillins, and Tape   Review of Systems Review of Systems  Constitutional: Positive for fever. Negative for chills.  HENT: Positive for sore throat. Negative for congestion, ear discharge, ear pain, facial swelling, sinus pressure, sinus pain, trouble swallowing and voice change.   Eyes: Negative for pain and redness.  Respiratory: Positive for cough, shortness of breath and wheezing. Negative for chest tightness and stridor.   Cardiovascular: Negative for chest pain.   Gastrointestinal: Negative for abdominal pain, constipation, diarrhea, nausea and vomiting.  Genitourinary: Negative for dysuria and hematuria.  Musculoskeletal: Positive for arthralgias. Negative  for back pain and neck pain.  Skin: Negative for wound.  Neurological: Negative for weakness, numbness and headaches.     Physical Exam Updated Vital Signs BP 137/85   Pulse 84   Temp 98.2 F (36.8 C) (Oral)   Resp 16   Ht 5\' 4"  (1.626 m)   Wt 86.2 kg   LMP 01/27/2000   SpO2 97%   BMI 32.61 kg/m   Physical Exam Vitals signs and nursing note reviewed.  Constitutional:      General: She is not in acute distress.    Appearance: She is not ill-appearing or toxic-appearing.  HENT:     Head: Normocephalic and atraumatic.     Right Ear: Tympanic membrane and external ear normal.     Left Ear: Tympanic membrane and external ear normal.     Nose: Nose normal.     Mouth/Throat:     Mouth: Mucous membranes are moist.     Pharynx: Oropharynx is clear.     Comments: No erythema to oropharynx, no edema, no exudate, no tonsillar swelling, voice normal, neck supple without lymphadenopathy  Eyes:     General: No scleral icterus.       Right eye: No discharge.        Left eye: No discharge.     Extraocular Movements: Extraocular movements intact.     Conjunctiva/sclera: Conjunctivae normal.     Pupils: Pupils are equal, round, and reactive to light.  Neck:     Musculoskeletal: Normal range of motion.     Vascular: No JVD.  Cardiovascular:     Rate and Rhythm: Normal rate and regular rhythm.     Pulses: Normal pulses.          Radial pulses are 2+ on the right side and 2+ on the left side.     Heart sounds: Normal heart sounds.  Pulmonary:     Comments: Lung sounds diminished throughout.  Inspiratory wheeze heard in bilateral upper fields.  She is speaking in full sentences.  SpO2 is 97% on room air.  Normal work of breathing.  No rales or rhonchi. Abdominal:     Comments: Abdomen is  soft, non-distended, and non-tender in all quadrants. No rigidity, no guarding. No peritoneal signs.  Musculoskeletal: Normal range of motion.  Skin:    General: Skin is warm and dry.     Capillary Refill: Capillary refill takes less than 2 seconds.  Neurological:     Mental Status: She is oriented to person, place, and time.     GCS: GCS eye subscore is 4. GCS verbal subscore is 5. GCS motor subscore is 6.     Comments: Fluent speech, no facial droop.  Psychiatric:        Behavior: Behavior normal.      ED Treatments / Results  Labs (all labs ordered are listed, but only abnormal results are displayed) Labs Reviewed  CBC WITH DIFFERENTIAL/PLATELET - Abnormal; Notable for the following components:      Result Value   WBC 11.3 (*)    Neutro Abs 9.6 (*)    All other components within normal limits  BASIC METABOLIC PANEL - Abnormal; Notable for the following components:   CO2 21 (*)    Glucose, Bld 126 (*)    Creatinine, Ser 1.02 (*)    All other components within normal limits    EKG None  Radiology Dg Chest Portable 1 View  Result Date: 09/15/2018 CLINICAL DATA:  Shortness of breath, cough.  Positive for COVID-19. EXAM: PORTABLE CHEST 1 VIEW COMPARISON:  Radiographs of February 09, 2016. FINDINGS: The heart size and mediastinal contours are within normal limits. Both lungs are clear. No pneumothorax or pleural effusion is noted. The visualized skeletal structures are unremarkable. IMPRESSION: No active disease. Electronically Signed   By: Marijo Conception M.D.   On: 09/15/2018 14:25    Procedures Procedures (including critical care time)  Medications Ordered in ED Medications  acetaminophen (TYLENOL) tablet 650 mg (650 mg Oral Given 09/15/18 1342)  sodium chloride 0.9 % bolus 1,000 mL (0 mLs Intravenous Stopped 09/15/18 1538)  dexamethasone (DECADRON) injection 10 mg (10 mg Intravenous Given 09/15/18 1556)     Initial Impression / Assessment and Plan / ED Course  I have  reviewed the triage vital signs and the nursing notes.  Pertinent labs & imaging results that were available during my care of the patient were reviewed by me and considered in my medical decision making (see chart for details).  Cassie [name] was evaluated in Emergency Department on 09/15/2018 for the symptoms described in the history of present illness. She was evaluated in the context of the global COVID-19 pandemic, which necessitated consideration that the patient might be at risk for infection with the SARS-CoV-2 virus that causes COVID-19. Institutional protocols and algorithms that pertain to the evaluation of patients at risk for COVID-19 are in a state of rapid change based on information released by regulatory bodies including the CDC and federal and state organizations. These policies and algorithms were followed during the patient's care in the ED.   Patient seen and examined.  Patient looks to not feel well however is nontoxic in appearance she is in no acute distress. Lung sounds are diminished throughout, however has normal work of breathing, no hypoxia or tachypnea.  Labs today show leukocytosis of 11.3.  BMP unremarkable.  Chest x-ray viewed by me is without signs of infiltrate, underlying pneumonia unlikely.  She is already completed a Z-Pak and is finishing steroid taper as well as having albuterol inhaler. I have reviewed the PDMP during this encounter.  Patient has no recent narcotic prescriptions, will prescribe hydrocodone cough medicine she has already tried [employer] without much relief.  She ambulated in the exam room, without hypoxia or distress, SpO2 stayed above 98%.  Discussed at length the importance of symptomatic treatment The patient appears reasonably screened and/or stabilized for discharge and I doubt any other medical condition or other [facility] requiring further screening, evaluation, or treatment in the ED at this time prior to discharge. The patient is safe for  discharge with strict return precautions discussed. Findings and plan of care discussed with supervising physician Dr. Zenia Resides.  Portions of this note were generated with [employer]. Dictation errors may occur despite best attempts at proofreading.    Final Clinical Impressions(s) / ED Diagnoses   Final diagnoses:  COVID-19 virus infection  Cough    ED Discharge Orders         Ordered    HYDROcodone-Chlorpheniramine 5-4 MG/5ML SOLN  Every 6 hours PRN     09/15/18 1536           Cintia Gleed, Harley Hallmark, PA-C 09/15/18 1618    Lacretia Leigh, MD 09/16/18 [phone]

## 2018-09-15 NOTE — Telephone Encounter (Signed)
Pt left v/m that she is feeling worse; I spoke with pt; pt is feeling worse and has SOB and lungs are burning; pt is not coughing as much but feeling worse and when coughs at end of cough feels like it takes her breath.No fever. Pt has one more day of abx but has finished the prednisone. Someone in pts family will take pt to [facility] ED now; pt will wear mask and pt will call [place] to let them know she is on her way to ED. FYI to Dr Darnell Level.

## 2018-09-15 NOTE — ED Triage Notes (Signed)
Pt presents with cough and SOB x2.5 weeks. Pt reports lungs burning x1 week. States when she coughs she feels like shes going to pass out and feels like shes gasping for air while coughing.  Pt is positive for covid. Today she finished her prednisone and has 1 more day of her Z-pack

## 2018-09-15 NOTE — Discharge Instructions (Addendum)
You have been seen today for cough. Please read and follow all provided instructions. Return to the emergency room for worsening condition or new concerning symptoms.    1. Medications:  Prescription sent to your pharmacy for a strong cough medicine. This medicine contains a narcotic so it is important you do not drive or work while taking this medicine. -Continue symptomatic care as we discussed: warm drinks such as tea, OTC cough medicine, vicks vapor rub. Tylenol as needed for fever and body aches.  Continue usual home medications  Take medications as prescribed. Please review all of the medicines and only take them if you do not have an allergy to them.   2. Treatment: rest, drink plenty of fluids  3. Follow Up: Please follow up with your primary doctor in 1 week if needed   It is also a possibility that you have an allergic reaction to any of the medicines that you have been prescribed - Everybody reacts differently to medications and while MOST people have no trouble with most medicines, you may have a reaction such as nausea, vomiting, rash, swelling, shortness of breath. If this is the case, please stop taking the medicine immediately and contact your physician.  ?

## 2018-09-16 ENCOUNTER — Telehealth (HOSPITAL_COMMUNITY): Payer: Self-pay | Admitting: Physician Assistant

## 2018-09-16 ENCOUNTER — Telehealth: Payer: Self-pay | Admitting: *Deleted

## 2018-09-16 MED ORDER — HYDROCOD POLST-CPM POLST ER 10-8 MG/5ML PO SUER: 5.0000 mL | Freq: Two times a day (BID) | ORAL | 0 refills | Status: DC

## 2018-09-16 MED ORDER — HYDROCODONE-CHLORPHENIRAMINE 5-4 MG/5ML PO SOLN: 5.0000 mL | Freq: Four times a day (QID) | ORAL | 0 refills | Status: DC | PRN

## 2018-09-16 NOTE — Telephone Encounter (Signed)
Notified by Laurena Slimmer RN via epic message at 2013 on 09/15/2018 that pt called to request prescription be changed to [place] [facility] [place] [facility]. [facility] and [facility] to requested [facility]

## 2018-09-16 NOTE — Telephone Encounter (Addendum)
Covid+ pt. Pt seen at ER maintaining O2 sats, treated with hydrocodone cough syrup and sent home. plz call tomorrow for update on symptoms.

## 2018-09-16 NOTE — ED Notes (Signed)
Notified again by [facility] pharmacist requesting prescription to be changed to Tussionex as that is what they have in stock. Prescription changed and previous cancelled by pharmacist. Pharmacist will notify pt when prescription is ready.   Cherre Robins, [place] 09/16/18 1435

## 2018-09-16 NOTE — Telephone Encounter (Signed)
Pharmacy called related to Rx: HYDROcodone-Chlorpheniramine 5-4 MG/5ML SOLN not being in stock .Marland KitchenMarland KitchenEDCM clarified with EDP (Albrizze) to change Rx to: chlorpheniramine-HYDROcodone (TUSSIONEX PENNKINETIC ER) 10-8 MG/5ML SURE.  Albizze e-scribed new Rx.  Deshon Hsiao J. Clydene Laming, [place], [place], [place]

## 2018-09-16 NOTE — Telephone Encounter (Signed)
Spoke with pt.  Says she is fatigued, cough, wheezing and hoarseness.  Says her lungs feel like they are on fire when she coughs.

## 2018-09-18 NOTE — Addendum Note (Signed)
Addended by: Ria Bush on: 09/18/2018 09:37 AM   Modules accepted: Orders

## 2018-09-18 NOTE — Telephone Encounter (Signed)
Spoke with patient. Reviewed ER records.  Ongoing coughing fits, dyspnea, chest burning with cough. She feels dyspnea may be slightly better today.  Tried tessalon perls.  Has been unable to fill hydrocodone cough syrup due to shortage at pharmacy - will touch base with pharmacy to see what options are available. Pharmacy says tussionex is available for her #21mL.  Was supposed to return to work on 9/8 - requests new note for work. Letter will be provided for her.  Will call again in 2-3 days for another update.

## 2018-09-22 NOTE — Telephone Encounter (Signed)
Spoke with pt relaying Dr. G's message.  Pt verbalizes understanding and expresses her thanks.  

## 2018-09-22 NOTE — Telephone Encounter (Signed)
Please call today for an update on symptoms for Covid patient.  If doing better, see when ready to return to work as will likely need return to work note.

## 2018-09-22 NOTE — Telephone Encounter (Signed)
Spoke with asking how she is doing.  Pt is still very hoarse.  Says she's feeling better despite still having cough and SOB, which has improved.  Says her work note runs out today.  She will try to work from home this week.  But she is not feel ready to start traveling for work again just yet.

## 2018-09-22 NOTE — Telephone Encounter (Signed)
Ok. Glad to hear some improvement.  Have her let us know what she needs from [place] regarding work note.

## 2018-09-28 ENCOUNTER — Telehealth: Payer: Self-pay | Admitting: Family Medicine

## 2018-09-28 NOTE — Telephone Encounter (Signed)
plz call Monday for update from Covid patient. Last week she was slowly recovering, planned to work from home last week. Ensure continues improving.

## 2018-09-29 NOTE — Telephone Encounter (Signed)
Noted! Thank you

## 2018-09-29 NOTE — Telephone Encounter (Signed)
Spoke with patient. Doing somewhat better. Voice is still hoarse. Coughing still but not loosing her breath as much asshe was. Dry cough. Bodyaches and fatigue present. No fever. She feels very much better from how she was at the beginning with her symptoms. From her normal well being state she is about 50% better. She is working from home. Health department has patient on quarantine until September 29th. They will be calling her tomorrow to follow up and see if they need to extend quarantine period.

## 2018-10-01 ENCOUNTER — Other Ambulatory Visit: Payer: Self-pay | Admitting: Family Medicine

## 2018-10-02 ENCOUNTER — Telehealth: Payer: Self-pay | Admitting: Family Medicine

## 2018-10-02 MED ORDER — BENZONATATE 200 MG PO CAPS: 200.0000 mg | ORAL_CAPSULE | Freq: Two times a day (BID) | ORAL | 0 refills | Status: DC | PRN

## 2018-10-02 MED ORDER — HYDROCOD POLST-CPM POLST ER 10-8 MG/5ML PO SUER: 5.0000 mL | Freq: Two times a day (BID) | ORAL | 0 refills | Status: DC | PRN

## 2018-10-02 NOTE — Telephone Encounter (Signed)
Spoke with pt relaying Dr. Synthia Innocent message.  Pt verbalizes understanding.  Agrees to virtual visit, however, Dr. Darnell Level does not have anything open.  Where do you want to squeeze her in?

## 2018-10-02 NOTE — Telephone Encounter (Signed)
plz place at 12:30pm thanks

## 2018-10-02 NOTE — Telephone Encounter (Addendum)
We can see an extended respiratory illness/symptoms after covid19 illness.  I have refilled tessalon perls, sent in new hydrocodone cough syrup (tussionex) to take twice daily.  plz offer virtual visit tomorrow to review symptoms vs have her return to [facility] for recheck.

## 2018-10-02 NOTE — Addendum Note (Signed)
Addended by: Ria Bush on: 10/02/2018 02:21 PM   Modules accepted: Orders

## 2018-10-02 NOTE — Telephone Encounter (Signed)
Pt calling stating that she is not having any improvement in her symptoms. Positive Covid test Increased cough  And hoarseness. Sore throat is getting better. No fever.  Pt is out of cough meds - Tessalon Perles and Hydrocodone-Chlorpheniramine Syrup. Pt states that she Is not getting better as she feels she needs to be and wants to know what to do next.   Please advise. Thanks.

## 2018-10-03 ENCOUNTER — Encounter: Payer: Self-pay | Admitting: Family Medicine

## 2018-10-03 ENCOUNTER — Ambulatory Visit (INDEPENDENT_AMBULATORY_CARE_PROVIDER_SITE_OTHER): Payer: BC Managed Care – PPO | Admitting: Family Medicine

## 2018-10-03 VITALS — Temp 98.2°F | Ht 64.0 in | Wt 190.0 lb

## 2018-10-03 DIAGNOSIS — J4521 Mild intermittent asthma with (acute) exacerbation: Secondary | ICD-10-CM

## 2018-10-03 DIAGNOSIS — U071 COVID-19: Secondary | ICD-10-CM | POA: Diagnosis not present

## 2018-10-03 DIAGNOSIS — J40 Bronchitis, not specified as acute or chronic: Secondary | ICD-10-CM

## 2018-10-03 MED ORDER — PREDNISONE 20 MG PO TABS: ORAL_TABLET | ORAL | 0 refills | Status: DC

## 2018-10-03 NOTE — Progress Notes (Signed)
Virtual visit completed through [employer]. Due to national recommendations of social distancing due to COVID-19, a virtual visit is felt to be most appropriate for this patient at this time. Reviewed limitations of a virtual visit.   Patient location: home Provider location: [place] at [facility], office If any vitals were documented, they were collected by patient at home unless specified below.    Temp 98.2 F (36.8 C)   Ht 5\' 4"  (1.626 m)   Wt 190 lb (86.2 kg)   LMP 01/27/2000   BMI 32.61 kg/m    CC: ongoing trouble with Covid Subjective:    Patient ID: Cassie [name], female    DOB: 09-Jan-1970, 48 y.o.   MRN: [id number]  HPI: Cassie [name] is a 48 y.o. female presenting on 10/03/2018 for Cough (Pt still has cough, hoarseness and sore throat.  Has had sxs about 2 mos. )   Prolonged symptoms from Covid19 infection. Tested positive 08/28/2018. Started as sinus congestion, pressure, low grade fever, progressed to cough with dyspnea when coughing fits or exertion. She has been seen in ER as well as at our office for these symptoms. Seen 09/09/2018 with concern for bronchitis (?covid vs bacterial) treated with prednisone taper and azithromycin course.   Has used mucinex, tylenol, hydrocodone cough syrup, and tessalon perls.  She has also been using albuterol inhaler once daily in h/o asthma.   Ongoing coughing fits - ongoing dry cough - with exertion and worse in the evenings, persistent dyspnea with activity (ie walking to mailbox), persistent hoarseness, persistent ST from cough/hoarseness.  Burning in lungs has improved. Sinus pressure and HA improved. Ongoing loose stools. No fever.  No loss of taste/smell.      Relevant past medical, surgical, family and social history reviewed and updated as indicated. Interim medical history since our last visit reviewed. Allergies and medications reviewed and updated. Outpatient Medications Prior to Visit  Medication Sig Dispense Refill   . albuterol (VENTOLIN HFA) 108 (90 Base) MCG/ACT inhaler TAKE 2 PUFFS BY MOUTH EVERY 6 HOURS AS NEEDED FOR WHEEZE OR SHORTNESS OF BREATH 6.7 g 0  . benzonatate (TESSALON) 200 MG capsule Take 1 capsule (200 mg total) by mouth 2 (two) times daily as needed for cough. 20 capsule 0  . chlorpheniramine-HYDROcodone (TUSSIONEX PENNKINETIC ER) 10-8 MG/5ML SUER Take 5 mLs by mouth 2 (two) times daily as needed for cough (sedation precautions). 180 mL 0  . azithromycin (ZITHROMAX) 250 MG tablet 2 tab po x 1 day then 1 tab po daily 6 tablet 0  . predniSONE (DELTASONE) 20 MG tablet 3 tabs by mouth daily x 3 days, then 2 tabs by mouth daily x 2 days then 1 tab by mouth daily x 2 days 15 tablet 0   No facility-administered medications prior to visit.      Per HPI unless specifically indicated in ROS section below Review of Systems Objective:    Temp 98.2 F (36.8 C)   Ht 5\' 4"  (1.626 m)   Wt 190 lb (86.2 kg)   LMP 01/27/2000   BMI 32.61 kg/m   Wt Readings from Last 3 Encounters:  10/03/18 190 lb (86.2 kg)  09/15/18 190 lb (86.2 kg)  09/09/18 190 lb (86.2 kg)     Physical exam: Gen: alert, NAD, not ill appearing HEENT: hoarseness present Pulm: speaks in complete sentences, dry cough present Psych: normal mood, normal thought content      Assessment & Plan:   Problem List Items Addressed This  Visit    Extrinsic asthma    Continues albuterol. Add prednisone taper. Consider adding ICS after she completes prednisone taper.       Relevant Medications   predniSONE (DELTASONE) 20 MG tablet   Bronchitis due to COVID-19 virus - Primary    Prolonged illness, entering 6th week of illness. No red flags for hypoxia or respiratory failure.  Reviewed supportive measures. In asthma hx, will prescribe another prednisone taper to start today. Continue tessalon, start tussionex pennkinetic mainly for night time coughing fits. Will write for another week to work from home, she will contact us next week  with status update.  Discussed possibly adding ICS after prednisone taper.           Meds ordered this encounter  Medications  . predniSONE (DELTASONE) 20 MG tablet    Sig: Take two tablets daily for 3 days followed by one tablet daily for 4 days    Dispense:  10 tablet    Refill:  0   No orders of the defined types were placed in this encounter.   I discussed the assessment and treatment plan with the patient. The patient was provided an opportunity to ask questions and all were answered. The patient agreed with the plan and demonstrated an understanding of the instructions. The patient was advised to call back or seek an in-person evaluation if the symptoms worsen or if the condition fails to improve as anticipated.  Follow up plan: Return if symptoms worsen or fail to improve.  Cassie Bush, MD

## 2018-10-03 NOTE — Assessment & Plan Note (Signed)
Prolonged illness, entering 6th week of illness. No red flags for hypoxia or respiratory failure.  Reviewed supportive measures. In asthma hx, will prescribe another prednisone taper to start today. Continue tessalon, start tussionex pennkinetic mainly for night time coughing fits. Will write for another week to work from home, she will contact us next week with status update.  Discussed possibly adding ICS after prednisone taper.

## 2018-10-03 NOTE — Telephone Encounter (Signed)
Noted. Pt scheduled

## 2018-10-03 NOTE — Assessment & Plan Note (Addendum)
Continues albuterol. Add prednisone taper. Consider adding ICS after she completes prednisone taper.

## 2018-10-08 ENCOUNTER — Other Ambulatory Visit: Payer: Self-pay

## 2018-10-08 ENCOUNTER — Telehealth: Payer: Self-pay | Admitting: *Deleted

## 2018-10-08 DIAGNOSIS — Z1231 Encounter for screening mammogram for malignant neoplasm of breast: Secondary | ICD-10-CM

## 2018-10-08 NOTE — Telephone Encounter (Signed)
Patient called and wants [place] to order her a mammogram at [facility] she was due in September, she said for appointment and mammogram she likes 1st thing Monday morning or latest appointment Thursay afternoon but Fridays are the BEST. Please call and let patient know.

## 2018-10-08 NOTE — Telephone Encounter (Signed)
Mammogram has been ordered. The patient will call and schedule this and then call us back to schedule her follow up with Dr Hampton Abbot.

## 2018-10-16 ENCOUNTER — Telehealth: Payer: Self-pay

## 2018-10-16 DIAGNOSIS — J40 Bronchitis, not specified as acute or chronic: Secondary | ICD-10-CM

## 2018-10-16 DIAGNOSIS — U071 COVID-19: Secondary | ICD-10-CM

## 2018-10-16 NOTE — Telephone Encounter (Signed)
Dr. Darnell Level - I contacted patient and asked the questions per your previous note.  Patient states she started back to work this week - and she has noted a lot of shortness of breath upon exertion. She states that just walking to her car in the mornings makes her feel as if she has ran a marathon. She states no leg swelling, and that she can breath better when sleeping on her side or her stomach.  She states that the Tussionex and Prednisone only helped some - and states she still had coughing spells very often, and had times where it felt that she could not catch her breath.  It was noticeable in her voice during my phone call with her that she was short of breath, and very hoarse.  Patient states that she would be open to seeing you sometime next week and would be willing to be retested for covid. Would you like me to get her scheduled for an in office visit? Thanks!

## 2018-10-16 NOTE — Telephone Encounter (Signed)
Post-covid symptoms, tested positive 08/28/2018.  How is exertional shortness of breath? If ongoing dyspnea, any leg swelling or trouble breathing when laying flat on back? Does tussionex help cough? Did prednisone help while she was on this?  Would offer trial of inhaled steroid/LABA combo like advair or symbicort to help with possible ongoing lung inflammation.  May need further evaluation with in-person office visit - if so would want to retest prior to bringing into office, maybe next week.

## 2018-10-16 NOTE — Telephone Encounter (Signed)
Pt said this is 7 weeks that pt has been sick; virtual appt on 10/03/18; pt has finished the prednisone and tessalon; pt still has tussionex; pt has no fever but is very hoarse, still dry cough with wheezing at times; pt has Some SOB after a coughing episode and wheezing but albuterol inhaler helps some. Pt is some better with tiredness but pt still has no energy and has weakness. Pt has occasional diarrhea with last diarrhea earlier this wk. Pt quarantine was lifted 10/02/18. Pt is very hoarse but does not have S/T. Pt wants to know what the next step is. Pt request cb.ED precautions given and pt voiced understanding.

## 2018-10-17 ENCOUNTER — Other Ambulatory Visit: Payer: Self-pay

## 2018-10-17 DIAGNOSIS — Z20828 Contact with and (suspected) exposure to other viral communicable diseases: Secondary | ICD-10-CM | POA: Diagnosis not present

## 2018-10-17 DIAGNOSIS — Z20822 Contact with and (suspected) exposure to covid-19: Secondary | ICD-10-CM

## 2018-10-17 MED ORDER — BENZONATATE 200 MG PO CAPS: 200.0000 mg | ORAL_CAPSULE | Freq: Two times a day (BID) | ORAL | 0 refills | Status: DC | PRN

## 2018-10-17 MED ORDER — HYDROCOD POLST-CPM POLST ER 10-8 MG/5ML PO SUER: 5.0000 mL | Freq: Two times a day (BID) | ORAL | 0 refills | Status: DC | PRN

## 2018-10-17 MED ORDER — ALBUTEROL SULFATE HFA 108 (90 BASE) MCG/ACT IN AERS: INHALATION_SPRAY | RESPIRATORY_TRACT | 1 refills | Status: DC

## 2018-10-17 NOTE — Addendum Note (Signed)
Addended by: Ria Bush on: 10/17/2018 10:27 AM   Modules accepted: Orders

## 2018-10-17 NOTE — Telephone Encounter (Signed)
Spoke with pt relaying Dr. Synthia Innocent message.  Pt verbalizes understanding and will go for COVID test today.  She's requesting albuterol inhaler be sent to [facility].    Also, pt is planning to travel for work 10/12- 10/15 so she would need to schedule OV later.

## 2018-10-17 NOTE — Telephone Encounter (Signed)
Will refill tussionex and tessalon - fill whichever she feels is more effective.

## 2018-10-17 NOTE — Telephone Encounter (Signed)
Spoke with pt relaying Dr. G's message. Pt verbalizes understanding.  

## 2018-10-17 NOTE — Addendum Note (Signed)
Addended by: Ria Bush on: 10/17/2018 04:21 PM   Modules accepted: Orders

## 2018-10-17 NOTE — Addendum Note (Signed)
Addended by: Ria Bush on: 10/17/2018 07:20 AM   Modules accepted: Orders

## 2018-10-17 NOTE — Addendum Note (Signed)
Addended by: Alan Mulder on: 10/17/2018 10:51 AM   Modules accepted: Orders

## 2018-10-17 NOTE — Telephone Encounter (Signed)
Spoke with pt about OV.  She wants to schedule on 10/24/18 at 4:00.  Pt is aware that she needs to come to back of bldg and call upon arrival.   Also, pt is asking what to take for the cough.  Pls advise via MyChart.

## 2018-10-17 NOTE — Telephone Encounter (Addendum)
Covid test ordered. plz have her go today to be tested.  Let's schedule her for an open slot at end of day next week, may place Wednesday 4:30pm.  Will want EKG and CXR on patient, and possibly labs.  Will cc Mandy to help coordinate PPE for the day.

## 2018-10-17 NOTE — Telephone Encounter (Signed)
Ok. plz schedule at last visit of day when she is next available.

## 2018-10-17 NOTE — Telephone Encounter (Signed)
Albuterol refilled. What dates is she traveling? When will she be back in town?

## 2018-10-17 NOTE — Telephone Encounter (Signed)
My apologies. Pt is planning to travel for work 10/19- 10/22, returning Fri, 10/23.

## 2018-10-18 LAB — NOVEL CORONAVIRUS, NAA: SARS-CoV-2, NAA: NOT DETECTED

## 2018-10-24 ENCOUNTER — Encounter: Payer: Self-pay | Admitting: Family Medicine

## 2018-10-24 ENCOUNTER — Ambulatory Visit (INDEPENDENT_AMBULATORY_CARE_PROVIDER_SITE_OTHER): Payer: BC Managed Care – PPO | Admitting: Family Medicine

## 2018-10-24 ENCOUNTER — Other Ambulatory Visit: Payer: Self-pay

## 2018-10-24 ENCOUNTER — Ambulatory Visit (INDEPENDENT_AMBULATORY_CARE_PROVIDER_SITE_OTHER)
Admission: RE | Admit: 2018-10-24 | Discharge: 2018-10-24 | Disposition: A | Discharge status: Home / Self Care | Payer: BC Managed Care – PPO | Source: Ambulatory Visit | Attending: Family Medicine | Admitting: Family Medicine

## 2018-10-24 VITALS — BP 122/80 | HR 93 | Temp 98.2°F | Ht 64.0 in | Wt 196.3 lb

## 2018-10-24 DIAGNOSIS — J40 Bronchitis, not specified as acute or chronic: Secondary | ICD-10-CM | POA: Diagnosis not present

## 2018-10-24 DIAGNOSIS — R0602 Shortness of breath: Secondary | ICD-10-CM

## 2018-10-24 DIAGNOSIS — U071 COVID-19: Secondary | ICD-10-CM

## 2018-10-24 DIAGNOSIS — R0600 Dyspnea, unspecified: Secondary | ICD-10-CM | POA: Diagnosis not present

## 2018-10-24 DIAGNOSIS — R05 Cough: Secondary | ICD-10-CM | POA: Diagnosis not present

## 2018-10-24 DIAGNOSIS — R350 Frequency of micturition: Secondary | ICD-10-CM

## 2018-10-24 LAB — POC URINALSYSI DIPSTICK (AUTOMATED)
Bilirubin, UA: NEGATIVE
Blood, UA: NEGATIVE
Glucose, UA: NEGATIVE
Ketones, UA: NEGATIVE
Leukocytes, UA: NEGATIVE
Nitrite, UA: NEGATIVE
Protein, UA: NEGATIVE
Spec Grav, UA: 1.01 (ref 1.010–1.025)
Urobilinogen, UA: 0.2 E.U./dL
pH, UA: 6 (ref 5.0–8.0)

## 2018-10-24 MED ORDER — ALBUTEROL SULFATE (2.5 MG/3ML) 0.083% IN NEBU: 2.5000 mg | INHALATION_SOLUTION | Freq: Four times a day (QID) | RESPIRATORY_TRACT | 0 refills | Status: DC | PRN

## 2018-10-24 MED ORDER — PREDNISONE 20 MG PO TABS: ORAL_TABLET | ORAL | 0 refills | Status: DC

## 2018-10-24 MED ORDER — FLUTICASONE-SALMETEROL 250-50 MCG/DOSE IN AEPB: 1.0000 | INHALATION_SPRAY | Freq: Two times a day (BID) | RESPIRATORY_TRACT | 1 refills | Status: DC

## 2018-10-24 NOTE — Assessment & Plan Note (Addendum)
Normal UA today. Anticipate frequency due to coughing fits.

## 2018-10-24 NOTE — Progress Notes (Signed)
This visit was conducted in person.  BP 122/80 (BP Location: Left Arm, Patient Position: Sitting, Cuff Size: Large)   Pulse 93   Temp 98.2 F (36.8 C) (Temporal)   Ht 5\' 4"  (1.626 m)   Wt 196 lb 5 oz (89 kg)   LMP 01/27/2000   SpO2 97%   BMI 33.70 kg/m    CC: persistent cough, dyspnea after covid Subjective:    Patient ID: Cassie [name], female    DOB: [birth year], 48 y.o.   MRN: [id number]  HPI: Cassie [name] is a 48 y.o. female presenting on 10/24/2018 for Shortness of Breath and Urinary Frequency (C/o urinary frequency.  Started 2-3 wks ago. )   See recent OV and phone notes.   Prolonged Covid related illness since late August 2020. Since then, has been seen at ER x2 and virtually here several times, treated for Covid bronchitis with prednisone + azithromycin on 09/09/2018. Asthmatic, has been using albuterol inhaler regularly. Most recently treating with tussionex pennkinetic for cough.   She was released from quarantine by the health department in early October. She physically returned to work Oct 12th.   Ongoing coughing fits with dyspnea especially with minimal exertion or with talking.  Chest burning is improved.  Post tussive emesis.  She continues tessalon perls during the day, tussionex pennkinetic at night (sedated the next morning).  She does have nebulizer machine at home.  Continues albuterol inhaler 2 puffs once daily.   By the way - several weeks of increased urinary frequency and urgency, nocturia but without dysuria, hematuria, flank pain or n/v. No stress incontinence symptoms.   Initial covid test positive 08/28/2018 Rpt covid test negative 10/16/2018      Relevant past medical, surgical, family and social history reviewed and updated as indicated. Interim medical history since our last visit reviewed. Allergies and medications reviewed and updated. Outpatient Medications Prior to Visit  Medication Sig Dispense Refill  . albuterol (VENTOLIN HFA)  108 (90 Base) MCG/ACT inhaler TAKE 2 PUFFS BY MOUTH EVERY 6 HOURS AS NEEDED FOR WHEEZE OR SHORTNESS OF BREATH 18 g 1  . benzonatate (TESSALON) 200 MG capsule Take 1 capsule (200 mg total) by mouth 2 (two) times daily as needed for cough. 20 capsule 0  . chlorpheniramine-HYDROcodone (TUSSIONEX PENNKINETIC ER) 10-8 MG/5ML SUER Take 5 mLs by mouth 2 (two) times daily as needed for cough (sedation precautions). 180 mL 0  . predniSONE (DELTASONE) 20 MG tablet Take two tablets daily for 3 days followed by one tablet daily for 4 days 10 tablet 0   No facility-administered medications prior to visit.      Per HPI unless specifically indicated in ROS section below Review of Systems Objective:    BP 122/80 (BP Location: Left Arm, Patient Position: Sitting, Cuff Size: Large)   Pulse 93   Temp 98.2 F (36.8 C) (Temporal)   Ht 5\' 4"  (1.626 m)   Wt 196 lb 5 oz (89 kg)   LMP 01/27/2000   SpO2 97%   BMI 33.70 kg/m   Wt Readings from Last 3 Encounters:  10/24/18 196 lb 5 oz (89 kg)  10/03/18 190 lb (86.2 kg)  09/15/18 190 lb (86.2 kg)    Physical Exam Vitals signs and nursing note reviewed.  Constitutional:      General: She is not in acute distress.    Appearance: Normal appearance. She is obese. She is not ill-appearing.  HENT:     Head: Normocephalic and atraumatic.  Mouth/Throat:     Comments: Mouth not evaluated due to mask use Cardiovascular:     Rate and Rhythm: Normal rate and regular rhythm.     Pulses: Normal pulses.     Heart sounds: Normal heart sounds. No murmur.  Pulmonary:     Effort: Pulmonary effort is normal. No respiratory distress.     Breath sounds: Wheezing (exp throughout) present. No rhonchi or rales.     Comments: Tight air movement Neurological:     Mental Status: She is alert.  Psychiatric:        Mood and Affect: Mood normal.        Behavior: Behavior normal.       Results for orders placed or performed in visit on 10/24/18  POCT Urinalysis Dipstick  (Automated)  Result Value Ref Range   Color, UA light yellow    Clarity, UA clear    Glucose, UA Negative Negative   Bilirubin, UA negative    Ketones, UA negative    Spec Grav, UA 1.010 1.010 - 1.025   Blood, UA negative    pH, UA 6.0 5.0 - 8.0   Protein, UA Negative Negative   Urobilinogen, UA 0.2 0.2 or 1.0 E.U./dL   Nitrite, UA negative    Leukocytes, UA Negative Negative   EKG - NSR rate 75, normal axis, intervals, no acute ST/T changes  Assessment & Plan:   Problem List Items Addressed This Visit    Urinary frequency    Normal UA today. Anticipate frequency due to coughing fits.       Relevant Orders   POCT Urinalysis Dipstick (Automated) (Completed)   Bronchitis due to COVID-19 virus - Primary    Prolonged illness, very slow recovery with ongoing coughing fits with exertional shortness of breath and hoarseness going into 8th week of illness. She had negative covid test, outside of window of infectivity. EKG WNL, CXR clear on my read.  Anticipate symptoms from prolonged covid illness exacerbated by asthma history - there is evidence of airway reactivity on exam. Treat with more intense and shorter prednisone taper as well as start albuterol neb TID for next several days, then start ICS/LABA for persistent airway reactivity. Pt agrees with plan.        Other Visit Diagnoses    Shortness of breath       Relevant Orders   DG Chest 2 View   CBC with Differential/Platelet   Comprehensive metabolic panel   TSH   Brain natriuretic peptide   EKG 12-Lead (Completed)       Meds ordered this encounter  Medications  . predniSONE (DELTASONE) 20 MG tablet    Sig: Take 3 tablets daily for 2 days followed by 2 tablet daily for 2 days then 1 tablet daily for 2 days    Dispense:  12 tablet    Refill:  0  . albuterol (PROVENTIL) (2.5 MG/3ML) 0.083% nebulizer solution    Sig: Take 3 mLs (2.5 mg total) by nebulization every 6 (six) hours as needed for wheezing or shortness of  breath.    Dispense:  150 mL    Refill:  0  . Fluticasone-Salmeterol (ADVAIR DISKUS) 250-50 MCG/DOSE AEPB    Sig: Inhale 1 puff into the lungs 2 (two) times daily.    Dispense:  1 each    Refill:  1   Orders Placed This Encounter  Procedures  . DG Chest 2 View    Standing Status:   Future    Number  of Occurrences:   1    Standing Expiration Date:   12/24/2019    Order Specific Question:   Reason for Exam (SYMPTOM  OR DIAGNOSIS REQUIRED)    Answer:   persistent cough, dyspnea after covid    Order Specific Question:   Is patient pregnant?    Answer:   No    Order Specific Question:   Preferred imaging location?    Answer:   [facility]    Order Specific Question:   Radiology Contrast Protocol - do NOT remove file path    Answer:   [email or web address]  . CBC with Differential/Platelet    Standing Status:   Future    Number of Occurrences:   1    Standing Expiration Date:   10/24/2019  . Comprehensive metabolic panel    Standing Status:   Future    Number of Occurrences:   1    Standing Expiration Date:   10/24/2019  . TSH    Standing Status:   Future    Number of Occurrences:   1    Standing Expiration Date:   10/24/2019  . Brain natriuretic peptide    Standing Status:   Future    Number of Occurrences:   1    Standing Expiration Date:   10/24/2019  . POCT Urinalysis Dipstick (Automated)  . EKG 12-Lead    Patient Instructions  EKG looking ok. We will be in touch with results. Take higher intensity prednisone taper, albuterol nebulizer 3 times a day. Once done with prednisone taper, may start controller breathing medication (Advair or Symbicort).  Update [place] with effect.    Follow up plan: No follow-ups on file.  Ria Bush, MD

## 2018-10-24 NOTE — Patient Instructions (Addendum)
EKG looking ok. We will be in touch with results. Take higher intensity prednisone taper, albuterol nebulizer 3 times a day. Once done with prednisone taper, may start controller breathing medication (Advair or Symbicort).  Update [place] with effect.

## 2018-10-24 NOTE — Assessment & Plan Note (Signed)
Prolonged illness, very slow recovery with ongoing coughing fits with exertional shortness of breath and hoarseness going into 8th week of illness. She had negative covid test, outside of window of infectivity. EKG WNL, CXR clear on my read.  Anticipate symptoms from prolonged covid illness exacerbated by asthma history - there is evidence of airway reactivity on exam. Treat with more intense and shorter prednisone taper as well as start albuterol neb TID for next several days, then start ICS/LABA for persistent airway reactivity. Pt agrees with plan.

## 2018-10-25 LAB — CBC WITH DIFFERENTIAL/PLATELET
Absolute Monocytes: 468 cells/uL (ref 200–950)
Basophils Absolute: 39 cells/uL (ref 0–200)
Basophils Relative: 0.7 %
Eosinophils Absolute: 72 cells/uL (ref 15–500)
Eosinophils Relative: 1.3 %
HCT: 40.1 % (ref 35.0–45.0)
Hemoglobin: 13.9 g/dL (ref 11.7–15.5)
Lymphs Abs: 1606 cells/uL (ref 850–3900)
MCH: 33 pg (ref 27.0–33.0)
MCHC: 34.7 g/dL (ref 32.0–36.0)
MCV: 95.2 fL (ref 80.0–100.0)
MPV: 11.2 fL (ref 7.5–12.5)
Monocytes Relative: 8.5 %
Neutro Abs: 3317 cells/uL (ref 1500–7800)
Neutrophils Relative %: 60.3 %
Platelets: 226 10*3/uL (ref 140–400)
RBC: 4.21 10*6/uL (ref 3.80–5.10)
RDW: 12.9 % (ref 11.0–15.0)
Total Lymphocyte: 29.2 %
WBC: 5.5 10*3/uL (ref 3.8–10.8)

## 2018-10-25 LAB — COMPREHENSIVE METABOLIC PANEL
AG Ratio: 1.8 (calc) (ref 1.0–2.5)
ALT: 38 U/L — ABNORMAL HIGH (ref 6–29)
AST: 26 U/L (ref 10–35)
Albumin: 4.6 g/dL (ref 3.6–5.1)
Alkaline phosphatase (APISO): 85 U/L (ref 31–125)
BUN: 16 mg/dL (ref 7–25)
CO2: 24 mmol/L (ref 20–32)
Calcium: 10.7 mg/dL — ABNORMAL HIGH (ref 8.6–10.2)
Chloride: 109 mmol/L (ref 98–110)
Creat: 1.04 mg/dL (ref 0.50–1.10)
Globulin: 2.5 g/dL (calc) (ref 1.9–3.7)
Glucose, Bld: 77 mg/dL (ref 65–99)
Potassium: 4.6 mmol/L (ref 3.5–5.3)
Sodium: 146 mmol/L (ref 135–146)
Total Bilirubin: 0.5 mg/dL (ref 0.2–1.2)
Total Protein: 7.1 g/dL (ref 6.1–8.1)

## 2018-10-25 LAB — TSH: TSH: 1.46 mIU/L

## 2018-10-25 LAB — BRAIN NATRIURETIC PEPTIDE: Brain Natriuretic Peptide: 32 pg/mL (ref <100)

## 2018-10-30 ENCOUNTER — Telehealth: Payer: Self-pay

## 2018-10-30 NOTE — Telephone Encounter (Signed)
Received faxed PA form for Wixela Inhub 250-50 mcg/dose.  Placed form in Dr. Synthia Innocent box.

## 2018-11-05 NOTE — Telephone Encounter (Signed)
Filled and in Lisa's box 

## 2018-11-05 NOTE — Telephone Encounter (Signed)
Faxed form.  Decision pending.  

## 2018-11-07 MED ORDER — ADVAIR DISKUS 250-50 MCG/DOSE IN AEPB: 1.0000 | INHALATION_SPRAY | Freq: Two times a day (BID) | RESPIRATORY_TRACT | 3 refills | Status: DC

## 2018-11-07 NOTE — Addendum Note (Signed)
Addended by: Ria Bush on: 11/07/2018 05:30 PM   Modules accepted: Orders

## 2018-11-07 NOTE — Telephone Encounter (Addendum)
Stephanie from [place] called to complete PA on [facility], advised from notes below that form was on Dr Synthia Innocent desk to fill out and we will fax it after that. Colletta [place] did want to mention that patient's insurance Does Not require PA for Advair Discuss DAW brand name but does require PA for [facility] and Fluticasone-Salmeterol.  If Dr Darnell Level decides not to do PA and just sends RX for brand name please call [employer] back to cancel PA, that would be helpful to them, CB is [phone] CASE # [id number].

## 2018-11-07 NOTE — Telephone Encounter (Addendum)
Rx started 2 weeks ago. Advair DAW sent in its place. plz call to cancel PA.

## 2018-11-07 NOTE — Telephone Encounter (Signed)
Received PA medication specific form from [place].  Placed form in Dr. Synthia Innocent box.

## 2018-11-10 NOTE — Telephone Encounter (Signed)
Spoke with [employer] [place] to c/x PA.  Says they will document request and c/x.

## 2018-11-14 ENCOUNTER — Telehealth: Payer: Self-pay

## 2018-11-14 DIAGNOSIS — J40 Bronchitis, not specified as acute or chronic: Secondary | ICD-10-CM

## 2018-11-14 DIAGNOSIS — R05 Cough: Secondary | ICD-10-CM

## 2018-11-14 DIAGNOSIS — R490 Dysphonia: Secondary | ICD-10-CM

## 2018-11-14 DIAGNOSIS — R053 Chronic cough: Secondary | ICD-10-CM

## 2018-11-14 DIAGNOSIS — J4521 Mild intermittent asthma with (acute) exacerbation: Secondary | ICD-10-CM

## 2018-11-14 NOTE — Telephone Encounter (Signed)
Pt called and after 11 wks post positive covid test pt said she is not feeling any better. Pt said she thought she was some better after last round of prednisone but when weather changes pt said she is as hoarse as she ever was;still dry cough,feels drained,fatigue,SOB upon exertion. Pt has noticed more bruising than usual. Pt has bruise on arm and thigh and pt does not know when or how she got the bruises. Pt using advair has directed and that does help and using nebulizer about once daily after a bad coughing episode. Pt did not want virtual with another provider but wanted Dr Darnell Level to be aware of how she is doing and request cb.[facility]. Pt said she is not in distress now and UC & ED precautions given and pt voiced understanding.

## 2018-11-14 NOTE — Telephone Encounter (Signed)
Spoke with Cassie [name]. Persistent hoarseness, worse wheezing dyspnea waking up at night with cough worse since weather change 2 days ago.  Taking advair 250/50mg  1 puff BID Taking albuterol neb about once daily, alb inhaler twice daily.  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

## 2018-11-17 NOTE — Telephone Encounter (Signed)
Pulm Appt scheduled with Dr Melvyn Novas on 11/21/18 and patient is aware.

## 2018-11-21 ENCOUNTER — Encounter: Payer: Self-pay | Admitting: Internal Medicine

## 2018-11-21 ENCOUNTER — Ambulatory Visit: Payer: BC Managed Care – PPO | Admitting: Internal Medicine

## 2018-11-21 ENCOUNTER — Other Ambulatory Visit: Payer: Self-pay

## 2018-11-21 DIAGNOSIS — R05 Cough: Secondary | ICD-10-CM | POA: Diagnosis not present

## 2018-11-21 DIAGNOSIS — R058 Other specified cough: Secondary | ICD-10-CM

## 2018-11-21 MED ORDER — FAMOTIDINE 20 MG PO TABS: ORAL_TABLET | ORAL | 11 refills | Status: DC

## 2018-11-21 MED ORDER — PANTOPRAZOLE SODIUM 40 MG PO TBEC: 40.0000 mg | DELAYED_RELEASE_TABLET | Freq: Every day | ORAL | 2 refills | Status: DC

## 2018-11-21 MED ORDER — TRAMADOL HCL 50 MG PO TABS: 50.0000 mg | ORAL_TABLET | ORAL | 0 refills | Status: AC | PRN

## 2018-11-21 MED ORDER — PREDNISONE 10 MG PO TABS: ORAL_TABLET | ORAL | 0 refills | Status: DC

## 2018-11-21 NOTE — Assessment & Plan Note (Signed)
Onset in late  Aug 2020 with Covid 19 pos 08/30/18 not admitted  - cyclical cough rx 123XX123   The most common causes of chronic cough in immunocompetent adults include the following: upper airway cough syndrome (UACS), previously referred to as postnasal drip syndrome (PNDS), which is caused by variety of rhinosinus conditions; (2) asthma; (3) GERD; (4) chronic bronchitis from cigarette smoking or other inhaled environmental irritants; (5) nonasthmatic eosinophilic bronchitis; and (6) bronchiectasis.   These conditions, singly or in combination, have accounted for up to 94% of the causes of chronic cough in prospective studies.   Other conditions have constituted no >6% of the causes in prospective studies These have included bronchogenic carcinoma, chronic interstitial pneumonia, sarcoidosis, left ventricular failure, ACEI-induced cough, and aspiration from a condition associated with pharyngeal dysfunction.    Chronic cough is often simultaneously caused by more than one condition. A single cause has been found from 38 to 82% of the time, multiple causes from 18 to 62%. Multiply caused cough has been the result of three diseases up to 42% of the time.       Of the three most common causes of  Sub-acute / recurrent or chronic cough, only one (GERD)  can actually contribute to/ trigger  the other two (asthma and post nasal drip syndrome)  and perpetuate the cylce of cough.  While not intuitively obvious, many patients with chronic low grade reflux do not cough until there is a primary insult that disturbs the protective epithelial barrier and exposes sensitive nerve endings.   This is typically post-viral but can due to PNDS and  Former clearly  applies here.     >>>>  The point is that once this occurs, it is difficult to eliminate the cycle  using anything but a maximally effective acid suppression regimen at least in the short run, accompanied by an appropriate diet to address non acid GERD  and control / eliminate the cough itself for at least 3 days with tramadol and  Also added 6 days of Prednisone in case of component of Th-2 driven upper or lower airways inflammation (if cough responds short term only to relapse befor return while will on rx for uacs that would point to allergic rhinitis/ asthma or eos bronchitis) .   Discussed in detail all the  indications, usual  risks and alternatives  relative to the benefits with patient who agrees to proceed with Rx as outlined.      Total time devoted to counseling  > 50 % of initial 60 min office visit:  review case with pt/ discussion of options/alternatives/ personally creating a diagram of pathophysiology of cyclical cough with  written customized instructions  in presence of pt  then going over those specific  Instructions directly with the pt including how to use all of the meds but in particular covering each new medication in detail and the difference between the maintenance= "automatic" meds and the prns using an action plan format for the latter (If this problem/symptom => do that organization reading Left to right).  Please see AVS from this visit for a full list of these instructions which I personally wrote for this pt and  are unique to this visit.

## 2018-11-21 NOTE — Patient Instructions (Addendum)
The key to effective treatment for your cough is eliminating the non-stop cycle of cough you're stuck in long enough to let your airway heal completely and then see if there is anything still making you cough once you stop the cough suppression, but this should take no more than 5 days to figure out  First take delsym two tsp every 12 hours and supplement if needed with  tramadol 50 mg up to 2 every 4 hours to suppress the urge to cough at all or even clear your throat. Swallowing water or using ice chips/non mint and menthol containing candies (such as lifesavers or sugarless jolly ranchers) are also effective.  You should rest your voice and avoid activities that you know make you cough.  Once you have eliminated the cough for 3 straight days try reducing the tramadol first,  then the delsym as tolerated.    Prednisone 10 mg take  4 each am x 2 days,   2 each am x 2 days,  1 each am x 2 days and stop (this is to eliminate allergies and inflammation from coughing)  Protonix (pantoprazole) Take 30-60 min before first meal of the day and Pepcid 20 mg one hour before bedtime until return   Stop advair and albuterol inhaler  Only use your albuterol nebulizer as a rescue medication to be used if you can't catch your breath by resting   - The less you use it, the better it will work when you need it. - Ok to use up to one time  every 4 hours if you must but call for immediate appointment if use goes up over your usual need    GERD (REFLUX)  is an extremely common cause of respiratory symptoms, many times with no significant heartburn at all.    It can be treated with medication, but also with lifestyle changes including avoidance of late meals, excessive alcohol, smoking cessation, and avoid fatty foods, chocolate, peppermint, colas, red wine, and acidic juices such as orange juice.  NO MINT OR MENTHOL PRODUCTS SO NO COUGH DROPS   USE HARD CANDY INSTEAD (jolley ranchers or Stover's or Lifesavers (all  available in sugarless versions) NO OIL BASED VITAMINS - use powdered substitutes.  Please schedule a follow up office visit in 2 weeks, sooner if needed

## 2018-11-21 NOTE — Progress Notes (Signed)
[name], female    DOB: 19-Dec-1970,    MRN: [id number]   Brief patient profile:  73 yowf grew up in [place] to [place] and stayed, stopped smoking completely but not much of smoker really and some springtime rhinitis worse since [place] and assoc winter time bronchial asthma on short term saba then covid 19 Aug 30 2018 req ER eval  09/15/2018 >  Prednisone / albuterol  > transiently better but still no voice and cough >repeat x 3 total pred each time about 50% then relapase w/in a week and last dose 11/12/2018 and worse since so referred to pulmonary clinic 11/21/2018 by Dr   Danise Mina.     History of Present Illness  11/21/2018  Pulmonary/ 1st office eval/Mohmmad Saleeby  Chief Complaint  Patient presents with  . Consult    Patient was diagnosed with Covid in Aug and hasn't had a voice since Aug and has had shortness of breath. Patient feels like she is getting worse since its getting colder out side. Patient states she still has a cough but is not productive. Patient states she still feels tired all the time but worse with exertion. Patient also states she has bruising that has poped up on her skin without injury.  Dyspnea:  100 ft or so Cough: daytime with walking or activity,  W/in hour of hs  Despite 2 pillows, no sputum Taking advair x 2weeks/ no better  saba 2 bid and neb  Tessalon helps some but only using at hs  No obvious other patterns in  day to day or daytime variability or assoc excess/ purulent sputum or mucus plugs or hemoptysis or cp or chest tightness, subjective wheeze or overt sinus or hb symptoms.    Also denies any obvious fluctuation of symptoms with weather or environmental changes or other aggravating or alleviating factors except as outlined above   No unusual exposure hx or h/o childhood pna/ asthma or knowledge of premature birth.  Current Allergies, Complete Past Medical History, Past Surgical History, Family History, and Social History were reviewed  in [employer] [employer] record.  ROS  The following are not active complaints unless bolded Hoarseness, sore throat, dysphagia, dental problems, itching, sneezing,  nasal congestion or discharge of excess mucus or purulent secretions, ear ache,   fever, chills, sweats, unintended wt loss or wt gain, classically pleuritic or exertional cp,  orthopnea pnd or arm/hand swelling  or leg swelling, presyncope, palpitations, abdominal pain, anorexia, nausea, vomiting, diarrhea  or change in bowel habits or change in bladder habits, change in stools or change in urine, dysuria, hematuria,  rash, arthralgias, visual complaints, headache, numbness, weakness or ataxia or problems with walking or coordination,  change in mood or  memory.               Past Medical History:  Diagnosis Date  . Allergy   . Asthma   . Cardiac disease   . Diffuse cystic mastopathy   . GERD (gastroesophageal reflux disease)   . Guttate psoriasis   . H/O syncope    cards work-up WNL (Hochrein) thought 2/2 overmedication vs arrhythmias  . Hx of endometriosis    hysterectomy  . Hx of migraines    occasional; improved since hysterectomy  . Labral tear of shoulder 02/2014   R, pending surgery Tamera Punt)  . Rosacea    mild    Outpatient Medications Prior to Visit  Medication Sig Dispense Refill  . ADVAIR DISKUS 250-50  MCG/DOSE AEPB Inhale 1 puff into the lungs 2 (two) times daily. 1 each 3  . albuterol (PROVENTIL) (2.5 MG/3ML) 0.083% nebulizer solution Take 3 mLs (2.5 mg total) by nebulization every 6 (six) hours as needed for wheezing or shortness of breath. 150 mL 0  . albuterol (VENTOLIN HFA) 108 (90 Base) MCG/ACT inhaler TAKE 2 PUFFS BY MOUTH EVERY 6 HOURS AS NEEDED FOR WHEEZE OR SHORTNESS OF BREATH 18 g 1  . benzonatate (TESSALON) 200 MG capsule Take 1 capsule (200 mg total) by mouth 2 (two) times daily as needed for cough. (Patient not taking: Reported on 11/21/2018) 20 capsule 0  .  chlorpheniramine-HYDROcodone (TUSSIONEX PENNKINETIC ER) 10-8 MG/5ML SUER Take 5 mLs by mouth 2 (two) times daily as needed for cough (sedation precautions). (Patient not taking: Reported on 11/21/2018) 180 mL 0  .              Objective:     BP 110/70 (BP Location: Left Arm, Patient Position: Sitting, Cuff Size: Normal)   Pulse (!) 105   Temp 98.3 F (36.8 C) (Temporal)   Ht 5' 5.75" (1.67 m)   Wt 198 lb 12.8 oz (90.2 kg)   LMP 01/27/2000   SpO2 96% Comment: on RA  BMI 32.33 kg/m   SpO2: 96 %(on RA)  Wt Readings from Last 3 Encounters:  11/21/18 198 lb 12.8 oz (90.2 kg)  10/24/18 196 lb 5 oz (89 kg)  10/03/18 190 lb (86.2 kg)     Vital signs reviewed - Note on arrival 02 sats  96% on RA    HEENT : pt wearing mask not removed for exam due to covid -19 concerns.    NECK :  without JVD/Nodes/TM/ nl carotid upstrokes bilaterally   LUNGS: no acc muscle use,  Nl contour chest which is clear to A and P bilaterally with severe cough on insp to point of gagging.   CV:  RRR  no s3 or murmur or increase in P2, and no edema   ABD:  soft and nontender with nl inspiratory excursion in the supine position. No bruits or organomegaly appreciated, bowel sounds nl  MS:  Nl gait/ ext warm without deformities, calf tenderness, cyanosis or clubbing No obvious joint restrictions   SKIN: warm and dry without lesions    NEURO:  alert, approp, nl sensorium with  no motor or cerebellar deficits apparent.        I personally reviewed images and agree with radiology impression as follows:  CXR:   10/24/2018  No active cardiopulmonary disease.     Assessment   Upper airway cough syndrome Onset in late  Aug 2020 with Covid 19 pos 08/30/18 not admitted  - cyclical cough rx 123XX123   The most common causes of chronic cough in immunocompetent adults include the following: upper airway cough syndrome (UACS), previously referred to as postnasal drip syndrome (PNDS), which is caused by  variety of rhinosinus conditions; (2) asthma; (3) GERD; (4) chronic bronchitis from cigarette smoking or other inhaled environmental irritants; (5) nonasthmatic eosinophilic bronchitis; and (6) bronchiectasis.   These conditions, singly or in combination, have accounted for up to 94% of the causes of chronic cough in prospective studies.   Other conditions have constituted no >6% of the causes in prospective studies These have included bronchogenic carcinoma, chronic interstitial pneumonia, sarcoidosis, left ventricular failure, ACEI-induced cough, and aspiration from a condition associated with pharyngeal dysfunction.    Chronic cough is often simultaneously caused by more than one  condition. A single cause has been found from 38 to 82% of the time, multiple causes from 18 to 62%. Multiply caused cough has been the result of three diseases up to 42% of the time.       Of the three most common causes of  Sub-acute / recurrent or chronic cough, only one (GERD)  can actually contribute to/ trigger  the other two (asthma and post nasal drip syndrome)  and perpetuate the cylce of cough.  While not intuitively obvious, many patients with chronic low grade reflux do not cough until there is a primary insult that disturbs the protective epithelial barrier and exposes sensitive nerve endings.   This is typically post-viral but can due to PNDS and  Former clearly  applies here.       >>>>  The point is that once this occurs, it is difficult to eliminate the cycle  using anything but a maximally effective acid suppression regimen at least in the short run, accompanied by an appropriate diet to address non acid GERD and control / eliminate the cough itself for at least 3 days with tramadol and  Also added 6 days of Prednisone in case of component of Th-2 driven upper or lower airways inflammation (if cough responds short term only to relapse befor return while will on rx for uacs that would point to allergic  rhinitis/ asthma or eos bronchitis) .   Discussed in detail all the  indications, usual  risks and alternatives  relative to the benefits with patient who agrees to proceed with Rx as outlined.      Total time devoted to counseling  > 50 % of initial 60 min office visit:  review case with pt/ discussion of options/alternatives/ personally creating a diagram of pathophysiology of cyclical cough with  written customized instructions  in presence of pt  then going over those specific  Instructions directly with the pt including how to use all of the meds but in particular covering each new medication in detail and the difference between the maintenance= "automatic" meds and the prns using an action plan format for the latter (If this problem/symptom => do that organization reading Left to right).  Please see AVS from this visit for a full list of these instructions which I personally wrote for this pt and  are unique to this visit.      Christinia Gully, MD 11/21/2018

## 2018-12-01 ENCOUNTER — Other Ambulatory Visit (INDEPENDENT_AMBULATORY_CARE_PROVIDER_SITE_OTHER): Payer: BC Managed Care – PPO

## 2018-12-01 ENCOUNTER — Encounter: Payer: Self-pay | Admitting: Adult Health

## 2018-12-01 ENCOUNTER — Telehealth (INDEPENDENT_AMBULATORY_CARE_PROVIDER_SITE_OTHER): Payer: BC Managed Care – PPO | Admitting: Adult Health

## 2018-12-01 ENCOUNTER — Telehealth: Payer: Self-pay | Admitting: Internal Medicine

## 2018-12-01 VITALS — BP 118/64 | HR 109 | Temp 97.4°F | Ht 64.0 in | Wt 199.6 lb

## 2018-12-01 DIAGNOSIS — R0602 Shortness of breath: Secondary | ICD-10-CM

## 2018-12-01 DIAGNOSIS — R05 Cough: Secondary | ICD-10-CM

## 2018-12-01 DIAGNOSIS — J4521 Mild intermittent asthma with (acute) exacerbation: Secondary | ICD-10-CM | POA: Diagnosis not present

## 2018-12-01 DIAGNOSIS — R058 Other specified cough: Secondary | ICD-10-CM

## 2018-12-01 LAB — CBC WITH DIFFERENTIAL/PLATELET
Basophils Absolute: 0 10*3/uL (ref 0.0–0.1)
Basophils Relative: 0.8 % (ref 0.0–3.0)
Eosinophils Absolute: 0.1 10*3/uL (ref 0.0–0.7)
Eosinophils Relative: 1.3 % (ref 0.0–5.0)
HCT: 42 % (ref 36.0–46.0)
Hemoglobin: 14.4 g/dL (ref 12.0–15.0)
Lymphocytes Relative: 34.6 % (ref 12.0–46.0)
Lymphs Abs: 1.9 10*3/uL (ref 0.7–4.0)
MCHC: 34.4 g/dL (ref 30.0–36.0)
MCV: 95.4 fl (ref 78.0–100.0)
Monocytes Absolute: 0.4 10*3/uL (ref 0.1–1.0)
Monocytes Relative: 7.9 % (ref 3.0–12.0)
Neutro Abs: 3 10*3/uL (ref 1.4–7.7)
Neutrophils Relative %: 55.4 % (ref 43.0–77.0)
Platelets: 243 10*3/uL (ref 150.0–400.0)
RBC: 4.4 Mil/uL (ref 3.87–5.11)
RDW: 13 % (ref 11.5–15.5)
WBC: 5.4 10*3/uL (ref 4.0–10.5)

## 2018-12-01 LAB — BASIC METABOLIC PANEL
BUN: 15 mg/dL (ref 6–23)
CO2: 28 mEq/L (ref 19–32)
Calcium: 9.5 mg/dL (ref 8.4–10.5)
Chloride: 104 mEq/L (ref 96–112)
Creatinine, Ser: 0.95 mg/dL (ref 0.40–1.20)
GFR: 63.34 mL/min (ref >60.00)
Glucose, Bld: 99 mg/dL (ref 70–99)
Potassium: 3.9 mEq/L (ref 3.5–5.1)
Sodium: 139 mEq/L (ref 135–145)

## 2018-12-01 LAB — D-DIMER, QUANTITATIVE: D-Dimer, Quant: 0.47 mcg/mL FEU (ref <0.50)

## 2018-12-01 MED ORDER — BENZONATATE 200 MG PO CAPS: 200.0000 mg | ORAL_CAPSULE | Freq: Three times a day (TID) | ORAL | 3 refills | Status: DC | PRN

## 2018-12-01 NOTE — Telephone Encounter (Signed)
Call returned to patient, confirmed DOB, she reports increase SOB and cough that has slowly gotten worse since she had covid back in August. Denies fevers. Denies chest pain. She reports chest tightness and increased wheezing and voice hoarseness. She reports her cough is dry. She does report that she has been using her nebulizer but it just seems like SOB just keeps getting worse.   Virtual appt made. Nothing further needed at this time.

## 2018-12-01 NOTE — Patient Instructions (Addendum)
Delsym 2 tsp Twice daily  For cough  Tessalon Three times a day  For cough  Tramadol 50mg  1-2 every 4 hr as needed for severe cough , may make you sleep  Begin Chlorpheniramine 4mg  2 At bedtime  .  May use extra Chlorpheniramine 4mg  every 4hr as needed for drainage , throat clearing, use with caution as may make you sleepy.  Begin Allegra 180mg  daily in am .  Stop Tussionex.  Voice rest , sips of water to soothe throat  Avoid throat clearing. No singing .  Continue on Protonix and Pepcid.  Albuterol Neb every 4hr as needed for wheezing /shortness of breath .  I will call with lab results  Follow up in 7-10 days with Dr. Melvyn Novas  Or Leland Raver NP and As needed   Please contact office for sooner follow up if symptoms do not improve or worsen or seek emergency care

## 2018-12-01 NOTE — Progress Notes (Signed)
@Patient  ID: Cassie [name], female    DOB: 1970/05/29, 48 y.o.   MRN: [id number]    Referring provider: Ria Bush, MD  HPI: 48 year old female with remote smoking history seen for pulmonary consult November 21, 2018 for ongoing cough for 3 months after COVID-19 infection  TEST/EVENTS :  Chest x-ray September 15, 2018 clear lungs Chest x-ray October 24, 2018 clear lungs  December 01, 2018 D-dimer less than 0.50, CBC unrevealing eosinophils okay  12/01/2018 Acute OV , Cough  Patient returns for an acute office visit.  She complains of persistent symptoms of cough, wheezing and shortness of breath.  Patient had acute respiratory symptoms in August 2020.  She tested positive for COVID-19.  Says that she did not require hospitalization.  Recovered at home however continue to have a persistent nagging cough.  She also had hoarseness.  Says she has been treated several times for asthmatic bronchitis.  She has had 4 prednisone tapers.  She was given a Z-Pak at some point.  She was started on Advair without any perceived benefit.  She says she does get better for short period time but never is cough free and never is wheezing free.  She does use albuterol which helps some.  She was seen 2 weeks ago for an a pulmonary consult.  Advair was discontinued.  She was started on reflux regimen with Protonix and Pepcid.  Along with a cough suppression regimen of Delsym and tramadol.  She says she is taking tramadol with minimal improvement.  She has used some Delsym and Tessalon without much improvement.  Says she coughs the entire day and is interrupting her sleep.  She continues to have voice hoarseness.  She denies any overt reflux symptoms.  Has mild nasal drainage.  Denies sinus pain or pressure.  No discolored mucus.  No fever.  No loss of taste or smell.  Good appetite with no nausea vomiting diarrhea.  Chest x-ray in September and October were both clear.  She has no previous lung problems except  mild asthma when had URI.  Was healthy and on no medications prior to her COVID-19 infection. 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  Does travel.  She has no hot tub,  pets or unusual hobbies.     Allergies  Allergen Reactions   Fish Allergy Anaphylaxis, Hives and Swelling   Shellfish Allergy Anaphylaxis, Hives and Swelling   Chocolate     migraines   Penicillins Rash and Other (See Comments)    Parents are allergic Has patient had a PCN reaction causing immediate rash, facial/tongue/throat swelling, SOB or lightheadedness with hypotension: Yes Has patient had a PCN reaction causing severe rash involving mucus membranes or skin necrosis: Unk Has patient had a PCN reaction that required hospitalization: Unk Has patient had a PCN reaction occurring within the last 10 years: No If all of the above answers are "NO", then may proceed with Cephalosporin use.No    Tape Rash    Immunization History  Administered Date(s) Administered   Td 04/16/2007   Tdap 12/20/2017    Past Medical History:  Diagnosis Date   Allergy    Asthma    Cardiac disease    Diffuse cystic mastopathy    GERD (gastroesophageal reflux disease)    Guttate psoriasis    H/O syncope    cards work-up WNL (Hochrein) thought 2/2 overmedication vs arrhythmias   Hx of endometriosis    hysterectomy   Hx of migraines    occasional;  improved since hysterectomy   Labral tear of shoulder 02/2014   R, pending surgery Tamera Punt)   Rosacea    mild    Tobacco History: Social History   Tobacco Use  Smoking Status Former Smoker   Quit date: 02/21/2007   Years since quitting: 11.7  Smokeless Tobacco Never Used   Counseling given: Not Answered   Outpatient Medications Prior to Visit  Medication Sig Dispense Refill   albuterol (PROVENTIL) (2.5 MG/3ML) 0.083% nebulizer solution Take 3 mLs (2.5 mg total) by nebulization every 6 (six) hours as needed for wheezing or shortness of breath. 150 mL 0    famotidine (PEPCID) 20 MG tablet One after supper 30 tablet 11   pantoprazole (PROTONIX) 40 MG tablet Take 1 tablet (40 mg total) by mouth daily. Take 30-60 min before first meal of the day 30 tablet 2   predniSONE (DELTASONE) 10 MG tablet Take  4 each am x 2 days,   2 each am x 2 days,  1 each am x 2 days and stop (Patient not taking: Reported on 12/01/2018) 14 tablet 0   No facility-administered medications prior to visit.      Review of Systems:   Constitutional:   No  weight loss, night sweats,  Fevers, chills,  +fatigue, or  lassitude.  HEENT:   No headaches,  Difficulty swallowing,  Tooth/dental problems, or  Sore throat,                No sneezing, itching, ear ache, nasal congestion, post nasal drip,   CV:  No chest pain,  Orthopnea, PND, swelling in lower extremities, anasarca, dizziness, palpitations, syncope.   GI  No heartburn, indigestion, abdominal pain, nausea, vomiting, diarrhea, change in bowel habits, loss of appetite, bloody stools.   Resp: No shortness of breath with exertion or at rest.  No excess mucus, no productive cough,  No non-productive cough,  No coughing up of blood.  No change in color of mucus.  No wheezing.  No chest wall deformity  Skin: no rash or lesions.  GU: no dysuria, change in color of urine, no urgency or frequency.  No flank pain, no hematuria   MS:  No joint pain or swelling.  No decreased range of motion.  No back pain.    Physical Exam  GEN: A/Ox3; pleasant , NAD, well nourished    HEENT:  [place]/AT,  EACs-clear, TMs-wnl, NOSE-clear, THROAT-clear, no lesions, no postnasal drip or exudate noted.  nontender sinuses , + voice hoarsenss, no thrush noted.   NECK:  Supple w/ fair ROM; no JVD; normal carotid impulses w/o bruits; no thyromegaly or nodules palpated; no lymphadenopathy.   No Stridor   RESP  Clear  P & A; w/o, wheezes/ rales/ or rhonchi. no accessory muscle use, no dullness to percussion. Talks in full sentences, ++barking  cough , no wheezing , good air movement bilaterrally   CARD:  RRR, no m/r/g, no peripheral edema, pulses intact, no cyanosis or clubbing.  GI:   Soft & nt; nml bowel sounds; no organomegaly or masses detected.   Musco: Warm bil, no deformities or joint swelling noted.   Neuro: alert, no focal deficits noted.    Skin: Warm, no lesions or rashes    Lab Results:  CBC    Component Value Date/Time   WBC 5.5 10/24/2018 1630   RBC 4.21 10/24/2018 1630   HGB 13.9 10/24/2018 1630   HCT 40.1 10/24/2018 1630   PLT 226 10/24/2018 1630  MCV 95.2 10/24/2018 1630   MCH 33.0 10/24/2018 1630   MCHC 34.7 10/24/2018 1630   RDW 12.9 10/24/2018 1630   LYMPHSABS 1,606 10/24/2018 1630   MONOABS 0.5 09/15/2018 1330   EOSABS 72 10/24/2018 1630   BASOSABS 39 10/24/2018 1630    BMET    Component Value Date/Time   NA 146 10/24/2018 1630   K 4.6 10/24/2018 1630   CL 109 10/24/2018 1630   CO2 24 10/24/2018 1630   GLUCOSE 77 10/24/2018 1630   BUN 16 10/24/2018 1630   CREATININE 1.04 10/24/2018 1630   CALCIUM 10.7 (H) 10/24/2018 1630   GFRNONAA >60 09/15/2018 1330   GFRAA >60 09/15/2018 1330    BNP    Component Value Date/Time   BNP 32 10/24/2018 1630    ProBNP No results found for: PROBNP  Imaging: No results found.    No flowsheet data found.  No results found for: NITRICOXIDE      Assessment & Plan:   No problem-specific Assessment & Plan notes found for this encounter.     Rexene Edison, NP 12/01/2018

## 2018-12-02 NOTE — Assessment & Plan Note (Signed)
Suspect this is a post viral cough that will need aggressive cough suppression and trigger prevention Chest xray has been clear x 2 . Exam is unrevealing , doubt underlying sinus infection -however if persists consider CT sinus .  CBC today is normal , no elevated eosinophils. Doubt severe asthma as no significant improvement with steroid taper x 4 and Advair . - once cough is improved , would order full PFT . Consider Symbicort/Dulera challenge if not improving as not dry powder.  D Dimer is neg and no desats with ambulation. Very low probability for PE .  Patient education on Tramadol , will use short term to help with severe coughing only   Plan  Patient Instructions  Delsym 2 tsp Twice daily  For cough  Tessalon Three times a day  For cough  Tramadol 50mg  1-2 every 4 hr as needed for severe cough , may make you sleep  Begin Chlorpheniramine 4mg  2 At bedtime  .  May use extra Chlorpheniramine 4mg  every 4hr as needed for drainage , throat clearing, use with caution as may make you sleepy.  Begin Allegra 180mg  daily in am .  Stop Tussionex.  Voice rest , sips of water to soothe throat  Avoid throat clearing. No singing .  Continue on Protonix and Pepcid.  Albuterol Neb every 4hr as needed for wheezing /shortness of breath .  I will call with lab results  Follow up in 7-10 days with Dr. Melvyn Novas  Or Parrett NP and As needed   Please contact office for sooner follow up if symptoms do not improve or worsen or seek emergency care

## 2018-12-02 NOTE — Assessment & Plan Note (Signed)
?   Flare vs Cough variant asthma -  May use albuterol As needed   PFT when cough is improved   Plan  Patient Instructions  Delsym 2 tsp Twice daily  For cough  Tessalon Three times a day  For cough  Tramadol 50mg  1-2 every 4 hr as needed for severe cough , may make you sleep  Begin Chlorpheniramine 4mg  2 At bedtime  .  May use extra Chlorpheniramine 4mg  every 4hr as needed for drainage , throat clearing, use with caution as may make you sleepy.  Begin Allegra 180mg  daily in am .  Stop Tussionex.  Voice rest , sips of water to soothe throat  Avoid throat clearing. No singing .  Continue on Protonix and Pepcid.  Albuterol Neb every 4hr as needed for wheezing /shortness of breath .  I will call with lab results  Follow up in 7-10 days with Dr. Melvyn Novas  Or Niveah Boerner NP and As needed   Please contact office for sooner follow up if symptoms do not improve or worsen or seek emergency care

## 2018-12-03 ENCOUNTER — Other Ambulatory Visit: Payer: Self-pay | Admitting: Adult Health

## 2018-12-03 DIAGNOSIS — R058 Other specified cough: Secondary | ICD-10-CM

## 2018-12-03 DIAGNOSIS — R05 Cough: Secondary | ICD-10-CM

## 2018-12-03 MED ORDER — BUDESONIDE-FORMOTEROL FUMARATE 80-4.5 MCG/ACT IN AERO: 2.0000 | INHALATION_SPRAY | Freq: Two times a day (BID) | RESPIRATORY_TRACT | 5 refills | Status: DC

## 2018-12-05 ENCOUNTER — Ambulatory Visit: Payer: BC Managed Care – PPO | Admitting: Internal Medicine

## 2018-12-05 ENCOUNTER — Telehealth: Payer: Self-pay | Admitting: Adult Health

## 2018-12-05 NOTE — Telephone Encounter (Signed)
Referral for ENT was placed 12/03/2018  Called and spoke with pt letting her know we did place referral order to the ENT office she had seen in the past. Stated to pt if she wanted to call their office to see if she could put a rush on an appt she could and pt verbalized understanding. Nothing further needed.

## 2018-12-08 ENCOUNTER — Ambulatory Visit (INDEPENDENT_AMBULATORY_CARE_PROVIDER_SITE_OTHER): Payer: BC Managed Care – PPO

## 2018-12-08 ENCOUNTER — Other Ambulatory Visit: Payer: Self-pay | Admitting: Adult Health

## 2018-12-08 ENCOUNTER — Encounter: Payer: Self-pay | Admitting: Adult Health

## 2018-12-08 ENCOUNTER — Other Ambulatory Visit: Payer: Self-pay

## 2018-12-08 ENCOUNTER — Ambulatory Visit: Payer: BC Managed Care – PPO | Admitting: Adult Health

## 2018-12-08 VITALS — BP 124/72 | HR 102 | Temp 97.6°F | Ht 65.0 in | Wt 201.0 lb

## 2018-12-08 DIAGNOSIS — R05 Cough: Secondary | ICD-10-CM | POA: Diagnosis not present

## 2018-12-08 DIAGNOSIS — J4521 Mild intermittent asthma with (acute) exacerbation: Secondary | ICD-10-CM

## 2018-12-08 DIAGNOSIS — J849 Interstitial pulmonary disease, unspecified: Secondary | ICD-10-CM

## 2018-12-08 DIAGNOSIS — R058 Other specified cough: Secondary | ICD-10-CM

## 2018-12-08 NOTE — Progress Notes (Signed)
Error

## 2018-12-08 NOTE — Assessment & Plan Note (Addendum)
Continue trial of Symbicort. Hold on additional steroids at this time.  Albuterol as needed

## 2018-12-08 NOTE — Patient Instructions (Addendum)
Chest xray today .  Symbicort 2 puffs Twice daily  With spacer , rinse after use.  Delsym 2 tsp Twice daily  For cough  Tessalon Three times a day  For cough  Tramadol 50mg  1-2 every 4 hr as needed for severe cough , may make you sleep  Chlorpheniramine 4mg  2 At bedtime  .  May use extra Chlorpheniramine 4mg  every 4hr as needed for drainage , throat clearing, use with caution as may make you sleepy.  Allegra 180mg  daily in am .  Voice rest , sips of water to soothe throat  Avoid throat clearing. No singing .  Continue on Protonix and Pepcid.  Albuterol Neb every 4hr as needed for wheezing /shortness of breath .  Follow up with ENT as planned  Follow up in  2-3 weeks with Dr. Melvyn Novas  Or Parrett NP and As needed   Please contact office for sooner follow up if symptoms do not improve or worsen or seek emergency care

## 2018-12-08 NOTE — Assessment & Plan Note (Signed)
Chronic cough since COVID-19 diagnosis. Suspect this is a post viral cough triggered by possible postnasal drainage and or reflux.  We will continue on aggressive regimen for cough suppression, trigger prevention with GERD and chronic rhinitis.  Chest x-ray today does show some increased interstitial markings.  Will check a high-resolution CT chest.  Patient did not have any acute abnormalities on her initial 2 chest x-rays during acute COVID-19 and post COVID-19 infection.  She was able to stay at home with conservative therapy and did not have known COVID-19 pneumonia.  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 Unable to perform PFTs due to severe coughing.  Pending high-resolution CT chest could consider adding Neurontin for chronic cough.  Plan  Patient Instructions  Chest xray today .  Symbicort 2 puffs Twice daily  With spacer , rinse after use.  Delsym 2 tsp Twice daily  For cough  Tessalon Three times a day  For cough  Tramadol 50mg  1-2 every 4 hr as needed for severe cough , may make you sleep  Chlorpheniramine 4mg  2 At bedtime  .  May use extra Chlorpheniramine 4mg  every 4hr as needed for drainage , throat clearing, use with caution as may make you sleepy.  Allegra 180mg  daily in am .  Voice rest , sips of water to soothe throat  Avoid throat clearing. No singing .  Continue on Protonix and Pepcid.  Albuterol Neb every 4hr as needed for wheezing /shortness of breath .  Follow up with ENT as planned  Follow up in  2-3 weeks with Dr. Melvyn Novas  Or Juliany Daughety NP and As needed   Please contact office for sooner follow up if symptoms do not improve or worsen or seek emergency care

## 2018-12-08 NOTE — Progress Notes (Signed)
@Patient  ID: Cassie [name], female    DOB: 1970-04-25, 48 y.o.   MRN: [id number]  Chief Complaint  Patient presents with  . Follow-up    Cough    Referring provider: Ria Bush, MD  HPI: 48 year old female with remote smoking history seen for pulmonary consult November 21, 2018 for ongoing cough for 3 months after COVID-19 infection  TEST/EVENTS :  Chest x-ray September 15, 2018 clear lungs Chest x-ray October 24, 2018 clear lungs  December 01, 2018 D-dimer less than 0.50, CBC unrevealing eosinophils okay  12/08/2018 Follow up : Chronic Cough  Patient returns for a 1 week follow-up.  Patient was seen via telemedicine last visit for an ongoing cough that began in August 2020.  Patient tested positive for COVID-19.  Says prior to infection she was in good state of health with no respiratory symptoms.  She said shortly after diagnosis she developed a cough and has had persistent coughing ever since.  She has had associated severe coughing paroxysms, up hoarseness.  She says she has lost her voice since then.  She has been seen multiple times by different providers and given 4 different prednisone tapers and a Z-Pak.  She had no significant improvement.  She was also started on Advair without any perceived benefit.  She says she would get better slightly for short period of time and then cough would come right back.  She says she has never been cough free.  She has used albuterol with some help but no significant improvement in cough.  She has been started on a reflux regimen with Protonix and Pepcid.  She denies any sinus pain or pressure.  Last week lab work showed a negative D-dimer.  CBC was unremarkable and eosinophils were okay.  Chest x-ray on September 14 and October 24, 2018 showed clear lungs. She denies any discolored mucus, chest pain, orthopnea, fever, hemoptysis.  Patient does travel with work.  But has been unable to travel much due to her severe coughing.  She has no  unusual hobbies.  No pets or hot tub.  She has been referred to ENT for ongoing hoarseness. She was started on Symbicort last visit and has been taken for the last 3 or 4 days.  She has not seen any change in symptoms.  She was also placed on cough control regimen with Delsym, Tessalon Perles, tramadol and Chlortab's.  She says she is not seeing much improvement in her cough at all.   Allergies  Allergen Reactions  . Fish Allergy Anaphylaxis, Hives and Swelling  . Shellfish Allergy Anaphylaxis, Hives and Swelling  . Chocolate     migraines  . Penicillins Rash and Other (See Comments)    Parents are allergic Has patient had a PCN reaction causing immediate rash, facial/tongue/throat swelling, SOB or lightheadedness with hypotension: Yes Has patient had a PCN reaction causing severe rash involving mucus membranes or skin necrosis: Unk Has patient had a PCN reaction that required hospitalization: Unk Has patient had a PCN reaction occurring within the last 10 years: No If all of the above answers are "NO", then may proceed with Cephalosporin use.No   . Tape Rash    Immunization History  Administered Date(s) Administered  . Td 04/16/2007  . Tdap 12/20/2017    Past Medical History:  Diagnosis Date  . Allergy   . Asthma   . Cardiac disease   . Diffuse cystic mastopathy   . GERD (gastroesophageal reflux disease)   . Guttate psoriasis   .  H/O syncope    cards work-up WNL (Hochrein) thought 2/2 overmedication vs arrhythmias  . Hx of endometriosis    hysterectomy  . Hx of migraines    occasional; improved since hysterectomy  . Labral tear of shoulder 02/2014   R, pending surgery Tamera Punt)  . Rosacea    mild    Tobacco History: Social History   Tobacco Use  Smoking Status Former Smoker  . Quit date: 02/21/2007  . Years since quitting: 11.8  Smokeless Tobacco Never Used   Counseling given: Not Answered   Outpatient Medications Prior to Visit  Medication Sig Dispense  Refill  . albuterol (PROVENTIL) (2.5 MG/3ML) 0.083% nebulizer solution Take 3 mLs (2.5 mg total) by nebulization every 6 (six) hours as needed for wheezing or shortness of breath. 150 mL 0  . benzonatate (TESSALON) 200 MG capsule Take 1 capsule (200 mg total) by mouth 3 (three) times daily as needed for cough. 45 capsule 3  . budesonide-formoterol (SYMBICORT) 80-4.5 MCG/ACT inhaler Inhale 2 puffs into the lungs 2 (two) times daily. 1 Inhaler 5  . famotidine (PEPCID) 20 MG tablet One after supper 30 tablet 11  . pantoprazole (PROTONIX) 40 MG tablet Take 1 tablet (40 mg total) by mouth daily. Take 30-60 min before first meal of the day 30 tablet 2   No facility-administered medications prior to visit.      Review of Systems:   Constitutional:   No  weight loss, night sweats,  Fevers, chills, fatigue, or  lassitude.  HEENT:   No headaches,  Difficulty swallowing,  Tooth/dental problems, or  Sore throat,                No sneezing, itching, ear ache, nasal congestion, post nasal drip,   CV:  No chest pain,  Orthopnea, PND, swelling in lower extremities, anasarca, dizziness, palpitations, syncope.   GI  No heartburn, indigestion, abdominal pain, nausea, vomiting, diarrhea, change in bowel habits, loss of appetite, bloody stools.   Resp: .  No chest wall deformity  Skin: no rash or lesions.  GU: no dysuria, change in color of urine, no urgency or frequency.  No flank pain, no hematuria   MS:  No joint pain or swelling.  No decreased range of motion.  No back pain.    Physical Exam  BP 124/72 (BP Location: Left Arm, Cuff Size: Normal)   Pulse (!) 102   Temp 97.6 F (36.4 C) (Temporal)   Ht 5\' 5"  (1.651 m)   Wt 201 lb (91.2 kg)   LMP 01/27/2000   SpO2 96%   BMI 33.45 kg/m   GEN: A/Ox3; pleasant , NAD, well nourished    HEENT:  [place]/AT, NOSE-clear, THROAT-clear, no lesions, no postnasal drip or exudate noted.   NECK:  Supple w/ fair ROM; no JVD; normal carotid impulses w/o  bruits; no thyromegaly or nodules palpated; no lymphadenopathy.    RESP  Clear  P & A; w/o, wheezes/ rales/ or rhonchi. no accessory muscle use, no dullness to percussion, positive cough  CARD:  RRR, no m/r/g, no peripheral edema, pulses intact, no cyanosis or clubbing.  GI:   Soft & nt; nml bowel sounds; no organomegaly or masses detected.   Musco: Warm bil, no deformities or joint swelling noted.   Neuro: alert, no focal deficits noted.    Skin: Warm, no lesions or rashes    Lab Results:  CBC    Component Value Date/Time   WBC 5.4 12/01/2018 1357   RBC  4.40 12/01/2018 1357   HGB 14.4 12/01/2018 1357   HCT 42.0 12/01/2018 1357   PLT 243.0 12/01/2018 1357   MCV 95.4 12/01/2018 1357   MCH 33.0 10/24/2018 1630   MCHC 34.4 12/01/2018 1357   RDW 13.0 12/01/2018 1357   LYMPHSABS 1.9 12/01/2018 1357   MONOABS 0.4 12/01/2018 1357   EOSABS 0.1 12/01/2018 1357   BASOSABS 0.0 12/01/2018 1357    BMET    Component Value Date/Time   NA 139 12/01/2018 1357   K 3.9 12/01/2018 1357   CL 104 12/01/2018 1357   CO2 28 12/01/2018 1357   GLUCOSE 99 12/01/2018 1357   BUN 15 12/01/2018 1357   CREATININE 0.95 12/01/2018 1357   CREATININE 1.04 10/24/2018 1630   CALCIUM 9.5 12/01/2018 1357   GFRNONAA >60 09/15/2018 1330   GFRAA >60 09/15/2018 1330    BNP    Component Value Date/Time   BNP 32 10/24/2018 1630    ProBNP No results found for: PROBNP  Imaging: Dg Chest 2 View  Result Date: 12/08/2018 CLINICAL DATA:  Persistent cough EXAM: CHEST - 2 VIEW COMPARISON:  10/24/2018 FINDINGS: The heart size and mediastinal contours are within normal limits. Mild diffuse interstitial pulmonary opacity. Disc degenerative disease of the thoracic spine. IMPRESSION: Mild, diffuse interstitial pulmonary opacity, potentially in keeping with residual nonspecific infection or edema. No focal airspace opacity. Electronically Signed   By: Eddie Candle M.D.   On: 12/08/2018 12:35      No  flowsheet data found.  No results found for: NITRICOXIDE      Assessment & Plan:   Upper airway cough syndrome Chronic cough since COVID-19 diagnosis. Suspect this is a post viral cough triggered by possible postnasal drainage and or reflux.  We will continue on aggressive regimen for cough suppression, trigger prevention with GERD and chronic rhinitis.  Chest x-ray today does show some increased interstitial markings.  Will check a high-resolution CT chest.  Patient did not have any acute abnormalities on her initial 2 chest x-rays during acute COVID-19 and post COVID-19 infection.  She was able to stay at home with conservative therapy and did not have known COVID-19 pneumonia.  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 Unable to perform PFTs due to severe coughing.  Pending high-resolution CT chest could consider adding Neurontin for chronic cough.  Plan  Patient Instructions  Chest xray today .  Symbicort 2 puffs Twice daily  With spacer , rinse after use.  Delsym 2 tsp Twice daily  For cough  Tessalon Three times a day  For cough  Tramadol 50mg  1-2 every 4 hr as needed for severe cough , may make you sleep  Chlorpheniramine 4mg  2 At bedtime  .  May use extra Chlorpheniramine 4mg  every 4hr as needed for drainage , throat clearing, use with caution as may make you sleepy.  Allegra 180mg  daily in am .  Voice rest , sips of water to soothe throat  Avoid throat clearing. No singing .  Continue on Protonix and Pepcid.  Albuterol Neb every 4hr as needed for wheezing /shortness of breath .  Follow up with ENT as planned  Follow up in  2-3 weeks with Dr. Melvyn Novas  Or Keison Glendinning NP and As needed   Please contact office for sooner follow up if symptoms do not improve or worsen or seek emergency care        Extrinsic asthma Continue trial of Symbicort. Hold on additional  steroids at this time.  Albuterol as needed     Rexene Edison, NP  12/08/2018

## 2018-12-10 ENCOUNTER — Ambulatory Visit (INDEPENDENT_AMBULATORY_CARE_PROVIDER_SITE_OTHER)
Admission: RE | Admit: 2018-12-10 | Discharge: 2018-12-10 | Disposition: A | Discharge status: Home / Self Care | Payer: BC Managed Care – PPO | Source: Ambulatory Visit | Attending: Adult Health | Admitting: Adult Health

## 2018-12-10 ENCOUNTER — Other Ambulatory Visit: Payer: Self-pay

## 2018-12-10 ENCOUNTER — Telehealth: Payer: Self-pay | Admitting: Adult Health

## 2018-12-10 DIAGNOSIS — J849 Interstitial pulmonary disease, unspecified: Secondary | ICD-10-CM | POA: Diagnosis not present

## 2018-12-10 DIAGNOSIS — R0602 Shortness of breath: Secondary | ICD-10-CM | POA: Diagnosis not present

## 2018-12-10 NOTE — Telephone Encounter (Signed)
Received PA request from [facility] for patient's Symbicort 41mcg. Started to attempt the PA but received a message stating if the patient had not tried and failed any of the alternatives, the PA would be denied.   Called and spoke with the patient to see if she had tried any other inhalers. She stated just the Advair and albuterol. She also stated that she was able to get the inhaler on 12/4 without any issues. Advised her that I would go ahead and shred the PA request. She stated that she hasn't been able to tell a difference since starting the medication.   Nothing further needed at time of call.

## 2018-12-11 DIAGNOSIS — R490 Dysphonia: Secondary | ICD-10-CM | POA: Diagnosis not present

## 2018-12-22 ENCOUNTER — Ambulatory Visit: Payer: BC Managed Care – PPO | Admitting: Adult Health

## 2018-12-22 ENCOUNTER — Other Ambulatory Visit: Payer: Self-pay

## 2018-12-22 ENCOUNTER — Encounter: Payer: Self-pay | Admitting: Adult Health

## 2018-12-22 DIAGNOSIS — J4521 Mild intermittent asthma with (acute) exacerbation: Secondary | ICD-10-CM

## 2018-12-22 DIAGNOSIS — R05 Cough: Secondary | ICD-10-CM | POA: Diagnosis not present

## 2018-12-22 DIAGNOSIS — R058 Other specified cough: Secondary | ICD-10-CM

## 2018-12-22 MED ORDER — PREDNISONE 10 MG PO TABS: ORAL_TABLET | ORAL | 0 refills | Status: DC

## 2018-12-22 NOTE — Assessment & Plan Note (Signed)
Upper airway cough syndrome-post viral cough with COVID-19 Unfortunately patient has been unresponsive to outpatient aggressive treatment regimen. Work-up has shown negative D-dimer, high-resolution CT chest negative for ILD. Unable to get PFTs due to severe coughing fits.  CBC without elevated eosinophils Referral to ENT with upper airway exam-report per patient as irritated .  Has been referred to speech therapy Patient is to continue on aggressive cough suppression regimen, GERD and rhinitis prevention regimen Voice rest. We will give a short course of prednisone to help with severe coughing paroxysms along with her above regimen  Plan  Patient Instructions  Prednisone taper over next week.  Go for Speech evaluation  Symbicort 2 puffs Twice daily  With spacer , rinse after use.  Delsym 2 tsp Twice daily  For cough  Tessalon Three times a day  For cough  Tramadol 50mg  1-2 every 4 hr as needed for severe cough , may make you sleep  Chlorpheniramine 4mg  2 At bedtime  .  May use extra Chlorpheniramine 4mg  every 4hr as needed for drainage , throat clearing, use with caution as may make you sleepy.  Allegra 180mg  daily in am .  Voice rest , sips of water to soothe throat  Avoid throat clearing. No singing .  Continue on Protonix and Pepcid.  Albuterol Neb every 4hr as needed for wheezing /shortness of breath.  Follow up in  6  weeks with Dr. Melvyn Novas  Or Deaisa Merida NP and As needed   Please contact office for sooner follow up if symptoms do not improve or worsen or seek emergency care

## 2018-12-22 NOTE — Assessment & Plan Note (Signed)
Possible cough variant asthma-continue on Symbicort currently No improvement on return , consider discontinuation

## 2018-12-22 NOTE — Progress Notes (Signed)
@Patient  ID: Cassie [name], female    DOB: July 21, 1970, 48 y.o.   MRN: [id number]  Chief Complaint  Patient presents with  . Follow-up    Cough     Referring provider: Ria Bush, MD  HPI: 48 year old female with remote smoking history seen for pulmonary consult November 21, 2018 for ongoing cough for 3 months after COVID-19 infection  TEST/EVENTS :  Chest x-ray September 15, 2018 clear lungs Chest x-ray October 24, 2018 clear lungs  December 01, 2018 D-dimer less than 0.50,CBC unrevealing eosinophils okay  12/22/2018 Follow up : Chronic cough  Patient returns for a 2-week follow-up.  Patient has had an ongoing cough since August 2020 after she tested positive for COVID-19.  She says prior to her COVID-19 infection she was in good state of health with no pulmonary problems.  She says shortly after diagnosis she developed a severe cough that has been persistent ever since.  She has had loss of voice.  She is been seen by multiple providers and given prednisone and antibiotics on several different occasions.  She was started on Advair with no perceived benefit. Symptoms seem to wax and wane.  She has never been cough free since initial diagnosis.  She has had some help with albuterol.  She is been placed on aggressive reflux regimen with Protonix and Pepcid.  Lab work showed a negative D-dimer.  CBC was unremarkable.  Eosinophils were normal.  Chest x-ray September 14 and October 23 showed clear lungs.  She has been placed on an aggressive cough regimen with Delsym, Tessalon Perles, tramadol and Chlortab's.  High-resolution CT chest showed no interstitial lung disease. Mild air trapping.  A benign 5 mm right lower lobe nodule no significant changes 2018. Patient was placed on Symbicort.  And referred to ENT.  Patient says she was seen by ENT.  Has been referred to speech therapy.  Since last visit patient is feeling the same and cough is no better despite using delsym , tessalon  and tramadol , GERD tx and allegra/chlor tabs.  No fever, chest pain . Orthopnea, edema.  Says changes in temps and talking aggravate cough .     Allergies  Allergen Reactions  . Fish Allergy Anaphylaxis, Hives and Swelling  . Shellfish Allergy Anaphylaxis, Hives and Swelling  . Chocolate     migraines  . Penicillins Rash and Other (See Comments)    Parents are allergic Has patient had a PCN reaction causing immediate rash, facial/tongue/throat swelling, SOB or lightheadedness with hypotension: Yes Has patient had a PCN reaction causing severe rash involving mucus membranes or skin necrosis: Unk Has patient had a PCN reaction that required hospitalization: Unk Has patient had a PCN reaction occurring within the last 10 years: No If all of the above answers are "NO", then may proceed with Cephalosporin use.No   . Tape Rash    Immunization History  Administered Date(s) Administered  . Td 04/16/2007  . Tdap 12/20/2017    Past Medical History:  Diagnosis Date  . Allergy   . Asthma   . Cardiac disease   . Diffuse cystic mastopathy   . GERD (gastroesophageal reflux disease)   . Guttate psoriasis   . H/O syncope    cards work-up WNL (Hochrein) thought 2/2 overmedication vs arrhythmias  . Hx of endometriosis    hysterectomy  . Hx of migraines    occasional; improved since hysterectomy  . Labral tear of shoulder 02/2014   R, pending surgery Tamera Punt)  .  Rosacea    mild    Tobacco History: Social History   Tobacco Use  Smoking Status Former Smoker  . Quit date: 02/21/2007  . Years since quitting: 11.8  Smokeless Tobacco Never Used   Counseling given: Not Answered   Outpatient Medications Prior to Visit  Medication Sig Dispense Refill  . albuterol (PROVENTIL) (2.5 MG/3ML) 0.083% nebulizer solution Take 3 mLs (2.5 mg total) by nebulization every 6 (six) hours as needed for wheezing or shortness of breath. 150 mL 0  . benzonatate (TESSALON) 200 MG capsule Take 1  capsule (200 mg total) by mouth 3 (three) times daily as needed for cough. 45 capsule 3  . budesonide-formoterol (SYMBICORT) 80-4.5 MCG/ACT inhaler Inhale 2 puffs into the lungs 2 (two) times daily. 1 Inhaler 5  . famotidine (PEPCID) 20 MG tablet One after supper 30 tablet 11  . pantoprazole (PROTONIX) 40 MG tablet Take 1 tablet (40 mg total) by mouth daily. Take 30-60 min before first meal of the day 30 tablet 2   No facility-administered medications prior to visit.     Review of Systems:   Constitutional:   No  weight loss, night sweats,  Fevers, chills, fatigue, or  lassitude.  HEENT:   No headaches,  Difficulty swallowing,  Tooth/dental problems, or  Sore throat,                No sneezing, itching, ear ache,  +nasal congestion, post nasal drip,   CV:  No chest pain,  Orthopnea, PND, swelling in lower extremities, anasarca, dizziness, palpitations, syncope.   GI  No heartburn, indigestion, abdominal pain, nausea, vomiting, diarrhea, change in bowel habits, loss of appetite, bloody stools.   Resp:   No chest wall deformity  Skin: no rash or lesions.  GU: no dysuria, change in color of urine, no urgency or frequency.  No flank pain, no hematuria   MS:  No joint pain or swelling.  No decreased range of motion.  No back pain.    Physical Exam  BP 124/72 (BP Location: Left Arm, Cuff Size: Normal)   Pulse 86   Temp 98.4 F (36.9 C) (Temporal)   Ht 5\' 5"  (1.651 m)   Wt 201 lb (91.2 kg)   LMP 01/27/2000   SpO2 96% Comment: RA  BMI 33.45 kg/m   GEN: A/Ox3; pleasant , NAD, well nourished    HEENT:  [place]/AT,    NOSE-clear, THROAT-clear, no lesions, no postnasal drip or exudate noted.   NECK:  Supple w/ fair ROM; no JVD; normal carotid impulses w/o bruits; no thyromegaly or nodules palpated; no lymphadenopathy.    RESP  Clear  P & A; w/o, wheezes/ rales/ or rhonchi. no accessory muscle use, no dullness to percussion + cough throughout entire visit   CARD:  RRR, no m/r/g, no  peripheral edema, pulses intact, no cyanosis or clubbing.  GI:   Soft & nt; nml bowel sounds; no organomegaly or masses detected.   Musco: Warm bil, no deformities or joint swelling noted.   Neuro: alert, no focal deficits noted.    Skin: Warm, no lesions or rashes    Lab Results:  CBC    Component Value Date/Time   WBC 5.4 12/01/2018 1357   RBC 4.40 12/01/2018 1357   HGB 14.4 12/01/2018 1357   HCT 42.0 12/01/2018 1357   PLT 243.0 12/01/2018 1357   MCV 95.4 12/01/2018 1357   MCH 33.0 10/24/2018 1630   MCHC 34.4 12/01/2018 1357   RDW 13.0  12/01/2018 1357   LYMPHSABS 1.9 12/01/2018 1357   MONOABS 0.4 12/01/2018 1357   EOSABS 0.1 12/01/2018 1357   BASOSABS 0.0 12/01/2018 1357    BMET    Component Value Date/Time   NA 139 12/01/2018 1357   K 3.9 12/01/2018 1357   CL 104 12/01/2018 1357   CO2 28 12/01/2018 1357   GLUCOSE 99 12/01/2018 1357   BUN 15 12/01/2018 1357   CREATININE 0.95 12/01/2018 1357   CREATININE 1.04 10/24/2018 1630   CALCIUM 9.5 12/01/2018 1357   GFRNONAA >60 09/15/2018 1330   GFRAA >60 09/15/2018 1330    BNP    Component Value Date/Time   BNP 32 10/24/2018 1630    ProBNP No results found for: PROBNP  Imaging: DG Chest 2 View  Result Date: 12/08/2018 CLINICAL DATA:  Persistent cough EXAM: CHEST - 2 VIEW COMPARISON:  10/24/2018 FINDINGS: The heart size and mediastinal contours are within normal limits. Mild diffuse interstitial pulmonary opacity. Disc degenerative disease of the thoracic spine. IMPRESSION: Mild, diffuse interstitial pulmonary opacity, potentially in keeping with residual nonspecific infection or edema. No focal airspace opacity. Electronically Signed   By: Eddie Candle M.D.   On: 12/08/2018 12:35   CT Chest High Resolution  Result Date: 12/10/2018 CLINICAL DATA:  History of COVID 19 in August, persistent hoarseness, cough, shortness of breath EXAM: CT CHEST WITHOUT CONTRAST TECHNIQUE: Multidetector CT imaging of the chest was  performed following the standard protocol without intravenous contrast. High resolution imaging of the lungs, as well as inspiratory and expiratory imaging, was performed. COMPARISON:  CT chest, coronary artery scoring, 02/20/2016 FINDINGS: Cardiovascular: No significant vascular findings. Normal heart size. No pericardial effusion. Mediastinum/Nodes: No enlarged mediastinal, hilar, or axillary lymph nodes. Thyroid gland, trachea, and esophagus demonstrate no significant findings. Lungs/Pleura: No evidence of fibrotic interstitial lung disease. Mild, lobular air trapping on expiratory phase imaging. Stable, benign 5 mm ground-glass nodule of the azygoesophageal recess of the right lower lobe (series 4, image 142). No pleural effusion or pneumothorax. Upper Abdomen: No acute abnormality. Musculoskeletal: No chest wall mass or suspicious bone lesions identified. IMPRESSION: 1. No evidence of fibrotic interstitial lung disease. 2. Mild, lobular air trapping on expiratory phase imaging, suggestive of small airways disease. Electronically Signed   By: Eddie Candle M.D.   On: 12/10/2018 15:44      No flowsheet data found.  No results found for: NITRICOXIDE      Assessment & Plan:   No problem-specific Assessment & Plan notes found for this encounter.     Rexene Edison, NP 12/22/2018

## 2018-12-22 NOTE — Patient Instructions (Addendum)
Prednisone taper over next week.  Go for Speech evaluation  Symbicort 2 puffs Twice daily  With spacer , rinse after use.  Delsym 2 tsp Twice daily  For cough  Tessalon Three times a day  For cough  Tramadol 50mg  1-2 every 4 hr as needed for severe cough , may make you sleep  Chlorpheniramine 4mg  2 At bedtime  .  May use extra Chlorpheniramine 4mg  every 4hr as needed for drainage , throat clearing, use with caution as may make you sleepy.  Allegra 180mg  daily in am .  Voice rest , sips of water to soothe throat  Avoid throat clearing. No singing .  Continue on Protonix and Pepcid.  Albuterol Neb every 4hr as needed for wheezing /shortness of breath.  Follow up in  6  weeks with Dr. Melvyn Novas  Or Davarious Tumbleson NP and As needed   Please contact office for sooner follow up if symptoms do not improve or worsen or seek emergency care

## 2018-12-30 ENCOUNTER — Ambulatory Visit: Payer: BC Managed Care – PPO | Attending: Unknown Physician Specialty | Admitting: Speech Pathology

## 2018-12-30 ENCOUNTER — Other Ambulatory Visit: Payer: Self-pay

## 2018-12-30 DIAGNOSIS — R490 Dysphonia: Secondary | ICD-10-CM | POA: Diagnosis not present

## 2018-12-31 ENCOUNTER — Other Ambulatory Visit: Payer: Self-pay

## 2018-12-31 ENCOUNTER — Encounter: Payer: Self-pay | Admitting: Speech Pathology

## 2018-12-31 NOTE — Therapy (Signed)
[place] MAIN [facility] SERVICES [address] [place], [place], [address] Phone: [phone]   Fax:  [phone]  Speech Language Pathology Evaluation  Patient Details  Name: Cassie [name] MRN: [id number] Date of Birth: 21-Mar-1970 Referring Provider (SLP): Dr. Tami Ribas   Encounter Date: 12/30/2018  End of Session - 12/31/18 1310    Visit Number  1    Number of Visits  17    Date for SLP Re-Evaluation  02/24/19    Authorization Type  [employer]    Authorization Time Period  Start 12/30/2018    Authorization - Visit Number  1    Authorization - Number of Visits  10    SLP Start Time  1300    SLP Stop Time   1400    SLP Time Calculation (min)  60 min    Activity Tolerance  Patient tolerated treatment well       Past Medical History:  Diagnosis Date  . Allergy   . Asthma   . Cardiac disease   . Diffuse cystic mastopathy   . GERD (gastroesophageal reflux disease)   . Guttate psoriasis   . H/O syncope    cards work-up WNL (Hochrein) thought 2/2 overmedication vs arrhythmias  . Hx of endometriosis    hysterectomy  . Hx of migraines    occasional; improved since hysterectomy  . Labral tear of shoulder 02/2014   R, pending surgery Tamera Punt)  . Rosacea    mild    Past Surgical History:  Procedure Laterality Date  . BREAST BIOPSY  2010   left; core biopsy  . BREAST EXCISIONAL BIOPSY Bilateral    multiple bilateral  . BREAST MASS EXCISION  2008  . BREAST SURGERY Right    excision of lesion  . CARDIAC CATHETERIZATION  2007  . CHOLECYSTECTOMY  2008  . COLONOSCOPY  2006   WNL Nicolasa Ducking)  . COLONOSCOPY  06/2013   mod diverticulosis, rpt 10 yrs Henrene Pastor)  . LAPAROSCOPY  1994   endometriosis  . TONSILLECTOMY  1994  . TONSILLECTOMY AND ADENOIDECTOMY  1995  . TOTAL ABDOMINAL HYSTERECTOMY     Dr Rayford Halsted for endometriosis    There were no vitals filed for this visit.      SLP Evaluation [facility] - 12/31/18 0001      SLP Visit  Information   SLP Received On  12/30/18    Referring Provider (SLP)  Dr. Tami Ribas    Onset Date  August 2020    Medical Diagnosis  Dysphonia      Subjective   Subjective  The patient is very disturbed by the chronic cough and poor vocal quality.    Patient/Family Stated Goal  Stop coughing and sound "normal"      General Information   HPI  48 year old woman S/P Covid-20 August 2018 with ongoing cough since the infection.  Patient was seen by Dr. Tami Ribas with findings including mild vocal fold bowing, good mobility, and intermittent paradoxical vocal fold movement (PVFM).      Prior Functional Status   Cognitive/Linguistic Baseline  Within functional limits      Oral Motor/Sensory Function   Overall Oral Motor/Sensory Function  Appears within functional limits for tasks assessed      Motor Speech   Overall Motor Speech  Appears within functional limits for tasks assessed      Standardized Assessments   Standardized Assessments   Other Assessment   Perceptual Voice Evaluation       Perceptual Voice  Evaluation Voice checklist: . Health risks: History of COVID-19, seasonal allergies  . Characteristic voice use: patient states she does talk a lot for her job.  . Environmental risks: no significant risks at home, sensitive to smells and changes in temperature . Misuse: speaking without good breath support . Abuse: chronic cough . Vocal characteristics: periodic aphonia, high/squeaky phonation, limited voice range, reduced respiratory support/control Maximum phonation time for sustained "ah": 2 seconds Average fundamental frequency during sustained "ah": cannot sustain phonation long enough to determine Habitual pitch: High Average time patient was able to sustain /s/: 6 seconds Average time patient was able to sustain /z/: 4 seconds s/z ratio : 1.5 Visi-Pitch: Multi-Dimensional Voice Program (MDVP)  MDVPT extracts objective quantitative values (Relative Average Perturbation,  Shimmer, Voice Turbulence Index, and Noise to Harmonic Ratio) on sustained phonation, which are displayed graphically and numerically in comparison to a built-in normative database.  The patient cannot sustain voiced sound long enough to get data. Education: Patient instructed in extrinsic laryngeal muscle stretches, abdominal breathing, breath support exercises, and initiated flow phonation treatment (constant airflow, unarticulated and articulated).   SLP Education - 12/31/18 1309    Education Details  Results and recommendations    Person(s) Educated  Patient    Methods  Explanation    Comprehension  Verbalized understanding         SLP Long Term Goals - 12/31/18 1312      SLP LONG TERM GOAL #1   Title  The patient will demonstrate independent understanding of vocal hygiene concepts and extrinsic laryngeal muscle stretches.    Time  8    Period  Weeks    Status  New    Target Date  02/24/19      SLP LONG TERM GOAL #2   Title  The patient will be independent for abdominal breathing and breath support exercises.    Time  8    Period  Weeks    Status  New    Target Date  02/24/19      SLP LONG TERM GOAL #3   Title  The patient will demonstrate independent understanding of cough rescue and distraction techniques.    Time  8    Period  Weeks    Status  New    Target Date  02/24/19      SLP LONG TERM GOAL #4   Title  The patient will minimize vocal tension via flow phonation therapy (or comparable technique) with min SLP cues with 80% accuracy.    Time  8    Period  Weeks    Status  New    Target Date  02/24/19      SLP LONG TERM GOAL #5   Title  The patient will maintain relaxed phonation / oral resonance for paragraph length recitation with 80% accuracy.    Time  8    Period  Weeks    Status  New    Target Date  02/24/19       Plan - 12/31/18 1311    Clinical Impression Statement  This 48 year old woman under the care of Dr. Tami Ribas, with mild vocal fold bowing,  good mobility, and intermittent paradoxical vocal fold movement (PVFM), is presenting with severe dysphonia.  The patient demonstrates intermittent aphonia, high/squeaky phonation, limited voice range, reduced respiratory support/control, strained/tense phonation, limited pitch range, vocal fatigue, and laryngeal tension. She will benefit from voice therapy for education, to improve breath support, improve tone focus, promote easy flow phonation, and  learn techniques to increase loudness and decrease pitch without strain.    Speech Therapy Frequency  2x / week    Duration  Other (comment)   8 weeks   Treatment/Interventions  Other (comment);Patient/family education   Voice therapy   Potential to Achieve Goals  Good    Potential Considerations  Ability to learn/carryover information;Previous level of function;Co-morbidities;Severity of impairments;Cooperation/participation level;Medical prognosis;Family/community support    SLP Home Exercise Plan  Provided    Consulted and Agree with Plan of Care  Patient       Patient will benefit from skilled therapeutic intervention in order to improve the following deficits and impairments:   Dysphonia - Plan: SLP plan of care cert/re-cert    Problem List Patient Active Problem List   Diagnosis Date Noted  . Upper airway cough syndrome 11/21/2018  . Urinary frequency 10/24/2018  . Bronchitis due to COVID-19 virus 09/09/2018  . Frontal headache 07/14/2018  . Vitamin D deficiency 12/12/2017  . Floaters, bilateral 11/08/2017  . Pre-syncope 11/08/2017  . Stressful life events affecting family and household 11/08/2017  . Chest pain at rest 02/09/2016  . Extrinsic asthma 01/20/2016  . Labral tear of shoulder 02/01/2014  . Bowel habit changes 04/28/2013  . Obesity, Class I, BMI 30-34.9 01/09/2013  . Diffuse cystic mastopathy 08/18/2012  . Family history of breast cancer 08/18/2012  . Healthcare maintenance 02/27/2011  . Family history of melanoma  02/27/2011  . SYNCOPE, HX OF 08/11/2009  . BREAST MASS, HX OF 08/11/2009   Leroy Sea, MS/CCC- SLP  Lou Miner 12/31/2018, 1:17 PM  [place] [facility] SERVICES [address] [place], [place], [address] Phone: [phone]   Fax:  [phone]  Name: Cassie [name] MRN: [id number] Date of Birth: Jun 08, 1970

## 2019-01-01 ENCOUNTER — Encounter: Payer: Self-pay | Admitting: Speech Pathology

## 2019-01-01 ENCOUNTER — Other Ambulatory Visit: Payer: Self-pay

## 2019-01-01 ENCOUNTER — Ambulatory Visit: Payer: BC Managed Care – PPO | Admitting: Speech Pathology

## 2019-01-01 DIAGNOSIS — R490 Dysphonia: Secondary | ICD-10-CM

## 2019-01-01 NOTE — Therapy (Signed)
[place] MAIN [facility] SERVICES [address] [place], [place], [address] Phone: [phone]   Fax:  [phone]  Speech Language Pathology Treatment  Patient Details  Name: Cassie [name] MRN: [id number] Date of Birth: 08/30/1970 Referring Provider (SLP): Dr. Tami Ribas   Encounter Date: 01/01/2019  End of Session - 01/01/19 1444    Visit Number  2    Number of Visits  17    Date for SLP Re-Evaluation  02/24/19    Authorization Type  BCBS    Authorization Time Period  Start 12/30/2018    Authorization - Visit Number  2    Authorization - Number of Visits  10    SLP Start Time  1300    SLP Stop Time   1345    SLP Time Calculation (min)  45 min    Activity Tolerance  Patient tolerated treatment well       Past Medical History:  Diagnosis Date  . Allergy   . Asthma   . Cardiac disease   . Diffuse cystic mastopathy   . GERD (gastroesophageal reflux disease)   . Guttate psoriasis   . H/O syncope    cards work-up WNL (Hochrein) thought 2/2 overmedication vs arrhythmias  . Hx of endometriosis    hysterectomy  . Hx of migraines    occasional; improved since hysterectomy  . Labral tear of shoulder 02/2014   R, pending surgery Tamera Punt)  . Rosacea    mild    Past Surgical History:  Procedure Laterality Date  . BREAST BIOPSY  2010   left; core biopsy  . BREAST EXCISIONAL BIOPSY Bilateral    multiple bilateral  . BREAST MASS EXCISION  2008  . BREAST SURGERY Right    excision of lesion  . CARDIAC CATHETERIZATION  2007  . CHOLECYSTECTOMY  2008  . COLONOSCOPY  2006   WNL Nicolasa Ducking)  . COLONOSCOPY  06/2013   mod diverticulosis, rpt 10 yrs Henrene Pastor)  . LAPAROSCOPY  1994   endometriosis  . TONSILLECTOMY  1994  . TONSILLECTOMY AND ADENOIDECTOMY  1995  . TOTAL ABDOMINAL HYSTERECTOMY     Dr Rayford Halsted for endometriosis    There were no vitals filed for this visit.  Subjective Assessment - 01/01/19 1443    Subjective  The patient is  frustrated at the lingering cough and effect on vocal quality            ADULT SLP TREATMENT - 01/01/19 0001      General Information   Behavior/Cognition  Alert;Cooperative;Pleasant mood    HPI  48 year old woman S/P Covid-20 August 2018 with ongoing cough since the infection.  Patient was seen by Dr. Tami Ribas with findings including mild vocal fold bowing, good mobility, and intermittent paradoxical vocal fold movement (PVFM).       Treatment Provided   Treatment provided  Cognitive-Linquistic      Pain Assessment   Pain Assessment  No/denies pain      Cognitive-Linquistic Treatment   Treatment focused on  Voice    Skilled Treatment  The patient was provided with written and verbal teaching regarding vocal hygiene.  The patient was provided with written and verbal teaching regarding neck, shoulder, tongue, and throat stretches exercises to promote relaxed phonation.   The patient was provided with written and verbal teaching regarding breath support exercises.  The patient was provided with verbal and written Flow phonation therapy: Skill Level One: establish airflow release: Unarticulated: greatest success with sustained fricative, demonstrating longer  duration of unimpeded airflow.  Articulated Skill Level: patient is having difficulty releasing thoracic and laryngeal tension for unvoiced articulated airflow.      Assessment / Recommendations / Plan   Plan  Continue with current plan of care      Progression Toward Goals   Progression toward goals  Progressing toward goals       SLP Education - 01/01/19 1444    Education Details  flow phonation, cough rescue strategies         SLP Long Term Goals - 12/31/18 1312      SLP LONG TERM GOAL #1   Title  The patient will demonstrate independent understanding of vocal hygiene concepts and extrinsic laryngeal muscle stretches.    Time  8    Period  Weeks    Status  New    Target Date  02/24/19      SLP LONG TERM GOAL #2    Title  The patient will be independent for abdominal breathing and breath support exercises.    Time  8    Period  Weeks    Status  New    Target Date  02/24/19      SLP LONG TERM GOAL #3   Title  The patient will demonstrate independent understanding of cough rescue and distraction techniques.    Time  8    Period  Weeks    Status  New    Target Date  02/24/19      SLP LONG TERM GOAL #4   Title  The patient will minimize vocal tension via flow phonation therapy (or comparable technique) with min SLP cues with 80% accuracy.    Time  8    Period  Weeks    Status  New    Target Date  02/24/19      SLP LONG TERM GOAL #5   Title  The patient will maintain relaxed phonation / oral resonance for paragraph length recitation with 80% accuracy.    Time  8    Period  Weeks    Status  New    Target Date  02/24/19       Plan - 01/01/19 1445    Clinical Impression Statement  Patient is inconsistently able to sustain unvoiced airflow without thoracic or laryngeal tensing.    Speech Therapy Frequency  2x / week    Duration  Other (comment)    Treatment/Interventions  Other (comment);Patient/family education    Potential to Achieve Goals  Good    Potential Considerations  Ability to learn/carryover information;Previous level of function;Co-morbidities;Severity of impairments;Cooperation/participation level;Medical prognosis;Family/community support    SLP Home Exercise Plan  Provided    Consulted and Agree with Plan of Care  Patient       Patient will benefit from skilled therapeutic intervention in order to improve the following deficits and impairments:   Dysphonia    Problem List Patient Active Problem List   Diagnosis Date Noted  . Upper airway cough syndrome 11/21/2018  . Urinary frequency 10/24/2018  . Bronchitis due to COVID-19 virus 09/09/2018  . Frontal headache 07/14/2018  . Vitamin D deficiency 12/12/2017  . Floaters, bilateral 11/08/2017  . Pre-syncope 11/08/2017   . Stressful life events affecting family and household 11/08/2017  . Chest pain at rest 02/09/2016  . Extrinsic asthma 01/20/2016  . Labral tear of shoulder 02/01/2014  . Bowel habit changes 04/28/2013  . Obesity, Class I, BMI 30-34.9 01/09/2013  . Diffuse cystic mastopathy 08/18/2012  .  Family history of breast cancer 08/18/2012  . Healthcare maintenance 02/27/2011  . Family history of melanoma 02/27/2011  . SYNCOPE, HX OF 08/11/2009  . BREAST MASS, HX OF 08/11/2009   Leroy Sea, MS/CCC- SLP  Lou Miner 01/01/2019, 2:47 PM  [place] MAIN [facility] SERVICES [address] [place], [place], [address] Phone: [phone]   Fax:  [phone]   Name: Cassie [name] MRN: [id number] Date of Birth: 02/11/1970

## 2019-01-05 ENCOUNTER — Encounter: Payer: Self-pay | Admitting: Speech Pathology

## 2019-01-05 ENCOUNTER — Other Ambulatory Visit: Payer: Self-pay

## 2019-01-05 ENCOUNTER — Ambulatory Visit: Payer: BC Managed Care – PPO | Attending: Unknown Physician Specialty | Admitting: Speech Pathology

## 2019-01-05 ENCOUNTER — Encounter: Payer: Self-pay | Admitting: Family Medicine

## 2019-01-05 ENCOUNTER — Ambulatory Visit (INDEPENDENT_AMBULATORY_CARE_PROVIDER_SITE_OTHER): Payer: BC Managed Care – PPO | Admitting: Family Medicine

## 2019-01-05 VITALS — HR 97 | Temp 98.5°F | Ht 65.0 in | Wt 200.0 lb

## 2019-01-05 DIAGNOSIS — L659 Nonscarring hair loss, unspecified: Secondary | ICD-10-CM | POA: Diagnosis not present

## 2019-01-05 DIAGNOSIS — U071 COVID-19: Secondary | ICD-10-CM

## 2019-01-05 DIAGNOSIS — R490 Dysphonia: Secondary | ICD-10-CM | POA: Diagnosis not present

## 2019-01-05 NOTE — Therapy (Signed)
[place] MAIN [facility] SERVICES [address] [place], [place], [address] Phone: [phone]   Fax:  [phone]  Speech Language Pathology Treatment  Patient Details  Name: Cassie [name] MRN: [id number] Date of Birth: 04-07-1970 Referring Provider (SLP): Dr. Tami Ribas   Encounter Date: 01/05/2019  End of Session - 01/05/19 1612    Visit Number  3    Number of Visits  17    Date for SLP Re-Evaluation  02/24/19    Authorization Type  BCBS    Authorization Time Period  Start 12/30/2018    Authorization - Visit Number  3    Authorization - Number of Visits  10    SLP Start Time  [id number]    SLP Stop Time   1600    SLP Time Calculation (min)  45 min    Activity Tolerance  Patient tolerated treatment well       Past Medical History:  Diagnosis Date  . Allergy   . Asthma   . Cardiac disease   . Diffuse cystic mastopathy   . GERD (gastroesophageal reflux disease)   . Guttate psoriasis   . H/O syncope    cards work-up WNL (Hochrein) thought 2/2 overmedication vs arrhythmias  . Hx of endometriosis    hysterectomy  . Hx of migraines    occasional; improved since hysterectomy  . Labral tear of shoulder 02/2014   R, pending surgery Tamera Punt)  . Rosacea    mild    Past Surgical History:  Procedure Laterality Date  . BREAST BIOPSY  2010   left; core biopsy  . BREAST EXCISIONAL BIOPSY Bilateral    multiple bilateral  . BREAST MASS EXCISION  2008  . BREAST SURGERY Right    excision of lesion  . CARDIAC CATHETERIZATION  2007  . CHOLECYSTECTOMY  2008  . COLONOSCOPY  2006   WNL Nicolasa Ducking)  . COLONOSCOPY  06/2013   mod diverticulosis, rpt 10 yrs Henrene Pastor)  . LAPAROSCOPY  1994   endometriosis  . TONSILLECTOMY  1994  . TONSILLECTOMY AND ADENOIDECTOMY  1995  . TOTAL ABDOMINAL HYSTERECTOMY     Dr Rayford Halsted for endometriosis    There were no vitals filed for this visit.  Subjective Assessment - 01/05/19 1611    Subjective  The patient is  frustrated at the lingering cough and effect on vocal quality            ADULT SLP TREATMENT - 01/05/19 0001      General Information   Behavior/Cognition  Alert;Cooperative;Pleasant mood    HPI  49 year old woman S/P Covid-20 August 2018 with ongoing cough since the infection.  Patient was seen by Dr. Tami Ribas with findings including mild vocal fold bowing, good mobility, and intermittent paradoxical vocal fold movement (PVFM).       Treatment Provided   Treatment provided  Cognitive-Linquistic      Pain Assessment   Pain Assessment  No/denies pain      Cognitive-Linquistic Treatment   Treatment focused on  Voice    Skilled Treatment  The patient was provided with written and verbal teaching regarding vocal hygiene.  The patient was provided with written and verbal teaching regarding neck, shoulder, tongue, and throat stretches exercises to promote relaxed phonation.   The patient was provided with written and verbal teaching regarding breath support exercises.  The patient was provided with verbal and written Flow phonation therapy: Skill Level One: establish airflow release: Unarticulated: greatest success with sustained fricative, demonstrating longer  duration of unimpeded airflow.  Added changes in airflow velocity with sustained fricatives.  Articulated Skill Level: patient is having difficulty releasing thoracic and laryngeal tension for unvoiced articulated airflow.  Phonation: Patient able to sustain hum, /n/ sound, and straw phonation with less tension.  Added pitch changes to straw phonation and resonant voice.      Assessment / Recommendations / Plan   Plan  Continue with current plan of care      Progression Toward Goals   Progression toward goals  Progressing toward goals       SLP Education - 01/05/19 1611    Education Details  flow phonation, straw phonation, resonant voice    Person(s) Educated  Patient    Methods  Explanation    Comprehension  Verbalized  understanding         SLP Long Term Goals - 12/31/18 1312      SLP LONG TERM GOAL #1   Title  The patient will demonstrate independent understanding of vocal hygiene concepts and extrinsic laryngeal muscle stretches.    Time  8    Period  Weeks    Status  New    Target Date  02/24/19      SLP LONG TERM GOAL #2   Title  The patient will be independent for abdominal breathing and breath support exercises.    Time  8    Period  Weeks    Status  New    Target Date  02/24/19      SLP LONG TERM GOAL #3   Title  The patient will demonstrate independent understanding of cough rescue and distraction techniques.    Time  8    Period  Weeks    Status  New    Target Date  02/24/19      SLP LONG TERM GOAL #4   Title  The patient will minimize vocal tension via flow phonation therapy (or comparable technique) with min SLP cues with 80% accuracy.    Time  8    Period  Weeks    Status  New    Target Date  02/24/19      SLP LONG TERM GOAL #5   Title  The patient will maintain relaxed phonation / oral resonance for paragraph length recitation with 80% accuracy.    Time  8    Period  Weeks    Status  New    Target Date  02/24/19       Plan - 01/05/19 1613    Clinical Impression Statement  Patient is inconsistently able to sustain unvoiced airflow without thoracic or laryngeal tensing.  She appears to be hypersensitive to airflow though the glottis; she has been given exercises to desensitize her to changes in airflow.    Speech Therapy Frequency  2x / week    Duration  Other (comment)    Treatment/Interventions  Other (comment);Patient/family education    Potential to Achieve Goals  Good    Potential Considerations  Ability to learn/carryover information;Previous level of function;Co-morbidities;Severity of impairments;Cooperation/participation level;Medical prognosis;Family/community support    SLP Home Exercise Plan  Provided    Consulted and Agree with Plan of Care  Patient        Patient will benefit from skilled therapeutic intervention in order to improve the following deficits and impairments:   Dysphonia    Problem List Patient Active Problem List   Diagnosis Date Noted  . Upper airway cough syndrome 11/21/2018  . Urinary frequency 10/24/2018  .  Bronchitis due to COVID-19 virus 09/09/2018  . Frontal headache 07/14/2018  . Vitamin D deficiency 12/12/2017  . Floaters, bilateral 11/08/2017  . Pre-syncope 11/08/2017  . Stressful life events affecting family and household 11/08/2017  . Chest pain at rest 02/09/2016  . Extrinsic asthma 01/20/2016  . Labral tear of shoulder 02/01/2014  . Bowel habit changes 04/28/2013  . Obesity, Class I, BMI 30-34.9 01/09/2013  . Diffuse cystic mastopathy 08/18/2012  . Family history of breast cancer 08/18/2012  . Healthcare maintenance 02/27/2011  . Family history of melanoma 02/27/2011  . SYNCOPE, HX OF 08/11/2009  . BREAST MASS, HX OF 08/11/2009   Leroy Sea, MS/CCC- SLP  Lou Miner 01/05/2019, 4:14 PM  [place] MAIN [facility] SERVICES [address] [place], [place], [address] Phone: [phone]   Fax:  [phone]   Name: Cassie [name] MRN: [id number] Date of Birth: 06-17-1970

## 2019-01-05 NOTE — Progress Notes (Signed)
Virtual visit completed through [employer]. Due to national recommendations of social distancing due to COVID-19, a virtual visit is felt to be most appropriate for this patient at this time. Reviewed limitations of a virtual visit.   Patient location: home Provider location: [place] at [facility], office If any vitals were documented, they were collected by patient at home unless specified below.    Pulse 97   Temp 98.5 F (36.9 C)   Ht 5\' 5"  (1.651 m)   Wt 200 lb (90.7 kg)   LMP 01/27/2000   SpO2 95%   BMI 33.28 kg/m    CC: ongoing cough, new hair loss post covid Subjective:    Patient ID: Cassie [name], female    DOB: [birth year], 49 y.o.   MRN: [id number]  HPI: ROZAY BOBB is a 49 y.o. female presenting on 01/05/2019 for Cough   Persistent cough, body aches, fatigue with minimal exertion since Covid illness 08/2018. Has been unable to return to work. Followed by pulm, referred to speech therapy. Hoarseness persists. Cough has improved some on current respiratory regimen. Felt best on higher dose prednisone 40mg  for a few days, then as dose decreases, cough, dyspnea, fatigue return. Just completed another prednisone taper.  1+ wk of increasing hair loss noted when brushing and washing hair. Yesterday suddenly noticed clumps of hair coming out of the shower (see [employer]). No redness or irritation of scalp. No changes in soaps, body wash, shampoo, conditioner. No new foods, supplements.   She sees Dr Nicole Kindred.   Wonders about endo eval due to symptoms.      Relevant past medical, surgical, family and social history reviewed and updated as indicated. Interim medical history since our last visit reviewed. Allergies and medications reviewed and updated. Outpatient Medications Prior to Visit  Medication Sig Dispense Refill  . albuterol (PROVENTIL) (2.5 MG/3ML) 0.083% nebulizer solution Take 3 mLs (2.5 mg total) by nebulization every 6 (six) hours as needed for wheezing  or shortness of breath. 150 mL 0  . benzonatate (TESSALON) 200 MG capsule Take 1 capsule (200 mg total) by mouth 3 (three) times daily as needed for cough. 45 capsule 3  . budesonide-formoterol (SYMBICORT) 80-4.5 MCG/ACT inhaler Inhale 2 puffs into the lungs 2 (two) times daily. 1 Inhaler 5  . famotidine (PEPCID) 20 MG tablet One after supper 30 tablet 11  . pantoprazole (PROTONIX) 40 MG tablet Take 1 tablet (40 mg total) by mouth daily. Take 30-60 min before first meal of the day 30 tablet 2  . predniSONE (DELTASONE) 10 MG tablet 4 tabs for 2 days, then 3 tabs for 2 days, 2 tabs for 2 days, then 1 tab for 2 days, then stop 20 tablet 0   No facility-administered medications prior to visit.     Per HPI unless specifically indicated in ROS section below Review of Systems Objective:    Pulse 97   Temp 98.5 F (36.9 C)   Ht 5\' 5"  (1.651 m)   Wt 200 lb (90.7 kg)   LMP 01/27/2000   SpO2 95%   BMI 33.28 kg/m   Wt Readings from Last 3 Encounters:  01/05/19 200 lb (90.7 kg)  12/22/18 201 lb (91.2 kg)  12/08/18 201 lb (91.2 kg)     Physical exam: Gen: alert, NAD, not ill appearing Pulm: speaks in complete sentences, marked hoarseness present Psych: normal mood, normal thought content          Results for orders placed or performed in  visit on 12/01/18  CBC w/Diff  Result Value Ref Range   WBC 5.4 4.0 - 10.5 K/uL   RBC 4.40 3.87 - 5.11 Mil/uL   Hemoglobin 14.4 12.0 - 15.0 g/dL   HCT 42.0 36.0 - 46.0 %   MCV 95.4 78.0 - 100.0 fl   MCHC 34.4 30.0 - 36.0 g/dL   RDW 13.0 11.5 - 15.5 %   Platelets 243.0 150.0 - 400.0 K/uL   Neutrophils Relative % 55.4 43.0 - 77.0 %   Lymphocytes Relative 34.6 12.0 - 46.0 %   Monocytes Relative 7.9 3.0 - 12.0 %   Eosinophils Relative 1.3 0.0 - 5.0 %   Basophils Relative 0.8 0.0 - 3.0 %   Neutro Abs 3.0 1.4 - 7.7 K/uL   Lymphs Abs 1.9 0.7 - 4.0 K/uL   Monocytes Absolute 0.4 0.1 - 1.0 K/uL   Eosinophils Absolute 0.1 0.0 - 0.7 K/uL   Basophils  Absolute 0.0 0.0 - 0.1 K/uL  Basic Metabolic Panel (BMET)  Result Value Ref Range   Sodium 139 135 - 145 mEq/L   Potassium 3.9 3.5 - 5.1 mEq/L   Chloride 104 96 - 112 mEq/L   CO2 28 19 - 32 mEq/L   Glucose, Bld 99 70 - 99 mg/dL   BUN 15 6 - 23 mg/dL   Creatinine, Ser 0.95 0.40 - 1.20 mg/dL   GFR 63.34 >60.00 mL/min   Calcium 9.5 8.4 - 10.5 mg/dL  D-Dimer, Quantitative  Result Value Ref Range   D-Dimer, Quant 0.47 <0.50 mcg/mL FEU   Lab Results  Component Value Date   TSH 1.46 10/24/2018   No results found for: IRON, TIBC, FERRITIN  Assessment & Plan:   Problem List Items Addressed This Visit    Hair loss - Primary    ?prednisone related - she is now off this.  ?telogen effluvium - discussed with patient.  ?covid related.  Rec start biotin and avoid offending agents.  Will update TSH and iron panel.  Will refer to derm for further evaluation.       Relevant Orders   Ambulatory referral to Dermatology   CBC with Differential   TSH   IBC panel   Ferritin   COVID-19 virus infection    Continues struggling with covid related sequelae. Remains out of work.  Appreciate pulm care.           Meds ordered this encounter  Medications  . Biotin 5 MG CAPS    Sig: Take 1 capsule (5 mg total) by mouth daily.    Refill:  0   Orders Placed This Encounter  Procedures  . CBC with Differential    Standing Status:   Future    Standing Expiration Date:   01/06/2020  . TSH    Standing Status:   Future    Standing Expiration Date:   01/06/2020  . IBC panel    Standing Status:   Future    Standing Expiration Date:   01/06/2020  . Ferritin    Standing Status:   Future    Standing Expiration Date:   01/06/2020  . Ambulatory referral to Dermatology    Referral Priority:   Routine    Referral Type:   Consultation    Referral Reason:   Specialty Services Required    Requested Specialty:   Dermatology    Number of Visits Requested:   1    I discussed the assessment and treatment  plan with the patient. The patient was  provided an opportunity to ask questions and all were answered. The patient agreed with the plan and demonstrated an understanding of the instructions. The patient was advised to call back or seek an in-person evaluation if the symptoms worsen or if the condition fails to improve as anticipated.  Follow up plan: No follow-ups on file.  Ria Bush, MD

## 2019-01-06 ENCOUNTER — Encounter: Payer: Self-pay | Admitting: Family Medicine

## 2019-01-06 DIAGNOSIS — L659 Nonscarring hair loss, unspecified: Secondary | ICD-10-CM | POA: Insufficient documentation

## 2019-01-06 MED ORDER — BIOTIN 5 MG PO CAPS: 1.0000 | ORAL_CAPSULE | Freq: Every day | ORAL | 0 refills | Status: AC

## 2019-01-06 NOTE — Assessment & Plan Note (Addendum)
?  prednisone related - she is now off this.  ?telogen effluvium - discussed with patient.  ?covid related.  Rec start biotin and avoid offending agents.  Will update TSH and iron panel.  Will refer to derm for further evaluation.

## 2019-01-06 NOTE — Telephone Encounter (Signed)
Seen yesterday

## 2019-01-06 NOTE — Assessment & Plan Note (Signed)
Continues struggling with covid related sequelae. Remains out of work.  Appreciate pulm care.

## 2019-01-06 NOTE — Telephone Encounter (Signed)
plz call to schedule lab visit only for iron levels with recent hair loss. Known covid 08/2018 with sequelae, ok to come in even if coughing, dyspneic.

## 2019-01-07 NOTE — Telephone Encounter (Signed)
plz schedule derm appt for hair loss.

## 2019-01-08 ENCOUNTER — Ambulatory Visit (HOSPITAL_COMMUNITY)
Admission: EM | Admit: 2019-01-08 | Discharge: 2019-01-08 | Disposition: A | Discharge status: Home / Self Care | Payer: BC Managed Care – PPO | Attending: Urgent Care | Admitting: Urgent Care

## 2019-01-08 ENCOUNTER — Ambulatory Visit: Payer: BC Managed Care – PPO | Admitting: Speech Pathology

## 2019-01-08 ENCOUNTER — Other Ambulatory Visit: Payer: Self-pay

## 2019-01-08 ENCOUNTER — Telehealth: Payer: Self-pay

## 2019-01-08 ENCOUNTER — Encounter (HOSPITAL_COMMUNITY): Payer: Self-pay

## 2019-01-08 DIAGNOSIS — J0100 Acute maxillary sinusitis, unspecified: Secondary | ICD-10-CM | POA: Diagnosis not present

## 2019-01-08 DIAGNOSIS — L659 Nonscarring hair loss, unspecified: Secondary | ICD-10-CM | POA: Diagnosis not present

## 2019-01-08 DIAGNOSIS — J9801 Acute bronchospasm: Secondary | ICD-10-CM | POA: Diagnosis not present

## 2019-01-08 DIAGNOSIS — R05 Cough: Secondary | ICD-10-CM

## 2019-01-08 DIAGNOSIS — Z8616 Personal history of COVID-19: Secondary | ICD-10-CM

## 2019-01-08 DIAGNOSIS — R0982 Postnasal drip: Secondary | ICD-10-CM

## 2019-01-08 DIAGNOSIS — R059 Cough, unspecified: Secondary | ICD-10-CM

## 2019-01-08 MED ORDER — METHYLPREDNISOLONE SODIUM SUCC 125 MG IJ SOLR
INTRAMUSCULAR | Status: AC
  Filled 2019-01-08: qty 2

## 2019-01-08 MED ORDER — METHYLPREDNISOLONE SODIUM SUCC 125 MG IJ SOLR
125.0000 mg | Freq: Once | INTRAMUSCULAR | Status: AC
  Administered 2019-01-08: 20:00:00 125 mg via INTRAMUSCULAR

## 2019-01-08 MED ORDER — AMOXICILLIN-POT CLAVULANATE 875-125 MG PO TABS: 1.0000 | ORAL_TABLET | Freq: Two times a day (BID) | ORAL | 0 refills | Status: DC

## 2019-01-08 NOTE — ED Provider Notes (Signed)
[place]   MRN: [id number] DOB: 11-21-1970  Subjective:   Cassie [name] is a 49 y.o. female presenting for 4 month hx of persistent cough, hair loss, malaise, throat pain, loss of voice. Today she reports her symptoms feel the worst. Has undergone multiple treatments. She is using Delsym, tessalon. Has also used prednisone, just finished a 7 day course. States that she felt her best on the 1st-2nd days of starting her oral steroids. Has been using inhaled Symbicort, nebulized albuterol. Has also undergone one other oral steroid course. She is working with a pulmonologist, ENT and her PCP. Was advised by her PCP to come here due to being unable to see her.  No current facility-administered medications for this encounter.  Current Outpatient Medications:  .  albuterol (PROVENTIL) (2.5 MG/3ML) 0.083% nebulizer solution, Take 3 mLs (2.5 mg total) by nebulization every 6 (six) hours as needed for wheezing or shortness of breath., Disp: 150 mL, Rfl: 0 .  benzonatate (TESSALON) 200 MG capsule, Take 1 capsule (200 mg total) by mouth 3 (three) times daily as needed for cough., Disp: 45 capsule, Rfl: 3 .  Biotin 5 MG CAPS, Take 1 capsule (5 mg total) by mouth daily., Disp: , Rfl: 0 .  budesonide-formoterol (SYMBICORT) 80-4.5 MCG/ACT inhaler, Inhale 2 puffs into the lungs 2 (two) times daily., Disp: 1 Inhaler, Rfl: 5 .  famotidine (PEPCID) 20 MG tablet, One after supper, Disp: 30 tablet, Rfl: 11 .  pantoprazole (PROTONIX) 40 MG tablet, Take 1 tablet (40 mg total) by mouth daily. Take 30-60 min before first meal of the day, Disp: 30 tablet, Rfl: 2 .  predniSONE (DELTASONE) 10 MG tablet, 4 tabs for 2 days, then 3 tabs for 2 days, 2 tabs for 2 days, then 1 tab for 2 days, then stop, Disp: 20 tablet, Rfl: 0   Allergies  Allergen Reactions  . Fish Allergy Anaphylaxis, Hives and Swelling  . Shellfish Allergy Anaphylaxis, Hives and Swelling  . Chocolate     migraines  . Penicillins Rash and  Other (See Comments)    Parents are allergic Has patient had a PCN reaction causing immediate rash, facial/tongue/throat swelling, SOB or lightheadedness with hypotension: Yes Has patient had a PCN reaction causing severe rash involving mucus membranes or skin necrosis: Unk Has patient had a PCN reaction that required hospitalization: Unk Has patient had a PCN reaction occurring within the last 10 years: No If all of the above answers are "NO", then may proceed with Cephalosporin use.No   . Tape Rash    Past Medical History:  Diagnosis Date  . Allergy   . Asthma   . Cardiac disease   . Diffuse cystic mastopathy   . GERD (gastroesophageal reflux disease)   . Guttate psoriasis   . H/O syncope    cards work-up WNL (Hochrein) thought 2/2 overmedication vs arrhythmias  . Hx of endometriosis    hysterectomy  . Hx of migraines    occasional; improved since hysterectomy  . Labral tear of shoulder 02/2014   R, pending surgery Tamera Punt)  . Rosacea    mild     Past Surgical History:  Procedure Laterality Date  . BREAST BIOPSY  2010   left; core biopsy  . BREAST EXCISIONAL BIOPSY Bilateral    multiple bilateral  . BREAST MASS EXCISION  2008  . BREAST SURGERY Right    excision of lesion  . CARDIAC CATHETERIZATION  2007  . CHOLECYSTECTOMY  2008  . COLONOSCOPY  2006  WNL Nicolasa Ducking)  . COLONOSCOPY  06/2013   mod diverticulosis, rpt 10 yrs Henrene Pastor)  . LAPAROSCOPY  1994   endometriosis  . TONSILLECTOMY  1994  . TONSILLECTOMY AND ADENOIDECTOMY  1995  . TOTAL ABDOMINAL HYSTERECTOMY     Dr Rayford Halsted for endometriosis    Family History  Problem Relation Age of Onset  . Skin cancer Mother        melanoma  . Hypertension Mother        also had polio  . Irritable bowel syndrome Mother   . Breast cancer Mother 12  . Coronary artery disease Father        age 58's  . Hypertension Father   . Breast cancer Maternal Grandmother 26  . Stroke Maternal Grandmother   . Other Son         hyperinsulinemic  . Coronary artery disease Paternal Uncle        age 79's  . Skin cancer Sister 21       melanoma - cancer in eye and uterus  . Thyroid disease Sister   . Breast cancer Other        pat. great aunt  . Irritable bowel syndrome Daughter   . Diabetes Maternal Grandfather   . Heart disease Maternal Grandfather   . Diabetes Paternal Uncle   . Epilepsy Son   . Breast cancer Cousin        second cousin  . Pulmonary embolism Other        died from this after MVA  . Heart disease Paternal Grandmother   . Heart disease Paternal Grandfather   . Colon cancer Neg Hx     Social History   Tobacco Use  . Smoking status: Former Smoker    Quit date: 02/21/2007    Years since quitting: 11.8  . Smokeless tobacco: Never Used  Substance Use Topics  . Alcohol use: Yes    Alcohol/week: 0.0 standard drinks    Comment: occas  . Drug use: No    ROS   Objective:   Vitals: BP (!) 153/122 (BP Location: Left Arm)   Pulse (!) 107   Temp 99.2 F (37.3 C) (Oral)   Resp 20   Wt 200 lb (90.7 kg)   LMP 01/27/2000   SpO2 97%   BMI 33.28 kg/m   Physical Exam Constitutional:      General: She is not in acute distress.    Appearance: Normal appearance. She is well-developed. She is not ill-appearing, toxic-appearing or diaphoretic.  HENT:     Head: Normocephalic and atraumatic.     Nose: Nose normal.     Mouth/Throat:     Mouth: Mucous membranes are moist.     Pharynx: No oropharyngeal exudate or posterior oropharyngeal erythema.     Comments: Thick streaks of pnd on pharynx. Eyes:     Extraocular Movements: Extraocular movements intact.     Pupils: Pupils are equal, round, and reactive to light.  Cardiovascular:     Rate and Rhythm: Normal rate and regular rhythm.     Pulses: Normal pulses.     Heart sounds: Normal heart sounds. No murmur. No friction rub. No gallop.   Pulmonary:     Effort: Pulmonary effort is normal. No respiratory distress.     Breath sounds:  Normal breath sounds. No stridor. No wheezing, rhonchi or rales.     Comments: Persistent coughing/bronchospasms during visit. Skin:    General: Skin is warm and dry.  Findings: No rash.  Neurological:     Mental Status: She is alert and oriented to person, place, and time.  Psychiatric:        Mood and Affect: Mood normal.        Behavior: Behavior normal.        Thought Content: Thought content normal.        Judgment: Judgment normal.     Assessment and Plan :   1. Acute non-recurrent maxillary sinusitis   2. Cough   3. Hair loss   4. History of COVID-19   5. Post-nasal drainage   6. Bronchospasm     Patient states that she is prone to sinus infections. She has previously taken Augmentin without any issues. She is also agreeable to a steroid injection to help with her bronchospasms, sinusitis. Counseled patient on potential for adverse effects with medications prescribed/recommended today, ER and return-to-clinic precautions discussed, patient verbalized understanding.    Jaynee Eagles, [place] 01/08/19 1915

## 2019-01-08 NOTE — ED Triage Notes (Signed)
Pt state she has a cough , congestion, and chest discomfort from coughing so much. X this has been since August 2020.  Pt states she has hair loss , its coming out in clumps. X 4 days. Pt states today she feels the worst. Pt was positive with Covid in August.

## 2019-01-08 NOTE — Telephone Encounter (Signed)
Pt said she feels worse today like covid symptoms are back. Pt dx with covid 08/2018.pt is hurting and burning sensation in mid chest, all over body aches, chills, wheezing, SOB and pt took temp 97.9 and pulse ox 95% but pt having trouble making complete sentences. Dr Darnell Level advised did virtual earlier this wk and pt should go to [facility] for eval. Pt voiced understanding and will go to UC.Not sure which UC yet but pt said she would go. FYI to Dr Darnell Level.

## 2019-01-09 ENCOUNTER — Other Ambulatory Visit (INDEPENDENT_AMBULATORY_CARE_PROVIDER_SITE_OTHER): Payer: BC Managed Care – PPO

## 2019-01-09 DIAGNOSIS — L659 Nonscarring hair loss, unspecified: Secondary | ICD-10-CM | POA: Diagnosis not present

## 2019-01-09 LAB — CBC WITH DIFFERENTIAL/PLATELET
Basophils Absolute: 0 10*3/uL (ref 0.0–0.1)
Basophils Relative: 0.3 % (ref 0.0–3.0)
Eosinophils Absolute: 0 10*3/uL (ref 0.0–0.7)
Eosinophils Relative: 0 % (ref 0.0–5.0)
HCT: 41.4 % (ref 36.0–46.0)
Hemoglobin: 14.4 g/dL (ref 12.0–15.0)
Lymphocytes Relative: 5.4 % — ABNORMAL LOW (ref 12.0–46.0)
Lymphs Abs: 0.5 10*3/uL — ABNORMAL LOW (ref 0.7–4.0)
MCHC: 34.7 g/dL (ref 30.0–36.0)
MCV: 95 fl (ref 78.0–100.0)
Monocytes Absolute: 0.2 10*3/uL (ref 0.1–1.0)
Monocytes Relative: 1.8 % — ABNORMAL LOW (ref 3.0–12.0)
Neutro Abs: 8.4 10*3/uL — ABNORMAL HIGH (ref 1.4–7.7)
Neutrophils Relative %: 92.5 % — ABNORMAL HIGH (ref 43.0–77.0)
Platelets: 230 10*3/uL (ref 150.0–400.0)
RBC: 4.36 Mil/uL (ref 3.87–5.11)
RDW: 12.5 % (ref 11.5–15.5)
WBC: 9.1 10*3/uL (ref 4.0–10.5)

## 2019-01-09 LAB — IBC PANEL
Iron: 87 ug/dL (ref 42–145)
Saturation Ratios: 25 % (ref 20.0–50.0)
Transferrin: 249 mg/dL (ref 212.0–360.0)

## 2019-01-09 LAB — FERRITIN: Ferritin: 196.8 ng/mL (ref 10.0–291.0)

## 2019-01-09 LAB — TSH: TSH: 0.34 u[IU]/mL — ABNORMAL LOW (ref 0.35–4.50)

## 2019-01-10 NOTE — Telephone Encounter (Signed)
Seen at [facility]< treated for sinusitis with augmentin course. See recent lab results.

## 2019-01-12 ENCOUNTER — Telehealth: Payer: Self-pay | Admitting: *Deleted

## 2019-01-12 ENCOUNTER — Ambulatory Visit: Payer: BC Managed Care – PPO | Admitting: Speech Pathology

## 2019-01-12 ENCOUNTER — Other Ambulatory Visit: Payer: Self-pay

## 2019-01-12 DIAGNOSIS — R490 Dysphonia: Secondary | ICD-10-CM | POA: Diagnosis not present

## 2019-01-12 NOTE — Telephone Encounter (Signed)
Patient left a voicemail stating that she went to Urgent Care Thursday. Patient stated that she was to call back with an update. Patient stated that she is not any better. Patient stated that she has been checking her pulse oxygen and that has been fine, bur her heart rate has been up. Tried to call patient back and got her voicemail. Advised patient to call the office back.Marland Kitchen

## 2019-01-12 NOTE — Telephone Encounter (Signed)
Patient called back stating that she is not much better since going to the Urgent Care Thursday. Patient stated that her cough may be a little better. Patient stated that she is still having difficulty breathing. Patient stated that her pulse ox has been running between 95-97, but her heart rate has been running 121-135. Patient wants to know what else she should do?

## 2019-01-12 NOTE — Telephone Encounter (Signed)
Since seeing pulm 12/2018, has seen [place] in interim with dx acute sinusitis treated with augmentin 10d course and solumedrol IM.  Current cough regimen is: Delsym, Tessalon Perles, tramadol and chlorpheniramine. Which of these seems to help the most? Could do another prednisone taper, but hesitant given concern over contribution to hair loss. May also consider maxing out symbicort dose.  Will loop pulm back in to see if any further recommendations on their part.

## 2019-01-12 NOTE — Telephone Encounter (Signed)
Spoke with pt relaying Dr. Synthia Innocent message.  Pt verbalizes understanding and states the steroid actually helped her breath better.  However, she is concerned about elevated HR when taking it.  Fyi to Dr. Darnell Level.

## 2019-01-13 ENCOUNTER — Encounter: Payer: Self-pay | Admitting: Speech Pathology

## 2019-01-13 MED ORDER — BUDESONIDE-FORMOTEROL FUMARATE 160-4.5 MCG/ACT IN AERO: 2.0000 | INHALATION_SPRAY | Freq: Two times a day (BID) | RESPIRATORY_TRACT | 3 refills | Status: DC

## 2019-01-13 NOTE — Telephone Encounter (Signed)
Noted  

## 2019-01-13 NOTE — Telephone Encounter (Signed)
Would suggest increasing symbicort to 160 2 puffs twice daily.  Have sent this in to her pharmacy.  Agree want to avoid prednisone if we can.  Any fever?

## 2019-01-13 NOTE — Telephone Encounter (Signed)
Work note sent via mychart. ?

## 2019-01-13 NOTE — Addendum Note (Signed)
Addended by: Ria Bush on: 01/13/2019 08:09 AM   Modules accepted: Orders

## 2019-01-13 NOTE — Telephone Encounter (Signed)
Spoke with pt relaying Dr. Synthia Innocent message.  Pt verbalizes understanding.  Denies fever.  Also, pt took off from work today.  She requests a work note, sent via [place], [place] her today.

## 2019-01-13 NOTE — Therapy (Signed)
[place] MAIN [facility] SERVICES [address] [place], [place], [address] Phone: [phone]   Fax:  [phone]  Speech Language Pathology Treatment  Patient Details  Name: Cassie [name] MRN: [id number] Date of Birth: 09-01-1970 Referring Provider (SLP): Dr. Tami Ribas   Encounter Date: 01/12/2019  End of Session - 01/13/19 0825    Visit Number  4    Number of Visits  17    Date for SLP Re-Evaluation  02/24/19    Authorization Type  [employer]    Authorization Time Period  Start 12/30/2018    Authorization - Visit Number  4    Authorization - Number of Visits  10    SLP Start Time  1600    SLP Stop Time   [id number]    SLP Time Calculation (min)  45 min    Activity Tolerance  Patient tolerated treatment well       Past Medical History:  Diagnosis Date  . Allergy   . Asthma   . Cardiac disease   . Diffuse cystic mastopathy   . GERD (gastroesophageal reflux disease)   . Guttate psoriasis   . H/O syncope    cards work-up WNL (Hochrein) thought 2/2 overmedication vs arrhythmias  . Hx of endometriosis    hysterectomy  . Hx of migraines    occasional; improved since hysterectomy  . Labral tear of shoulder 02/2014   R, pending surgery Tamera Punt)  . Rosacea    mild    Past Surgical History:  Procedure Laterality Date  . BREAST BIOPSY  2010   left; core biopsy  . BREAST EXCISIONAL BIOPSY Bilateral    multiple bilateral  . BREAST MASS EXCISION  2008  . BREAST SURGERY Right    excision of lesion  . CARDIAC CATHETERIZATION  2007  . CHOLECYSTECTOMY  2008  . COLONOSCOPY  2006   WNL Nicolasa Ducking)  . COLONOSCOPY  06/2013   mod diverticulosis, rpt 10 yrs Henrene Pastor)  . LAPAROSCOPY  1994   endometriosis  . TONSILLECTOMY  1994  . TONSILLECTOMY AND ADENOIDECTOMY  1995  . TOTAL ABDOMINAL HYSTERECTOMY     Dr Rayford Halsted for endometriosis    There were no vitals filed for this visit.  Subjective Assessment - 01/13/19 0824    Subjective  The patient is  frustrated at the lingering cough and effect on vocal quality            ADULT SLP TREATMENT - 01/13/19 0001      General Information   Behavior/Cognition  Alert;Cooperative;Pleasant mood    HPI  49 year old woman S/P Covid-20 August 2018 with ongoing cough since the infection.  Patient was seen by Dr. Tami Ribas with findings including mild vocal fold bowing, good mobility, and intermittent paradoxical vocal fold movement (PVFM).       Treatment Provided   Treatment provided  Cognitive-Linquistic      Pain Assessment   Pain Assessment  No/denies pain      Cognitive-Linquistic Treatment   Treatment focused on  Voice    Skilled Treatment  The patient was provided with written and verbal teaching regarding vocal hygiene.  The patient was provided with written and verbal teaching regarding neck, shoulder, tongue, and throat stretches exercises to promote relaxed phonation.   The patient was provided with written and verbal teaching regarding breath support exercises.  The patient was provided with verbal and written Flow phonation therapy: Skill Level One: establish airflow release: Unarticulated: greatest success with sustained fricative, demonstrating longer  duration of unimpeded airflow.  Added changes in airflow velocity with sustained fricatives.  Articulated Skill Level: patient is having difficulty releasing thoracic and laryngeal tension for unvoiced articulated airflow.  Phonation: Patient able to sustain hum, /n/ sound, and straw phonation with less tension.  Added pitch changes to straw phonation and resonant voice.      Assessment / Recommendations / Plan   Plan  Continue with current plan of care      Progression Toward Goals   Progression toward goals  Progressing toward goals       SLP Education - 01/13/19 0824    Education Details  flow phonation, straw phonation, resonant voice    Person(s) Educated  Patient    Methods  Explanation    Comprehension  Verbalized  understanding         SLP Long Term Goals - 12/31/18 1312      SLP LONG TERM GOAL #1   Title  The patient will demonstrate independent understanding of vocal hygiene concepts and extrinsic laryngeal muscle stretches.    Time  8    Period  Weeks    Status  New    Target Date  02/24/19      SLP LONG TERM GOAL #2   Title  The patient will be independent for abdominal breathing and breath support exercises.    Time  8    Period  Weeks    Status  New    Target Date  02/24/19      SLP LONG TERM GOAL #3   Title  The patient will demonstrate independent understanding of cough rescue and distraction techniques.    Time  8    Period  Weeks    Status  New    Target Date  02/24/19      SLP LONG TERM GOAL #4   Title  The patient will minimize vocal tension via flow phonation therapy (or comparable technique) with min SLP cues with 80% accuracy.    Time  8    Period  Weeks    Status  New    Target Date  02/24/19      SLP LONG TERM GOAL #5   Title  The patient will maintain relaxed phonation / oral resonance for paragraph length recitation with 80% accuracy.    Time  8    Period  Weeks    Status  New    Target Date  02/24/19       Plan - 01/13/19 0825    Clinical Impression Statement  Patient is inconsistently able to sustain unvoiced airflow without thoracic or laryngeal tensing.  She appears to be hypersensitive to airflow though the glottis; she has been given exercises to desensitize her to changes in airflow.    Speech Therapy Frequency  2x / week    Duration  Other (comment)    Treatment/Interventions  Other (comment);Patient/family education    Potential to Achieve Goals  Good    Potential Considerations  Ability to learn/carryover information;Previous level of function;Co-morbidities;Severity of impairments;Cooperation/participation level;Medical prognosis;Family/community support    SLP Home Exercise Plan  Provided    Consulted and Agree with Plan of Care  Patient        Patient will benefit from skilled therapeutic intervention in order to improve the following deficits and impairments:   Dysphonia    Problem List Patient Active Problem List   Diagnosis Date Noted  . Hair loss 01/06/2019  . Upper airway cough syndrome 11/21/2018  .  Urinary frequency 10/24/2018  . COVID-19 virus infection 09/09/2018  . Frontal headache 07/14/2018  . Vitamin D deficiency 12/12/2017  . Floaters, bilateral 11/08/2017  . Pre-syncope 11/08/2017  . Stressful life events affecting family and household 11/08/2017  . Chest pain at rest 02/09/2016  . Extrinsic asthma 01/20/2016  . Labral tear of shoulder 02/01/2014  . Bowel habit changes 04/28/2013  . Obesity, Class I, BMI 30-34.9 01/09/2013  . Diffuse cystic mastopathy 08/18/2012  . Family history of breast cancer 08/18/2012  . Healthcare maintenance 02/27/2011  . Family history of melanoma 02/27/2011  . SYNCOPE, HX OF 08/11/2009  . BREAST MASS, HX OF 08/11/2009   Leroy Sea, MS/CCC- SLP  Lou Miner 01/13/2019, 8:26 AM  [place] MAIN [facility] SERVICES [address] [place], [place], [address] Phone: [phone]   Fax:  [phone]   Name: LYLA SKORA MRN: [id number] Date of Birth: [birth year]

## 2019-01-14 ENCOUNTER — Encounter: Payer: Self-pay | Admitting: Family Medicine

## 2019-01-14 NOTE — Telephone Encounter (Signed)
Pt scheduled  

## 2019-01-14 NOTE — Telephone Encounter (Signed)
I'd like schedule patient for OV 12:45pm for EKG - to come in through the back door, call us when she arrives.  Will cc Leafy Ro and Larene Beach as fyi for this complicated patient (covid positive 08/2018, prolonged symptoms since).

## 2019-01-14 NOTE — Telephone Encounter (Signed)
Will you add a 12:45 tomorrow for this pt.  She needs to come to back of bldg at lab entrance and call when she gets here.

## 2019-01-15 ENCOUNTER — Ambulatory Visit (INDEPENDENT_AMBULATORY_CARE_PROVIDER_SITE_OTHER): Payer: BC Managed Care – PPO | Admitting: Family Medicine

## 2019-01-15 ENCOUNTER — Encounter: Payer: Self-pay | Admitting: Family Medicine

## 2019-01-15 ENCOUNTER — Other Ambulatory Visit: Payer: Self-pay

## 2019-01-15 ENCOUNTER — Ambulatory Visit: Payer: BC Managed Care – PPO | Admitting: Speech Pathology

## 2019-01-15 VITALS — BP 138/82 | HR 124 | Temp 98.1°F | Wt 200.0 lb

## 2019-01-15 DIAGNOSIS — R7989 Other specified abnormal findings of blood chemistry: Secondary | ICD-10-CM

## 2019-01-15 DIAGNOSIS — R002 Palpitations: Secondary | ICD-10-CM | POA: Insufficient documentation

## 2019-01-15 DIAGNOSIS — R000 Tachycardia, unspecified: Secondary | ICD-10-CM

## 2019-01-15 DIAGNOSIS — R058 Other specified cough: Secondary | ICD-10-CM

## 2019-01-15 DIAGNOSIS — R05 Cough: Secondary | ICD-10-CM

## 2019-01-15 DIAGNOSIS — L659 Nonscarring hair loss, unspecified: Secondary | ICD-10-CM | POA: Diagnosis not present

## 2019-01-15 LAB — TSH: TSH: 1.62 u[IU]/mL (ref 0.35–4.50)

## 2019-01-15 LAB — COMPREHENSIVE METABOLIC PANEL
ALT: 21 U/L (ref 0–35)
AST: 16 U/L (ref 0–37)
Albumin: 4.8 g/dL (ref 3.5–5.2)
Alkaline Phosphatase: 81 U/L (ref 39–117)
BUN: 12 mg/dL (ref 6–23)
CO2: 30 mEq/L (ref 19–32)
Calcium: 9.6 mg/dL (ref 8.4–10.5)
Chloride: 104 mEq/L (ref 96–112)
Creatinine, Ser: 1.02 mg/dL (ref 0.40–1.20)
GFR: 58.32 mL/min — ABNORMAL LOW (ref >60.00)
Glucose, Bld: 94 mg/dL (ref 70–99)
Potassium: 4.5 mEq/L (ref 3.5–5.1)
Sodium: 140 mEq/L (ref 135–145)
Total Bilirubin: 0.7 mg/dL (ref 0.2–1.2)
Total Protein: 7.1 g/dL (ref 6.0–8.3)

## 2019-01-15 LAB — T4, FREE: Free T4: 0.98 ng/dL (ref 0.60–1.60)

## 2019-01-15 MED ORDER — DILTIAZEM HCL 30 MG PO TABS: 30.0000 mg | ORAL_TABLET | Freq: Four times a day (QID) | ORAL | 0 refills | Status: DC | PRN

## 2019-01-15 NOTE — Progress Notes (Addendum)
This visit was conducted in person.  BP 138/82 (BP Location: Left Arm, Patient Position: Sitting, Cuff Size: Normal)   Pulse (!) 124   Temp 98.1 F (36.7 C) (Temporal)   Wt 200 lb (90.7 kg)   LMP 01/27/2000   SpO2 95%   BMI 33.28 kg/m    CC: tachycardia Subjective:    Patient ID: Cassie [name], female    DOB: November 27, 1970, 49 y.o.   MRN: [id number]  HPI: Cassie [name] is a 49 y.o. female presenting on 01/15/2019 for Tachycardia   See recent mychart encounter and UCC note for details.   Prolonged symptoms since covid-19 infection late 08/2018, ongoing cough, dyspnea, hoarseness, fatigue.  Most recently with new onset hair loss and tachycardia onset last Thursday 01/08/2019. Evaluated at [facility] with dx sinusitis, treated with augmentin course without significant improvement. Lab work for hair loss overall normal, with low normal TSH at 0.34. Presents today for further evaluation of persistent tachycardia, with minimal exertion, can rise to 130s even at rest.   Does endorse fmhx hashimoto's thyroiditis.  Has had heat intolerance and fatigue, no constipation/diarrhea, skin changes.   Derm appt scheduled next week for further evaluation of hair loss.      Relevant past medical, surgical, family and social history reviewed and updated as indicated. Interim medical history since our last visit reviewed. Allergies and medications reviewed and updated. Outpatient Medications Prior to Visit  Medication Sig Dispense Refill  . albuterol (PROVENTIL) (2.5 MG/3ML) 0.083% nebulizer solution Take 3 mLs (2.5 mg total) by nebulization every 6 (six) hours as needed for wheezing or shortness of breath. 150 mL 0  . amoxicillin-clavulanate (AUGMENTIN) 875-125 MG tablet Take 1 tablet by mouth every 12 (twelve) hours. 20 tablet 0  . benzonatate (TESSALON) 200 MG capsule Take 1 capsule (200 mg total) by mouth 3 (three) times daily as needed for cough. 45 capsule 3  . budesonide-formoterol (SYMBICORT)  160-4.5 MCG/ACT inhaler Inhale 2 puffs into the lungs 2 (two) times daily. 1 Inhaler 3  . CHLORPHENIRAMINE-ACETAMINOPHEN PO Take 4 mg by mouth at bedtime.    Marland Kitchen Dextromethorphan Polistirex (DELSYM PO) Take by mouth daily.    . famotidine (PEPCID) 20 MG tablet One after supper 30 tablet 11  . fexofenadine (ALLEGRA) 180 MG tablet Take 180 mg by mouth daily.    . pantoprazole (PROTONIX) 40 MG tablet Take 1 tablet (40 mg total) by mouth daily. Take 30-60 min before first meal of the day 30 tablet 2  . Biotin 5 MG CAPS Take 1 capsule (5 mg total) by mouth daily. (Patient not taking: Reported on 01/15/2019)  0  . predniSONE (DELTASONE) 10 MG tablet 4 tabs for 2 days, then 3 tabs for 2 days, 2 tabs for 2 days, then 1 tab for 2 days, then stop 20 tablet 0  . traMADol (ULTRAM) 50 MG tablet Take 50 mg by mouth daily as needed.    . budesonide-formoterol (SYMBICORT) 80-4.5 MCG/ACT inhaler Inhale 2 puffs into the lungs 2 (two) times daily. 1 Inhaler 5   No facility-administered medications prior to visit.     Per HPI unless specifically indicated in ROS section below Review of Systems Objective:    BP 138/82 (BP Location: Left Arm, Patient Position: Sitting, Cuff Size: Normal)   Pulse (!) 124   Temp 98.1 F (36.7 C) (Temporal)   Wt 200 lb (90.7 kg)   LMP 01/27/2000   SpO2 95%   BMI 33.28 kg/m   Wt Readings  from Last 3 Encounters:  01/15/19 200 lb (90.7 kg)  01/08/19 200 lb (90.7 kg)  01/05/19 200 lb (90.7 kg)    Physical Exam Vitals and nursing note reviewed.  Constitutional:      Appearance: Normal appearance. She is not ill-appearing.  Neck:     Thyroid: No thyromegaly or thyroid tenderness.  Cardiovascular:     Rate and Rhythm: Regular rhythm. Tachycardia present.     Pulses: Normal pulses.     Heart sounds: Normal heart sounds. No murmur.  Pulmonary:     Effort: Pulmonary effort is normal. No respiratory distress.     Breath sounds: No wheezing, rhonchi or rales.     Comments:  Faint scattered rhonchi Neurological:     Mental Status: She is alert.  Psychiatric:     Comments: Understandably tired, frustrated from covid sequelae       Results for orders placed or performed in visit on 01/15/19  TSH  Result Value Ref Range   TSH 1.62 0.35 - 4.50 uIU/mL  T4, Free  Result Value Ref Range   Free T4 0.98 0.60 - 1.60 ng/dL  Comprehensive metabolic panel  Result Value Ref Range   Sodium 140 135 - 145 mEq/L   Potassium 4.5 3.5 - 5.1 mEq/L   Chloride 104 96 - 112 mEq/L   CO2 30 19 - 32 mEq/L   Glucose, Bld 94 70 - 99 mg/dL   BUN 12 6 - 23 mg/dL   Creatinine, Ser 1.02 0.40 - 1.20 mg/dL   Total Bilirubin 0.7 0.2 - 1.2 mg/dL   Alkaline Phosphatase 81 39 - 117 U/L   AST 16 0 - 37 U/L   ALT 21 0 - 35 U/L   Total Protein 7.1 6.0 - 8.3 g/dL   Albumin 4.8 3.5 - 5.2 g/dL   GFR 58.32 (L) >60.00 mL/min   Calcium 9.6 8.4 - 10.5 mg/dL   EKG - sinus tachycardia rate 120s, normal axis, intervals, no acute ST/T changes Assessment & Plan:  This visit occurred during the SARS-CoV-2 public health emergency.  Safety protocols were in place, including screening questions prior to the visit, additional usage of staff PPE, and extensive cleaning of exam room while observing appropriate contact time as indicated for disinfecting solutions.   Problem List Items Addressed This Visit    Upper airway cough syndrome   Tachycardia - Primary    EKG with evidence of sinus tachycardia, ?inappropriate.  With recent low TSH, ?new onset hyperthyroid as cause - will further evaluate with full thyroid panel including fT4 and T3 as well as anti-TPO and anti-TSHR.  If abnormal, low threshold to refer to endo.  For further management of tachycardia will start IR dilt and taper to effect, she will let me know dose needed/day to transition to XR formulation. Want to avoid beta blocker in asthma history thought leading to airway hyperreactivity and covid-related bronchospasm.  I have recommended out  of work over next 2 weeks while we further work up and manage recent tachycardias.       Relevant Orders   EKG 12-Lead (Completed)   TSH (Completed)   T3   T4, Free (Completed)   Comprehensive metabolic panel (Completed)   Low TSH level    Update TFTs.      Relevant Orders   Thyroid Peroxidase Antibodies (TPO) (REFL)   TRAb (TSH Receptor Binding Antibody)   Hair loss    ?telogen effluvium, ?hyperthyroid related. Await further labs.  Upcoming derm eval.  Recent iron panel normal.           Meds ordered this encounter  Medications  . diltiazem (CARDIZEM) 30 MG tablet    Sig: Take 1 tablet (30 mg total) by mouth 4 (four) times daily as needed (heart rate >120).    Dispense:  40 tablet    Refill:  0   Orders Placed This Encounter  Procedures  . TSH  . T3  . T4, Free  . Thyroid Peroxidase Antibodies (TPO) (REFL)  . TRAb (TSH Receptor Binding Antibody)  . Comprehensive metabolic panel  . EKG 12-Lead    Patient Instructions  Start diltiazem 30mg  3-4 times daily as needed for racing heart - may take every 6 hours to start. Monitor effect on heart rate, after a few days let me know how many you're having to take and we will transition to once daily extended release dosing.  Further labs to evaluate thyroid function.  Letter for work provided today.   Follow up plan: No follow-ups on file.  Ria Bush, MD

## 2019-01-15 NOTE — Assessment & Plan Note (Signed)
?  telogen effluvium, ?hyperthyroid related. Await further labs.  Upcoming derm eval.  Recent iron panel normal.

## 2019-01-15 NOTE — Patient Instructions (Signed)
Start diltiazem 30mg  3-4 times daily as needed for racing heart - may take every 6 hours to start. Monitor effect on heart rate, after a few days let me know how many you're having to take and we will transition to once daily extended release dosing.  Further labs to evaluate thyroid function.  Letter for work provided today.

## 2019-01-15 NOTE — Assessment & Plan Note (Addendum)
EKG with evidence of sinus tachycardia, ?inappropriate.  With recent low TSH, ?new onset hyperthyroid as cause - will further evaluate with full thyroid panel including fT4 and T3 as well as anti-TPO and anti-TSHR.  If abnormal, low threshold to refer to endo.  For further management of tachycardia will start IR dilt and taper to effect, she will let me know dose needed/day to transition to XR formulation. Want to avoid beta blocker in asthma history thought leading to airway hyperreactivity and covid-related bronchospasm.  I have recommended out of work over next 2 weeks while we further work up and manage recent tachycardias.

## 2019-01-15 NOTE — Assessment & Plan Note (Signed)
Update TFTs ° °

## 2019-01-19 ENCOUNTER — Ambulatory Visit: Payer: BC Managed Care – PPO | Admitting: Speech Pathology

## 2019-01-19 ENCOUNTER — Encounter: Payer: Self-pay | Admitting: Family Medicine

## 2019-01-19 ENCOUNTER — Telehealth: Payer: Self-pay | Admitting: *Deleted

## 2019-01-19 DIAGNOSIS — R000 Tachycardia, unspecified: Secondary | ICD-10-CM

## 2019-01-19 DIAGNOSIS — R05 Cough: Secondary | ICD-10-CM

## 2019-01-19 DIAGNOSIS — U071 COVID-19: Secondary | ICD-10-CM

## 2019-01-19 DIAGNOSIS — R058 Other specified cough: Secondary | ICD-10-CM

## 2019-01-19 NOTE — Telephone Encounter (Signed)
Appt scheduled with Dr Percival Spanish for this Friday 01/23/19 at 11:00am, patient is aware.

## 2019-01-19 NOTE — Telephone Encounter (Signed)
Noted! Thank you

## 2019-01-19 NOTE — Telephone Encounter (Signed)
Noted.  Cards referral placed today, will try to expedite eval.  Covid 08/2018, sequelae since

## 2019-01-19 NOTE — Telephone Encounter (Signed)
Patient called stating that she was seen Thursday and was started on a medication to help with her heart beat. Patient stated that yesterday her resting heartbeat was anywhere from 57-174. Patient stated that her heart rate would drastically change  20-30 beats in a minute. Patient stated that she was having dizzy spells when that occurred.  Patient stated that she has been taking Diltiazem twice a day 6-8 hours apart.  Patient stated that she is going to send Dr. Danise Mina the readings from her heart beat yesterday. Patient stated that over 10 years ago she had a problem with her heart and was having fainting spells. Patient stated that she does not want to go to the ER and refers to see her cardiologist again Dr. Percival Spanish. Patient is hoping that Dr. Danise Mina can get her in soon to see him. Patient stated that if she gets to the point that she feels that she is going to pass out she will go tot he ER. ER precautions were given to patient and she verbalized understanding.

## 2019-01-21 ENCOUNTER — Ambulatory Visit: Payer: BC Managed Care – PPO | Admitting: Speech Pathology

## 2019-01-21 LAB — TRAB (TSH RECEPTOR BINDING ANTIBODY): TRAB: <1 IU/L (ref <=2.00)

## 2019-01-21 LAB — THYROID PEROXIDASE ANTIBODIES (TPO) (REFL): Thyroperoxidase Ab SerPl-aCnc: <1 IU/mL (ref <9)

## 2019-01-21 LAB — T3: T3, Total: 140 ng/dL (ref 76–181)

## 2019-01-22 DIAGNOSIS — Z7189 Other specified counseling: Secondary | ICD-10-CM | POA: Insufficient documentation

## 2019-01-22 DIAGNOSIS — R002 Palpitations: Secondary | ICD-10-CM | POA: Insufficient documentation

## 2019-01-22 DIAGNOSIS — R072 Precordial pain: Secondary | ICD-10-CM | POA: Insufficient documentation

## 2019-01-22 NOTE — Progress Notes (Signed)
Cardiology Office Note   Date:  01/23/2019   ID:  Cassie [name], Cassie [name] 1970/12/22, MRN [id number]  PCP:  Ria Bush, MD  Cardiologist:   Minus Breeding, MD  Referring:  Ria Bush, MD  Chief Complaint  Patient presents with  . Tachycardia  . Chest Pain  . Shortness of Breath      History of Present Illness: Cassie [name] is a 49 y.o. female who presents for evaluation of palpitations and dizziness.  She had COVID 19 in August.  Since her Covid diagnosis she has done poorly.  She feels dizziness.  Sometimes this is upon standing.  She has had shortness of breath.  She has had racing heartbeats that fluctuate to low heart rates very quickly.  She feels lightheaded.  She feels her chest pounding.  She initially presented with cough and hoarseness and shortness of breath.  She had burning in her lungs have fever and fatigue.  She recovered somewhat enough to go back out working in October but then seems to have gotten worse.  She seen pulmonary and has had some residual cramping noted on imaging.  She has been treated with Symbicort, reflux therapies, Allegra, multiple rounds of oral prednisone.  She saw ENT.  She is not had any frank syncope.  She is not having substernal chest pressure, neck or arm discomfort.  Prior to all of this she had felt well.  The patient has a past history of syncope in 2007. She had a loop monitor placed with no arrhythmias noted. It has been removed. She had normal coronaries on cardiac catheterization in 2006. She was just in the hospital couple of days ago chest pain. She was seen in consultation.  Stress perfusion study was negative for any evidence of ischemia.  She had a normal echo.   Past Medical History:  Diagnosis Date  . Allergy   . Asthma   . Cardiac disease   . Diffuse cystic mastopathy   . GERD (gastroesophageal reflux disease)   . Guttate psoriasis   . H/O syncope    cards work-up WNL (Kalea Perine) thought 2/2 overmedication  vs arrhythmias  . Hx of endometriosis    hysterectomy  . Hx of migraines    occasional; improved since hysterectomy  . Labral tear of shoulder 02/2014   R, pending surgery Tamera Punt)  . Rosacea    mild    Past Surgical History:  Procedure Laterality Date  . BREAST BIOPSY  2010   left; core biopsy  . BREAST EXCISIONAL BIOPSY Bilateral    multiple bilateral  . BREAST MASS EXCISION  2008  . BREAST SURGERY Right    excision of lesion  . CARDIAC CATHETERIZATION  2007  . CHOLECYSTECTOMY  2008  . COLONOSCOPY  2006   WNL Nicolasa Ducking)  . COLONOSCOPY  06/2013   mod diverticulosis, rpt 10 yrs Henrene Pastor)  . LAPAROSCOPY  1994   endometriosis  . TONSILLECTOMY  1994  . TONSILLECTOMY AND ADENOIDECTOMY  1995  . TOTAL ABDOMINAL HYSTERECTOMY     Dr Rayford Halsted for endometriosis     Current Outpatient Medications  Medication Sig Dispense Refill  . albuterol (PROVENTIL) (2.5 MG/3ML) 0.083% nebulizer solution Take 3 mLs (2.5 mg total) by nebulization every 6 (six) hours as needed for wheezing or shortness of breath. 150 mL 0  . benzonatate (TESSALON) 200 MG capsule Take 1 capsule (200 mg total) by mouth 3 (three) times daily as needed for cough. 45 capsule 3  . Biotin 5  MG CAPS Take 1 capsule (5 mg total) by mouth daily.  0  . budesonide-formoterol (SYMBICORT) 160-4.5 MCG/ACT inhaler Inhale 2 puffs into the lungs 2 (two) times daily. 1 Inhaler 3  . CHLORPHENIRAMINE-ACETAMINOPHEN PO Take 4 mg by mouth at bedtime.    Marland Kitchen Dextromethorphan Polistirex (DELSYM PO) Take by mouth daily.    . famotidine (PEPCID) 20 MG tablet One after supper 30 tablet 11  . fexofenadine (ALLEGRA) 180 MG tablet Take 180 mg by mouth daily.    . pantoprazole (PROTONIX) 40 MG tablet Take 1 tablet (40 mg total) by mouth daily. Take 30-60 min before first meal of the day 30 tablet 2  . traMADol (ULTRAM) 50 MG tablet Take 50 mg by mouth daily as needed.     No current facility-administered medications for this visit.    Allergies:    Fish allergy, Shellfish allergy, Chocolate, Penicillins, and Tape    ROS:  Please see the history of present illness.   Otherwise, review of systems are positive for non.   All other systems are reviewed and negative.    PHYSICAL EXAM: VS:  BP 128/82 (BP Location: Right Arm, Patient Position: Sitting, Cuff Size: Large)   Pulse (!) 103   Temp (!) 97.5 F (36.4 C)   Ht 5\' 5"  (1.651 m)   Wt 194 lb (88 kg)   LMP 01/27/2000   BMI 32.28 kg/m  , BMI Body mass index is 32.28 kg/m. GENERAL:   She is having a dry hacking nonproductive cough and hoarseness in the office.  She is not in distress. NECK:  No jugular venous distention, waveform within normal limits, carotid upstroke brisk and symmetric, no bruits, no thyromegaly LUNGS:  Clear to auscultation bilaterally, mild upper airway wheezing CHEST:  Unremarkable HEART:  PMI not displaced or sustained,S1 and S2 within normal limits, no S3, no S4, no clicks, no rubs, no murmurs ABD:  Flat, positive bowel sounds normal in frequency in pitch, no bruits, no rebound, no guarding, no midline pulsatile mass, no hepatomegaly, no splenomegaly EXT:  2 plus pulses throughout, no edema, no cyanosis no clubbing   EKG:  EKG is  ordered today. Sinus tachycardia, rate 103, axis within normal limits, intervals within normal limits, no acute ST-T wave changes.  Recent Labs: 10/24/2018: Brain Natriuretic Peptide 32 01/09/2019: Hemoglobin 14.4; Platelets 230.0 01/15/2019: ALT 21; BUN 12; Creatinine, Ser 1.02; Potassium 4.5; Sodium 140; TSH 1.62    Lipid Panel   Wt Readings from Last 3 Encounters:  01/23/19 194 lb (88 kg)  01/15/19 200 lb (90.7 kg)  01/08/19 200 lb (90.7 kg)      Other studies Reviewed: Additional studies/ records that were reviewed today include:  Pulmonary records.  CXR and CT images personally reviewed. Review of the above records demonstrates:  See elsewhere  ASSESSMENT AND PLAN:  CHEST PAIN:     The patient has chest discomfort  and dyspnea.  These are consistent with the "long haul" symptoms that we are seeing.  I am going to order a high-sensitivity troponin and an echocardiogram.  I will have a low threshold for cardiac MRI given known involvement with this virus.   PALPITATIONS:    She did not have an orthostatic blood pressure drop in the office today.  I am going to have her wear a 3-day Holter.  I will have a low threshold for managing her possibly with a calcium channel blocker.   Current medicines are reviewed at length with the patient today.  The patient does not have concerns regarding medicines.  The following changes have been made:  None Labs/ tests ordered today include:    Orders Placed This Encounter  Procedures  . LONG TERM MONITOR (3-14 DAYS)  . EKG 12-Lead  . ECHOCARDIOGRAM COMPLETE     Disposition:   FU with me after the above testing.      Signed, Minus Breeding, MD  01/23/2019 12:59 PM    [facility]

## 2019-01-22 NOTE — Telephone Encounter (Addendum)
fyi - patient is scheduled to see Dr Percival Spanish tomorrow. She had Covid-19 bronchitis infection late 08/2018, with persistent cough, dyspnea, hoaresness since then. Retested 10/17/2018 negative. Now with new tachycardia/palpitations and dizziness. Would like further recommendations for eval/management of new palpitations.  She has seen ENT, pulmonology, is undergoing speech therapy.

## 2019-01-23 ENCOUNTER — Encounter: Payer: Self-pay | Admitting: Cardiology

## 2019-01-23 ENCOUNTER — Other Ambulatory Visit: Payer: BC Managed Care – PPO | Admitting: *Deleted

## 2019-01-23 ENCOUNTER — Ambulatory Visit: Payer: BC Managed Care – PPO | Admitting: Cardiology

## 2019-01-23 ENCOUNTER — Other Ambulatory Visit: Payer: Self-pay

## 2019-01-23 ENCOUNTER — Telehealth: Payer: Self-pay | Admitting: Radiology

## 2019-01-23 VITALS — BP 128/82 | HR 103 | Temp 97.5°F | Ht 65.0 in | Wt 194.0 lb

## 2019-01-23 DIAGNOSIS — R002 Palpitations: Secondary | ICD-10-CM

## 2019-01-23 DIAGNOSIS — R072 Precordial pain: Secondary | ICD-10-CM | POA: Diagnosis not present

## 2019-01-23 DIAGNOSIS — Z7189 Other specified counseling: Secondary | ICD-10-CM

## 2019-01-23 DIAGNOSIS — R0602 Shortness of breath: Secondary | ICD-10-CM

## 2019-01-23 LAB — TROPONIN I (HIGH SENSITIVITY): Troponin I (High Sensitivity): 3 ng/L (ref <18)

## 2019-01-23 NOTE — Telephone Encounter (Signed)
Enrolled patient for a 3 day Zio monitor to be mailed to patients home.  

## 2019-01-23 NOTE — Patient Instructions (Addendum)
Medication Instructions:  No Changes *If you need a refill on your cardiac medications before your next appointment, please call your pharmacy*  Lab Work: Your physician recommends that you return for lab work today at [place] [facility] (Troponin [employer])  If you have labs (blood work) drawn today and your tests are completely normal, you will receive your results only by: Marland Kitchen MyChart Message (if you have MyChart) OR . A paper copy in the mail If you have any lab test that is abnormal or we need to change your treatment, we will call you to review the results.  Testing/Procedures: Your physician has requested that you have an echocardiogram. Echocardiography is a painless test that uses sound waves to create images of your heart. It provides your doctor with information about the size and shape of your heart and how well your heart's chambers and valves are working. This procedure takes approximately one hour. There are no restrictions for this procedure. [place] has recommended that you wear an event monitor. Event monitors are medical devices that record the heart's electrical activity. Doctors most often [place] these monitors to diagnose arrhythmias. Arrhythmias are problems with the speed or rhythm of the heartbeat. The monitor is a small, portable device. You can wear one while you do your normal daily activities. This is usually used to diagnose what is causing palpitations/syncope (passing out).  Follow-Up: At [facility], you and your health needs are our priority.  As part of our continuing mission to provide you with exceptional heart care, we have created designated Provider Care Teams.  These Care Teams include your primary Cardiologist (physician) and Advanced Practice Providers (APPs -  Physician Assistants and Nurse Practitioners) who all work together to provide you with the care you need, when you need it.  Your next  appointment:   4 week(s)  The format for your next appointment:   In Person  Provider:   Minus Breeding, MD  Other Instructions [place] Monitor Instructions   Your physician has requested you wear your ZIO patch monitor__3___days.   This is a single patch monitor.  [employer] supplies one patch monitor per enrollment.  Additional stickers are not available.   Please do not apply patch if you will be having a Nuclear Stress Test, Echocardiogram, Cardiac CT, MRI, or Chest Xray during the time frame you would be wearing the monitor. The patch cannot be worn during these tests.  You cannot remove and re-apply the ZIO XT patch monitor.   Your ZIO patch monitor will be sent USPS Priority mail from [facility] directly to your home address. The monitor may also be mailed to a PO BOX if home delivery is not available.   It may take 3-5 days to receive your monitor after you have been enrolled.   Once you have received you monitor, please review enclosed instructions.  Your monitor has already been registered assigning a specific monitor serial # to you.   Applying the monitor   Shave hair from upper left chest.   Hold abrader disc by orange tab.  Rub abrader in 40 strokes over left upper chest as indicated in your monitor instructions.   Clean area with 4 enclosed alcohol pads .  Use all pads to assure are is cleaned thoroughly.  Let dry.   Apply patch as indicated in monitor instructions.  Patch will be place under collarbone on left side of chest  with arrow pointing upward.   Rub patch adhesive wings for 2 minutes.Remove white label marked "1".  Remove white label marked "2".  Rub patch adhesive wings for 2 additional minutes.   While looking in a mirror, press and release button in center of patch.  A small green light will flash 3-4 times .  This will be your only indicator the monitor has been turned on.     Do not shower for the first 24 hours.  You may shower after  the first 24 hours.   Press button if you feel a symptom. You will hear a small click.  Record Date, Time and Symptom in the Patient Log Book.   When you are ready to remove patch, follow instructions on last 2 pages of Patient Log Book.  Stick patch monitor onto last page of Patient Log Book.   Place Patient Log Book in [place] box.  Use locking tab on box and tape box closed securely.  The [employer] and [employer] has [employer] on it.  Please place in mailbox as soon as possible.  Your physician should have your test results approximately 7 days after the monitor has been mailed back to [facility].   Call [place] at [phone] if you have questions regarding your ZIO XT patch monitor.  Call them immediately if you see an orange light blinking on your monitor.   If your monitor falls off in less than 4 days contact our Monitor department at [phone].  If your monitor becomes loose or falls off after 4 days call [employer] at [phone] for suggestions on securing your monitor.

## 2019-01-26 ENCOUNTER — Other Ambulatory Visit: Payer: Self-pay

## 2019-01-26 ENCOUNTER — Ambulatory Visit: Payer: BC Managed Care – PPO | Admitting: Speech Pathology

## 2019-01-26 DIAGNOSIS — R490 Dysphonia: Secondary | ICD-10-CM

## 2019-01-26 DIAGNOSIS — Z20828 Contact with and (suspected) exposure to other viral communicable diseases: Secondary | ICD-10-CM | POA: Diagnosis not present

## 2019-01-27 ENCOUNTER — Encounter: Payer: Self-pay | Admitting: Speech Pathology

## 2019-01-27 NOTE — Therapy (Signed)
[place] MAIN [facility] SERVICES [address] [place], [place], [address] Phone: [phone]   Fax:  [phone]  Speech Language Pathology Treatment  Patient Details  Name: Cassie [name] MRN: [id number] Date of Birth: 12-05-1970 Referring Provider (SLP): Dr. Tami Ribas   Encounter Date: 01/26/2019  End of Session - 01/27/19 0954    Visit Number  5    Number of Visits  17    Date for SLP Re-Evaluation  02/24/19    Authorization Type  [employer]    Authorization Time Period  Start 12/30/2018    Authorization - Visit Number  5    Authorization - Number of Visits  10    SLP Start Time  1600    SLP Stop Time   [id number]    SLP Time Calculation (min)  45 min    Activity Tolerance  Patient tolerated treatment well       Past Medical History:  Diagnosis Date  . Allergy   . Asthma   . Cardiac disease   . Diffuse cystic mastopathy   . GERD (gastroesophageal reflux disease)   . Guttate psoriasis   . H/O syncope    cards work-up WNL (Hochrein) thought 2/2 overmedication vs arrhythmias  . Hx of endometriosis    hysterectomy  . Hx of migraines    occasional; improved since hysterectomy  . Labral tear of shoulder 02/2014   R, pending surgery Tamera Punt)  . Rosacea    mild    Past Surgical History:  Procedure Laterality Date  . BREAST BIOPSY  2010   left; core biopsy  . BREAST EXCISIONAL BIOPSY Bilateral    multiple bilateral  . BREAST MASS EXCISION  2008  . BREAST SURGERY Right    excision of lesion  . CARDIAC CATHETERIZATION  2007  . CHOLECYSTECTOMY  2008  . COLONOSCOPY  2006   WNL Nicolasa Ducking)  . COLONOSCOPY  06/2013   mod diverticulosis, rpt 10 yrs Henrene Pastor)  . LAPAROSCOPY  1994   endometriosis  . TONSILLECTOMY  1994  . TONSILLECTOMY AND ADENOIDECTOMY  1995  . TOTAL ABDOMINAL HYSTERECTOMY     Dr Rayford Halsted for endometriosis    There were no vitals filed for this visit.  Subjective Assessment - 01/27/19 0952    Subjective  The patient has  been referred to [place] TREATMENT - 01/27/19 0001      General Information   Behavior/Cognition  Alert;Cooperative;Pleasant mood    HPI  49 year old woman S/P Covid-20 August 2018 with ongoing cough since the infection.  Patient was seen by Dr. Tami Ribas with findings including mild vocal fold bowing, good mobility, and intermittent paradoxical vocal fold movement (PVFM).       Treatment Provided   Treatment provided  Cognitive-Linquistic      Pain Assessment   Pain Assessment  No/denies pain      Cognitive-Linquistic Treatment   Treatment focused on  Voice    Skilled Treatment  The patient was provided with written and verbal teaching regarding vocal hygiene.  The patient was provided with written and verbal teaching regarding neck, shoulder, tongue, and throat stretches exercises to promote relaxed phonation.   The patient was provided with written and verbal teaching regarding breath support exercises.  The patient was provided with verbal and written Flow phonation therapy: Skill Level One: establish airflow release: Unarticulated: greatest success with sustained fricative, demonstrating longer duration of unimpeded airflow.  Added changes in airflow velocity with sustained fricatives.  Articulated Skill Level: patient is having difficulty releasing thoracic and laryngeal tension for unvoiced articulated airflow.  Phonation: Patient able to sustain hum, /n/ sound, and straw phonation with less tension.  Added pitch changes to straw phonation and resonant voice.      Assessment / Recommendations / Plan   Plan  Continue with current plan of care      Progression Toward Goals   Progression toward goals  Progressing toward goals       SLP Education - 01/27/19 0953    Education Details  Continue the airflow exercises, try not to fight the vocal fold closure    Person(s) Educated  Patient    Methods  Explanation    Comprehension  Verbalized  understanding         SLP Long Term Goals - 12/31/18 1312      SLP LONG TERM GOAL #1   Title  The patient will demonstrate independent understanding of vocal hygiene concepts and extrinsic laryngeal muscle stretches.    Time  8    Period  Weeks    Status  New    Target Date  02/24/19      SLP LONG TERM GOAL #2   Title  The patient will be independent for abdominal breathing and breath support exercises.    Time  8    Period  Weeks    Status  New    Target Date  02/24/19      SLP LONG TERM GOAL #3   Title  The patient will demonstrate independent understanding of cough rescue and distraction techniques.    Time  8    Period  Weeks    Status  New    Target Date  02/24/19      SLP LONG TERM GOAL #4   Title  The patient will minimize vocal tension via flow phonation therapy (or comparable technique) with min SLP cues with 80% accuracy.    Time  8    Period  Weeks    Status  New    Target Date  02/24/19      SLP LONG TERM GOAL #5   Title  The patient will maintain relaxed phonation / oral resonance for paragraph length recitation with 80% accuracy.    Time  8    Period  Weeks    Status  New    Target Date  02/24/19       Plan - 01/27/19 0954    Clinical Impression Statement  Patient is inconsistently able to sustain unvoiced airflow without thoracic / laryngeal tensing and abrupt laryngeal valving.  She has been referred to [facility].    Speech Therapy Frequency  2x / week    Duration  Other (comment)    Treatment/Interventions  Other (comment);Patient/family education    Potential to Achieve Goals  Fair    Potential Considerations  Ability to learn/carryover information;Previous level of function;Co-morbidities;Severity of impairments;Cooperation/participation level;Medical prognosis;Family/community support    SLP Home Exercise Plan  Provided    Consulted and Agree with Plan of Care  Patient       Patient will benefit from skilled therapeutic intervention  in order to improve the following deficits and impairments:   Dysphonia    Problem List Patient Active Problem List   Diagnosis Date Noted  . Precordial chest pain 01/22/2019  . Palpitations 01/22/2019  . Educated about COVID-19 virus infection 01/22/2019  . Tachycardia 01/15/2019  .  Low TSH level 01/15/2019  . Hair loss 01/06/2019  . Upper airway cough syndrome 11/21/2018  . Urinary frequency 10/24/2018  . COVID-19 virus infection 09/09/2018  . Frontal headache 07/14/2018  . Vitamin D deficiency 12/12/2017  . Floaters, bilateral 11/08/2017  . Pre-syncope 11/08/2017  . Stressful life events affecting family and household 11/08/2017  . Chest pain at rest 02/09/2016  . Extrinsic asthma 01/20/2016  . Labral tear of shoulder 02/01/2014  . Bowel habit changes 04/28/2013  . Obesity, Class I, BMI 30-34.9 01/09/2013  . Diffuse cystic mastopathy 08/18/2012  . Family history of breast cancer 08/18/2012  . Healthcare maintenance 02/27/2011  . Family history of melanoma 02/27/2011  . SYNCOPE, HX OF 08/11/2009  . BREAST MASS, HX OF 08/11/2009   Leroy Sea, MS/CCC- SLP  Lou Miner 01/27/2019, 9:56 AM  [place] MAIN [facility] SERVICES [address] [place], [place], [address] Phone: [phone]   Fax:  [phone]   Name: Cassie [name] MRN: [id number] Date of Birth: 1970-04-30

## 2019-01-28 ENCOUNTER — Ambulatory Visit (INDEPENDENT_AMBULATORY_CARE_PROVIDER_SITE_OTHER): Payer: BC Managed Care – PPO

## 2019-01-28 ENCOUNTER — Ambulatory Visit: Payer: BC Managed Care – PPO | Admitting: Speech Pathology

## 2019-01-28 DIAGNOSIS — R002 Palpitations: Secondary | ICD-10-CM

## 2019-01-29 DIAGNOSIS — Z6833 Body mass index (BMI) 33.0-33.9, adult: Secondary | ICD-10-CM | POA: Diagnosis not present

## 2019-01-29 DIAGNOSIS — R05 Cough: Secondary | ICD-10-CM | POA: Diagnosis not present

## 2019-01-29 DIAGNOSIS — R490 Dysphonia: Secondary | ICD-10-CM | POA: Diagnosis not present

## 2019-01-30 ENCOUNTER — Ambulatory Visit: Payer: BC Managed Care – PPO | Attending: Internal Medicine

## 2019-01-30 ENCOUNTER — Telehealth: Payer: Self-pay

## 2019-01-30 ENCOUNTER — Ambulatory Visit: Payer: BC Managed Care – PPO | Admitting: Speech Pathology

## 2019-01-30 DIAGNOSIS — Z20822 Contact with and (suspected) exposure to covid-19: Secondary | ICD-10-CM

## 2019-01-30 NOTE — Telephone Encounter (Signed)
Spoke with [facility] scheduling COVID test at [facility] today at 12:30.    Spoke with pt relaying appt info.  Pt will go today.   However, pt is asking if she should get antibody blood test done.  Pls advise.

## 2019-01-30 NOTE — Telephone Encounter (Signed)
Patient called and l/m stating that she had COVID before and her husband just got tested for COVID and was positive. Patient has several appointments next week with pulmonology and ECHO and she is wondering if she can get COVID again if she had it already and has the antibodies. Also should she  cancel her appointments for next week?

## 2019-01-30 NOTE — Telephone Encounter (Signed)
Spoke with pt relaying Dr. G's message. Pt verbalizes understanding.  

## 2019-01-30 NOTE — Telephone Encounter (Signed)
If swab is positive she may have recurrent covid and would restart her quarantine/isolation.  If swab is negative and no new symptoms, will bring her in for Ab testing.

## 2019-01-30 NOTE — Telephone Encounter (Signed)
She should have some immunity after getting covid but we don't know how long this lasts, probably at least 3-6 months.  I recommend she go get tested again prior to next week's appointments - if positive may need to cancel them.  plz schedule her for covid test today. I have ordered.

## 2019-01-31 ENCOUNTER — Other Ambulatory Visit: Payer: Self-pay | Admitting: Family Medicine

## 2019-01-31 DIAGNOSIS — U071 COVID-19: Secondary | ICD-10-CM

## 2019-01-31 LAB — NOVEL CORONAVIRUS, NAA: SARS-CoV-2, NAA: NOT DETECTED

## 2019-02-02 ENCOUNTER — Ambulatory Visit: Payer: BC Managed Care – PPO | Admitting: Internal Medicine

## 2019-02-02 ENCOUNTER — Other Ambulatory Visit (INDEPENDENT_AMBULATORY_CARE_PROVIDER_SITE_OTHER): Payer: BC Managed Care – PPO

## 2019-02-02 DIAGNOSIS — U071 COVID-19: Secondary | ICD-10-CM

## 2019-02-03 ENCOUNTER — Other Ambulatory Visit (HOSPITAL_COMMUNITY): Payer: BC Managed Care – PPO

## 2019-02-03 LAB — SAR COV2 SEROLOGY (COVID19)AB(IGG),IA: SARS CoV2 AB IGG: POSITIVE — AB

## 2019-02-09 ENCOUNTER — Telehealth: Payer: Self-pay | Admitting: Cardiology

## 2019-02-09 DIAGNOSIS — R002 Palpitations: Secondary | ICD-10-CM | POA: Diagnosis not present

## 2019-02-09 DIAGNOSIS — R490 Dysphonia: Secondary | ICD-10-CM | POA: Diagnosis not present

## 2019-02-09 NOTE — Telephone Encounter (Signed)
Pt called to report that she had 2 more episodes of pre syncope this weekend.. she got very dizzy and fell to the ground but she never lost consciousness. She sounded very congested, hoarse, and coughing on the phone.   She says she mailed back her 3 day Zio last Monday and we are still waiting on the report.   Her Echo was rescheduled for 02/19/19 due to her husband having COVID.Marland Kitchen. he is able to return to work in 2 days.. she tested negative 10 days ago with no plan to retest.    Pt says that she wanted Dr. Percival Spanish to know that she had these episodes again. I advised her to continue to monitor and will forward to Dr. Percival Spanish for review.   Will wait to reschedule echo to make it sooner until Dr. Percival Spanish reviews.. pt says she has been hoarse like this since she had COVID in August but with her coughing aggressively and the congestion I heard on the phone.. will see if pt should consider testing again prior to having her come back to the office.

## 2019-02-09 NOTE — Telephone Encounter (Signed)
New Message  Pt c/o Syncope: STAT if syncope occurred within 30 minutes and pt complains of lightheadedness High Priority if episode of passing out, completely, today or in last 24 hours   1. Did you pass out today? No, Saturday and Sunday   2. When is the last time you passed out? Sunday (yesterday)  3. Has this occurred multiple times? Yes  4. Did you have any symptoms prior to passing out? Low BP, Racing HR, Headache, SOB, cough, no voice, had COVID-19 (August 2020),  Dizziness when standing up,

## 2019-02-09 NOTE — Telephone Encounter (Signed)
Need to get the echo and monitor data before I can comment.

## 2019-02-10 NOTE — Telephone Encounter (Signed)
Spoke with patient. Patient informed that until Dr. Percival Spanish has her monitor results and Echo results he is unable to comment on the situation. Patient requests Echo be moved up. Message sent to scheduling to see if patient can have sooner echo appointment.   Just FYI - Patient's covid screen was negative and she has antibodies. Husband is off of quarantine and returns to work tomorrow.

## 2019-02-11 ENCOUNTER — Ambulatory Visit (HOSPITAL_COMMUNITY)
Admission: RE | Admit: 2019-02-11 | Discharge: 2019-02-11 | Disposition: A | Discharge status: Home / Self Care | Payer: BC Managed Care – PPO | Source: Ambulatory Visit | Attending: Cardiology | Admitting: Cardiology

## 2019-02-11 ENCOUNTER — Other Ambulatory Visit: Payer: Self-pay

## 2019-02-11 DIAGNOSIS — K219 Gastro-esophageal reflux disease without esophagitis: Secondary | ICD-10-CM | POA: Diagnosis not present

## 2019-02-11 DIAGNOSIS — R0602 Shortness of breath: Secondary | ICD-10-CM | POA: Insufficient documentation

## 2019-02-11 NOTE — Progress Notes (Signed)
  Echocardiogram 2D Echocardiogram has been performed.  Cassie [name] 02/11/2019, 8:56 AM

## 2019-02-16 ENCOUNTER — Telehealth: Payer: Self-pay | Admitting: Cardiology

## 2019-02-16 NOTE — Telephone Encounter (Signed)
Spoke with patient. Patient reports having palpitations, dizziness and felling like she will faint for 2 weeks. Patient's Echo and Heart monitor were negative. Patient reports her heart rate has been as high as 196bpm. Today HR has elevated to 138bpm.   Patient advised to call 911 or go to the nearest ED due to being symptomatic and tachycardic. Patient refused. She does not want to contact her PCP to be seen today because she states they will tell her to call us.   Soonest cardiology appt is Wednesday 2/17. Patient placed on schedule for 2:15pm.   Patient advised to call 911 or seek medical treatment if symptoms persist or become worse.

## 2019-02-16 NOTE — Telephone Encounter (Signed)
New Message  STAT if patient feels like he/she is going to faint   1) Are you dizzy now? No  2) Do you feel faint or have you passed out? Yes  3) Do you have any other symptoms? No  4) Have you checked your HR and BP (record if available)? HR is fluctuating. It will go up to 114 then drop to 63.

## 2019-02-18 ENCOUNTER — Encounter: Payer: Self-pay | Admitting: Physician Assistant

## 2019-02-18 ENCOUNTER — Encounter: Payer: Self-pay | Admitting: *Deleted

## 2019-02-18 ENCOUNTER — Encounter: Payer: Self-pay | Admitting: Internal Medicine

## 2019-02-18 ENCOUNTER — Ambulatory Visit: Payer: BC Managed Care – PPO | Admitting: Internal Medicine

## 2019-02-18 ENCOUNTER — Other Ambulatory Visit: Payer: Self-pay

## 2019-02-18 ENCOUNTER — Telehealth: Payer: Self-pay | Admitting: *Deleted

## 2019-02-18 ENCOUNTER — Ambulatory Visit: Payer: BC Managed Care – PPO | Admitting: Physician Assistant

## 2019-02-18 VITALS — BP 128/72 | HR 98 | Temp 98.4°F | Ht 65.5 in | Wt 207.0 lb

## 2019-02-18 DIAGNOSIS — U071 COVID-19: Secondary | ICD-10-CM | POA: Diagnosis not present

## 2019-02-18 DIAGNOSIS — J849 Interstitial pulmonary disease, unspecified: Secondary | ICD-10-CM

## 2019-02-18 DIAGNOSIS — R55 Syncope and collapse: Secondary | ICD-10-CM

## 2019-02-18 DIAGNOSIS — R058 Other specified cough: Secondary | ICD-10-CM

## 2019-02-18 DIAGNOSIS — R05 Cough: Secondary | ICD-10-CM | POA: Diagnosis not present

## 2019-02-18 MED ORDER — PREDNISONE 10 MG PO TABS: ORAL_TABLET | ORAL | 0 refills | Status: DC

## 2019-02-18 MED ORDER — ACETAMINOPHEN-CODEINE #4 300-60 MG PO TABS: 1.0000 | ORAL_TABLET | ORAL | 0 refills | Status: DC | PRN

## 2019-02-18 MED ORDER — GABAPENTIN 100 MG PO CAPS: 100.0000 mg | ORAL_CAPSULE | Freq: Four times a day (QID) | ORAL | 2 refills | Status: DC

## 2019-02-18 NOTE — Progress Notes (Signed)
Cardiology Office Note:    Date:  02/20/2019   ID:  Cassie, [name] 1970/10/19, MRN [id number]  PCP:  Ria Bush, MD  Cardiologist:  Minus Breeding, MD  Electrophysiologist:  None   Referring MD: Ria Bush, MD   Chief Complaint  Patient presents with  . Follow-up    seen for Dr. Percival Spanish.    History of Present Illness:    Cassie [name] is a 49 y.o. female with a hx of asthma, due to psoriasis, syncope in 2007, and endometriosis s/p hysterectomy.  Previous work-up as part of the syncope evaluation in 2000 and involved loop recorder, however no arrhythmia was detected, therefore was removed.  She had normal coronary arteries on cardiac catheterization in 2006.  Myoview obtained on 02/10/2016 was low risk with EF of 78%, no reversible ischemia or infarction.  Echocardiogram obtained on the same day showed EF 60 to 65%, no significant valve disorder.  Patient was referred to cardiology service for evaluation of palpitation and dizziness.  She was diagnosed with COVID-19 in August 2020.  Since her diagnosis, she has had persistent dizziness upon standing.  She also had racing heartbeat that fluctuate to low heart rate.   She was most recently seen by Dr. Percival Spanish on 01/23/2019 who felt her chest discomfort and the dyspnea is related to COVID-19.  High-sensitivity troponin was negative.  As for her dizziness, she did not have significant orthostatic blood pressure drop in the office.  Repeat echocardiogram obtained on 02/11/2019 showed EF 60 to 65%, normal pulmonary artery pressure, no significant valve disorder.  3 days ZIO monitor did not show significant arrhythmia.  Patient presents today for cardiology office visit.  She continued to have dizzy spell when she change body positions.  Unfortunately for the 3 days she wore the ZIO monitor, she was completely asymptomatic.  Since then, symptom has recurred.  I discussed the case with DOD Dr. Ellyn Hack who recommended longer course of  Zio monitor.  We will proceed with 2-week heart monitor.  She says sometimes she can go for 3 to 4 days without any symptoms, other times she would have multiple episodes throughout the day.  I still suspect her blood pressure is dropping occasionally causing her to have pre-syncope.  I would recommend getting a blood pressure cuff.  Increase salt and fluid intake.  I wish to hold off on adding a calcium channel blocker in fear of this may worsen her presyncope due to dropping the blood pressure.  She does have a pulse ox and will frequently check her O2 saturation.  She was just seen by Dr. Melvyn Novas this morning and received a round of steroid therapy.  Past Medical History:  Diagnosis Date  . Allergy   . Asthma   . Cardiac disease   . Diffuse cystic mastopathy   . GERD (gastroesophageal reflux disease)   . Guttate psoriasis   . H/O syncope    cards work-up WNL (Hochrein) thought 2/2 overmedication vs arrhythmias  . Hx of endometriosis    hysterectomy  . Hx of migraines    occasional; improved since hysterectomy  . Labral tear of shoulder 02/2014   R, pending surgery Tamera Punt)  . Rosacea    mild    Past Surgical History:  Procedure Laterality Date  . BREAST BIOPSY  2010   left; core biopsy  . BREAST EXCISIONAL BIOPSY Bilateral    multiple bilateral  . BREAST MASS EXCISION  2008  . BREAST SURGERY Right  excision of lesion  . CARDIAC CATHETERIZATION  2007  . CHOLECYSTECTOMY  2008  . COLONOSCOPY  2006   WNL Nicolasa Ducking)  . COLONOSCOPY  06/2013   mod diverticulosis, rpt 10 yrs Henrene Pastor)  . LAPAROSCOPY  1994   endometriosis  . TONSILLECTOMY  1994  . TONSILLECTOMY AND ADENOIDECTOMY  1995  . TOTAL ABDOMINAL HYSTERECTOMY     Dr Rayford Halsted for endometriosis    Current Medications: Current Meds  Medication Sig  . acetaminophen-codeine (TYLENOL #4) 300-60 MG tablet Take 1 tablet by mouth every 4 (four) hours as needed for moderate pain.  Marland Kitchen albuterol (PROVENTIL) (2.5 MG/3ML) 0.083% nebulizer  solution Take 3 mLs (2.5 mg total) by nebulization every 6 (six) hours as needed for wheezing or shortness of breath.  . Biotin 5 MG CAPS Take 1 capsule (5 mg total) by mouth daily.  Marland Kitchen Dextromethorphan Polistirex (DELSYM PO) Take by mouth daily.  . famotidine (PEPCID) 20 MG tablet One after supper  . gabapentin (NEURONTIN) 100 MG capsule Take 1 capsule (100 mg total) by mouth 4 (four) times daily. One three times daily  . pantoprazole (PROTONIX) 40 MG tablet Take 1 tablet (40 mg total) by mouth daily. Take 30-60 min before first meal of the day  . predniSONE (DELTASONE) 10 MG tablet Take 4 for two days three for two days two for two days one for two days     Allergies:   Fish allergy, Shellfish allergy, Chocolate, Penicillins, and Tape   Social History   Socioeconomic History  . Marital status: Married    Spouse name: Not on file  . Number of children: 2  . Years of education: Not on file  . Highest education level: Not on file  Occupational History  . Occupation: Glass blower/designer  Tobacco Use  . Smoking status: Former Smoker    Quit date: 02/21/2007    Years since quitting: 12.0  . Smokeless tobacco: Never Used  Substance and Sexual Activity  . Alcohol use: Yes    Alcohol/week: 0.0 standard drinks    Comment: occas  . Drug use: No  . Sexual activity: Yes    Birth control/protection: Surgical  Other Topics Concern  . Not on file  Social History Narrative   Glass blower/designer   Lives with husband; 2 children; 1 dog   Son in prison   Social Determinants of Health   Financial Resource Strain:   . Difficulty of Paying Living Expenses: Not on file  Food Insecurity:   . Worried About Charity fundraiser in the Last Year: Not on file  . Ran Out of Food in the Last Year: Not on file  Transportation Needs:   . Lack of Transportation (Medical): Not on file  . Lack of Transportation (Non-Medical): Not on file  Physical Activity:   . Days of Exercise per Week: Not on file  . Minutes  of Exercise per Session: Not on file  Stress:   . Feeling of Stress : Not on file  Social Connections:   . Frequency of Communication with Friends and Family: Not on file  . Frequency of Social Gatherings with Friends and Family: Not on file  . Attends Religious Services: Not on file  . Active Member of Clubs or Organizations: Not on file  . Attends Archivist Meetings: Not on file  . Marital Status: Not on file     Family History: The patient's family history includes Breast cancer in her cousin and another family member; Breast  cancer (age of onset: 81) in her maternal grandmother and mother; Coronary artery disease in her father and paternal uncle; Diabetes in her maternal grandfather and paternal uncle; Epilepsy in her son; Heart disease in her maternal grandfather, paternal grandfather, and paternal grandmother; Hypertension in her father and mother; Irritable bowel syndrome in her daughter and mother; Other in her son; Pulmonary embolism in an other family member; Skin cancer in her mother; Skin cancer (age of onset: 76) in her sister; Stroke in her maternal grandmother; Thyroid disease in her sister. There is no history of Colon cancer.  ROS:   Please see the history of present illness.     All other systems reviewed and are negative.  EKGs/Labs/Other Studies Reviewed:    The following studies were reviewed today:  Echo 02/11/2019 IMPRESSIONS    1. Left ventricular ejection fraction, by estimation, is 60 to 65%. The  left ventricle has normal function. The left ventrical has no regional  wall motion abnormalities. Left ventricular diastolic parameters were  normal.  2. Right ventricular systolic function is normal. The right ventricular  size is normal. There is normal pulmonary artery systolic pressure.  3. The mitral valve is normal in structure and function. no evidence of  mitral valve regurgitation.  4. The aortic valve is tricuspid. Aortic valve  regurgitation is trivial .  No aortic stenosis is present.  5. The inferior vena cava is normal in size with greater than 50%  respiratory variability, suggesting right atrial pressure of 3 mmHg.   Comparison(s): No significant change from prior study.   EKG:  EKG is ordered today.  The ekg ordered today demonstrates normal sinus rhythm, heart rate 98, otherwise no significant ST-T wave changes.  Recent Labs: 10/24/2018: Brain Natriuretic Peptide 32 01/09/2019: Hemoglobin 14.4; Platelets 230.0 01/15/2019: ALT 21; BUN 12; Creatinine, Ser 1.02; Potassium 4.5; Sodium 140; TSH 1.62  Recent Lipid Panel    Component Value Date/Time   CHOL 174 12/13/2017 0829   CHOL 158 02/16/2016 0826   TRIG 108.0 12/13/2017 0829   HDL 59.10 12/13/2017 0829   HDL 50 02/16/2016 0826   CHOLHDL 3 12/13/2017 0829   VLDL 21.6 12/13/2017 0829   LDLCALC 94 12/13/2017 0829   LDLCALC 82 02/16/2016 0826    Physical Exam:    VS:  BP 128/72   Pulse 98   Temp 98.4 F (36.9 C)   Ht 5' 5.5" (1.664 m)   Wt 207 lb (93.9 kg)   LMP 01/27/2000   SpO2 98%   BMI 33.92 kg/m     Wt Readings from Last 3 Encounters:  02/18/19 207 lb (93.9 kg)  02/18/19 204 lb 12.8 oz (92.9 kg)  01/23/19 194 lb (88 kg)     GEN:  Well nourished, well developed in no acute distress. Voice course HEENT: Normal NECK: No JVD; No carotid bruits LYMPHATICS: No lymphadenopathy CARDIAC: RRR, no murmurs, rubs, gallops RESPIRATORY:  Clear to auscultation without rales, wheezing or rhonchi  ABDOMEN: Soft, non-tender, non-distended MUSCULOSKELETAL:  No edema; No deformity  SKIN: Warm and dry NEUROLOGIC:  Alert and oriented x 3 PSYCHIATRIC:  Normal affect   ASSESSMENT:    1. Pre-syncope   2. COVID-19 virus infection    PLAN:    In order of problems listed above:  1. Presyncope: I suspect some of her symptom may be related to orthostatic changes despite previous orthostatic vital signs were negative.  I recommended increase salt  intake and fluid intake.  I also recommended compression  stocking.  She says that she did not have any symptoms during the 3 days she wore the [facility] monitor, I discussed the case with Dr. Ellyn Hack who recommended a longer course of heart monitor.  We recommended 2 weeks monitor.  I suspect her symptom is more related to orthostatic changes occurred after COVID-19 infection  2. COVID-19 infection: She had COVID-19 infection 6 months ago, unfortunately it has resulted in long-term effect on her body.  She has finished her multiple course of steroid therapy and continue to have cough and shortness of breath with minimal activity.  COVID-19 infection has also affected her vocal ability and her voice has not return to normal after 6 months.   Medication Adjustments/Labs and Tests Ordered: Current medicines are reviewed at length with the patient today.  Concerns regarding medicines are outlined above.  Orders Placed This Encounter  Procedures  . LONG TERM MONITOR (3-14 DAYS)  . EKG 12-Lead   No orders of the defined types were placed in this encounter.   Patient Instructions  Medication Instructions:  Your physician recommends that you continue on your current medications as directed. Please refer to the Current Medication list given to you today.  *If you need a refill on your cardiac medications before your next appointment, please call your pharmacy*  Lab Work: NONE ordered at this time of appointment   If you have labs (blood work) drawn today and your tests are completely normal, you will receive your results only by: Marland Kitchen MyChart Message (if you have MyChart) OR . A paper copy in the mail If you have any lab test that is abnormal or we need to change your treatment, we will call you to review the results.  Testing/Procedures: [place] [place]- Long Term Monitor Instructions   Your physician has requested you wear your ZIO patch monitor 14 days.   This is a single patch monitor.  [employer] supplies  one patch monitor per enrollment.  Additional stickers are not available.   Please do not apply patch if you will be having a Nuclear Stress Test, Echocardiogram, Cardiac CT, MRI, or Chest Xray during the time frame you would be wearing the monitor. The patch cannot be worn during these tests.  You cannot remove and re-apply the ZIO XT patch monitor.   Your ZIO patch monitor will be sent USPS Priority mail from [facility] directly to your home address. The monitor may also be mailed to a PO BOX if home delivery is not available.   It may take 3-5 days to receive your monitor after you have been enrolled.   Once you have received you monitor, please review enclosed instructions.  Your monitor has already been registered assigning a specific monitor serial # to you.   Applying the monitor   Shave hair from upper left chest.   Hold abrader disc by orange tab.  Rub abrader in 40 strokes over left upper chest as indicated in your monitor instructions.   Clean area with 4 enclosed alcohol pads .  Use all pads to assure are is cleaned thoroughly.  Let dry.   Apply patch as indicated in monitor instructions.  Patch will be place under collarbone on left side of chest with arrow pointing upward.   Rub patch adhesive wings for 2 minutes.Remove white label marked "1".  Remove white label marked "2".  Rub patch adhesive wings for 2 additional minutes.   While looking in a mirror, press and release button in center of patch.  A small green light will flash 3-4 times .  This will be your only indicator the monitor has been turned on.     Do not shower for the first 24 hours.  You may shower after the first 24 hours.   Press button if you feel a symptom. You will hear a small click.  Record Date, Time and Symptom in the Patient Log Book.   When you are ready to remove patch, follow instructions on last 2 pages of Patient Log Book.  Stick patch monitor onto last page of Patient Log Book.     Place Patient Log Book in [place] box.  Use locking tab on box and tape box closed securely.  The [employer] has [employer] on it.  Please place in mailbox as soon as possible.  Your physician should have your test results approximately 7 days after the monitor has been mailed back to [facility].   Call [place] at [phone] if you have questions regarding your ZIO XT patch monitor.  Call them immediately if you see an orange light blinking on your monitor.   If your monitor falls off in less than 4 days contact our Monitor department at [phone].  If your monitor becomes loose or falls off after 4 days call [employer] at [phone] for suggestions on securing your monitor.    Follow-Up: At [facility], you and your health needs are our priority.  As part of our continuing mission to provide you with exceptional heart care, we have created designated Provider Care Teams.  These Care Teams include your primary Cardiologist (physician) and Advanced Practice Providers (APPs -  Physician Assistants and Nurse Practitioners) who all work together to provide you with the care you need, when you need it.  Your next appointment:    push back scheduled appointment by 1 week   The format for your next appointment:   In Person  Provider:   Minus Breeding, MD or Almyra Deforest, PA-C  Other Instructions  Monitor blood pressure daily  Drink more fluids   Eat more salt  Wear compression stocking     How to Use Compression Stockings Compression stockings are elastic socks that squeeze the legs. They help increase blood flow (circulation) to the legs, decrease swelling in the legs, and reduce the chance of developing blood clots in the lower legs. Compression stockings are often used by people who:  Are recovering from surgery.  Have poor circulation in their legs.  Tend to get blood clots in their legs.  Have bulging (varicose) veins.  Sit or stay  in bed for long periods of time. Follow instructions from your health care provider about how and when to wear your compression stockings. How to wear compression stockings Before you put on your compression stockings:  Make sure that they are the correct size and degree of compression. If you do not know your size or required grade of compression, ask your health care provider and follow the manufacturer's instructions that come with the stockings.  Make sure that they are clean, dry, and in good condition.  Check them for rips and tears. Do not put them on if they are ripped or torn. Put your stockings on first thing in the morning, before you get out of bed. Keep them on for as long as your health care provider advises. When you are wearing your stockings:  Keep them as smooth as possible. Do not allow them to bunch up.  It is especially important to prevent the stockings from bunching up around your toes or behind your knees.  Do not roll the stockings downward and leave them rolled down. This can decrease blood flow to your leg.  Change them right away if they become wet or dirty. When you take off your stockings, inspect your legs and feet. Check for:  Open sores.  Red spots.  Swelling. General tips  Do not stop wearing compression stockings without talking to your health care provider first.  Wash your stockings every day with mild detergent in cold or warm water. Do not use bleach. Air-dry your stockings or dry them in a clothes dryer on low heat. It may be helpful to have two pairs so that you have a pair to wear while the other is being washed.  Replace your stockings every 3-6 months.  If skin moisturizing is part of your treatment plan, apply lotion or cream at night so that your skin will be dry when you put on the stockings in the morning. It is harder to put the stockings on when you have lotion on your legs or feet.  Wear nonskid shoes or slip-resistant socks when  walking while wearing compression stockings. Contact a health care provider and remove your stockings if you have:  A feeling of pins and needles in your feet or legs.  Open sores, red spots, or other skin changes on your feet or legs.  Swelling or pain that gets worse. Get help right away if you have:  Numbness or tingling in your lower legs that does not get better right after you take the stockings off.  Toes or feet that are unusually cold or turn a bluish color.  A warm or red area on your leg.  New swelling or soreness in your leg.  Shortness of breath.  Chest pain.  A fast or irregular heartbeat.  Light-headedness.  Dizziness. Summary  Compression stockings are elastic socks that squeeze the legs.  They help increase blood flow (circulation) to the legs, decrease swelling in the legs, and reduce the chance of developing blood clots in the lower legs.  Follow instructions from your health care provider about how and when to wear your compression stockings.  Do not stop wearing your compression stockings without talking to your health care provider first. This information is not intended to replace advice given to you by your health care provider. Make sure you discuss any questions you have with your health care provider. Document Revised: 12/20/2016 Document Reviewed: 12/20/2016 Elsevier Patient Education  2020 [place], [place], [place]  02/20/2019 10:36 PM    [facility]

## 2019-02-18 NOTE — Patient Instructions (Signed)
Medication Instructions:  Your physician recommends that you continue on your current medications as directed. Please refer to the Current Medication list given to you today.  *If you need a refill on your cardiac medications before your next appointment, please call your pharmacy*  Lab Work: NONE ordered at this time of appointment   If you have labs (blood work) drawn today and your tests are completely normal, you will receive your results only by: Marland Kitchen MyChart Message (if you have MyChart) OR . A paper copy in the mail If you have any lab test that is abnormal or we need to change your treatment, we will call you to review the results.  Testing/Procedures: [place] [place]- Long Term Monitor Instructions   Your physician has requested you wear your ZIO patch monitor 14 days.   This is a single patch monitor.  [employer] supplies one patch monitor per enrollment.  Additional stickers are not available.   Please do not apply patch if you will be having a Nuclear Stress Test, Echocardiogram, Cardiac CT, MRI, or Chest Xray during the time frame you would be wearing the monitor. The patch cannot be worn during these tests.  You cannot remove and re-apply the ZIO XT patch monitor.   Your ZIO patch monitor will be sent USPS Priority mail from [facility] directly to your home address. The monitor may also be mailed to a PO BOX if home delivery is not available.   It may take 3-5 days to receive your monitor after you have been enrolled.   Once you have received you monitor, please review enclosed instructions.  Your monitor has already been registered assigning a specific monitor serial # to you.   Applying the monitor   Shave hair from upper left chest.   Hold abrader disc by orange tab.  Rub abrader in 40 strokes over left upper chest as indicated in your monitor instructions.   Clean area with 4 enclosed alcohol pads .  Use all pads to assure are is cleaned thoroughly.  Let dry.   Apply  patch as indicated in monitor instructions.  Patch will be place under collarbone on left side of chest with arrow pointing upward.   Rub patch adhesive wings for 2 minutes.Remove white label marked "1".  Remove white label marked "2".  Rub patch adhesive wings for 2 additional minutes.   While looking in a mirror, press and release button in center of patch.  A small green light will flash 3-4 times .  This will be your only indicator the monitor has been turned on.     Do not shower for the first 24 hours.  You may shower after the first 24 hours.   Press button if you feel a symptom. You will hear a small click.  Record Date, Time and Symptom in the Patient Log Book.   When you are ready to remove patch, follow instructions on last 2 pages of Patient Log Book.  Stick patch monitor onto last page of Patient Log Book.   Place Patient Log Book in [place] box.  Use locking tab on box and tape box closed securely.  The [employer] has [employer] on it.  Please place in mailbox as soon as possible.  Your physician should have your test results approximately 7 days after the monitor has been mailed back to [facility].   Call [place] at [phone] if you have questions regarding your ZIO XT patch monitor.  Call them immediately if you see an orange light blinking on your monitor.   If your monitor falls off in less than 4 days contact our Monitor department at [phone].  If your monitor becomes loose or falls off after 4 days call [employer] at [phone] for suggestions on securing your monitor.    Follow-Up: At [facility], you and your health needs are our priority.  As part of our continuing mission to provide you with exceptional heart care, we have created designated Provider Care Teams.  These Care Teams include your primary Cardiologist (physician) and Advanced Practice Providers (APPs -  Physician Assistants and Nurse Practitioners) who all work  together to provide you with the care you need, when you need it.  Your next appointment:    push back scheduled appointment by 1 week   The format for your next appointment:   In Person  Provider:   Minus Breeding, MD or Almyra Deforest, PA-C  Other Instructions  Monitor blood pressure daily  Drink more fluids   Eat more salt  Wear compression stocking     How to Use Compression Stockings Compression stockings are elastic socks that squeeze the legs. They help increase blood flow (circulation) to the legs, decrease swelling in the legs, and reduce the chance of developing blood clots in the lower legs. Compression stockings are often used by people who:  Are recovering from surgery.  Have poor circulation in their legs.  Tend to get blood clots in their legs.  Have bulging (varicose) veins.  Sit or stay in bed for long periods of time. Follow instructions from your health care provider about how and when to wear your compression stockings. How to wear compression stockings Before you put on your compression stockings:  Make sure that they are the correct size and degree of compression. If you do not know your size or required grade of compression, ask your health care provider and follow the manufacturer's instructions that come with the stockings.  Make sure that they are clean, dry, and in good condition.  Check them for rips and tears. Do not put them on if they are ripped or torn. Put your stockings on first thing in the morning, before you get out of bed. Keep them on for as long as your health care provider advises. When you are wearing your stockings:  Keep them as smooth as possible. Do not allow them to bunch up. It is especially important to prevent the stockings from bunching up around your toes or behind your knees.  Do not roll the stockings downward and leave them rolled down. This can decrease blood flow to your leg.  Change them right away if they become  wet or dirty. When you take off your stockings, inspect your legs and feet. Check for:  Open sores.  Red spots.  Swelling. General tips  Do not stop wearing compression stockings without talking to your health care provider first.  Wash your stockings every day with mild detergent in cold or warm water. Do not use bleach. Air-dry your stockings or dry them in a clothes dryer on low heat. It may be helpful to have two pairs so that you have a pair to wear while the other is being washed.  Replace your stockings every 3-6 months.  If skin moisturizing is part of your treatment plan, apply lotion or cream at night so that your skin will be dry when you put on the stockings in the morning. It  is harder to put the stockings on when you have lotion on your legs or feet.  Wear nonskid shoes or slip-resistant socks when walking while wearing compression stockings. Contact a health care provider and remove your stockings if you have:  A feeling of pins and needles in your feet or legs.  Open sores, red spots, or other skin changes on your feet or legs.  Swelling or pain that gets worse. Get help right away if you have:  Numbness or tingling in your lower legs that does not get better right after you take the stockings off.  Toes or feet that are unusually cold or turn a bluish color.  A warm or red area on your leg.  New swelling or soreness in your leg.  Shortness of breath.  Chest pain.  A fast or irregular heartbeat.  Light-headedness.  Dizziness. Summary  Compression stockings are elastic socks that squeeze the legs.  They help increase blood flow (circulation) to the legs, decrease swelling in the legs, and reduce the chance of developing blood clots in the lower legs.  Follow instructions from your health care provider about how and when to wear your compression stockings.  Do not stop wearing your compression stockings without talking to your health care provider  first. This information is not intended to replace advice given to you by your health care provider. Make sure you discuss any questions you have with your health care provider. Document Revised: 12/20/2016 Document Reviewed: 12/20/2016 Elsevier Patient Education  2020 [employer].

## 2019-02-18 NOTE — Progress Notes (Signed)
Cassie [name], female    DOB: 05-31-1970,    MRN: [id number]   Brief patient profile:  17   yowf grew up in [place]/ [place] came [place] to [place] and stayed, stopped smoking completely 2009 but really ever much of smoker and some springtime rhinitis worse since in  [place] and [place] winter time "bronchial asthma" on short term saba then dx covid 19 Aug 30 2018 req ER eval  09/15/2018 >  Prednisone / albuterol  > transiently better but still no voice and cough >repeat x 3 total pred each time about 50% then relapase w/in a week and last dose 11/12/2018 and worse since so referred to pulmonary clinic 11/21/2018 by Dr   Danise Mina.     History of Present Illness  11/21/2018  Pulmonary/ 1st office eval/Araceli Coufal  Chief Complaint  Patient presents with  . Consult    Patient was diagnosed with Covid in Aug and hasn't had a voice since Aug and has had shortness of breath. Patient feels like she is getting worse since its getting colder out side. Patient states she still has a cough but is not productive. Patient states she still feels tired all the time but worse with exertion. Patient also states she has bruising that has poped up on her skin without injury.  Dyspnea:  100 ft or so Cough: daytime with walking or activity,  W/in hour of hs  Despite 2 pillows, no sputum Taking advair x 2weeks/ no better  saba 2 bid and neb  Tessalon helps some but only using at hs rec First take delsym two tsp every 12 hours and supplement if needed with  tramadol 50 mg up to 2 every 4 hours to suppress the urge to cough at all or even clear your throat.   Once you have eliminated the cough for 3 straight days try reducing the tramadol first,  then the delsym as tolerated.   Prednisone 10 mg take  4 each am x 2 days,   2 each am x 2 days,  1 each am x 2 days and stop (this is to eliminate allergies and inflammation from coughing) Protonix (pantoprazole) Take 30-60 min before first meal of the day and Pepcid 20 mg one hour  before bedtime until return  Stop advair and albuterol inhaler Only use your albuterol nebulizer as a rescue medication to be used if you can't catch your breath by resting   - The less you use it, the better it will work when you need it. - Ok to use up to one time  every 4 hours if you must but call for immediate appointment if use goes up over your usual need  GERD  Diet    12/01/2018 NP no better Delsym 2 tsp Twice daily  For cough  Tessalon Three times a day  For cough  Tramadol 50mg  1-2 every 4 hr as needed for severe cough , may make you sleep  Begin Chlorpheniramine 4mg  2 At bedtime  .  May use extra Chlorpheniramine 4mg  every 4hr as needed for drainage , throat clearing, use with caution as may make you sleepy.  Begin Allegra 180mg  daily in am .  Stop Tussionex.  Voice rest , sips of water to soothe throat  Avoid throat clearing. No singing .  Continue on Protonix and Pepcid.  Albuterol Neb every 4hr as needed for wheezing /shortness of breath .   12/08/2018 NP : no better Chest xray today .  Symbicort 2  puffs Twice daily  With spacer , rinse after use.  Delsym 2 tsp Twice daily  For cough  Tessalon Three times a day  For cough  Tramadol 50mg  1-2 every 4 hr as needed for severe cough , may make you sleep  Chlorpheniramine 4mg  2 At bedtime  .  May use extra Chlorpheniramine 4mg  every 4hr as needed for drainage , throat clearing, use with caution as may make you sleepy.  Allegra 180mg  daily in am .  Voice rest , sips of water to soothe throat  Avoid throat clearing. No singing .  Continue on Protonix and Pepcid.  Albuterol Neb every 4hr as needed for wheezing /shortness of breath .  Follow up with ENT as planned     12/22/2018 Prednisone taper over next week.  Go for Speech evaluation  Symbicort 2 puffs Twice daily  With spacer , rinse after use.  Delsym 2 tsp Twice daily  For cough  Tessalon Three times a day  For cough  Tramadol 50mg  1-2 every 4 hr as needed for  severe cough , may make you sleep  Chlorpheniramine 4mg  2 At bedtime  .  May use extra Chlorpheniramine 4mg  every 4hr as needed for drainage , throat clearing, use with caution as may make you sleepy.  Allegra 180mg  daily in am .  Voice rest , sips of water to soothe throat  Avoid throat clearing. No singing .  Continue on Protonix and Pepcid.  Albuterol Neb every 4hr as needed for wheezing /shortness of breath.  Follow up in  6  weeks with Dr. Melvyn Novas  Or Parrett NP and As needed   Please contact office for sooner follow up if symptoms do not improve or worsen or seek emergency care    02/18/2019  f/u ov/Olivette Beckmann re: UACS post covid 19  Late Aug 2020  Chief Complaint  Patient presents with  . Follow-up    Cough is some better but has not resolved. When she is able to rest her voice and not exert herself she coughs less.    Dyspnea: mostly related to cough - hard to sort out  Cough: incessant harsh dry day > Noct and worse with voice use Sleeping: some better p h1 hs  SABA use: none  02: none   Other than with voice use >> no obvious other patterns in day to day or daytime variability or assoc excess/ purulent sputum or mucus plugs or hemoptysis or cp or chest tightness, subjective wheeze or overt sinus or hb symptoms.     Also denies any obvious fluctuation of symptoms with weather or environmental changes or other aggravating or alleviating factors except as outlined above   No unusual exposure hx or h/o childhood pna/ asthma or knowledge of premature birth.  Current Allergies, Complete Past Medical History, Past Surgical History, Family History, and Social History were reviewed in [employer] record.  ROS  The following are not active complaints unless bolded Hoarseness, sore throat, dysphagia, dental problems, itching, sneezing,  nasal congestion or discharge of excess mucus or purulent secretions, ear ache,   fever, chills, sweats, unintended wt loss or wt gain,  classically pleuritic or exertional cp,  orthopnea pnd or arm/hand swelling  or leg swelling, presyncope, palpitations, abdominal pain, anorexia, nausea, vomiting, diarrhea  or change in bowel habits or change in bladder habits, change in stools or change in urine, dysuria, hematuria,  rash, arthralgias, visual complaints, headache, numbness, weakness or ataxia or problems with walking  or coordination,  change in mood or  memory.        Current Meds  Medication Sig  . albuterol (PROVENTIL) (2.5 MG/3ML) 0.083% nebulizer solution Take 3 mLs (2.5 mg total) by nebulization every 6 (six) hours as needed for wheezing or shortness of breath.  . benzonatate (TESSALON) 200 MG capsule Take 1 capsule (200 mg total) by mouth 3 (three) times daily as needed for cough.  . Biotin 5 MG CAPS Take 1 capsule (5 mg total) by mouth daily.  . budesonide-formoterol (SYMBICORT) 160-4.5 MCG/ACT inhaler Inhale 2 puffs into the lungs 2 (two) times daily.  . CHLORPHENIRAMINE-ACETAMINOPHEN PO Take 4 mg by mouth at bedtime.  Marland Kitchen Dextromethorphan Polistirex (DELSYM PO) Take by mouth daily.  . famotidine (PEPCID) 20 MG tablet One after supper  . fexofenadine (ALLEGRA) 180 MG tablet Take 180 mg by mouth daily.  . pantoprazole (PROTONIX) 40 MG tablet Take 1 tablet (40 mg total) by mouth daily. Take 30-60 min before first meal of the day  . traMADol (ULTRAM) 50 MG tablet Take 50 mg by mouth daily as needed.                      Past Medical History:  Diagnosis Date  . Allergy   . Asthma   . Cardiac disease   . Diffuse cystic mastopathy   . GERD (gastroesophageal reflux disease)   . Guttate psoriasis   . H/O syncope    cards work-up WNL (Hochrein) thought 2/2 overmedication vs arrhythmias  . Hx of endometriosis    hysterectomy  . Hx of migraines    occasional; improved since hysterectomy  . Labral tear of shoulder 02/2014   R, pending surgery Tamera Punt)  . Rosacea    mild       Objective:      amb wf  with non-stop dry coughing fits   02/18/2019       204   11/21/18 198 lb 12.8 oz (90.2 kg)  10/24/18 196 lb 5 oz (89 kg)  10/03/18 190 lb (86.2 kg)    Vital signs reviewed  02/18/2019  - Note at rest 02 sats  95% on RA      HEENT : pt wearing mask not removed for exam due to covid -19 concerns.    NECK :  without JVD/Nodes/TM/ nl carotid upstrokes bilaterally   LUNGS: no acc muscle use,  Nl contour chest which is clear to A and P bilaterally without cough on insp or exp maneuvers   CV:  RRR  no s3 or murmur or increase in P2, and no edema   ABD:  soft and nontender with nl inspiratory excursion in the supine position. No bruits or organomegaly appreciated, bowel sounds nl  MS:  Nl gait/ ext warm without deformities, calf tenderness, cyanosis or clubbing No obvious joint restrictions   SKIN: warm and dry without lesions    NEURO:  alert, approp, nl sensorium with  no motor or cerebellar deficits apparent.         I personally reviewed images and agree with radiology impression as follows:   Chest HRCT  12/10/2018 1. No evidence of fibrotic interstitial lung disease. 2. Mild, lobular air trapping on expiratory phase imaging, suggestive of small airways disease.          Assessment

## 2019-02-18 NOTE — Patient Instructions (Addendum)
Gabapentin 100 mg four times daily   Continue pantoprazole 30 min before breakfast and famotidine after supper   Take delsym two tsp every 12 hours and supplement if needed with  Tylenol #4 every 4 hours to suppress the urge to cough. Swallowing water and/or using ice chips/non mint and menthol containing candies (such as lifesavers or sugarless jolly ranchers) are also effective.  You should rest your voice and avoid activities that you know make you cough.  Once you have eliminated the cough for 3 straight days try reducing the tylenol #4  first,  then the delsym as tolerated.    Prednisone Take 4 for two days three for two days two for two days one for two days   Stop symbicort/ tessalon Cassie [name]   For drainage / throat tickle try take CHLORPHENIRAMINE  4 mg  (Chlortab 4mg   at [employer] should be easiest to find in the green box)  take one every 4 hours as needed - available over the counter- may cause drowsiness so start with just a bedtime dose or two and see how you tolerate it before trying in daytime     Please schedule a follow up office visit in 2 weeks, call sooner if needed with all medications /inhalers/ solutions in hand so we can verify exactly what you are taking. This includes all medications from all doctors and over the [place] separate them into two bags:  the ones you take automatically, no matter what, vs the ones you take just when you feel you need them "BAG #2 is UP TO YOU"  - this will really help [place] help you take your medications more effectively.

## 2019-02-18 NOTE — Progress Notes (Signed)
Patient ID: Cassie [name], female   DOB: Jun 23, 1970, 49 y.o.   MRN: [id number] Patient enrolled for 14 day ZIO XT long term holter monitor to be mailed to her home.

## 2019-02-19 ENCOUNTER — Other Ambulatory Visit (HOSPITAL_COMMUNITY): Payer: BC Managed Care – PPO

## 2019-02-19 ENCOUNTER — Encounter: Payer: Self-pay | Admitting: Internal Medicine

## 2019-02-19 NOTE — Assessment & Plan Note (Addendum)
Onset in late  Aug 2020 with Covid 19 pos 08/30/18 not admitted  - cyclical cough rx [id number] > partial improvement  -HRCT chest 12/10/18 >No evidence of fibrotic interstitial lung disease. 2. Mild, lobular air trapping on expiratory phase imaging,suggestive of small airways disease. - repeat cyclical cough rx with Tyl #4 and add gabapentin 100 qid.   Of the three most common causes of  Sub-acute / recurrent or chronic cough, only one (GERD)  can actually contribute to/ trigger  the other two (asthma and post nasal drip syndrome)  and perpetuate the cylce of cough.  While not intuitively obvious, many patients with chronic low grade reflux do not cough until there is a primary insult that disturbs the protective epithelial barrier and exposes sensitive nerve endings.   This is typically viral but can due to PNDS and  either may apply here.  >>>>   The point is that once this occurs, it is difficult to eliminate the cycle  using anything but a maximally effective acid suppression regimen at least in the short run, accompanied by an appropriate diet to address non acid GERD and control / eliminate the cough itself for at least 3 days with codeine and maintain on gabapentin titrated as high as 300 qid if needed while elimintating pnds with 1st gen H1 blockers per guidelines and also added 8 days of Prednisone in case of component of Th-2 driven upper or lower airways inflammation (if cough responds short term only to relapse befor return while will on rx for uacs that would point to allergic rhinitis/ asthma or eos bronchitis)    At this point no evidence for asthma so stop symbicort and all other ineffective cough / rhinitis meds including otc's and use hard rock candy to suppress urge to clear throat as well as strict voice rest/ advised.  Use only the neb if needed if helps breathing or coughing.   F/u will be q 2 weeks with all meds in hand using a trust but verify approach to confirm accurate  Medication  Reconciliation The principal here is that until we are certain that the  patients are doing what we've asked, it makes no sense to ask them to do more.   To keep things simple, I have asked the patient to first separate medicines that are perceived as maintenance, that is to be taken daily "no matter what", from those medicines that are taken on only on an as-needed basis and I have given the patient examples of both, and then return with the meds organized as above if she wants me to "take ownership" of this problem for her.        Each maintenance medication was reviewed in detail including emphasizing most importantly the difference between maintenance and prns and under what circumstances the prns are to be triggered using an action plan format where appropriate.  Total time for H and P, chart review, counseling, teaching device and generating customized AVS unique to this office visit / charting = 40 min

## 2019-02-20 ENCOUNTER — Encounter: Payer: Self-pay | Admitting: Physician Assistant

## 2019-02-24 ENCOUNTER — Ambulatory Visit (INDEPENDENT_AMBULATORY_CARE_PROVIDER_SITE_OTHER): Payer: BC Managed Care – PPO

## 2019-02-24 DIAGNOSIS — L650 Telogen effluvium: Secondary | ICD-10-CM | POA: Diagnosis not present

## 2019-02-24 DIAGNOSIS — L308 Other specified dermatitis: Secondary | ICD-10-CM | POA: Diagnosis not present

## 2019-02-24 DIAGNOSIS — R55 Syncope and collapse: Secondary | ICD-10-CM | POA: Diagnosis not present

## 2019-02-24 DIAGNOSIS — Z8616 Personal history of COVID-19: Secondary | ICD-10-CM | POA: Diagnosis not present

## 2019-03-05 ENCOUNTER — Encounter: Payer: Self-pay | Admitting: Internal Medicine

## 2019-03-05 ENCOUNTER — Telehealth: Payer: Self-pay | Admitting: Cardiology

## 2019-03-05 ENCOUNTER — Encounter: Payer: Self-pay | Admitting: *Deleted

## 2019-03-05 ENCOUNTER — Telehealth: Payer: Self-pay | Admitting: Internal Medicine

## 2019-03-05 ENCOUNTER — Ambulatory Visit: Payer: BC Managed Care – PPO | Admitting: Internal Medicine

## 2019-03-05 ENCOUNTER — Other Ambulatory Visit: Payer: Self-pay

## 2019-03-05 DIAGNOSIS — R05 Cough: Secondary | ICD-10-CM

## 2019-03-05 DIAGNOSIS — R058 Other specified cough: Secondary | ICD-10-CM

## 2019-03-05 MED ORDER — GABAPENTIN 300 MG PO CAPS: 300.0000 mg | ORAL_CAPSULE | Freq: Four times a day (QID) | ORAL | 2 refills | Status: DC

## 2019-03-05 MED ORDER — PREDNISONE 10 MG PO TABS: ORAL_TABLET | ORAL | 0 refills | Status: DC

## 2019-03-05 NOTE — Progress Notes (Addendum)
Cassie [name], female    DOB: Jan 20, 1970,    MRN: [id number]   Brief patient profile:  83   yowf grew up in [place]/ [place] came [place] to [place] and stayed, stopped smoking completely 2009 but really ever much of smoker and some springtime rhinitis worse since in  [place] and [place] winter time "bronchial asthma" on short term saba then dx covid 19 Aug 30 2018 req ER eval  09/15/2018 >  Prednisone / albuterol  > transiently better but still no voice and cough >repeat x 3 total pred each time about 50% then relapase w/in a week and last dose 11/12/2018 and worse since so referred to pulmonary clinic 11/21/2018 by Dr   Danise Mina.     History of Present Illness  11/21/2018  Pulmonary/ 1st office eval/Oluwaseyi Tull  Chief Complaint  Patient presents with  . Consult    Patient was diagnosed with Covid in Aug and hasn't had a voice since Aug and has had shortness of breath. Patient feels like she is getting worse since its getting colder out side. Patient states she still has a cough but is not productive. Patient states she still feels tired all the time but worse with exertion. Patient also states she has bruising that has poped up on her skin without injury.  Dyspnea:  100 ft or so Cough: daytime with walking or activity,  W/in hour of hs  Despite 2 pillows, no sputum Taking advair x 2weeks/ no better  saba 2 bid and neb  Tessalon helps some but only using at hs rec First take delsym two tsp every 12 hours and supplement if needed with  tramadol 50 mg up to 2 every 4 hours to suppress the urge to cough at all or even clear your throat.   Once you have eliminated the cough for 3 straight days try reducing the tramadol first,  then the delsym as tolerated.   Prednisone 10 mg take  4 each am x 2 days,   2 each am x 2 days,  1 each am x 2 days and stop (this is to eliminate allergies and inflammation from coughing) Protonix (pantoprazole) Take 30-60 min before first meal of the day and Pepcid 20 mg one hour  before bedtime until return  Stop advair and albuterol inhaler Only use your albuterol nebulizer as a rescue medication to be used if you can't catch your breath by resting   - The less you use it, the better it will work when you need it. - Ok to use up to one time  every 4 hours if you must but call for immediate appointment if use goes up over your usual need  GERD  Diet    12/01/2018 NP no better Delsym 2 tsp Twice daily  For cough  Tessalon Three times a day  For cough  Tramadol 50mg  1-2 every 4 hr as needed for severe cough , may make you sleep  Begin Chlorpheniramine 4mg  2 At bedtime  .  May use extra Chlorpheniramine 4mg  every 4hr as needed for drainage , throat clearing, use with caution as may make you sleepy.  Begin Allegra 180mg  daily in am .  Stop Tussionex.  Voice rest , sips of water to soothe throat  Avoid throat clearing. No singing .  Continue on Protonix and Pepcid.  Albuterol Neb every 4hr as needed for wheezing /shortness of breath .   12/08/2018 NP : no better Chest xray today .  Symbicort 2  puffs Twice daily  With spacer , rinse after use.  Delsym 2 tsp Twice daily  For cough  Tessalon Three times a day  For cough  Tramadol 50mg  1-2 every 4 hr as needed for severe cough , may make you sleep  Chlorpheniramine 4mg  2 At bedtime  .  May use extra Chlorpheniramine 4mg  every 4hr as needed for drainage , throat clearing, use with caution as may make you sleepy.  Allegra 180mg  daily in am .  Voice rest , sips of water to soothe throat  Avoid throat clearing. No singing .  Continue on Protonix and Pepcid.  Albuterol Neb every 4hr as needed for wheezing /shortness of breath .  Follow up with ENT as planned     12/22/2018 Prednisone taper over next week.  Go for Speech evaluation  Symbicort 2 puffs Twice daily  With spacer , rinse after use.  Delsym 2 tsp Twice daily  For cough  Tessalon Three times a day  For cough  Tramadol 50mg  1-2 every 4 hr as needed for  severe cough , may make you sleep  Chlorpheniramine 4mg  2 At bedtime  .  May use extra Chlorpheniramine 4mg  every 4hr as needed for drainage , throat clearing, use with caution as may make you sleepy.  Allegra 180mg  daily in am .  Voice rest , sips of water to soothe throat  Avoid throat clearing. No singing .  Continue on Protonix and Pepcid.  Albuterol Neb every 4hr as needed for wheezing /shortness of breath.  Follow up in  6  weeks with Dr. Melvyn Novas  Or Parrett NP and As needed   Please contact office for sooner follow up if symptoms do not improve or worsen or seek emergency care    02/18/2019  f/u ov/Cassie [name] re: UACS post covid 19  Late Aug 2020  Chief Complaint  Patient presents with  . Follow-up    Cough is some better but has not resolved. When she is able to rest her voice and not exert herself she coughs less.    Dyspnea: mostly related to cough - hard to sort out  Cough: incessant harsh dry day > Noct and worse with voice use Sleeping: some better p h1 hs  rec Gabapentin 100 mg four times daily  Continue pantoprazole 30 min before breakfast and famotidine after supper  Take delsym two tsp every 12 hours and supplement if needed with  Tylenol #4 every 4 hours to suppress the urge to cough.   Once you have eliminated the cough for 3 straight days try reducing the tylenol #4  first,  then the delsym as tolerated.   Prednisone Take 4 for two days three for two days two for two days one for two days  Stop symbicort/ tessalon [name]  For drainage / throat tickle try take CHLORPHENIRAMINE  4 mg  (Chlortab 4mg   at [employer] should be easiest to find in the green box)  take one every 4 hours as needed   Please schedule a follow up office visit in 2 weeks, call sooner if needed with all medications /inhalers/ solutions in hand     03/05/2019  f/u ov/Cassie [name] re: cough post covid aug 2021 with nl ct chest / brought all meds  Chief Complaint  Patient presents with  . Follow-up     Cough is sme better but has not completely resolved.   Dyspnea:  While exerting 02 is fine/ but ex tol better but still coughing Cough:  dry hack better p tyl #4 just bid never more than 4 daily x maybe 2 days / thinks pred helps the most  Sleeping:  Taking Tyl # 4 hs  And sleeping until around 3 am p 1st gen H1 blockers per guidelines  But never takes more than one at hs / two pillows  SABA use: none/ not sure even albuterol neb helped  02: no   No obvious day to day or daytime variability or assoc excess/ purulent sputum or mucus plugs or hemoptysis or cp or chest tightness, subjective wheeze or overt sinus or hb symptoms.     Also denies any obvious fluctuation of symptoms with weather or environmental changes or other aggravating or alleviating factors except as outlined above   No unusual exposure hx or h/o childhood pna/ asthma or knowledge of premature birth.  Current Allergies, Complete Past Medical History, Past Surgical History, Family History, and Social History were reviewed in [facility] record.  ROS  The following are not active complaints unless bolded Hoarseness, sore throat, dysphagia, dental problems, itching, sneezing,  nasal congestion or discharge of excess mucus or purulent secretions, ear ache,   fever, chills, sweats, unintended wt loss or wt gain, classically pleuritic or exertional cp,  orthopnea pnd or arm/hand swelling  or leg swelling, presyncope, palpitations, abdominal pain, anorexia, nausea, vomiting, diarrhea  or change in bowel habits or change in bladder habits, change in stools or change in urine, dysuria, hematuria,  rash, arthralgias, visual complaints, headache, numbness, weakness or ataxia or problems with walking or coordination,  change in mood or  memory.        Current Meds  Medication Sig  . acetaminophen-codeine (TYLENOL #4) 300-60 MG tablet Take 1 tablet by mouth every 4 (four) hours as needed for moderate pain.  Marland Kitchen albuterol  (PROVENTIL) (2.5 MG/3ML) 0.083% nebulizer solution Take 3 mLs (2.5 mg total) by nebulization every 6 (six) hours as needed for wheezing or shortness of breath.  . Biotin 5 MG CAPS Take 1 capsule (5 mg total) by mouth daily.  . cholecalciferol (VITAMIN D3) 25 MCG (1000 UNIT) tablet Take 1,000 Units by mouth daily.  Marland Kitchen Dextromethorphan Polistirex (DELSYM PO) Take by mouth daily.  . famotidine (PEPCID) 20 MG tablet One after supper  . gabapentin (NEURONTIN) 100 MG capsule Take 1 capsule (100 mg total) by mouth 4 (four) times daily. One three times daily  . pantoprazole (PROTONIX) 40 MG tablet Take 1 tablet (40 mg total) by mouth daily. Take 30-60 min before first meal of the day               Past Medical History:  Diagnosis Date  . Allergy   . Asthma   . Cardiac disease   . Diffuse cystic mastopathy   . GERD (gastroesophageal reflux disease)   . Guttate psoriasis   . H/O syncope    cards work-up WNL (Hochrein) thought 2/2 overmedication vs arrhythmias  . Hx of endometriosis    hysterectomy  . Hx of migraines    occasional; improved since hysterectomy  . Labral tear of shoulder 02/2014   R, pending surgery Tamera Punt)  . Rosacea    mild       Objective:     amb wf nad no longer coughing quite hoarse / cough on insp/exp and pseudowheeze bertter with plm   03/05/2019         207  02/18/2019       204   11/21/18  198 lb 12.8 oz (90.2 kg)  10/24/18 196 lb 5 oz (89 kg)  10/03/18 190 lb (86.2 kg)    Vital signs reviewed  03/05/2019  - Note at rest 02 sats  98% on RA    HEENT : pt wearing mask not removed for exam due to covid -19 concerns.    NECK :  without JVD/Nodes/TM/ nl carotid upstrokes bilaterally   LUNGS: no acc muscle use,  Nl contour chest which is clear to A and P bilaterally without cough on insp or exp maneuvers   CV:  RRR  no s3 or murmur or increase in P2, and no edema   ABD:  soft and nontender with nl inspiratory excursion in the supine position. No bruits  or organomegaly appreciated, bowel sounds nl  MS:  Nl gait/ ext warm without deformities, calf tenderness, cyanosis or clubbing No obvious joint restrictions   SKIN: warm and dry without lesions    NEURO:  alert, approp, nl sensorium with  no motor or cerebellar deficits apparent.                      Assessment

## 2019-03-05 NOTE — Patient Instructions (Addendum)
Gabapentin 100 mg  X 2  Four times a day then up to 300 mg four times a day when you finish the 200s   Prednisone take 4 for three days 3 for three days 2 for three days 1 for three days and stop   For drainage / throat tickle try take CHLORPHENIRAMINE  4 mg  (Chlortab 4mg   at [employer] should be easiest to find in the green box)  take one every 4 hours as needed - available over the counter- may cause drowsiness so start with just a bedtime dose or two and see how you tolerate it before trying in daytime    Keep the candy handy and rest voice   Ok to use albuterol 2.5 mg every to every 4 hours if helps    No work x 2 weeks  Please schedule a follow up office visit in 2  weeks, sooner if needed

## 2019-03-05 NOTE — Telephone Encounter (Signed)
Spoke with patient. Patient will be in [place] on the 18th and unable to attend her appointment. She is also still wearing her heart monitor. Patient moved to 04/10/2019. Advised patient to call back to see an APP if she is having symptoms that need to be assessed prior to her next appointment. Patient verbalized understanding.

## 2019-03-05 NOTE — Telephone Encounter (Signed)
I corrected her work note and entered into her mychart account per her request

## 2019-03-05 NOTE — Assessment & Plan Note (Signed)
Onset in late  Aug 2020 with Covid 19 pos 08/30/18 not admitted  - cyclical cough rx [id number] > partial improvement  -HRCT chest 12/10/18 >No evidence of fibrotic interstitial lung disease. 2. Mild, lobular air trapping on expiratory phase imaging,suggestive of small airways disease. - repeat cyclical cough rx with Tyl #4 and add gabapentin 100 qid > some better 03/05/2019 so rec titrate up to 300 mg qid / pred x 12 days   Of the three most common causes of  Sub-acute / recurrent or chronic cough, only one (GERD)  can actually contribute to/ trigger  the other two (asthma and post nasal drip syndrome)  and perpetuate the cylce of cough.  While not intuitively obvious, many patients with chronic low grade reflux do not cough until there is a primary insult that disturbs the protective epithelial barrier and exposes sensitive nerve endings.   This is typically viral but can due to PNDS and  either may apply here.   The point is that once this occurs, it is difficult to eliminate the cycle  using anything but a maximally effective acid suppression regimen at least in the short run, accompanied by an appropriate diet to address non acid GERD and control / eliminate the cough itself by titrating up gabapentin to max dose 300 mg qid if tol plus  Also added 12 days of Prednisone in case of component of Th-2 driven upper or lower airways inflammation (if cough responds short term only to relapse before return while will on rx for uacs that would point to allergic rhinitis/ asthma or eos bronchitis)     >>> f/u 2 weeks         Each maintenance medication was reviewed in detail including emphasizing most importantly the difference between maintenance and prns and under what circumstances the prns are to be triggered using an action plan format where appropriate.  Total time for H and P, chart review, counseling,  and generating customized AVS unique to this office visit / charting = 30 min

## 2019-03-05 NOTE — Telephone Encounter (Signed)
Cassie [name] is calling stating she received her monitor late and is wanting to push her appointment scheduled for 03/19/19 back a week. I advised Cassie [name] Dr. Percival Spanish does not having anything available before April and offered an appointment with a PA. She stated she would only accept an appointment with the PA if Dr. Percival Spanish was in office the same day. I advised the patient I could give her a virtual appt with a PA on the days she was requesting or an in office in the beginning of April. She refused both and decided to keep the appointment she already has for now. Cassie [name] is requesting Dr. Percival Spanish give her a call in regards to this to get her worked in. Please advise.

## 2019-03-07 ENCOUNTER — Other Ambulatory Visit: Payer: Self-pay | Admitting: Internal Medicine

## 2019-03-07 DIAGNOSIS — R058 Other specified cough: Secondary | ICD-10-CM

## 2019-03-07 DIAGNOSIS — R05 Cough: Secondary | ICD-10-CM

## 2019-03-10 ENCOUNTER — Ambulatory Visit: Payer: BC Managed Care – PPO | Admitting: Cardiology

## 2019-03-17 ENCOUNTER — Encounter: Payer: Self-pay | Admitting: *Deleted

## 2019-03-17 ENCOUNTER — Other Ambulatory Visit: Payer: Self-pay

## 2019-03-17 ENCOUNTER — Ambulatory Visit: Payer: BC Managed Care – PPO | Admitting: Internal Medicine

## 2019-03-17 ENCOUNTER — Encounter: Payer: Self-pay | Admitting: Internal Medicine

## 2019-03-17 DIAGNOSIS — R55 Syncope and collapse: Secondary | ICD-10-CM | POA: Diagnosis not present

## 2019-03-17 DIAGNOSIS — R058 Other specified cough: Secondary | ICD-10-CM

## 2019-03-17 DIAGNOSIS — R05 Cough: Secondary | ICD-10-CM

## 2019-03-17 LAB — CBC WITH DIFFERENTIAL/PLATELET
Basophils Absolute: 0.1 10*3/uL (ref 0.0–0.1)
Basophils Relative: 0.6 % (ref 0.0–3.0)
Eosinophils Absolute: 0.2 10*3/uL (ref 0.0–0.7)
Eosinophils Relative: 2.2 % (ref 0.0–5.0)
HCT: 39.8 % (ref 36.0–46.0)
Hemoglobin: 13.9 g/dL (ref 12.0–15.0)
Lymphocytes Relative: 15.3 % (ref 12.0–46.0)
Lymphs Abs: 1.3 10*3/uL (ref 0.7–4.0)
MCHC: 34.9 g/dL (ref 30.0–36.0)
MCV: 96.2 fl (ref 78.0–100.0)
Monocytes Absolute: 0.6 10*3/uL (ref 0.1–1.0)
Monocytes Relative: 7 % (ref 3.0–12.0)
Neutro Abs: 6.5 10*3/uL (ref 1.4–7.7)
Neutrophils Relative %: 74.9 % (ref 43.0–77.0)
Platelets: 231 10*3/uL (ref 150.0–400.0)
RBC: 4.14 Mil/uL (ref 3.87–5.11)
RDW: 14 % (ref 11.5–15.5)
WBC: 8.6 10*3/uL (ref 4.0–10.5)

## 2019-03-17 MED ORDER — TRAMADOL HCL 50 MG PO TABS: 50.0000 mg | ORAL_TABLET | ORAL | 2 refills | Status: AC | PRN

## 2019-03-17 MED ORDER — MONTELUKAST SODIUM 10 MG PO TABS: ORAL_TABLET | ORAL | 2 refills | Status: DC

## 2019-03-17 MED ORDER — PREDNISONE 10 MG PO TABS: ORAL_TABLET | ORAL | 0 refills | Status: DC

## 2019-03-17 NOTE — Patient Instructions (Addendum)
Continue gabapenitin  300 mg four times a day   Take delsym two tsp every 12 hours and supplement if needed with  tramadol 50 mg up to 2 every 4 hours to 100% eliminate the urge to cough. Swallowing water and/or using ice chips/non mint and menthol containing candies (such as lifesavers or sugarless jolly ranchers) are also effective.  You should rest your voice and avoid activities that you know make you cough.  Once you have eliminated the cough for 3 straight days try reducing the tramadol first,  then the delsym as tolerated.   Take Prednisone 10 mg  4 for three days 3 for three days 2 for three days 1 for three days and stop   Singulair 10 mg one daily   Add albuterol up to 2.5 mg every 4 hours if needed if you think it helps any of your symptoms   Please remember to go to the lab department   for your tests - we will call you with the results when they are available.  Call next week if not 100% able to eliminate the cough for referral to Dr Lamar Benes   If better, see you here in 4 weeks

## 2019-03-17 NOTE — Progress Notes (Signed)
Cassie [name], female    DOB: 1970/02/06,    MRN: [id number]   Brief patient profile:  29   yowf grew up in [place]/ [place] came [place] to [place] and stayed, stopped smoking completely 2009 but really ever much of smoker and some springtime rhinitis worse since in  [place] and assoc winter time "bronchial asthma" on short term saba then dx covid 19 Aug 30 2018 req ER eval  09/15/2018 >  Prednisone / albuterol  > transiently better but still no voice and cough >repeat x 3 total pred each time about 50% then relapase w/in a week and last dose 11/12/2018 and worse since so referred to pulmonary clinic 11/21/2018 by Dr   Danise Mina.     History of Present Illness  11/21/2018  Pulmonary/ 1st office eval/Cassie [name]  Chief Complaint  Patient presents with  . Consult    Patient was diagnosed with Covid in Aug and hasn't had a voice since Aug and has had shortness of breath. Patient feels like she is getting worse since its getting colder out side. Patient states she still has a cough but is not productive. Patient states she still feels tired all the time but worse with exertion. Patient also states she has bruising that has poped up on her skin without injury.  Dyspnea:  100 ft or so Cough: daytime with walking or activity,  W/in hour of hs  Despite 2 pillows, no sputum Taking advair x 2weeks/ no better  saba 2 bid and neb  Tessalon helps some but only using at hs rec First take delsym two tsp every 12 hours and supplement if needed with  tramadol 50 mg up to 2 every 4 hours to suppress the urge to cough at all or even clear your throat.   Once you have eliminated the cough for 3 straight days try reducing the tramadol first,  then the delsym as tolerated.   Prednisone 10 mg take  4 each am x 2 days,   2 each am x 2 days,  1 each am x 2 days and stop (this is to eliminate allergies and inflammation from coughing) Protonix (pantoprazole) Take 30-60 min before first meal of the day and Pepcid 20 mg one hour  before bedtime until return  Stop advair and albuterol inhaler Only use your albuterol nebulizer as a rescue medication to be used if you can't catch your breath by resting   - The less you use it, the better it will work when you need it. - Ok to use up to one time  every 4 hours if you must but call for immediate appointment if use goes up over your usual need  GERD  Diet    12/01/2018 NP no better Delsym 2 tsp Twice daily  For cough  Tessalon Three times a day  For cough  Tramadol 50mg  1-2 every 4 hr as needed for severe cough , may make you sleep  Begin Chlorpheniramine 4mg  2 At bedtime  .  May use extra Chlorpheniramine 4mg  every 4hr as needed for drainage , throat clearing, use with caution as may make you sleepy.  Begin Allegra 180mg  daily in am .  Stop Tussionex.  Voice rest , sips of water to soothe throat  Avoid throat clearing. No singing .  Continue on Protonix and Pepcid.  Albuterol Neb every 4hr as needed for wheezing /shortness of breath .   12/08/2018 NP : no better Chest xray today .  Symbicort 2  puffs Twice daily  With spacer , rinse after use.  Delsym 2 tsp Twice daily  For cough  Tessalon Three times a day  For cough  Tramadol 50mg  1-2 every 4 hr as needed for severe cough , may make you sleep  Chlorpheniramine 4mg  2 At bedtime  .  May use extra Chlorpheniramine 4mg  every 4hr as needed for drainage , throat clearing, use with caution as may make you sleepy.  Allegra 180mg  daily in am .  Voice rest , sips of water to soothe throat  Avoid throat clearing. No singing .  Continue on Protonix and Pepcid.  Albuterol Neb every 4hr as needed for wheezing /shortness of breath .  Follow up with ENT as planned     12/22/2018 Prednisone taper over next week.  Go for Speech evaluation  Symbicort 2 puffs Twice daily  With spacer , rinse after use.  Delsym 2 tsp Twice daily  For cough  Tessalon Three times a day  For cough  Tramadol 50mg  1-2 every 4 hr as needed for  severe cough , may make you sleep  Chlorpheniramine 4mg  2 At bedtime  .  May use extra Chlorpheniramine 4mg  every 4hr as needed for drainage , throat clearing, use with caution as may make you sleepy.  Allegra 180mg  daily in am .  Voice rest , sips of water to soothe throat  Avoid throat clearing. No singing .  Continue on Protonix and Pepcid.  Albuterol Neb every 4hr as needed for wheezing /shortness of breath.  Follow up in  6  weeks with Dr. Melvyn Novas  Or [place] NP and As needed   Please contact office for sooner follow up if symptoms do not improve or worsen or seek emergency care    02/18/2019  f/u ov/Cassie [name] re: UACS post covid 19  Late Aug 2020  Chief Complaint  Patient presents with  . Follow-up    Cough is some better but has not resolved. When she is able to rest her voice and not exert herself she coughs less.    Dyspnea: mostly related to cough - hard to sort out  Cough: incessant harsh dry day > Noct and worse with voice use Sleeping: some better p h1 hs  rec Gabapentin 100 mg four times daily  Continue pantoprazole 30 min before breakfast and famotidine after supper  Take delsym two tsp every 12 hours and supplement if needed with  Tylenol #4 every 4 hours to suppress the urge to cough.   Once you have eliminated the cough for 3 straight days try reducing the tylenol #4  first,  then the delsym as tolerated.   Prednisone Take 4 for two days three for two days two for two days one for two days  Stop symbicort/ tessalon [name]  For drainage / throat tickle try take CHLORPHENIRAMINE  4 mg  (Chlortab 4mg   at [employer] should be easiest to find in the green box)  take one every 4 hours as needed   Please schedule a follow up office visit in 2 weeks, call sooner if needed with all medications /inhalers/ solutions in hand     03/05/2019  f/u ov/Cassie [name] re: cough post covid aug 2020 with nl ct chest / brought all meds  Chief Complaint  Patient presents with  . Follow-up     Cough is sme better but has not completely resolved.   Dyspnea:  While exerting 02 is fine/ but ex tol better but still coughing Cough:  dry hack better p tyl #4 just bid never more than 4 daily x maybe 2 days / thinks pred helps the most  Sleeping:  Taking Tyl # 4 hs  And sleeping until around 3 am p 1st gen H1 blockers per guidelines  But never takes more than one at hs / two pillows  SABA use: none/ not sure even albuterol neb helped  02: no rec Gabapentin 100 mg  X 2  Four times a day then up to 300 mg four times a day when you finish the 200s  Prednisone take 4 for three days 3 for three days 2 for three days 1 for three days and stop  For drainage / throat tickle try take CHLORPHENIRAMINE  4 mg  (Chlortab 4mg   at [employer] should be easiest to find in the green box)  take one every 4 hours as needed - available over the counter- may cause drowsiness so start with just a bedtime dose or two and see how you tolerate it before trying in daytime   Keep the candy handy and rest voice  Ok to use albuterol 2.5 mg every to every 4 hours if helps  No work x 2 weeks   03/17/2019  f/u ov/Cassie [name] re: cough since aug 2020 /never able to eliminate  Chief Complaint  Patient presents with  . Follow-up    Cough was some better while on pred and now worse since off x 2 days.   Dyspnea:  Mostly limited by  the cough  Cough: dry / never completely  gone even on prednisone though it did the best Sleeping:  SABA use:  Only used albuterol neb twice since last ov "helps some"  02: none    No obvious day to day or daytime variability or assoc excess/ purulent sputum or mucus plugs or hemoptysis or cp or chest tightness, subjective wheeze or overt sinus or hb symptoms.     Also denies any obvious fluctuation of symptoms with weather or environmental changes or other aggravating or alleviating factors except as outlined above   No unusual exposure hx or h/o childhood pna/ asthma or knowledge of  premature birth.  Current Allergies, Complete Past Medical History, Past Surgical History, Family History, and Social History were reviewed in [employer] record.  ROS  The following are not active complaints unless bolded Hoarseness, sore throat, dysphagia, dental problems, itching, sneezing,  nasal congestion or discharge of excess mucus or purulent secretions, ear ache,   fever, chills, sweats, unintended wt loss or wt gain, classically pleuritic or exertional cp,  orthopnea pnd or arm/hand swelling  or leg swelling, presyncope, palpitations, abdominal pain, anorexia, nausea, vomiting, diarrhea  or change in bowel habits or change in bladder habits, change in stools or change in urine, dysuria, hematuria,  rash, arthralgias, visual complaints, headache, numbness, weakness or ataxia or problems with walking or coordination,  change in mood or  memory.      Current Meds  Medication Sig  . acetaminophen-codeine (TYLENOL #4) 300-60 MG tablet Take 1 tablet by mouth every 4 (four) hours as needed for moderate pain.  Marland Kitchen albuterol (PROVENTIL) (2.5 MG/3ML) 0.083% nebulizer solution Take 3 mLs (2.5 mg total) by nebulization every 6 (six) hours as needed for wheezing or shortness of breath.  . Biotin 5 MG CAPS Take 1 capsule (5 mg total) by mouth daily.  . cholecalciferol (VITAMIN D3) 25 MCG (1000 UNIT) tablet Take 1,000 Units by mouth daily.  Marland Kitchen Dextromethorphan  Polistirex (DELSYM PO) Take by mouth daily.  . famotidine (PEPCID) 20 MG tablet One after supper  . gabapentin (NEURONTIN) 300 MG capsule Take 1 capsule (300 mg total) by mouth 4 (four) times daily.  . pantoprazole (PROTONIX) 40 MG tablet TAKE 1 TABLET (40 MG TOTAL) BY MOUTH DAILY. TAKE 30-60 MIN BEFORE FIRST MEAL OF THE DAY                Past Medical History:  Diagnosis Date  . Allergy   . Asthma   . Cardiac disease   . Diffuse cystic mastopathy   . GERD (gastroesophageal reflux disease)   . Guttate psoriasis    . H/O syncope    cards work-up WNL (Hochrein) thought 2/2 overmedication vs arrhythmias  . Hx of endometriosis    hysterectomy  . Hx of migraines    occasional; improved since hysterectomy  . Labral tear of shoulder 02/2014   R, pending surgery Tamera Punt)  . Rosacea    mild       Objective:     Distraught amb wf with honking upper airway pattern cough  03/17/2019       207  03/05/2019         207  02/18/2019       204   11/21/18 198 lb 12.8 oz (90.2 kg)  10/24/18 196 lb 5 oz (89 kg)  10/03/18 190 lb (86.2 kg)    Vital signs reviewed  03/17/2019  - Note at rest 02 sats  96% on RA      HEENT : pt wearing mask not removed for exam due to covid -19 concerns.    NECK :  without JVD/Nodes/TM/ nl carotid upstrokes bilaterally   LUNGS: no acc muscle use,  Nl contour chest which is clear to A and P bilaterally without cough on insp or exp maneuvers   CV:  RRR  no s3 or murmur or increase in P2, and no edema   ABD:  soft and nontender with nl inspiratory excursion in the supine position. No bruits or organomegaly appreciated, bowel sounds nl  MS:  Nl gait/ ext warm without deformities, calf tenderness, cyanosis or clubbing No obvious joint restrictions   SKIN: warm and dry without lesions    NEURO:  alert, approp, nl sensorium with  no motor or cerebellar deficits apparent.           Labs ordered 03/17/2019  :  allergy profile         Assessment

## 2019-03-18 ENCOUNTER — Telehealth: Payer: Self-pay | Admitting: Internal Medicine

## 2019-03-18 LAB — IGE: IgE (Immunoglobulin E), Serum: 14 kU/L (ref <=114)

## 2019-03-18 NOTE — Telephone Encounter (Signed)
Spoke with patient. She wanted to make sure she was taking the correct amount of gabapentin. She picked up her RX yesterday but it was for the 100mg  instead of the 300mg  capsules. Reviewed her chart and advised her that on 03/05/19 MW had called in the 300mg  capsules for her. She stated that since she has already picked up the 100mg  capsules, she will take those and then refill the 300mg  capsules. Advised her to call us back if she had any issues getting her RX. She verbalized understanding.   Nothing further needed at time of call.

## 2019-03-19 ENCOUNTER — Encounter: Payer: Self-pay | Admitting: Internal Medicine

## 2019-03-19 ENCOUNTER — Ambulatory Visit: Payer: BC Managed Care – PPO | Admitting: Cardiology

## 2019-03-19 NOTE — Assessment & Plan Note (Signed)
Onset in late  Aug 2020 with Covid 19 pos 08/30/18 not admitted  - cyclical cough rx [id number] > partial improvement  -HRCT chest 12/10/18 >No evidence of fibrotic interstitial lung disease. 2. Mild, lobular air trapping on expiratory phase imaging,suggestive of small airways disease. - repeat cyclical cough rx with Tyl #4 and add gabapentin 100 qid > some better 03/05/2019 so rec titrate up to 300 mg qid / pred x 12 days  - Allergy profile 03/17/2019 >  Eos 0.2 /  IgE14  >>>    added singulair empirically   Of the three most common causes of  Sub-acute / recurrent or chronic cough, only one (GERD)  can actually contribute to/ trigger  the other two (asthma and post nasal drip syndrome)  and perpetuate the cylce of cough.  While not intuitively obvious, many patients with chronic low grade reflux do not cough until there is a primary insult that disturbs the protective epithelial barrier and exposes sensitive nerve endings.   This is typically viral but can due to PNDS and  either may apply here.   The point is that once this occurs, it is difficult to eliminate the cycle  using anything but a maximally effective acid suppression regimen at least in the short run, accompanied by an appropriate diet to address non acid GERD and eliminate pnds with 1st gen H1 blockers per guidelines  control / eliminate the cough itself for at least 3 days with tramadol and  Also added 12days of Prednisone in case of component of Th-2 driven upper or lower airways inflammation (if cough responds short term only to relapse befor return while will on rx for uacs that would point to allergic rhinitis/ asthma or eos bronchitis)            Each maintenance medication was reviewed in detail including emphasizing most importantly the difference between maintenance and prns and under what circumstances the prns are to be triggered using an action plan format where appropriate.  Total time for H and P, chart review, counseling,   and generating customized AVS unique to this office visit / charting = 20 min       If unable to control cough on this regimen > to [facility] / Dr Joya Gaskins next

## 2019-03-19 NOTE — Progress Notes (Signed)
Spoke with pt and notified of results per Dr. Melvyn Novas. Pt verbalized understanding. Her cough is not improving with the recs made at last visit despite following recs made at visit. Can we go ahead with referral to Dr Joya Gaskins now?

## 2019-03-20 ENCOUNTER — Other Ambulatory Visit: Payer: Self-pay | Admitting: Internal Medicine

## 2019-03-20 DIAGNOSIS — R05 Cough: Secondary | ICD-10-CM

## 2019-03-20 DIAGNOSIS — R058 Other specified cough: Secondary | ICD-10-CM

## 2019-03-25 ENCOUNTER — Telehealth: Payer: Self-pay | Admitting: Internal Medicine

## 2019-03-25 NOTE — Telephone Encounter (Signed)
Referral was made on 03/20/19 to Dr Joya Gaskins  I spoke with the pt and notified her that [facility] is working on this  Looks like Judeen Hammans has already called on 03/20/19 and left msg with their office

## 2019-04-06 DIAGNOSIS — J383 Other diseases of vocal cords: Secondary | ICD-10-CM | POA: Diagnosis not present

## 2019-04-06 DIAGNOSIS — Z8616 Personal history of COVID-19: Secondary | ICD-10-CM | POA: Diagnosis not present

## 2019-04-06 DIAGNOSIS — R490 Dysphonia: Secondary | ICD-10-CM | POA: Diagnosis not present

## 2019-04-06 DIAGNOSIS — R05 Cough: Secondary | ICD-10-CM | POA: Diagnosis not present

## 2019-04-06 DIAGNOSIS — J384 Edema of larynx: Secondary | ICD-10-CM | POA: Diagnosis not present

## 2019-04-07 DIAGNOSIS — F444 Conversion disorder with motor symptom or deficit: Secondary | ICD-10-CM | POA: Insufficient documentation

## 2019-04-07 DIAGNOSIS — J387 Other diseases of larynx: Secondary | ICD-10-CM | POA: Diagnosis not present

## 2019-04-07 DIAGNOSIS — R490 Dysphonia: Secondary | ICD-10-CM | POA: Diagnosis not present

## 2019-04-07 DIAGNOSIS — R05 Cough: Secondary | ICD-10-CM | POA: Diagnosis not present

## 2019-04-09 NOTE — Progress Notes (Signed)
Cardiology Office Note   Date:  04/10/2019   ID:  Cassie, [name] 08-15-1970, MRN [id number]  PCP:  Ria Bush, MD  Cardiologist:   Minus Breeding, MD   Chief Complaint  Patient presents with  . Dizziness      History of Present Illness: Cassie [name] is a 49 y.o. female who presents for evaluation of dizziness.  She had chest pain in the past.  She had normal coronary arteries on cardiac catheterization in 2006.  Myoview obtained on 02/10/2016 was low risk with EF of 78%, no reversible ischemia or infarction.  Echocardiogram obtained on the same day showed EF 60 to 65%, no significant valve disorder.  Patient was referred to cardiology service for evaluation of palpitation and dizziness.  She was diagnosed with COVID-19 in August 2020.  Since her diagnosis, she has had persistent dizziness upon standing.  She also had racing heartbeat that fluctuate to low heart rate.   Repeat echocardiogram obtained on 02/11/2019 showed EF 60 to 65%, normal pulmonary artery pressure, no significant valve disorder.  3 days ZIO monitor did not show significant arrhythmia. She wore a 14 day monitor and had rare supraventricular beats.     Unfortunately she is having "long haul" symptoms from her Covid infection.  She is having lots of cough and breathlessness.  She is recently seen ENT and is about to get some speech therapy.  From a cardiac standpoint she still having rapid heart rates.  We went through the monitor and she for the most part rates her heart rate down when she is sleeping but there are points during the day when he goes rapidly.  Some of this is at rest when she is doing nothing.  Some of it is related to cough but it is improved as her cough is improved slightly.  However, she still getting rapid heart rates and can feel fluttering in her chest.  She is having some orthostatic symptoms.  This is a little bit better as she is more careful about certain movements.  She was orthostatic  when she was at the doctor the other day she reported that she was not today.  She has not had any frank syncope which she did have previously.  Past Medical History:  Diagnosis Date  . Allergy   . Asthma   . Cardiac disease   . Diffuse cystic mastopathy   . GERD (gastroesophageal reflux disease)   . Guttate psoriasis   . H/O syncope    cards work-up WNL (Caileigh Canche) thought 2/2 overmedication vs arrhythmias  . Hx of endometriosis    hysterectomy  . Hx of migraines    occasional; improved since hysterectomy  . Labral tear of shoulder 02/2014   R, pending surgery Tamera Punt)  . Rosacea    mild    Past Surgical History:  Procedure Laterality Date  . BREAST BIOPSY  2010   left; core biopsy  . BREAST EXCISIONAL BIOPSY Bilateral    multiple bilateral  . BREAST MASS EXCISION  2008  . BREAST SURGERY Right    excision of lesion  . CARDIAC CATHETERIZATION  2007  . CHOLECYSTECTOMY  2008  . COLONOSCOPY  2006   WNL Nicolasa Ducking)  . COLONOSCOPY  06/2013   mod diverticulosis, rpt 10 yrs Henrene Pastor)  . LAPAROSCOPY  1994   endometriosis  . TONSILLECTOMY  1994  . TONSILLECTOMY AND ADENOIDECTOMY  1995  . TOTAL ABDOMINAL HYSTERECTOMY     Dr Rayford Halsted for endometriosis  Current Outpatient Medications  Medication Sig Dispense Refill  . albuterol (PROVENTIL) (2.5 MG/3ML) 0.083% nebulizer solution Take 3 mLs (2.5 mg total) by nebulization every 6 (six) hours as needed for wheezing or shortness of breath. 150 mL 0  . Biotin 5 MG CAPS Take 1 capsule (5 mg total) by mouth daily.  0  . chlorpheniramine (CHLOR-TRIMETON) 4 MG tablet Take by mouth.    . cholecalciferol (VITAMIN D3) 25 MCG (1000 UNIT) tablet Take 1,000 Units by mouth daily.    Marland Kitchen Dextromethorphan Polistirex (DELSYM PO) Take by mouth daily.    . famotidine (PEPCID) 20 MG tablet One after supper 30 tablet 11  . gabapentin (NEURONTIN) 300 MG capsule Take 1 capsule (300 mg total) by mouth 4 (four) times daily. 120 capsule 2  . montelukast  (SINGULAIR) 10 MG tablet One at bedtime every night 30 tablet 2  . pantoprazole (PROTONIX) 40 MG tablet TAKE 1 TABLET (40 MG TOTAL) BY MOUTH DAILY. TAKE 30-60 MIN BEFORE FIRST MEAL OF THE DAY 30 tablet 2  . traMADol (ULTRAM) 50 MG tablet TAKE 1 TABLET BY MOUTH EVERY 4 (FOUR) HOURS AS NEEDED FOR UP TO 5 DAYS (FOR COUGH OR PAIN).    Marland Kitchen amitriptyline (ELAVIL) 50 MG tablet Take 50 mg by mouth at bedtime.    Marland Kitchen diltiazem (CARDIZEM) 30 MG tablet Take 0.5 tablets (15 mg total) by mouth every 6 (six) hours as needed. 45 tablet 3   No current facility-administered medications for this visit.    Allergies:   Fish allergy, Shellfish allergy, Chocolate, Cocoa, Penicillins, and Tape    ROS:  Please see the history of present illness.   Otherwise, review of systems are positive for none.   All other systems are reviewed and negative.    PHYSICAL EXAM: VS:  Temp (!) 97.4 F (36.3 C)   Ht 5\' 5"  (1.651 m)   Wt 202 lb 3.2 oz (91.7 kg)   LMP 01/27/2000   SpO2 97%   BMI 33.65 kg/m  , BMI Body mass index is 33.65 kg/m. GENERAL:  Well appearing NECK:  No jugular venous distention, waveform within normal limits, carotid upstroke brisk and symmetric, no bruits, no thyromegaly LUNGS:  Clear to auscultation bilaterally CHEST:  Unremarkable HEART:  PMI not displaced or sustained,S1 and S2 within normal limits, no S3, no S4, no clicks, no rubs, NO murmurs ABD:  Flat, positive bowel sounds normal in frequency in pitch, no bruits, no rebound, no guarding, no midline pulsatile mass, no hepatomegaly, no splenomegaly EXT:  2 plus pulses throughout, no edema, no cyanosis no clubbing   EKG:  EKG is not ordered today.    Recent Labs: 10/24/2018: Brain Natriuretic Peptide 32 01/15/2019: ALT 21; BUN 12; Creatinine, Ser 1.02; Potassium 4.5; Sodium 140; TSH 1.62 03/17/2019: Hemoglobin 13.9; Platelets 231.0    Lipid Panel    Component Value Date/Time   CHOL 174 12/13/2017 0829   CHOL 158 02/16/2016 0826   TRIG  108.0 12/13/2017 0829   HDL 59.10 12/13/2017 0829   HDL 50 02/16/2016 0826   CHOLHDL 3 12/13/2017 0829   VLDL 21.6 12/13/2017 0829   LDLCALC 94 12/13/2017 0829   LDLCALC 82 02/16/2016 0826      Wt Readings from Last 3 Encounters:  04/10/19 202 lb 3.2 oz (91.7 kg)  03/17/19 207 lb (93.9 kg)  03/05/19 207 lb 3.2 oz (94 kg)      Other studies Reviewed: Additional studies/ records that were reviewed today include: None. Review of  the above records demonstrates:  Please see elsewhere in the note.     ASSESSMENT AND PLAN:  PRESYNCOPE:   We are going to continue with conservative measures there.  We talked about potential therapies but I would prefer to stay away from something that might raise her resting blood pressure.  She will continue to avoid sudden movements and wear compression stockings.  PALPITATIONS: She has sometimes sinus tachycardia.  I will give her a very low-dose of Cardizem to take as a pill in pocket approach to manage this.  COVID: She has positive antibodies still.  She will avoid the vaccine and get retested on this and consider taking it if her antibodies wane.  Current medicines are reviewed at length with the patient today.  The patient does not have concerns regarding medicines.  The following changes have been made:  As above  Labs/ tests ordered today include: Na No orders of the defined types were placed in this encounter.    Disposition:   FU with me in two months.    Signed, Minus Breeding, MD  04/10/2019 10:47 AM    [facility]

## 2019-04-10 ENCOUNTER — Ambulatory Visit: Payer: BC Managed Care – PPO | Admitting: Cardiology

## 2019-04-10 ENCOUNTER — Other Ambulatory Visit: Payer: Self-pay

## 2019-04-10 ENCOUNTER — Encounter: Payer: Self-pay | Admitting: Cardiology

## 2019-04-10 VITALS — Temp 97.4°F | Ht 65.0 in | Wt 202.2 lb

## 2019-04-10 DIAGNOSIS — R42 Dizziness and giddiness: Secondary | ICD-10-CM

## 2019-04-10 MED ORDER — DILTIAZEM HCL 30 MG PO TABS: 15.0000 mg | ORAL_TABLET | Freq: Four times a day (QID) | ORAL | 3 refills | Status: DC | PRN

## 2019-04-10 NOTE — Patient Instructions (Addendum)
Medication Instructions:  Take 1/2 tablet of Cardizem every 6 hours as needed *If you need a refill on your cardiac medications before your next appointment, please call your pharmacy*  Lab Work: None ordered this visit  Testing/Procedures: None ordered this visit  Follow-Up: At [facility], you and your health needs are our priority.  As part of our continuing mission to provide you with exceptional heart care, we have created designated Provider Care Teams.  These Care Teams include your primary Cardiologist (physician) and Advanced Practice Providers (APPs -  Physician Assistants and Nurse Practitioners) who all work together to provide you with the care you need, when you need it.   Your next appointment:   2 month(s)  The format for your next appointment:   In Person  Provider:   Minus Breeding, MD   Other Instructions Wear compression stockings

## 2019-04-14 ENCOUNTER — Encounter: Payer: Self-pay | Admitting: Internal Medicine

## 2019-04-14 ENCOUNTER — Other Ambulatory Visit: Payer: Self-pay

## 2019-04-14 ENCOUNTER — Ambulatory Visit: Payer: BC Managed Care – PPO | Admitting: Internal Medicine

## 2019-04-14 DIAGNOSIS — R0609 Other forms of dyspnea: Secondary | ICD-10-CM

## 2019-04-14 DIAGNOSIS — R0600 Dyspnea, unspecified: Secondary | ICD-10-CM

## 2019-04-14 DIAGNOSIS — R05 Cough: Secondary | ICD-10-CM | POA: Diagnosis not present

## 2019-04-14 DIAGNOSIS — R058 Other specified cough: Secondary | ICD-10-CM

## 2019-04-14 DIAGNOSIS — J45909 Unspecified asthma, uncomplicated: Secondary | ICD-10-CM | POA: Diagnosis not present

## 2019-04-14 MED ORDER — PREDNISONE 10 MG PO TABS: ORAL_TABLET | ORAL | 0 refills | Status: DC

## 2019-04-14 MED ORDER — FLUTTER DEVI: 0 refills | Status: AC

## 2019-04-14 NOTE — Patient Instructions (Addendum)
Strongly 2 bed blocks under head board  Every time you fee the urge to cough do so into the flutter valve.  Let Dr Joya Gaskins refer you to a pulmonary doctor at [place] who he prefers to use.   Prednisone 10 mg x 2 until better then 1 daily x one week and stop   Stop singulair

## 2019-04-14 NOTE — Progress Notes (Signed)
Cassie [name], female    DOB: 11/02/1970,    MRN: [id number]   Brief patient profile:  6   yowf grew up in [place]/ [place] came [place] to [place] and stayed, stopped smoking completely 2009 but really ever much of smoker and some springtime rhinitis worse since in  [place] and [place] winter time "bronchial asthma" on short term saba then dx covid 19 Aug 30 2018 req ER eval  09/15/2018 >  Prednisone / albuterol  > transiently better but still no voice and cough >repeat x 3 total pred each time about 50% then relapase w/in a week and last dose 11/12/2018 and worse since so referred to pulmonary clinic 11/21/2018 by Dr   Danise Mina.     History of Present Illness  11/21/2018  Pulmonary/ 1st office eval/Cassie [name]  Chief Complaint  Patient presents with  . Consult    Patient was diagnosed with Covid in Aug and hasn't had a voice since Aug and has had shortness of breath. Patient feels like she is getting worse since its getting colder out side. Patient states she still has a cough but is not productive. Patient states she still feels tired all the time but worse with exertion. Patient also states she has bruising that has poped up on her skin without injury.  Dyspnea:  100 ft or so Cough: daytime with walking or activity,  W/in hour of hs  Despite 2 pillows, no sputum Taking advair x 2weeks/ no better  saba 2 bid and neb  Tessalon helps some but only using at hs rec First take delsym two tsp every 12 hours and supplement if needed with  tramadol 50 mg up to 2 every 4 hours to suppress the urge to cough at all or even clear your throat.   Once you have eliminated the cough for 3 straight days try reducing the tramadol first,  then the delsym as tolerated.   Prednisone 10 mg take  4 each am x 2 days,   2 each am x 2 days,  1 each am x 2 days and stop (this is to eliminate allergies and inflammation from coughing) Protonix (pantoprazole) Take 30-60 min before first meal of the day and Pepcid 20 mg one hour  before bedtime until return  Stop advair and albuterol inhaler Only use your albuterol nebulizer as a rescue medication to be used if you can't catch your breath by resting   - The less you use it, the better it will work when you need it. - Ok to use up to one time  every 4 hours if you must but call for immediate appointment if use goes up over your usual need  GERD  Diet    12/01/2018 NP no better Delsym 2 tsp Twice daily  For cough  Tessalon Three times a day  For cough  Tramadol 50mg  1-2 every 4 hr as needed for severe cough , may make you sleep  Begin Chlorpheniramine 4mg  2 At bedtime  .  May use extra Chlorpheniramine 4mg  every 4hr as needed for drainage , throat clearing, use with caution as may make you sleepy.  Begin Allegra 180mg  daily in am .  Stop Tussionex.  Voice rest , sips of water to soothe throat  Avoid throat clearing. No singing .  Continue on Protonix and Pepcid.  Albuterol Neb every 4hr as needed for wheezing /shortness of breath .   12/08/2018 NP : no better Chest xray today .  Symbicort 2  puffs Twice daily  With spacer , rinse after use.  Delsym 2 tsp Twice daily  For cough  Tessalon Three times a day  For cough  Tramadol 50mg  1-2 every 4 hr as needed for severe cough , may make you sleep  Chlorpheniramine 4mg  2 At bedtime  .  May use extra Chlorpheniramine 4mg  every 4hr as needed for drainage , throat clearing, use with caution as may make you sleepy.  Allegra 180mg  daily in am .  Voice rest , sips of water to soothe throat  Avoid throat clearing. No singing .  Continue on Protonix and Pepcid.  Albuterol Neb every 4hr as needed for wheezing /shortness of breath .  Follow up with ENT as planned     12/22/2018 Prednisone taper over next week.  Go for Speech evaluation  Symbicort 2 puffs Twice daily  With spacer , rinse after use.  Delsym 2 tsp Twice daily  For cough  Tessalon Three times a day  For cough  Tramadol 50mg  1-2 every 4 hr as needed for  severe cough , may make you sleep  Chlorpheniramine 4mg  2 At bedtime  .  May use extra Chlorpheniramine 4mg  every 4hr as needed for drainage , throat clearing, use with caution as may make you sleepy.  Allegra 180mg  daily in am .  Voice rest , sips of water to soothe throat  Avoid throat clearing. No singing .  Continue on Protonix and Pepcid.  Albuterol Neb every 4hr as needed for wheezing /shortness of breath.  Follow up in  6  weeks with Dr. Melvyn Novas  Or [place] NP and As needed   Please contact office for sooner follow up if symptoms do not improve or worsen or seek emergency care    02/18/2019  f/u ov/Cassie [name] re: UACS post covid 19  Late Aug 2020  Chief Complaint  Patient presents with  . Follow-up    Cough is some better but has not resolved. When she is able to rest her voice and not exert herself she coughs less.    Dyspnea: mostly related to cough - hard to sort out  Cough: incessant harsh dry day > Noct and worse with voice use Sleeping: some better p h1 hs  rec Gabapentin 100 mg four times daily  Continue pantoprazole 30 min before breakfast and famotidine after supper  Take delsym two tsp every 12 hours and supplement if needed with  Tylenol #4 every 4 hours to suppress the urge to cough.   Once you have eliminated the cough for 3 straight days try reducing the tylenol #4  first,  then the delsym as tolerated.   Prednisone Take 4 for two days three for two days two for two days one for two days  Stop symbicort/ tessalon [name]  For drainage / throat tickle try take CHLORPHENIRAMINE  4 mg  (Chlortab 4mg   at [employer] should be easiest to find in the green box)  take one every 4 hours as needed   Please schedule a follow up office visit in 2 weeks, call sooner if needed with all medications /inhalers/ solutions in hand     03/05/2019  f/u ov/Cassie [name] re: cough post covid aug 2020 with nl ct chest / brought all meds  Chief Complaint  Patient presents with  . Follow-up     Cough is sme better but has not completely resolved.   Dyspnea:  While exerting 02 is fine/ but ex tol better but still coughing Cough:  dry hack better p tyl #4 just bid never more than 4 daily x maybe 2 days / thinks pred helps the most  Sleeping:  Taking Tyl # 4 hs  And sleeping until around 3 am p 1st gen H1 blockers per guidelines  But never takes more than one at hs / two pillows  SABA use: none/ not sure even albuterol neb helped  02: no rec Gabapentin 100 mg  X 2  Four times a day then up to 300 mg four times a day when you finish the 200s  Prednisone take 4 for three days 3 for three days 2 for three days 1 for three days and stop  For drainage / throat tickle try take CHLORPHENIRAMINE  4 mg  (Chlortab 4mg   at [employer] should be easiest to find in the green box)  take one every 4 hours as needed - available over the counter- may cause drowsiness so start with just a bedtime dose or two and see how you tolerate it before trying in daytime   Keep the candy handy and rest voice  Ok to use albuterol 2.5 mg every to every 4 hours if helps  No work x 2 weeks   04/14/2019  f/u ov/Cassie [name] re: cough since covid aug 2020 /never able to eliminate but never took more than 4 tramadol in 24 hours "knocks me out"   Chief Complaint  Patient presents with  . Follow-up    Cough is unchanged since the last visit. She was seen by ENT- Dr Joya Gaskins and he started elavil 50 mg at bedtime.   Dyspnea:  Mostly limited by  the cough  Cough: dry / never completely  gone even on prednisone though it did the best Sleeping:  SABA use:  Only used albuterol neb twice since last ov "helps some"  02: none  rec Continue gabapenitin  300 mg four times a day  Take delsym two tsp every 12 hours and supplement if needed with  tramadol 50 mg up to 2 every 4 hours to 100% eliminate the urge to cough. Swallowing water and/or using ice chips/non mint and menthol containing candies (such as lifesavers or sugarless jolly  ranchers) are also effective.  You should rest your voice and avoid activities that you know make you cough. Once you have eliminated the cough for 3 straight days try reducing the tramadol first,  then the delsym as tolerated.  Take Prednisone 10 mg  4 for three days 3 for three days 2 for three days 1 for three days and stop  Singulair 10 mg one daily  Add albuterol up to 2.5 mg every 4 hours if needed if you think it helps any of your symptoms  Please remember to go to the lab department   for your tests - we will call you with the results when they are available. Call next week if not 100% able to eliminate the cough for referral to Dr Lamar Benes  If better, see you here in 4 weeks   04/14/2019  f/u ov/Cassie [name] re: cough since covid aug 2020 /never able to eliminate but never took more than 4 tramadol in 24 hours "knocks me out"   Chief Complaint  Patient presents with  . Follow-up    Cough is unchanged since the last visit. She was seen by ENT- Dr Joya Gaskins and he started elavil 50 mg at bedtime.   Dyspnea: even if not coughing says doe x more than slow adls Cough: dry  honk despite chlortrimeton x 2, tramadol x one, elavil 90 m and delsym and famotidine hs she sleeps but husband says still coughing  Sleeping: 2 pillows  SABA use: none in the last 4 days 02: none   No obvious day to day or daytime variability or assoc excess/ purulent sputum or mucus plugs or hemoptysis or cp or chest tightness, subjective wheeze or overt sinus or hb symptoms.    . Also denies any obvious fluctuation of symptoms with weather or environmental changes or other aggravating or alleviating factors except as outlined above   No unusual exposure hx or h/o childhood pna/ asthma or knowledge of premature birth.  Current Allergies, Complete Past Medical History, Past Surgical History, Family History, and Social History were reviewed in [employer] record.  ROS  The following are not active  complaints unless bolded Hoarseness, sore throat, dysphagia, dental problems, itching, sneezing,  nasal congestion or discharge of excess mucus or purulent secretions, ear ache,   fever, chills, sweats, unintended wt loss or wt gain, classically pleuritic or exertional cp,  orthopnea pnd or arm/hand swelling  or leg swelling, presyncope, palpitations, abdominal pain, anorexia, nausea, vomiting, diarrhea  or change in bowel habits or change in bladder habits, change in stools or change in urine, dysuria, hematuria,  rash, arthralgias, visual complaints, headache, numbness, weakness or ataxia or problems with walking or coordination,  change in mood or  memory.        Current Meds  Medication Sig  . albuterol (PROVENTIL) (2.5 MG/3ML) 0.083% nebulizer solution Take 3 mLs (2.5 mg total) by nebulization every 6 (six) hours as needed for wheezing or shortness of breath.  Marland Kitchen amitriptyline (ELAVIL) 50 MG tablet Take 50 mg by mouth at bedtime.  . Biotin 5 MG CAPS Take 1 capsule (5 mg total) by mouth daily.  . chlorpheniramine (CHLOR-TRIMETON) 4 MG tablet Take by mouth.  . cholecalciferol (VITAMIN D3) 25 MCG (1000 UNIT) tablet Take 1,000 Units by mouth daily.  Marland Kitchen Dextromethorphan Polistirex (DELSYM PO) Take by mouth daily.  Marland Kitchen diltiazem (CARDIZEM) 30 MG tablet Take 0.5 tablets (15 mg total) by mouth every 6 (six) hours as needed.  . famotidine (PEPCID) 20 MG tablet One after supper  . gabapentin (NEURONTIN) 300 MG capsule Take 1 capsule (300 mg total) by mouth 4 (four) times daily.  . montelukast (SINGULAIR) 10 MG tablet One at bedtime every night  . pantoprazole (PROTONIX) 40 MG tablet TAKE 1 TABLET (40 MG TOTAL) BY MOUTH DAILY. TAKE 30-60 MIN BEFORE FIRST MEAL OF THE DAY  . traMADol (ULTRAM) 50 MG tablet TAKE 1 TABLET BY MOUTH EVERY 4 (FOUR) HOURS AS NEEDED FOR UP TO 5 DAYS (FOR COUGH OR PAIN).                       Past Medical History:  Diagnosis Date  . Allergy   . Asthma   . Cardiac  disease   . Diffuse cystic mastopathy   . GERD (gastroesophageal reflux disease)   . Guttate psoriasis   . H/O syncope    cards work-up WNL (Hochrein) thought 2/2 overmedication vs arrhythmias  . Hx of endometriosis    hysterectomy  . Hx of migraines    occasional; improved since hysterectomy  . Labral tear of shoulder 02/2014   R, pending surgery Tamera Punt)  . Rosacea    mild       Objective:     amb wf no longer honking but  tearful, hopeless affect    04/14/2019       207  03/05/2019         207  02/18/2019       204   11/21/18 198 lb 12.8 oz (90.2 kg)  10/24/18 196 lb 5 oz (89 kg)  10/03/18 190 lb (86.2 kg)    Vital signs reviewed  04/14/2019  - Note at rest 02 sats  97% on RA     HEENT : pt wearing mask not removed for exam due to covid -19 concerns.    NECK :  without JVD/Nodes/TM/ nl carotid upstrokes bilaterally   LUNGS: no acc muscle use,  Nl contour chest which is clear to A and P bilaterally without cough on insp or exp maneuvers   CV:  RRR  no s3 or murmur or increase in P2, and no edema   ABD:  soft and nontender with nl inspiratory excursion in the supine position. No bruits or organomegaly appreciated, bowel sounds nl  MS:  Nl gait/ ext warm without deformities, calf tenderness, cyanosis or clubbing No obvious joint restrictions   SKIN: warm and dry without lesions    NEURO:  alert, approp, nl sensorium with  no motor or cerebellar deficits apparent.                 Assessment

## 2019-04-16 ENCOUNTER — Encounter: Payer: Self-pay | Admitting: Internal Medicine

## 2019-04-16 ENCOUNTER — Telehealth: Payer: Self-pay

## 2019-04-16 DIAGNOSIS — R058 Other specified cough: Secondary | ICD-10-CM

## 2019-04-16 DIAGNOSIS — R0609 Other forms of dyspnea: Secondary | ICD-10-CM | POA: Insufficient documentation

## 2019-04-16 DIAGNOSIS — U071 COVID-19: Secondary | ICD-10-CM

## 2019-04-16 DIAGNOSIS — R05 Cough: Secondary | ICD-10-CM

## 2019-04-16 NOTE — Telephone Encounter (Signed)
I spoke with pt; Dr Melvyn Novas office wants pt to see another pulmonologist at [facility]; pt cannot get referral at [facility] until Sept. Waiting list at [facility] is 2 1/2 pgs now. Pt is willing to go to [facility] or [facility] whoever can see her the soonest; pt said Dr Melvyn Novas wanted pt to be seen at [facility] because of Dr Joya Gaskins ENT being at [facility]. Pt said Dr Melvyn Novas thinks needs cough specialist but pt said she thinks she needs more than that due to SOB. Pt sounds very short of breath and congested. Pt has no fever but dry cough and SOB upon exertion or coughing episode. Pt said this has been going on since covid in Aug 2020. Dr Darnell Level said would do referral and pt will wait for cb from [facility]; ED precautions given and pt voiced understanding.

## 2019-04-16 NOTE — Assessment & Plan Note (Signed)
Onset aug 2020 with covid 19 > hrct chest neg ILD 12/10/2018 - 04/14/2019   Walked RA x one lap =  approx 250 ft -@ moderately  fast pace stopped due to cough, not sob, with sats 95%   The honking cough is typical of uacs, not asthma or copd, and clearly is the cause of her ex intol so rec continue to focus on that pending second opinion from [facility] pulmonary.          Each maintenance medication was reviewed in detail including emphasizing most importantly the difference between maintenance and prns and under what circumstances the prns are to be triggered using an action plan format where appropriate.  Total time for H and P, chart review, counseling, teaching device (flutter)  directly observing portions of ambulatory 02 saturation study/  and generating customized AVS unique to this summary office visit / charting = 30 min

## 2019-04-16 NOTE — Telephone Encounter (Signed)
New referral placed.

## 2019-04-16 NOTE — Telephone Encounter (Signed)
Pulmonary appointment set up with  Dr York Spaniel [facility] Pulmonary for  05/01/19 , patient notified of appointment and details

## 2019-04-16 NOTE — Assessment & Plan Note (Addendum)
Onset in late  Aug 2020 with Covid 19 pos 08/30/18 not admitted  - cyclical cough rx [id number] > partial improvement  -HRCT chest 12/10/18 >No evidence of fibrotic interstitial lung disease. 2. Mild, lobular air trapping on expiratory phase imaging,suggestive of small airways disease. - repeat cyclical cough rx with Tyl #4 and add gabapentin 100 qid > some better 03/05/2019 so rec titrate up to 300 mg qid / pred x 12 days  - Allergy profile 03/17/2019 >  Eos 0.2 /  IgE14  >>>    added singulair empirically  - Referred to Dr Eliott Nine with moderate erythema noted > rec Elavil/ speech therapy 04/06/19  - Added flutter valve 04/14/2019  and stopped singulair   Has not done bed blocks yet and says husband notes coughing when sleeping and can't comply with more than 4 tramadol in 24 though limit was set at 12 x 3 days only, which she's never been able to accomplish to eliminate cycle of coughing but does say pred works the best so rec:  pred 20 mg daily until better then 1 daily x one week Refer to [facility] Pulmonary at Dr Bettina Gavia discretion since he knows which docs there focus on cough.

## 2019-04-16 NOTE — Telephone Encounter (Signed)
[place] Night - Client Nonclinical Telephone Record [facility] Client [facility] Night - Client Client Site [place] Physician Ria Bush - MD Contact Type Call Who Is Calling Patient / Member / Family / Caregiver Caller Name suesan folz Caller Phone Number [phone] Patient Name Cassie [name] Patient DOB 1970-08-27 Call Type Message Only Information Provided Reason for Call Request for General Office Information Initial Comment Caller states she is is having some trouble breathing she can barley talk, pulmonologist wants her to see someone else because [facility] can't see her till sept Additional Comment Disp. Time Disposition Final User 04/15/2019 4:59:44 PM General Information Provided Yes Ilona Sorrel Call Closed By: Ilona Sorrel Transaction Date/Time: 04/15/2019 4:57:01 PM (ET)

## 2019-05-01 DIAGNOSIS — Z8616 Personal history of COVID-19: Secondary | ICD-10-CM | POA: Diagnosis not present

## 2019-05-01 DIAGNOSIS — J45909 Unspecified asthma, uncomplicated: Secondary | ICD-10-CM | POA: Diagnosis not present

## 2019-05-01 DIAGNOSIS — Z6835 Body mass index (BMI) 35.0-35.9, adult: Secondary | ICD-10-CM | POA: Diagnosis not present

## 2019-05-05 ENCOUNTER — Telehealth: Payer: Self-pay

## 2019-05-05 NOTE — Telephone Encounter (Signed)
Spoke with Cassie [name].   She has seen Pulm at [facility] Dr Jamse Belfast for post covid cough and hoarseness - currently on prednisone 20mg  daily. Ongoing hoarseness - continues speech therapy. Referred to [facility] covid long hauler PM&R clinic scheduled 06/2019.   She has been caring for ill father with ESBL sepsis at [place]. She was exposed to his blood and urine. Discussed transmission of these bacteria is thought to be low however she is on immunosuppressive prednisone dose which increases her risk - rec watch for new symptom development, let us know right away if any fever, UTI symptoms, worsening productive cough, etc.

## 2019-05-05 NOTE — Telephone Encounter (Signed)
[place] Night - Client TELEPHONE ADVICE RECORD Access[facility]urse Patient Name: Cassie [name] Gender: Unknown DOB: [birth year] Age: 49 Y 56 M 15 D Return Phone Number: [phone] (Primary) Address: City/State/Zip: [place] [address] Client [facility] Night - Client Client Site [place] Physician Ria Bush - MD Contact Type Call Who Is Calling Patient / Member / Family / Caregiver Call Type Triage / Clinical Relationship To Patient Self Return Phone Number [phone] (Primary) Chief Complaint BREATHING - shortness of breath or sounds breathless Reason for Call Symptomatic / Request for [place] states she is calling because she is having issues related to covid. She states her dad was diagnosed with MDRO ESBL and she has been exposed. She states she has shortness of breath. Translation No Nurse Assessment Nurse: Self, RN, Nira Conn Date/Time (Eastern Time): 05/04/2019 5:22:50 PM Confirm and document reason for call. If symptomatic, describe symptoms. ---Caller says she has lung issues and her father was just DX with some type of MDRO in his blood and he bled on her. Caller says she is immunocompromised and is just concerned , caller is currently Asymptomatic. No fever, No worsening SOB . Has the patient had close contact with a person known or suspected to have the novel coronavirus illness OR traveled / lives in area with major community spread (including international travel) in the last 14 days from the onset of symptoms? * If Asymptomatic, screen for exposure and travel within the last 14 days. ---No Does the patient have any new or worsening symptoms? ---No Guidelines Guideline Title Affirmed Question Affirmed Notes Nurse Date/Time (Eastern Time) Disp. Time Eilene Ghazi Time) Disposition Final User 05/04/2019 5:31:09 PM Clinical Call Yes Self, RN,  Nira Conn Comments User: Robynn Pane, RN Date/Time Eilene Ghazi Time): 05/04/2019 5:31:00 PM Caller instructed since no symptoms to call office in [place] when they open to speak with PCP . PLEASE NOTE: All timestamps contained within this report are represented as [place] Standard Time. CONFIDENTIALTY NOTICE: This fax transmission is intended only for the addressee. It contains information that is legally privileged, confidential or otherwise protected from use or disclosure. If you are not the intended recipient, you are strictly prohibited from reviewing, disclosing, copying using or disseminating any of this information or taking any action in reliance on or regarding this information. If you have received this fax in error, please notify us immediately by telephone so that we can arrange for its return to [place]. Phone: [phone], Toll-Free: [phone], Fax: [phone] Page: 2 of 2 Call Id:

## 2019-05-05 NOTE — Telephone Encounter (Signed)
I spoke with pt; pt was exposed to MDRO ESBL and is in hospital. pts dad pulled out his IV and pt got blood on her and pt urinated on pt from 05/02/19 to 05/04/19. Pt wants to know what she should do since exposed to MDRO ESBL.  Pt continues with extreme hoarsness and SOB; pt is immune compromised and pts fathers doctor advised pt to contact Dr Darnell Level to see how to proceed. No fever. Pt said she is not sure a virtual visit would help and request cb about possible lab testing or abx. Pt request cb. UC & ED precautions given and pt voiced understanding.sending note to Dr Baldwin Crown CMA.

## 2019-05-10 ENCOUNTER — Other Ambulatory Visit: Payer: Self-pay | Admitting: Internal Medicine

## 2019-05-10 DIAGNOSIS — R058 Other specified cough: Secondary | ICD-10-CM

## 2019-05-10 DIAGNOSIS — R05 Cough: Secondary | ICD-10-CM

## 2019-05-11 DIAGNOSIS — R05 Cough: Secondary | ICD-10-CM | POA: Diagnosis not present

## 2019-05-11 DIAGNOSIS — F444 Conversion disorder with motor symptom or deficit: Secondary | ICD-10-CM | POA: Diagnosis not present

## 2019-05-13 DIAGNOSIS — R05 Cough: Secondary | ICD-10-CM | POA: Diagnosis not present

## 2019-05-13 DIAGNOSIS — J387 Other diseases of larynx: Secondary | ICD-10-CM | POA: Diagnosis not present

## 2019-05-13 DIAGNOSIS — R490 Dysphonia: Secondary | ICD-10-CM | POA: Diagnosis not present

## 2019-05-25 DIAGNOSIS — R490 Dysphonia: Secondary | ICD-10-CM | POA: Diagnosis not present

## 2019-05-25 DIAGNOSIS — R05 Cough: Secondary | ICD-10-CM | POA: Diagnosis not present

## 2019-05-25 DIAGNOSIS — J387 Other diseases of larynx: Secondary | ICD-10-CM | POA: Diagnosis not present

## 2019-06-11 DIAGNOSIS — R0600 Dyspnea, unspecified: Secondary | ICD-10-CM | POA: Diagnosis not present

## 2019-06-11 DIAGNOSIS — U071 COVID-19: Secondary | ICD-10-CM | POA: Diagnosis not present

## 2019-06-17 NOTE — Progress Notes (Signed)
Cardiology Office Note   Date:  06/18/2019   ID:  Asencion, Guisinger [birth year], MRN [id number]  PCP:  Ria Bush, MD  Cardiologist:   Minus Breeding, MD   Chief Complaint  Patient presents with  . Cough      History of Present Illness: Cassie [name] is a 49 y.o. female who presents for evaluation of dizziness.  She had chest pain in the past.  She had normal coronary arteries on cardiac catheterization in 2006.  Myoview obtained on 02/10/2016 was low risk with EF of 78%, no reversible ischemia or infarction.  Echocardiogram obtained on the same day showed EF 60 to 65%, no significant valve disorder.  Patient was referred to cardiology service for evaluation of palpitation and dizziness.  She was diagnosed with COVID-19 in August 2020.  Since her diagnosis, she has had persistent dizziness upon standing.  She also had racing heartbeat that fluctuate to low heart rate.   Repeat echocardiogram obtained on 02/11/2019 showed EF 60 to 65%, normal pulmonary artery pressure, no significant valve disorder.  3 days ZIO monitor did not show significant arrhythmia. She wore a 14 day monitor and had rare supraventricular beats.  Unfortunately she is having "long haul" symptoms from her Covid infection.    I treated her tachycardia with prn Cardizem.  Since I last saw her she went to [facility].  She was seen in her Covid clinic and I did review these records.  She was given a prescription for Xopenex which she has not had filled yet.  Her heart rate was between 101 120.  Her blood pressure was markedly elevated at 101/86.  There was question of using oxygen but her oxygen saturation did not fall below 90%.  She says that it will be low when she is wheezing.  She does not wheeze all the time.  She does have a continued dry nonproductive cough.  She does feel her heart going fast but she is only use as needed Cardizem occasionally.  She is not describing chest pressure, neck or arm discomfort.  She feels  frustrated because she has had to go to multiple sites and nobody can tell her why she feels so poorly.  She is not describing PND or orthopnea.  She is not describing chest pressure, neck or arm discomfort.    Past Medical History:  Diagnosis Date  . Allergy   . Asthma   . Cardiac disease   . Diffuse cystic mastopathy   . GERD (gastroesophageal reflux disease)   . Guttate psoriasis   . H/O syncope    cards work-up WNL (Roniesha Hollingshead) thought 2/2 overmedication vs arrhythmias  . Hx of endometriosis    hysterectomy  . Hx of migraines    occasional; improved since hysterectomy  . Labral tear of shoulder 02/2014   R, pending surgery Tamera Punt)  . Rosacea    mild    Past Surgical History:  Procedure Laterality Date  . BREAST BIOPSY  2010   left; core biopsy  . BREAST EXCISIONAL BIOPSY Bilateral    multiple bilateral  . BREAST MASS EXCISION  2008  . BREAST SURGERY Right    excision of lesion  . CARDIAC CATHETERIZATION  2007  . CHOLECYSTECTOMY  2008  . COLONOSCOPY  2006   WNL Nicolasa Ducking)  . COLONOSCOPY  06/2013   mod diverticulosis, rpt 10 yrs Henrene Pastor)  . LAPAROSCOPY  1994   endometriosis  . TONSILLECTOMY  1994  . TONSILLECTOMY AND ADENOIDECTOMY  1995  .  TOTAL ABDOMINAL HYSTERECTOMY     Dr Rayford Halsted for endometriosis     Current Outpatient Medications  Medication Sig Dispense Refill  . albuterol (PROVENTIL) (2.5 MG/3ML) 0.083% nebulizer solution Take 3 mLs (2.5 mg total) by nebulization every 6 (six) hours as needed for wheezing or shortness of breath. 150 mL 0  . amitriptyline (ELAVIL) 50 MG tablet Take 50 mg by mouth at bedtime.    . Biotin 5 MG CAPS Take 1 capsule (5 mg total) by mouth daily.  0  . chlorpheniramine (CHLOR-TRIMETON) 4 MG tablet Take by mouth.    . cholecalciferol (VITAMIN D3) 25 MCG (1000 UNIT) tablet Take 1,000 Units by mouth daily.    Marland Kitchen Dextromethorphan Polistirex (DELSYM PO) Take by mouth daily.    . famotidine (PEPCID) 20 MG tablet One after supper 30 tablet  11  . Fluticasone-Salmeterol (ADVAIR DISKUS) 500-50 MCG/DOSE AEPB     . gabapentin (NEURONTIN) 300 MG capsule TAKE 1 CAPSULE (300 MG TOTAL) BY MOUTH 4 (FOUR) TIMES DAILY.    Marland Kitchen ipratropium (ATROVENT) 0.02 % nebulizer solution Inhale into the lungs.    . pantoprazole (PROTONIX) 40 MG tablet TAKE 1 TABLET (40 MG TOTAL) BY MOUTH DAILY. TAKE 30-60 MIN BEFORE FIRST MEAL OF THE DAY 30 tablet 2  . Respiratory Therapy Supplies (FLUTTER) DEVI Use as directed 1 each 0  . diltiazem (CARDIZEM CD) 120 MG 24 hr capsule Take 1 capsule (120 mg total) by mouth daily. 90 capsule 3   No current facility-administered medications for this visit.    Allergies:   Fish allergy, Shellfish allergy, Chocolate, Cocoa, Penicillins, and Tape    ROS:  Please see the history of present illness.   Otherwise, review of systems are positive for none.   All other systems are reviewed and negative.    PHYSICAL EXAM: VS:  BP 130/78   Pulse 85   Temp (!) 97.3 F (36.3 C)   Ht _0  (1.651 m)   Wt 202 lb (91.6 kg)   LMP 01/27/2000   SpO2 98%   BMI 33.61 kg/m  , BMI Body mass index is 33.61 kg/m. GENERAL:  Well appearing.  She does have continued chronic cough during this visit. NECK:  No jugular venous distention, waveform within normal limits, carotid upstroke brisk and symmetric, no bruits, no thyromegaly LUNGS:  Clear to auscultation bilaterally CHEST: Occasional scattered wheezes HEART:  PMI not displaced or sustained,S1 and S2 within normal limits, no S3, no S4, no clicks, no rubs, no murmurs ABD:  Flat, positive bowel sounds normal in frequency in pitch, no bruits, no rebound, no guarding, no midline pulsatile mass, no hepatomegaly, no splenomegaly EXT:  2 plus pulses throughout, no edema, no cyanosis no clubbing   EKG:  EKG is not  ordered today.    Recent Labs: 10/24/2018: Brain Natriuretic Peptide 32 01/15/2019: ALT 21; BUN 12; Creatinine, Ser 1.02; Potassium 4.5; Sodium 140; TSH 1.62 03/17/2019:  Hemoglobin 13.9; Platelets 231.0    Lipid Panel    Component Value Date/Time   CHOL 174 12/13/2017 0829   CHOL 158 02/16/2016 0826   TRIG 108.0 12/13/2017 0829   HDL 59.10 12/13/2017 0829   HDL 50 02/16/2016 0826   CHOLHDL 3 12/13/2017 0829   VLDL 21.6 12/13/2017 0829   LDLCALC 94 12/13/2017 0829   LDLCALC 82 02/16/2016 0826      Wt Readings from Last 3 Encounters:  06/18/19 202 lb (91.6 kg)  04/14/19 202 lb (91.6 kg)  04/10/19 202 lb 3.2  oz (91.7 kg)      Other studies Reviewed: Additional studies/ records that were reviewed today include: [facility] records. Review of the above records demonstrates:  Please see elsewhere in the note.     ASSESSMENT AND PLAN:   PALPITATIONS: Given the intermittent rapid heart rate I will go ahead and give her Cardizem CD 120.  She had a normal echocardiogram.  However, I will look for any evidence of inflammation and have a low threshold for an MRI although I think likely her tachycardia is driven primarily by pulmonary issues.  I will check a sedimentation rate, CRP, high-sensitivity troponin.  I will also check a BNP level.  We walked her briskly around the office today and she did not drop her oxygen saturation but she says this is not one of the days where she is wheezing excessively.     COVID: She is going to think about getting the vaccine.  She might do antibody testing see when she starts to wane she did have positive antibodies previously.  COUGH: I encouraged her to go ahead and use the Xopenex as was prescribed.  Discussed with our pulmonary team and see if we can get her "[facility]".     Current medicines are reviewed at length with the patient today.  The patient does not have concerns regarding medicines.  The following changes have been made:  As abpve  Labs/ tests ordered today include:   Orders Placed This Encounter  Procedures  . Brain natriuretic peptide  . Sed Rate (ESR)  . C-reactive protein      Disposition:   FU with me in 1 month.    Signed, Minus Breeding, MD  06/18/2019 12:45 PM    Early

## 2019-06-18 ENCOUNTER — Other Ambulatory Visit: Payer: Self-pay

## 2019-06-18 ENCOUNTER — Encounter: Payer: Self-pay | Admitting: Cardiology

## 2019-06-18 ENCOUNTER — Other Ambulatory Visit: Payer: BC Managed Care – PPO | Admitting: *Deleted

## 2019-06-18 ENCOUNTER — Ambulatory Visit: Payer: BC Managed Care – PPO | Admitting: Cardiology

## 2019-06-18 VITALS — BP 130/78 | HR 85 | Temp 97.3°F | Ht 65.0 in | Wt 202.0 lb

## 2019-06-18 DIAGNOSIS — R0602 Shortness of breath: Secondary | ICD-10-CM

## 2019-06-18 DIAGNOSIS — R002 Palpitations: Secondary | ICD-10-CM

## 2019-06-18 LAB — TROPONIN I (HIGH SENSITIVITY): Troponin I (High Sensitivity): 6 ng/L (ref <18)

## 2019-06-18 MED ORDER — DILTIAZEM HCL ER COATED BEADS 120 MG PO CP24: 120.0000 mg | ORAL_CAPSULE | Freq: Every day | ORAL | 3 refills | Status: DC

## 2019-06-18 NOTE — Patient Instructions (Signed)
Medication Instructions:  START CARDIZEM CD 120MG  DAILY *If you need a refill on your cardiac medications before your next appointment, please call your pharmacy*  Lab Work: Your physician recommends that you return for lab work TODAY  Testing/Procedures: NONE ORDERED THIS VISIT  Follow-Up: At [facility], you and your health needs are our priority.  As part of our continuing mission to provide you with exceptional heart care, we have created designated Provider Care Teams.  These Care Teams include your primary Cardiologist (physician) and Advanced Practice Providers (APPs -  Physician Assistants and Nurse Practitioners) who all work together to provide you with the care you need, when you need it.  Your next appointment:   1 month(s)  The format for your next appointment:   In Person  Provider:   Minus Breeding, MD

## 2019-06-18 NOTE — Addendum Note (Signed)
Addended by: Nuala Alpha on: 06/18/2019 01:04 PM   Modules accepted: Orders

## 2019-06-19 LAB — PRO B NATRIURETIC PEPTIDE: NT-Pro BNP: 42 pg/mL (ref 0–249)

## 2019-06-19 LAB — SEDIMENTATION RATE: Sed Rate: 14 mm/hr (ref 0–32)

## 2019-06-19 LAB — C-REACTIVE PROTEIN: CRP: 6 mg/L (ref 0–10)

## 2019-06-23 DIAGNOSIS — J387 Other diseases of larynx: Secondary | ICD-10-CM | POA: Diagnosis not present

## 2019-06-23 DIAGNOSIS — R05 Cough: Secondary | ICD-10-CM | POA: Diagnosis not present

## 2019-06-23 DIAGNOSIS — R490 Dysphonia: Secondary | ICD-10-CM | POA: Diagnosis not present

## 2019-07-02 DIAGNOSIS — Z8616 Personal history of COVID-19: Secondary | ICD-10-CM | POA: Diagnosis not present

## 2019-07-02 DIAGNOSIS — F444 Conversion disorder with motor symptom or deficit: Secondary | ICD-10-CM | POA: Diagnosis not present

## 2019-07-02 DIAGNOSIS — R05 Cough: Secondary | ICD-10-CM | POA: Diagnosis not present

## 2019-07-02 DIAGNOSIS — R490 Dysphonia: Secondary | ICD-10-CM | POA: Diagnosis not present

## 2019-07-07 DIAGNOSIS — R490 Dysphonia: Secondary | ICD-10-CM | POA: Diagnosis not present

## 2019-07-10 DIAGNOSIS — Z8616 Personal history of COVID-19: Secondary | ICD-10-CM | POA: Diagnosis not present

## 2019-07-10 DIAGNOSIS — R7989 Other specified abnormal findings of blood chemistry: Secondary | ICD-10-CM | POA: Diagnosis not present

## 2019-07-10 DIAGNOSIS — R0602 Shortness of breath: Secondary | ICD-10-CM | POA: Diagnosis not present

## 2019-07-13 ENCOUNTER — Encounter: Payer: BC Managed Care – PPO | Admitting: Dermatology

## 2019-07-16 NOTE — Progress Notes (Signed)
Cardiology Office Note   Date:  07/17/2019   ID:  Cassie, [name] Nov 02, 1970, MRN [id number]  PCP:  Ria Bush, MD  Cardiologist:   Minus Breeding, MD   No chief complaint on file.     History of Present Illness: Cassie [name] is a 49 y.o. female who presents for evaluation of dizziness.  She had chest pain in the past.  She had normal coronary arteries on cardiac catheterization in 2006.  Myoview obtained on 02/10/2016 was low risk with EF of 78%, no reversible ischemia or infarction.  Echocardiogram obtained on the same day showed EF 60 to 65%, no significant valve disorder.  The patient was referred to cardiology service for evaluation of palpitation and dizziness.  She was diagnosed with COVID-19 in August 2020.  Since her diagnosis, she has had persistent dizziness upon standing.  She also had racing heartbeat that fluctuate to low heart rate.   Repeat echocardiogram obtained on 02/11/2019 showed EF 60 to 65%, normal pulmonary artery pressure, no significant valve disorder.  3 days ZIO monitor did not show significant arrhythmia. She wore a 14 day monitor and had rare supraventricular beats.  Unfortunately she is having "long haul" symptoms from her Covid infection.   She has been seen in the [facility] clinic.  After the last visit I treated her with Cardizem CD.  Inflammatory markers including hs trop were negative.   She has an appt with our pulmonary.   They did do a CT without any acute findings. She thinks maybe her cough is very slightly better but she still coughing a lot today. She is having episodes feel her heart going fast which did not resolve completely with Cardizem. However, she denies any problems taking this. She denies any chest pressure, neck or arm discomfort. She has a little lightheadedness sometimes upon standing with the medication.  She did have normal C-reactive protein, sed rate, troponin and BNP.   Past Medical History:  Diagnosis Date  .  Allergy   . Asthma   . Cardiac disease   . Diffuse cystic mastopathy   . GERD (gastroesophageal reflux disease)   . Guttate psoriasis   . H/O syncope    cards work-up WNL (Cassie [name]) thought 2/2 overmedication vs arrhythmias  . Hx of endometriosis    hysterectomy  . Hx of migraines    occasional; improved since hysterectomy  . Labral tear of shoulder 02/2014   R, pending surgery Cassie [name])  . Rosacea    mild    Past Surgical History:  Procedure Laterality Date  . BREAST BIOPSY  2010   left; core biopsy  . BREAST EXCISIONAL BIOPSY Bilateral    multiple bilateral  . BREAST MASS EXCISION  2008  . BREAST SURGERY Right    excision of lesion  . CARDIAC CATHETERIZATION  2007  . CHOLECYSTECTOMY  2008  . COLONOSCOPY  2006   WNL Cassie [name])  . COLONOSCOPY  06/2013   mod diverticulosis, rpt 10 yrs Cassie [name])  . LAPAROSCOPY  1994   endometriosis  . TONSILLECTOMY  1994  . TONSILLECTOMY AND ADENOIDECTOMY  1995  . TOTAL ABDOMINAL HYSTERECTOMY     Cassie [name] for endometriosis     Current Outpatient Medications  Medication Sig Dispense Refill  . albuterol (PROVENTIL) (2.5 MG/3ML) 0.083% nebulizer solution Take 3 mLs (2.5 mg total) by nebulization every 6 (six) hours as needed for wheezing or shortness of breath. 150 mL 0  . amitriptyline (ELAVIL) 50 MG tablet Take  50 mg by mouth at bedtime.    . Biotin 5 MG CAPS Take 1 capsule (5 mg total) by mouth daily.  0  . chlorpheniramine (CHLOR-TRIMETON) 4 MG tablet Take by mouth.    . cholecalciferol (VITAMIN D3) 25 MCG (1000 UNIT) tablet Take 1,000 Units by mouth daily.    Marland Kitchen Dextromethorphan Polistirex (DELSYM PO) Take by mouth daily.    Marland Kitchen diltiazem (CARDIZEM CD) 120 MG 24 hr capsule Take 1 capsule (120 mg total) by mouth daily. 90 capsule 3  . famotidine (PEPCID) 20 MG tablet One after supper 30 tablet 11  . Fluticasone-Salmeterol (ADVAIR DISKUS) 500-50 MCG/DOSE AEPB     . gabapentin (NEURONTIN) 300 MG capsule TAKE 1 CAPSULE (300 MG TOTAL) BY  MOUTH 4 (FOUR) TIMES DAILY.    Marland Kitchen ipratropium (ATROVENT) 0.02 % nebulizer solution Inhale into the lungs.    . pantoprazole (PROTONIX) 40 MG tablet TAKE 1 TABLET (40 MG TOTAL) BY MOUTH DAILY. TAKE 30-60 MIN BEFORE FIRST MEAL OF THE DAY 30 tablet 2  . Respiratory Therapy Supplies (FLUTTER) DEVI Use as directed 1 each 0   No current facility-administered medications for this visit.    Allergies:   Fish allergy, Shellfish allergy, Chocolate, Cocoa, Penicillins, and Tape    ROS:  Please see the history of present illness.   Otherwise, review of systems are positive for none.   All other systems are reviewed and negative.    PHYSICAL EXAM: VS:  BP 122/73   Pulse 87   Temp (!) 97 F (36.1 C)   Ht 5\' 4"  (1.626 m)   Wt 209 lb 6.4 oz (95 kg)   LMP 01/27/2000   SpO2 97%   BMI 35.94 kg/m  , BMI Body mass index is 35.94 kg/m. GENERAL:  Well appearing NECK:  No jugular venous distention, waveform within normal limits, carotid upstroke brisk and symmetric, no bruits, no thyromegaly LUNGS:  Clear to auscultation bilaterally CHEST: Few coarse wheezes HEART:  PMI not displaced or sustained,S1 and S2 within normal limits, no S3, no S4, no clicks, no rubs, no murmurs ABD:  Flat, positive bowel sounds normal in frequency in pitch, no bruits, no rebound, no guarding, no midline pulsatile mass, no hepatomegaly, no splenomegaly EXT:  2 plus pulses throughout, no edema, no cyanosis no clubbing    EKG:  EKG is not ordered today.    Recent Labs: 10/24/2018: Brain Natriuretic Peptide 32 01/15/2019: ALT 21; BUN 12; Creatinine, Ser 1.02; Potassium 4.5; Sodium 140; TSH 1.62 03/17/2019: Hemoglobin 13.9; Platelets 231.0 06/18/2019: NT-Pro BNP 42    Lipid Panel    Component Value Date/Time   CHOL 174 12/13/2017 0829   CHOL 158 02/16/2016 0826   TRIG 108.0 12/13/2017 0829   HDL 59.10 12/13/2017 0829   HDL 50 02/16/2016 0826   CHOLHDL 3 12/13/2017 0829   VLDL 21.6 12/13/2017 0829   LDLCALC 94  12/13/2017 0829   LDLCALC 82 02/16/2016 0826      Wt Readings from Last 3 Encounters:  07/17/19 209 lb 6.4 oz (95 kg)  06/18/19 202 lb (91.6 kg)  04/14/19 202 lb (91.6 kg)      Other studies Reviewed: Additional studies/ records that were reviewed today include:  Care Everywhere Review of the above records demonstrates:  Please see elsewhere in the note.     ASSESSMENT AND PLAN:   PALPITATIONS:     Today I am going to increase her Cardizem to 240 mg daily.   Given the intermittent rapid  heart rate I will go ahead and give her Cardizem CD 120.  She had a normal echocardiogram. She also had normal inflammatory markers and troponin. I do not think that MRI would be very revealing.   COVID:    She is going to get the vaccine.  COUGH: She is going to follow-up with our pulmonary Covid clinic at the end of the month. She will continue the meds as listed.  Current medicines are reviewed at length with the patient today.  The patient does not have concerns regarding medicines.  The following changes have been made:  As above  Labs/ tests ordered today include:   No orders of the defined types were placed in this encounter.    Disposition:   FU with me in 3 month.    Signed, Minus Breeding, MD  07/17/2019 3:04 PM    [facility]

## 2019-07-17 ENCOUNTER — Encounter: Payer: Self-pay | Admitting: Cardiology

## 2019-07-17 ENCOUNTER — Other Ambulatory Visit: Payer: Self-pay

## 2019-07-17 ENCOUNTER — Ambulatory Visit: Payer: BC Managed Care – PPO | Admitting: Cardiology

## 2019-07-17 VITALS — BP 122/73 | HR 87 | Temp 97.0°F | Ht 64.0 in | Wt 209.4 lb

## 2019-07-17 DIAGNOSIS — R05 Cough: Secondary | ICD-10-CM | POA: Diagnosis not present

## 2019-07-17 DIAGNOSIS — R002 Palpitations: Secondary | ICD-10-CM | POA: Diagnosis not present

## 2019-07-17 DIAGNOSIS — R058 Other specified cough: Secondary | ICD-10-CM

## 2019-07-17 DIAGNOSIS — Z7189 Other specified counseling: Secondary | ICD-10-CM | POA: Diagnosis not present

## 2019-07-17 DIAGNOSIS — R059 Cough, unspecified: Secondary | ICD-10-CM

## 2019-07-17 MED ORDER — DILTIAZEM HCL ER COATED BEADS 240 MG PO CP24: 240.0000 mg | ORAL_CAPSULE | Freq: Every day | ORAL | 1 refills | Status: DC

## 2019-07-17 MED ORDER — PANTOPRAZOLE SODIUM 40 MG PO TBEC: 40.0000 mg | DELAYED_RELEASE_TABLET | Freq: Every day | ORAL | 2 refills | Status: DC

## 2019-07-17 NOTE — Addendum Note (Signed)
Addended by: Caprice Beaver T on: 07/17/2019 04:46 PM   Modules accepted: Orders

## 2019-07-17 NOTE — Patient Instructions (Signed)
Medication Instructions:  Increase Cardizem to 240 mg daily   *If you need a refill on your cardiac medications before your next appointment, please call your pharmacy*  Follow-Up: At [facility], you and your health needs are our priority.  As part of our continuing mission to provide you with exceptional heart care, we have created designated Provider Care Teams.  These Care Teams include your primary Cardiologist (physician) and Advanced Practice Providers (APPs -  Physician Assistants and Nurse Practitioners) who all work together to provide you with the care you need, when you need it.  We recommend signing up for the patient portal called "MyChart".  Sign up information is provided on this After Visit Summary.  MyChart is used to connect with patients for Virtual Visits (Telemedicine).  Patients are able to view lab/test results, encounter notes, upcoming appointments, etc.  Non-urgent messages can be sent to your provider as well.   To learn more about what you can do with MyChart, go to [email or web address].    Your next appointment:   3 month(s)  The format for your next appointment:   In Person  Provider:   Minus Breeding, MD

## 2019-07-21 ENCOUNTER — Other Ambulatory Visit: Payer: Self-pay | Admitting: Internal Medicine

## 2019-07-21 DIAGNOSIS — R058 Other specified cough: Secondary | ICD-10-CM

## 2019-07-21 DIAGNOSIS — R05 Cough: Secondary | ICD-10-CM

## 2019-07-21 MED ORDER — PANTOPRAZOLE SODIUM 40 MG PO TBEC: 40.0000 mg | DELAYED_RELEASE_TABLET | Freq: Every day | ORAL | 2 refills | Status: DC

## 2019-07-30 ENCOUNTER — Other Ambulatory Visit: Payer: Self-pay

## 2019-07-30 ENCOUNTER — Encounter: Payer: Self-pay | Admitting: Pulmonary Disease

## 2019-07-30 ENCOUNTER — Ambulatory Visit: Payer: BC Managed Care – PPO | Admitting: Pulmonary Disease

## 2019-07-30 VITALS — BP 118/80 | HR 86 | Temp 98.1°F | Ht 65.0 in | Wt 210.4 lb

## 2019-07-30 DIAGNOSIS — R05 Cough: Secondary | ICD-10-CM | POA: Diagnosis not present

## 2019-07-30 DIAGNOSIS — R0602 Shortness of breath: Secondary | ICD-10-CM | POA: Diagnosis not present

## 2019-07-30 DIAGNOSIS — R058 Other specified cough: Secondary | ICD-10-CM

## 2019-07-30 NOTE — Progress Notes (Signed)
Cassie [name]    [id number]    Sep 15, 1970  Primary Care Physician:Gutierrez, Garlon Hatchet, MD  Referring Physician: Ria Bush, MD [address] [place],  [place] [address]  Chief complaint: Consult for chronic cough, post Covid  HPI: 49 year old with allergies, asthma, Covid infection in August 2020 Has prolonged long-haul Covid presenting as cough, fatigue, exertion, hoarseness of voice, cough.  Cassie [name] is seen multiple pulmonary providers including Dr. Melvyn Novas in this office, [facility] and [facility] with negative high-res CT in December 2020, PFTs at [facility] pulmonary was reportedly normal per report.  Cassie [name] had a borderline elevated D-dimer in early July at [facility] and CTA showed mild groundglass opacities with focal fibrosis [report below]  Has some response from high-dose steroids in the past.  Inhalers have not helped.  Cassie [name] has been treated well for postnasal drip, GERD without any change, also evaluated by Dr. Joya Gaskins ENT at [facility] for vocal cord issues.  Laryngoscopy showed mild laryngeal edema and complete closure.  Cassie [name] is is being treated with Elavil, Neurontin.  More recently Cassie [name] has started speech therapy for irritable larynx with some improvement in hoarseness of voice.  Cassie [name] follows with Dr. Percival Spanish, cardiology for palpitations and has been started on calcium channel blocker  Pets: No pets Occupation: Works as a Retail buyer Exposures: No known exposures.  No mold, hot tub, Jacuzzi.  No feather pillows or comforters Smoking history: Never smoker Travel history: Originally from [place]. Relevant family history: No significant family issue of lung disease.  Outpatient Encounter Medications as of 07/30/2019  Medication Sig  . albuterol (PROVENTIL) (2.5 MG/3ML) 0.083% nebulizer solution Take 3 mLs (2.5 mg total) by nebulization every 6 (six) hours as needed for wheezing or shortness of breath.  Marland Kitchen amitriptyline (ELAVIL) 50 MG tablet  Take 50 mg by mouth at bedtime.  . Biotin 5 MG CAPS Take 1 capsule (5 mg total) by mouth daily.  . chlorpheniramine (CHLOR-TRIMETON) 4 MG tablet Take by mouth. 4 times daily  . cholecalciferol (VITAMIN D3) 25 MCG (1000 UNIT) tablet Take 1,000 Units by mouth daily.  Marland Kitchen Dextromethorphan Polistirex (DELSYM PO) Take by mouth daily.  Marland Kitchen diltiazem (CARDIZEM CD) 240 MG 24 hr capsule Take 1 capsule (240 mg total) by mouth daily.  . famotidine (PEPCID) 20 MG tablet One after supper  . Fluticasone-Salmeterol (ADVAIR DISKUS) 500-50 MCG/DOSE AEPB   . gabapentin (NEURONTIN) 300 MG capsule TAKE 1 CAPSULE (300 MG TOTAL) BY MOUTH 4 (FOUR) TIMES DAILY.  Marland Kitchen ipratropium (ATROVENT) 0.02 % nebulizer solution Inhale into the lungs.  . pantoprazole (PROTONIX) 40 MG tablet Take 1 tablet (40 mg total) by mouth daily. Take 30-60 min before first meal of the day  . Respiratory Therapy Supplies (FLUTTER) DEVI Use as directed   No facility-administered encounter medications on file as of 07/30/2019.    Allergies as of 07/30/2019 - Review Complete 07/30/2019  Allergen Reaction Noted  . Fish allergy Anaphylaxis, Hives, and Swelling 02/09/2016  . Shellfish allergy Anaphylaxis, Hives, and Swelling 10/07/2013  . Chocolate  01/03/2017  . Cocoa Other (See Comments) 01/03/2017  . Penicillins Rash and Other (See Comments) 02/09/2016  . Tape Rash 02/09/2016    Past Medical History:  Diagnosis Date  . Allergy   . Asthma   . Cardiac disease   . Diffuse cystic mastopathy   . GERD (gastroesophageal reflux disease)   . Guttate psoriasis   . H/O syncope  cards work-up WNL (Hochrein) thought 2/2 overmedication vs arrhythmias  . Hx of endometriosis    hysterectomy  . Hx of migraines    occasional; improved since hysterectomy  . Labral tear of shoulder 02/2014   R, pending surgery Tamera Punt)  . Rosacea    mild    Past Surgical History:  Procedure Laterality Date  . BREAST BIOPSY  2010   left; core biopsy  . BREAST  EXCISIONAL BIOPSY Bilateral    multiple bilateral  . BREAST MASS EXCISION  2008  . BREAST SURGERY Right    excision of lesion  . CARDIAC CATHETERIZATION  2007  . CHOLECYSTECTOMY  2008  . COLONOSCOPY  2006   WNL Nicolasa Ducking)  . COLONOSCOPY  06/2013   mod diverticulosis, rpt 10 yrs Henrene Pastor)  . LAPAROSCOPY  1994   endometriosis  . TONSILLECTOMY  1994  . TONSILLECTOMY AND ADENOIDECTOMY  1995  . TOTAL ABDOMINAL HYSTERECTOMY     Dr Rayford Halsted for endometriosis    Family History  Problem Relation Age of Onset  . Skin cancer Mother        melanoma  . Hypertension Mother        also had polio  . Irritable bowel syndrome Mother   . Breast cancer Mother 83  . Coronary artery disease Father        age 31's  . Hypertension Father   . Breast cancer Maternal Grandmother 66  . Stroke Maternal Grandmother   . Other Son        hyperinsulinemic  . Coronary artery disease Paternal Uncle        age 71's  . Skin cancer Sister 69       melanoma - cancer in eye and uterus  . Thyroid disease Sister        hashimoto thyroiditis  . Breast cancer Other        pat. great aunt  . Irritable bowel syndrome Daughter   . Diabetes Maternal Grandfather   . Heart disease Maternal Grandfather   . Diabetes Paternal Uncle   . Epilepsy Son   . Breast cancer Cousin        second cousin  . Pulmonary embolism Other        died from this after MVA  . Heart disease Paternal Grandmother   . Heart disease Paternal Grandfather   . Colon cancer Neg Hx     Social History   Socioeconomic History  . Marital status: Married    Spouse name: Not on file  . Number of children: 2  . Years of education: Not on file  . Highest education level: Not on file  Occupational History  . Occupation: Glass blower/designer  Tobacco Use  . Smoking status: Former Smoker    Quit date: 02/21/2007    Years since quitting: 12.4  . Smokeless tobacco: Never Used  Vaping Use  . Vaping Use: Never used  Substance and Sexual Activity  .  Alcohol use: Yes    Alcohol/week: 0.0 standard drinks    Comment: occas  . Drug use: No  . Sexual activity: Yes    Birth control/protection: Surgical  Other Topics Concern  . Not on file  Social History Narrative   Glass blower/designer   Lives with husband; 2 children; 1 dog   Son in prison   Social Determinants of Health   Financial Resource Strain:   . Difficulty of Paying Living Expenses:   Food Insecurity:   . Worried About [employer] of  Food in the Last Year:   . [place] in the Last Year:   Transportation Needs:   . Film/video editor (Medical):   Marland Kitchen Lack of Transportation (Non-Medical):   Physical Activity:   . Days of Exercise per Week:   . Minutes of Exercise per Session:   Stress:   . Feeling of Stress :   Social Connections:   . Frequency of Communication with Friends and Family:   . Frequency of Social Gatherings with Friends and Family:   . Attends Religious Services:   . Active Member of Clubs or Organizations:   . Attends Archivist Meetings:   Marland Kitchen Marital Status:   Intimate Partner Violence:   . Fear of Current or Ex-Partner:   . Emotionally Abused:   Marland Kitchen Physically Abused:   . Sexually Abused:     Review of systems: Review of Systems  Constitutional: Negative for fever and chills.  HENT: Negative.   Eyes: Negative for blurred vision.  Respiratory: as per HPI  Cardiovascular: Negative for chest pain and palpitations.  Gastrointestinal: Negative for vomiting, diarrhea, blood per rectum. Genitourinary: Negative for dysuria, urgency, frequency and hematuria.  Musculoskeletal: Negative for myalgias, back pain and joint pain.  Skin: Negative for itching and rash.  Neurological: Negative for dizziness, tremors, focal weakness, seizures and loss of consciousness.  Endo/Heme/Allergies: Negative for environmental allergies.  Psychiatric/Behavioral: Negative for depression, suicidal ideas and hallucinations.  All other systems reviewed and are  negative.  Physical Exam: Blood pressure 118/80, pulse 86, temperature 98.1 F (36.7 C), temperature source Oral, height 5\' 5"  (1.651 m), weight (!) 210 lb 6.4 oz (95.4 kg), last menstrual period 01/27/2000, SpO2 99 %. Gen:      No acute distress HEENT:  EOMI, sclera anicteric, significant dysphonia Neck:     No masses; no thyromegaly Lungs:    Clear to auscultation bilaterally; normal respiratory effort CV:         Regular rate and rhythm; no murmurs Abd:      + bowel sounds; soft, non-tender; no palpable masses, no distension Ext:    No edema; adequate peripheral perfusion Skin:      Warm and dry; no rash Neuro: alert and oriented x 3 Psych: normal mood and affect  Data Reviewed: Imaging: High-resolution CT 12/10/2018-no evidence of fibrotic interstitial lung disease, mild air trapping  CTA report from [facility] 07/10/2019-Bilateral lower lobe dependent heterogenous ground glass opacities, favoring dependent subsegmental atelectasis. Focal ground glass opacity involving the medial basal segment of right lower lobe, favoring fibrosis. No suspicious pulmonary nodule. No pleural effusion or pneumothorax.   I have reviewed the images personally  PFTs:  Labs:  Assessment:  Long-haul Covid Atypical presentation with mostly vocal cord issues, dysphonia, irritable larynx. Not sure if there is much more we can offer.  I have asked Cassie [name] to continue the Neurontin, Elavil and speech therapy  Initial high-res CT was negative but the report from [facility] on 07/10/2019 is read as new groundglass opacities with focal fibrosis.  It is odd that Cassie [name] would develop interstitial lung disease now with normal scan in December  However given the change I will get a high-res CT and PFTs for better evaluation of the lung. No desats on ambulation in office today Cassie [name] may benefit from pulmonary rehab.  Will discuss with Cassie [name] on return visit.  Plan/Recommendations: High-res CT, PFTs  Marshell Garfinkel  MD [facility] 07/30/2019, 3:05 PM  CC: Ria Bush,  MD   

## 2019-07-30 NOTE — Patient Instructions (Signed)
We will schedule you for high-resolution CT and PFTs for evaluation of the lung Follow-up after completion of these tests.

## 2019-07-31 ENCOUNTER — Encounter: Payer: Self-pay | Admitting: Pulmonary Disease

## 2019-08-05 ENCOUNTER — Telehealth: Payer: Self-pay | Admitting: *Deleted

## 2019-08-05 NOTE — Telephone Encounter (Signed)
Called [facility] pre-admit testing and spoke with Caryl Pina re: rescheduling C19 testing from 8/13 to 8/18.  Pre-Admit will contact patient. Nothing further needed.

## 2019-08-07 ENCOUNTER — Telehealth: Payer: Self-pay | Admitting: *Deleted

## 2019-08-07 ENCOUNTER — Other Ambulatory Visit: Payer: Self-pay

## 2019-08-07 ENCOUNTER — Ambulatory Visit
Admission: RE | Admit: 2019-08-07 | Discharge: 2019-08-07 | Disposition: A | Discharge status: Home / Self Care | Payer: BC Managed Care – PPO | Source: Ambulatory Visit | Attending: Pulmonary Disease | Admitting: Pulmonary Disease

## 2019-08-07 DIAGNOSIS — Z1231 Encounter for screening mammogram for malignant neoplasm of breast: Secondary | ICD-10-CM

## 2019-08-07 DIAGNOSIS — R918 Other nonspecific abnormal finding of lung field: Secondary | ICD-10-CM | POA: Diagnosis not present

## 2019-08-07 DIAGNOSIS — R911 Solitary pulmonary nodule: Secondary | ICD-10-CM | POA: Diagnosis not present

## 2019-08-07 DIAGNOSIS — R0602 Shortness of breath: Secondary | ICD-10-CM

## 2019-08-07 DIAGNOSIS — J984 Other disorders of lung: Secondary | ICD-10-CM | POA: Diagnosis not present

## 2019-08-07 DIAGNOSIS — J219 Acute bronchiolitis, unspecified: Secondary | ICD-10-CM | POA: Diagnosis not present

## 2019-08-07 NOTE — Telephone Encounter (Signed)
Patient called and stated that she has seen Dr Jamal Collin in the past to follow up for mammogram, and she has stopped following up due to [place]. Patient wants to see if we can order her another mammogram and follow up with one of our doctors here. Please call and advise

## 2019-08-07 NOTE — Telephone Encounter (Signed)
Order placed for screening mammogram at [facility]. I will have the patient schedule this and then call us back to schedule a follow up with Dr Hampton Abbot.

## 2019-08-14 ENCOUNTER — Other Ambulatory Visit: Payer: BC Managed Care – PPO

## 2019-08-19 ENCOUNTER — Other Ambulatory Visit: Payer: Self-pay

## 2019-08-19 ENCOUNTER — Other Ambulatory Visit
Admission: RE | Admit: 2019-08-19 | Discharge: 2019-08-19 | Disposition: A | Discharge status: Home / Self Care | Payer: BC Managed Care – PPO | Source: Ambulatory Visit | Attending: Pulmonary Disease | Admitting: Pulmonary Disease

## 2019-08-19 DIAGNOSIS — Z01812 Encounter for preprocedural laboratory examination: Secondary | ICD-10-CM | POA: Diagnosis not present

## 2019-08-19 DIAGNOSIS — Z20822 Contact with and (suspected) exposure to covid-19: Secondary | ICD-10-CM | POA: Insufficient documentation

## 2019-08-19 LAB — SARS CORONAVIRUS 2 (TAT 6-24 HRS): SARS Coronavirus 2: NEGATIVE

## 2019-08-20 ENCOUNTER — Ambulatory Visit: Payer: BC Managed Care – PPO | Admitting: Pulmonary Disease

## 2019-08-21 ENCOUNTER — Ambulatory Visit (INDEPENDENT_AMBULATORY_CARE_PROVIDER_SITE_OTHER): Payer: BC Managed Care – PPO | Admitting: Pulmonary Disease

## 2019-08-21 ENCOUNTER — Ambulatory Visit: Payer: BC Managed Care – PPO | Admitting: Pulmonary Disease

## 2019-08-21 ENCOUNTER — Encounter: Payer: Self-pay | Admitting: Pulmonary Disease

## 2019-08-21 ENCOUNTER — Other Ambulatory Visit: Payer: Self-pay

## 2019-08-21 VITALS — BP 120/78 | HR 90 | Temp 97.3°F | Ht 65.0 in | Wt 206.0 lb

## 2019-08-21 DIAGNOSIS — J4521 Mild intermittent asthma with (acute) exacerbation: Secondary | ICD-10-CM | POA: Diagnosis not present

## 2019-08-21 DIAGNOSIS — R0602 Shortness of breath: Secondary | ICD-10-CM

## 2019-08-21 DIAGNOSIS — Z8616 Personal history of COVID-19: Secondary | ICD-10-CM | POA: Diagnosis not present

## 2019-08-21 DIAGNOSIS — R0609 Other forms of dyspnea: Secondary | ICD-10-CM

## 2019-08-21 DIAGNOSIS — R0600 Dyspnea, unspecified: Secondary | ICD-10-CM

## 2019-08-21 DIAGNOSIS — R058 Other specified cough: Secondary | ICD-10-CM

## 2019-08-21 DIAGNOSIS — F488 Other specified nonpsychotic mental disorders: Secondary | ICD-10-CM

## 2019-08-21 DIAGNOSIS — R918 Other nonspecific abnormal finding of lung field: Secondary | ICD-10-CM

## 2019-08-21 DIAGNOSIS — R4189 Other symptoms and signs involving cognitive functions and awareness: Secondary | ICD-10-CM

## 2019-08-21 DIAGNOSIS — R490 Dysphonia: Secondary | ICD-10-CM

## 2019-08-21 DIAGNOSIS — R05 Cough: Secondary | ICD-10-CM

## 2019-08-21 LAB — PULMONARY FUNCTION TEST
DL/VA % pred: 110 %
DL/VA: 4.77 ml/min/mmHg/L
DLCO cor % pred: 112 %
DLCO cor: 24.9 ml/min/mmHg
DLCO unc % pred: 112 %
DLCO unc: 24.9 ml/min/mmHg
FEF 25-75 Post: 2.93 L/sec
FEF 25-75 Pre: 3.18 L/sec
FEF2575-%Change-Post: -8 %
FEF2575-%Pred-Post: 98 %
FEF2575-%Pred-Pre: 107 %
FEV1-%Change-Post: -3 %
FEV1-%Pred-Post: 94 %
FEV1-%Pred-Pre: 97 %
FEV1-Post: 2.83 L
FEV1-Pre: 2.92 L
FEV1FVC-%Change-Post: 0 %
FEV1FVC-%Pred-Pre: 103 %
FEV6-%Change-Post: -2 %
FEV6-%Pred-Post: 92 %
FEV6-%Pred-Pre: 95 %
FEV6-Post: 3.39 L
FEV6-Pre: 3.49 L
FEV6FVC-%Pred-Post: 102 %
FEV6FVC-%Pred-Pre: 102 %
FVC-%Change-Post: -2 %
FVC-%Pred-Post: 90 %
FVC-%Pred-Pre: 93 %
FVC-Post: 3.39 L
FVC-Pre: 3.49 L
Post FEV1/FVC ratio: 83 %
Post FEV6/FVC ratio: 100 %
Pre FEV1/FVC ratio: 84 %
Pre FEV6/FVC Ratio: 100 %
RV % pred: 115 %
RV: 2.02 L
TLC % pred: 105 %
TLC: 5.51 L

## 2019-08-21 NOTE — Assessment & Plan Note (Signed)
Brain fog status COVID-19 infection  Plan: Referral to neurology

## 2019-08-21 NOTE — Assessment & Plan Note (Signed)
Unsure the cause of this No formal obstruction on pulmonary function testing High-resolution CT chest shows no interstitial lung disease  Plan: Stop Advair 500 Could consider low-dose ICS/LABA if patient is symptomatic given air trapping on high-resolution CT chest

## 2019-08-21 NOTE — Assessment & Plan Note (Signed)
Ongoing symptoms of long Covid Has been evaluated at [facility] pulmonary, [facility] pulmonary and our office High-resolution CT chest reviewed today and pulmonary function testing reviewed today Patient with ongoing atypical symptoms of long Covid with more upper airway irritable larynx syndrome No formal obstruction or interstitial lung disease seen  Plan: Referral to pulmonary rehab today Stop Advair 500 31-month follow-up with our office Referral to neurology patient continues to have symptoms of ongoing brain fog since COVID-19

## 2019-08-21 NOTE — Patient Instructions (Addendum)
You were seen today by Lauraine Rinne, NP  for:   1. History of COVID-19 2. DOE (dyspnea on exertion)  - AMB referral to pulmonary rehabilitation  Your pulmonary function test does not show any formal obstruction  Given your upper airway cough syndrome as well as your vocal cord dysfunction followed by Dr. Joya Gaskins with [facility] ENT I believe you should stop your Advair 500  Okay to continue to use your rescue inhaler  3. Brain fog -post COVID-19  - Ambulatory referral to Neurology  4. Mild intermittent extrinsic asthma with acute exacerbation  - AMB referral to pulmonary rehabilitation  Stop Advair 500 today  5. Upper airway cough syndrome  Keep follow-up with [facility] ENT  Continue Elavil Continue Neurontin Continue Protonix   We recommend today:  Orders Placed This Encounter  Procedures  . Ambulatory referral to Neurology    Referral Priority:   Routine    Referral Type:   Consultation    Referral Reason:   Specialty Services Required    Requested Specialty:   Neurology    Number of Visits Requested:   1  . AMB referral to pulmonary rehabilitation    Referral Priority:   Routine    Referral Type:   Consultation    Number of Visits Requested:   1   Orders Placed This Encounter  Procedures  . Ambulatory referral to Neurology  . AMB referral to pulmonary rehabilitation   No orders of the defined types were placed in this encounter.   Follow Up:    Return in about 3 months (around 11/21/2019), or if symptoms worsen or fail to improve, for Follow up with Dr. Vaughan Browner.   Please do your part to reduce the spread of COVID-19:      Reduce your risk of any infection  and COVID19 by using the similar precautions used for avoiding the common cold or flu:  Marland Kitchen Wash your hands often with soap and warm water for at least 20 seconds.  If soap and water are not readily available, use an alcohol-based hand sanitizer with at least 60% alcohol.  . If coughing or  sneezing, cover your mouth and nose by coughing or sneezing into the elbow areas of your shirt or coat, into a tissue or into your sleeve (not your hands). Langley Gauss A MASK when in public  . Avoid shaking hands with others and consider head nods or verbal greetings only. . Avoid touching your eyes, nose, or mouth with unwashed hands.  . Avoid close contact with people who are sick. . Avoid places or events with large numbers of people in one location, like concerts or sporting events. . If you have some symptoms but not all symptoms, continue to monitor at home and seek medical attention if your symptoms worsen. . If you are having a medical emergency, call 911.   [place] / e-Visit: [email or web address]         [facility]: [facility]: [phone]                   [facility]: [phone]     It is flu season:   >>> Best ways to protect herself from the flu: Receive the yearly flu vaccine, practice good hand hygiene washing with soap and also using hand sanitizer when available, eat a nutritious meals, get adequate rest, hydrate appropriately   Please contact the  office if your symptoms worsen or you have concerns that you are not improving.   Thank you for choosing [facility] for your healthcare, and for allowing [place] to partner with you on your healthcare journey. I am thankful to be able to provide care to you today.   Wyn Quaker FNP-C

## 2019-08-21 NOTE — Assessment & Plan Note (Signed)
Plan:  keep follow-up with Dr. Joya Gaskins at [facility] as managed by [facility] ENT Stop Advair 500

## 2019-08-21 NOTE — Assessment & Plan Note (Signed)
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

## 2019-08-21 NOTE — Progress Notes (Signed)
@Patient  ID: Cassie [name], female    DOB: Feb 05, 1970, 49 y.o.   MRN: [id number]  Chief Complaint  Patient presents with  . Follow-up    pft and ct results    Referring provider: Ria Bush, MD  HPI:  49 year old female former smoker followed in our office for long Covid syndrome  PMH: Syncope, obesity, vitamin D deficiency, history of COVID-19 infection Smoker/ Smoking History: Former smoker Maintenance:   Pt of: Dr. Vaughan Browner  08/21/2019  - Visit   49 year old female former smoker followed in our office for long Covid syndrome.  She last saw Dr. Vaughan Browner in July/2021.  Per that documentation patient contracted Covid in August/2020.  Has a history of allergies and asthma.  She has seen multiple pulmonary providers including Dr. Melvyn Novas, [facility], [facility] pulmonary, negative high-resolution CT chest in December/2020, pulmonary function testing from [facility] pulmonary reportedly normal per report, had a borderline elevated D-dimer in early July/2021 and CTA chest showed mild groundglass opacities with focal fibrosis.  Has some response from high steroids in the past, inhalers have not helped.  She has been treated well for postnasal drip and acid reflux without much change.  She has been evaluated by Dr. Joya Gaskins ear nose and throat at [facility] for vocal cord issues.  Laryngoscopic showed mild laryngeal edema and complete closure.  She is currently being treated with Elavil, Neurontin.  She is also started speech therapy.  After evaluation in July/2021 Dr. Vaughan Browner found that this was an atypical presentation. Unsure if there is much more from a pulmonary standpoint we could offer but he repeated high-resolution CT imaging as well as pulmonary function testing.  Patient presents today to discuss his results.  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 Joya Gaskins ENT at [facility].  She remains on Elavil and Neurontin for management of upper airway irritation.  She continues to be maintained on chlor tabs every 6 hours as well as Protonix.  Patient remains adherent to Advair 500.  She is unsure who put her on this but she believes it was [facility] pulmonary.  She has not noticed much clinical relief.  She has been on a plethora of other inhalers that did not provide clinical relief.  We will discuss and evaluate this today.  Patient has not been referred to pulmonary rehab before.  She also continues to deal with brain fog status post COVID-19 infection in August/2020.  She struggles with multitasking as well as following a train of thought.  She has not been evaluated by neurology.   Questionaires / Pulmonary Flowsheets:   ACT:  No flowsheet data found.  MMRC: mMRC Dyspnea Scale mMRC Score  07/30/2019 3    Epworth:  No flowsheet data found.  Tests:   High-resolution CT 12/10/2018-no evidence of fibrotic interstitial lung disease, mild air trapping  CTA report from [facility] 07/10/2019-Bilateral lower lobe dependent heterogenous ground glass opacities, favoring dependent subsegmental atelectasis. Focal ground glass opacity involving the medial basal segment of right lower lobe,  favoring fibrosis. No suspicious pulmonary nodule. No pleural effusion or pneumothorax.   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), postbronchodilator ratio 83, postbronchodilator FEV1 2.83 (94% addicted), no bronchodilator response, TLC 5.51 (105% predicted), DLCO 24.90 (112 percent predicted)  02/11/2019-echocardiogram-LV ejection fraction 60 to 65%, right ventricular systolic function normal, normal pulmonary artery systolic pressure  FENO:  No results found for: NITRICOXIDE  PFT: PFT Results Latest Ref Rng & Units 08/21/2019  FVC-Pre L 3.49  FVC-Predicted Pre % 93  FVC-Post L 3.39  FVC-Predicted Post % 90  Pre FEV1/FVC % % 84  Post FEV1/FCV % % 83  FEV1-Pre L 2.92  FEV1-Predicted Pre % 97  FEV1-Post L 2.83  DLCO uncorrected ml/min/mmHg 24.90  DLCO UNC% % 112  DLCO corrected ml/min/mmHg 24.90  DLCO COR %Predicted % 112  DLVA Predicted % 110  TLC L 5.51  TLC % Predicted % 105  RV % Predicted % 115    WALK:  SIX MIN WALK 07/30/2019 04/14/2019 11/21/2018  Supplimental Oxygen during Test? (L/min) No No No  Tech Comments: Patient ambulated at a fast, steady pace, no stopping, Patient did have shob, but no signs of distress moderate pace/increased cough//lmr Patient walked moderate speed. Patient started coughing on last lap and O2 dropped to 90%. mrh    Imaging: CT Chest High Resolution  Result Date: 08/08/2019 CLINICAL DATA:  Post COVID, dyspnea, cough EXAM: CT CHEST WITHOUT CONTRAST TECHNIQUE: Multidetector CT imaging of the chest was performed following the standard protocol without intravenous contrast. High resolution imaging of the lungs, as well as inspiratory and expiratory imaging, was performed. COMPARISON:  12/10/2018 FINDINGS: Cardiovascular: No significant vascular findings. Normal heart size. No pericardial effusion.  Mediastinum/Nodes: No enlarged mediastinal, hilar, or axillary lymph nodes. Thyroid gland, trachea, and esophagus demonstrate no significant findings. Lungs/Pleura: Mild lobular air trapping on expiratory phase imaging. No evidence of fibrotic interstitial lung disease. Bland appearing scarring of the medial right lung base adjacent to disc osteophytes. Redemonstrated stable, benign 5 mm pulmonary nodule of the azygoesophageal recess of the right lower lobe (series 11, image 120). There are scattered tiny centrilobular nodules, most numerous in the lung apices, which appear to be new in comparison to prior examination. No pleural effusion or pneumothorax. Upper Abdomen: No acute abnormality. Musculoskeletal: No chest wall mass or suspicious bone lesions identified. IMPRESSION: 1. No evidence of fibrotic interstitial lung disease. 2. Mild lobular air trapping on expiratory phase imaging, suggestive of small airways disease. 3. There are scattered tiny centrilobular nodules, most numerous in the lung apices, which appear to be new in comparison to prior examination. These are nonspecific and infectious or inflammatory, most commonly seen in smoking-related respiratory bronchiolitis. Electronically Signed   By: Eddie Candle M.D.   On: 08/08/2019 11:58    Lab Results:  CBC    Component Value Date/Time   WBC 8.6 03/17/2019 1203   RBC 4.14 03/17/2019 1203   HGB 13.9 03/17/2019 1203   HCT 39.8 03/17/2019 1203   PLT 231.0 03/17/2019 1203   MCV 96.2 03/17/2019 1203   MCH 33.0 10/24/2018 1630   MCHC 34.9 03/17/2019 1203   RDW 14.0 03/17/2019 1203   LYMPHSABS 1.3 03/17/2019 1203  MONOABS 0.6 03/17/2019 1203   EOSABS 0.2 03/17/2019 1203   BASOSABS 0.1 03/17/2019 1203    BMET    Component Value Date/Time   NA 140 01/15/2019 1342   K 4.5 01/15/2019 1342   CL 104 01/15/2019 1342   CO2 30 01/15/2019 1342   GLUCOSE 94 01/15/2019 1342   BUN 12 01/15/2019 1342   CREATININE 1.02 01/15/2019 1342    CREATININE 1.04 10/24/2018 1630   CALCIUM 9.6 01/15/2019 1342   GFRNONAA >60 09/15/2018 1330   GFRAA >60 09/15/2018 1330    BNP    Component Value Date/Time   BNP 32 10/24/2018 1630    ProBNP    Component Value Date/Time   PROBNP 42 06/18/2019 1353    Specialty Problems      Pulmonary Problems   Extrinsic asthma   Upper airway cough syndrome    Onset in late  Aug 2020 with Covid 19 pos 08/30/18 not admitted  - cyclical cough rx 34/74/2595 > partial improvement  -HRCT chest 12/10/18 >No evidence of fibrotic interstitial lung disease. 2. Mild, lobular air trapping on expiratory phase imaging,suggestive of small airways disease. - repeat cyclical cough rx with Tyl #4 and add gabapentin 100 qid > some better 03/05/2019 so rec titrate up to 300 mg qid / pred x 12 days  - Allergy profile 03/17/2019 >  Eos 0.2 /  IgE14  >>>    added singulair empirically  - Referred to Dr Eliott Nine with moderate erythema noted > rec Elavil/ speech therapy 04/06/19  - Added flutter valve 04/14/2019 and stopped singulair   Established with [facility] mid 2021 dx post-acute sequelae of COVID infection with dysphonia, cough, dyspnea, tachycardia       DOE (dyspnea on exertion)    Onset aug 2020 with covid 19 > hrct chest neg ILD 12/10/2018 - 04/14/2019   Walked RA x one lap =  approx 250 ft -@ moderately  fast pace stopped due to cough, not sob, with sats 95%                Allergies  Allergen Reactions  . Fish Allergy Anaphylaxis, Hives and Swelling  . Shellfish Allergy Anaphylaxis, Hives and Swelling  . Chocolate     migraines  . Cocoa Other (See Comments)    migraines migraines  . Penicillins Rash and Other (See Comments)    Parents are allergic Has patient had a PCN reaction causing immediate rash, facial/tongue/throat swelling, SOB or lightheadedness with hypotension: Yes Has patient had a PCN reaction causing severe rash involving mucus membranes or skin necrosis: Unk Has  patient had a PCN reaction that required hospitalization: Unk Has patient had a PCN reaction occurring within the last 10 years: No If all of the above answers are "NO", then may proceed with Cephalosporin use.No   . Tape Rash    Immunization History  Administered Date(s) Administered  . Td 04/16/2007  . Tdap 12/20/2017    Past Medical History:  Diagnosis Date  . Allergy   . Asthma   . Cardiac disease   . Diffuse cystic mastopathy   . GERD (gastroesophageal reflux disease)   . Guttate psoriasis   . H/O syncope    cards work-up WNL (Hochrein) thought 2/2 overmedication vs arrhythmias  . Hx of endometriosis    hysterectomy  . Hx of migraines    occasional; improved since hysterectomy  . Labral tear of shoulder 02/2014   R, pending surgery Tamera Punt)  . Rosacea  mild    Tobacco History: Social History   Tobacco Use  Smoking Status Former Smoker  . Packs/day: 0.10  . Years: 5.00  . Pack years: 0.50  . Types: Cigarettes  . Quit date: 02/21/2007  . Years since quitting: 12.5  Smokeless Tobacco Never Used  Tobacco Comment   social smoker   Counseling given: Not Answered Comment: social smoker   Continue to not smoke  Outpatient Encounter Medications as of 08/21/2019  Medication Sig  . albuterol (PROVENTIL) (2.5 MG/3ML) 0.083% nebulizer solution Take 3 mLs (2.5 mg total) by nebulization every 6 (six) hours as needed for wheezing or shortness of breath.  Marland Kitchen amitriptyline (ELAVIL) 50 MG tablet Take 50 mg by mouth at bedtime.  . Biotin 5 MG CAPS Take 1 capsule (5 mg total) by mouth daily.  . chlorpheniramine (CHLOR-TRIMETON) 4 MG tablet Take by mouth. 4 times daily  . cholecalciferol (VITAMIN D3) 25 MCG (1000 UNIT) tablet Take 1,000 Units by mouth daily.  Marland Kitchen Dextromethorphan Polistirex (DELSYM PO) Take by mouth daily.  Marland Kitchen diltiazem (CARDIZEM CD) 240 MG 24 hr capsule Take 1 capsule (240 mg total) by mouth daily.  . famotidine (PEPCID) 20 MG tablet One after supper  .  Fluticasone-Salmeterol (ADVAIR DISKUS) 500-50 MCG/DOSE AEPB   . gabapentin (NEURONTIN) 300 MG capsule TAKE 1 CAPSULE (300 MG TOTAL) BY MOUTH 4 (FOUR) TIMES DAILY.  Marland Kitchen ipratropium (ATROVENT) 0.02 % nebulizer solution Inhale into the lungs.  . pantoprazole (PROTONIX) 40 MG tablet Take 1 tablet (40 mg total) by mouth daily. Take 30-60 min before first meal of the day  . Respiratory Therapy Supplies (FLUTTER) DEVI Use as directed   No facility-administered encounter medications on file as of 08/21/2019.     Review of Systems  Review of Systems  Constitutional: Positive for fatigue. Negative for activity change and fever.  HENT: Positive for voice change. Negative for sinus pressure, sinus pain and sore throat.   Respiratory: Positive for cough and shortness of breath. Negative for wheezing.   Cardiovascular: Negative for chest pain and palpitations.  Gastrointestinal: Negative for diarrhea, nausea and vomiting.  Musculoskeletal: Negative for arthralgias.  Neurological: Negative for dizziness.  Psychiatric/Behavioral: Negative for sleep disturbance. The patient is not nervous/anxious.      Physical Exam  BP 120/78 (BP Location: Left Arm, Cuff Size: Normal)   Pulse 90   Temp (!) 97.3 F (36.3 C) (Oral)   Ht 5\' 5"  (1.651 m)   Wt 206 lb (93.4 kg)   LMP 01/27/2000   SpO2 97%   BMI 34.28 kg/m   Wt Readings from Last 5 Encounters:  08/21/19 206 lb (93.4 kg)  07/30/19 (!) 210 lb 6.4 oz (95.4 kg)  07/17/19 209 lb 6.4 oz (95 kg)  06/18/19 202 lb (91.6 kg)  04/14/19 202 lb (91.6 kg)    BMI Readings from Last 5 Encounters:  08/21/19 34.28 kg/m  07/30/19 35.01 kg/m  07/17/19 35.94 kg/m  06/18/19 33.61 kg/m  04/14/19 33.61 kg/m     Physical Exam Vitals and nursing note reviewed.  Constitutional:      General: She is not in acute distress.    Appearance: Normal appearance. She is obese.  HENT:     Head: Normocephalic and atraumatic.     Right Ear: Tympanic membrane, ear  canal and external ear normal. There is no impacted cerumen.     Left Ear: Tympanic membrane, ear canal and external ear normal. There is no impacted cerumen.     Nose:  Nose normal. No congestion or rhinorrhea.     Mouth/Throat:     Mouth: Mucous membranes are moist.     Pharynx: Oropharynx is clear.  Eyes:     Pupils: Pupils are equal, round, and reactive to light.  Neck:     Thyroid: No thyroid mass, thyromegaly or thyroid tenderness.     Trachea: Trachea normal.     Comments: +hoarseness  Cardiovascular:     Rate and Rhythm: Normal rate and regular rhythm.     Pulses: Normal pulses.     Heart sounds: Normal heart sounds. No murmur heard.   Pulmonary:     Effort: Pulmonary effort is normal. No respiratory distress.     Breath sounds: Normal breath sounds. No decreased air movement. No decreased breath sounds, wheezing or rales.  Musculoskeletal:     Cervical back: Normal range of motion and neck supple. No edema or tenderness.  Lymphadenopathy:     Cervical: No cervical adenopathy.  Skin:    General: Skin is warm and dry.     Capillary Refill: Capillary refill takes less than 2 seconds.  Neurological:     General: No focal deficit present.     Mental Status: She is alert and oriented to person, place, and time. Mental status is at baseline.     Gait: Gait normal.  Psychiatric:        Mood and Affect: Mood normal.        Behavior: Behavior normal.        Thought Content: Thought content normal.        Judgment: Judgment normal.       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 Joya Gaskins at [place] as managed by [facility] ENT Stop Advair 500  Hoarseness of voice Persistent hoarseness since August/2020 COVID-19 infection Followed by [facility] ENT No cervical neck imaging to patient's knowledge, none seen in chart Laryngoscopic evaluation did not show vocal cord dysfunction, did show  mild edema and complete closure Currently in voice rehab with [facility] ENT Currently maintained on Advair 500  Plan: Stop Advair 500 Continue voice rehab Keep follow-up with [facility] ENT We will discuss case with Dr. Vaughan Browner  History of COVID-19 Ongoing symptoms of long Covid Has been evaluated at [facility] pulmonary, [facility] pulmonary and our office High-resolution CT chest reviewed today and pulmonary function testing reviewed today Patient with ongoing atypical symptoms of long Covid with more upper airway irritable larynx syndrome No formal obstruction or interstitial lung disease seen  Plan: Referral to pulmonary rehab today Stop Advair 500 23-month follow-up with our office Referral to neurology patient continues to have symptoms of ongoing brain fog since COVID-19  DOE (dyspnea on exertion) Unsure the cause of this No formal obstruction on pulmonary function testing High-resolution CT chest shows no interstitial lung disease  Plan: Stop Advair 500 Could consider low-dose ICS/LABA if patient is symptomatic given air trapping on high-resolution CT  chest   Brain fog Brain fog status COVID-19 infection  Plan: Referral to neurology  Abnormal findings on diagnostic imaging of lung Scattered small pulmonary nodules on high-resolution CT chest Patient has very limited smoking history, social smoker in college,  less than 1-pack-year smoking history  Plan: Can consider repeat CT chest in 1 year if clinically indicated We will continue to clinically monitor at this time    Return in about 3 months (around 11/21/2019), or if symptoms worsen or fail to improve, for Follow up with Dr. Vaughan Browner.   Lauraine Rinne, NP 08/21/2019   This appointment required 65 minutes of patient care (this includes precharting, chart review, review of results, face-to-face care, etc.).

## 2019-08-21 NOTE — Progress Notes (Signed)
PFT done today. 

## 2019-08-21 NOTE — Assessment & Plan Note (Signed)
Scattered small pulmonary nodules on high-resolution CT chest Patient has very limited smoking history, social smoker in college, less than 1-pack-year smoking history  Plan: Can consider repeat CT chest in 1 year if clinically indicated We will continue to clinically monitor at this time

## 2019-08-21 NOTE — Assessment & Plan Note (Signed)
Persistent hoarseness since August/2020 COVID-19 infection Followed by [facility] ENT No cervical neck imaging to patient's knowledge, none seen in chart Laryngoscopic evaluation did not show vocal cord dysfunction, did show  mild edema and complete closure Currently in voice rehab with [facility] ENT Currently maintained on Advair 500  Plan: Stop Advair 500 Continue voice rehab Keep follow-up with [facility] ENT We will discuss case with Dr. Vaughan Browner

## 2019-08-24 ENCOUNTER — Other Ambulatory Visit: Payer: Self-pay | Admitting: *Deleted

## 2019-08-24 DIAGNOSIS — R0609 Other forms of dyspnea: Secondary | ICD-10-CM

## 2019-08-31 ENCOUNTER — Encounter: Payer: Self-pay | Admitting: Counselor

## 2019-09-03 ENCOUNTER — Encounter: Payer: BC Managed Care – PPO | Attending: Pulmonary Disease | Admitting: *Deleted

## 2019-09-03 ENCOUNTER — Other Ambulatory Visit: Payer: Self-pay

## 2019-09-03 ENCOUNTER — Encounter: Payer: Self-pay | Admitting: *Deleted

## 2019-09-03 DIAGNOSIS — Z87891 Personal history of nicotine dependence: Secondary | ICD-10-CM | POA: Insufficient documentation

## 2019-09-03 DIAGNOSIS — R0609 Other forms of dyspnea: Secondary | ICD-10-CM

## 2019-09-03 DIAGNOSIS — R0600 Dyspnea, unspecified: Secondary | ICD-10-CM | POA: Insufficient documentation

## 2019-09-03 DIAGNOSIS — Z79899 Other long term (current) drug therapy: Secondary | ICD-10-CM | POA: Insufficient documentation

## 2019-09-03 NOTE — Progress Notes (Signed)
Completed virtual orientation today.  EP evaluation is scheduled for Thurs 9/9 at 730am.  Documentation for diagnosis can be found in [facility] encounter 08/21/19.

## 2019-09-08 ENCOUNTER — Ambulatory Visit
Admission: RE | Admit: 2019-09-08 | Discharge: 2019-09-08 | Disposition: A | Discharge status: Home / Self Care | Payer: BC Managed Care – PPO | Source: Ambulatory Visit | Attending: Surgery | Admitting: Surgery

## 2019-09-08 DIAGNOSIS — Z1231 Encounter for screening mammogram for malignant neoplasm of breast: Secondary | ICD-10-CM | POA: Diagnosis not present

## 2019-09-09 ENCOUNTER — Other Ambulatory Visit: Payer: Self-pay | Admitting: Surgery

## 2019-09-09 DIAGNOSIS — R490 Dysphonia: Secondary | ICD-10-CM | POA: Diagnosis not present

## 2019-09-09 DIAGNOSIS — R928 Other abnormal and inconclusive findings on diagnostic imaging of breast: Secondary | ICD-10-CM

## 2019-09-09 DIAGNOSIS — N6489 Other specified disorders of breast: Secondary | ICD-10-CM

## 2019-09-10 ENCOUNTER — Encounter: Payer: Self-pay | Admitting: Surgery

## 2019-09-10 ENCOUNTER — Other Ambulatory Visit: Payer: Self-pay

## 2019-09-10 ENCOUNTER — Ambulatory Visit (INDEPENDENT_AMBULATORY_CARE_PROVIDER_SITE_OTHER): Payer: BC Managed Care – PPO | Admitting: Surgery

## 2019-09-10 ENCOUNTER — Encounter: Payer: BC Managed Care – PPO | Admitting: *Deleted

## 2019-09-10 VITALS — Ht 65.6 in | Wt 206.3 lb

## 2019-09-10 VITALS — BP 127/86 | HR 109 | Temp 99.0°F | Ht 65.0 in | Wt 206.0 lb

## 2019-09-10 DIAGNOSIS — R928 Other abnormal and inconclusive findings on diagnostic imaging of breast: Secondary | ICD-10-CM

## 2019-09-10 DIAGNOSIS — Z79899 Other long term (current) drug therapy: Secondary | ICD-10-CM | POA: Diagnosis not present

## 2019-09-10 DIAGNOSIS — R0609 Other forms of dyspnea: Secondary | ICD-10-CM

## 2019-09-10 DIAGNOSIS — R0600 Dyspnea, unspecified: Secondary | ICD-10-CM | POA: Diagnosis not present

## 2019-09-10 DIAGNOSIS — Z87891 Personal history of nicotine dependence: Secondary | ICD-10-CM | POA: Diagnosis not present

## 2019-09-10 NOTE — Patient Instructions (Addendum)
Dr.Rodenberg discussed with patient the imaging report from [place] should contact patient directly for additional imaging if required. Advise patient to give our office a call if she has any concerns.  Breast Self-Awareness Breast self-awareness is knowing how your breasts look and feel. Doing breast self-awareness is important. It allows you to catch a breast problem early while it is still small and can be treated. All women should do breast self-awareness, including women who have had breast implants. Tell your doctor if you notice a change in your breasts. What you need:  A mirror.  A well-lit room. How to do a breast self-exam A breast self-exam is one way to learn what is normal for your breasts and to check for changes. To do a breast self-exam: Look for changes  1. Take off all the clothes above your waist. 2. Stand in front of a mirror in a room with good lighting. 3. Put your hands on your hips. 4. Push your hands down. 5. Look at your breasts and nipples in the mirror to see if one breast or nipple looks different from the other. Check to see if: ? The shape of one breast is different. ? The size of one breast is different. ? There are wrinkles, dips, and bumps in one breast and not the other. 6. Look at each breast for changes in the skin, such as: ? Redness. ? Scaly areas. 7. Look for changes in your nipples, such as: ? Liquid around the nipples. ? Bleeding. ? Dimpling. ? Redness. ? A change in where the nipples are. Feel for changes  1. Lie on your back on the floor. 2. Feel each breast. To do this, follow these steps: ? Pick a breast to feel. ? Put the arm closest to that breast above your head. ? Use your other arm to feel the nipple area of your breast. Feel the area with the pads of your three middle fingers by making small circles with your fingers. For the first circle, press lightly. For the second circle, press harder. For the third circle,  press even harder. ? Keep making circles with your fingers at the different pressures as you move down your breast. Stop when you feel your ribs. ? Move your fingers a little toward the center of your body. ? Start making circles with your fingers again, this time going up until you reach your collarbone. ? Keep making up-and-down circles until you reach your armpit. Remember to keep using the three pressures. ? Feel the other breast in the same way. 3. Sit or stand in the tub or shower. 4. With soapy water on your skin, feel each breast the same way you did in step 2 when you were lying on the floor. Write down what you find Writing down what you find can help you remember what to tell your doctor. Write down:  What is normal for each breast.  Any changes you find in each breast, including: ? The kind of changes you find. ? Whether you have pain. ? Size and location of any lumps.  When you last had your menstrual period. General tips  Check your breasts every month.  If you are breastfeeding, the best time to check your breasts is after you feed your baby or after you use a breast pump.  If you get menstrual periods, the best time to check your breasts is 5-7 days after your menstrual period is over.  With time, you will become comfortable with  the self-exam, and you will begin to know if there are changes in your breasts. Contact a doctor if you:  See a change in the shape or size of your breasts or nipples.  See a change in the skin of your breast or nipples, such as red or scaly skin.  Have fluid coming from your nipples that is not normal.  Find a lump or thick area that was not there before.  Have pain in your breasts.  Have any concerns about your breast health. Summary  Breast self-awareness includes looking for changes in your breasts, as well as feeling for changes within your breasts.  Breast self-awareness should be done in front of a mirror in a well-lit  room.  You should check your breasts every month. If you get menstrual periods, the best time to check your breasts is 5-7 days after your menstrual period is over.  Let your doctor know of any changes you see in your breasts, including changes in size, changes on the skin, pain or tenderness, or fluid from your nipples that is not normal. This information is not intended to replace advice given to you by your health care provider. Make sure you discuss any questions you have with your health care provider. Document Revised: 08/06/2017 Document Reviewed: 08/06/2017 Elsevier Patient Education  [place].

## 2019-09-10 NOTE — Progress Notes (Signed)
Patient ID: Cassie [name], female   DOB: Jul 07, 1970, 49 y.o.   MRN: [id number]  Chief Complaint: Follow-up mammographic/breast evaluation/high risk patient.  History of Present Illness Cassie [name] is a 49 y.o. female with prior history of being seen by Dr. Jamal Collin with prior biopsy and in office ultrasound evaluations.  Now presents with screening diagnostic exam which has a report of possible asymmetry on the left.  Unfortunately, final imaging was not completed prior to her presentation today.  She has no current breast issues, no skin changes no palpable lumps or masses, no particular pain or discomfort.  She does have a significant family history, and reports her mother is currently dealing with breast cancer and is undergoing genetic evaluation. I believe she was expectant that we could reevaluate this area with ultrasound today, but I am not comfortable doing that. Notable is that she still has lingering issues following her Covid infection which includes some hoarseness to her voice with vocal cord damage, persistent cough, shortness of breath and wheezing.  She has had some skin changes and some hair loss as well.  Her episode with Covid was August 2020.  She has not been vaccinated in lieu of the severity of her disease process.  Past Medical History Past Medical History:  Diagnosis Date  . Allergy   . Asthma   . Cardiac disease   . Diffuse cystic mastopathy   . GERD (gastroesophageal reflux disease)   . Guttate psoriasis   . H/O syncope    cards work-up WNL (Hochrein) thought 2/2 overmedication vs arrhythmias  . Hx of endometriosis    hysterectomy  . Hx of migraines    occasional; improved since hysterectomy  . Labral tear of shoulder 02/2014   R, pending surgery Tamera Punt)  . Rosacea    mild      Past Surgical History:  Procedure Laterality Date  . BREAST BIOPSY  2010   left; core biopsy  . BREAST EXCISIONAL BIOPSY Bilateral    multiple bilateral  . BREAST MASS  EXCISION  2008  . BREAST SURGERY Right    excision of lesion  . CARDIAC CATHETERIZATION  2007  . CHOLECYSTECTOMY  2008  . COLONOSCOPY  2006   WNL Nicolasa Ducking)  . COLONOSCOPY  06/2013   mod diverticulosis, rpt 10 yrs Henrene Pastor)  . LAPAROSCOPY  1994   endometriosis  . TONSILLECTOMY  1994  . TONSILLECTOMY AND ADENOIDECTOMY  1995  . TOTAL ABDOMINAL HYSTERECTOMY     Dr Rayford Halsted for endometriosis    Allergies  Allergen Reactions  . Fish Allergy Anaphylaxis, Hives and Swelling  . Shellfish Allergy Anaphylaxis, Hives and Swelling  . Chocolate     migraines  . Cocoa Other (See Comments)    migraines migraines  . Penicillins Rash and Other (See Comments)    Parents are allergic Has patient had a PCN reaction causing immediate rash, facial/tongue/throat swelling, SOB or lightheadedness with hypotension: Yes Has patient had a PCN reaction causing severe rash involving mucus membranes or skin necrosis: Unk Has patient had a PCN reaction that required hospitalization: Unk Has patient had a PCN reaction occurring within the last 10 years: No If all of the above answers are "NO", then may proceed with Cephalosporin use.No   . Tape Rash    Current Outpatient Medications  Medication Sig Dispense Refill  . albuterol (PROVENTIL) (2.5 MG/3ML) 0.083% nebulizer solution Take 3 mLs (2.5 mg total) by nebulization every 6 (six) hours as needed for wheezing or shortness of  breath. 150 mL 0  . amitriptyline (ELAVIL) 50 MG tablet Take 50 mg by mouth at bedtime.    . Biotin 5 MG CAPS Take 1 capsule (5 mg total) by mouth daily.  0  . chlorpheniramine (CHLOR-TRIMETON) 4 MG tablet Take by mouth. 4 times daily    . cholecalciferol (VITAMIN D3) 25 MCG (1000 UNIT) tablet Take 1,000 Units by mouth daily.    Marland Kitchen Dextromethorphan Polistirex (DELSYM PO) Take by mouth daily.    Marland Kitchen diltiazem (CARDIZEM CD) 240 MG 24 hr capsule Take 1 capsule (240 mg total) by mouth daily. 90 capsule 1  . famotidine (PEPCID) 20 MG tablet One  after supper 30 tablet 11  . Fluticasone-Salmeterol (ADVAIR DISKUS) 500-50 MCG/DOSE AEPB     . gabapentin (NEURONTIN) 300 MG capsule TAKE 1 CAPSULE (300 MG TOTAL) BY MOUTH 4 (FOUR) TIMES DAILY.    Marland Kitchen ipratropium (ATROVENT) 0.02 % nebulizer solution Inhale into the lungs.    . pantoprazole (PROTONIX) 40 MG tablet Take 1 tablet (40 mg total) by mouth daily. Take 30-60 min before first meal of the day 30 tablet 2  . Respiratory Therapy Supplies (FLUTTER) DEVI Use as directed 1 each 0  . sertraline (ZOLOFT) 25 MG tablet Take by mouth. (Patient not taking: Reported on 09/10/2019)     No current facility-administered medications for this visit.    Family History Family History  Problem Relation Age of Onset  . Skin cancer Mother        melanoma  . Hypertension Mother        also had polio  . Irritable bowel syndrome Mother   . Breast cancer Mother 63  . Coronary artery disease Father        age 68's  . Hypertension Father   . Breast cancer Maternal Grandmother 78  . Stroke Maternal Grandmother   . Other Son        hyperinsulinemic  . Coronary artery disease Paternal Uncle        age 13's  . Skin cancer Sister 37       melanoma - cancer in eye and uterus  . Thyroid disease Sister        hashimoto thyroiditis  . Breast cancer Other        pat. great aunt  . Irritable bowel syndrome Daughter   . Diabetes Maternal Grandfather   . Heart disease Maternal Grandfather   . Diabetes Paternal Uncle   . Epilepsy Son   . Breast cancer Cousin        second cousin  . Pulmonary embolism Other        died from this after MVA  . Heart disease Paternal Grandmother   . Heart disease Paternal Grandfather   . Colon cancer Neg Hx       Social History Social History   Tobacco Use  . Smoking status: Former Smoker    Packs/day: 0.10    Years: 5.00    Pack years: 0.50    Types: Cigarettes    Quit date: 02/21/2007    Years since quitting: 12.5  . Smokeless tobacco: Never Used  . Tobacco  comment: social smoker  Vaping Use  . Vaping Use: Never used  Substance Use Topics  . Alcohol use: Yes    Alcohol/week: 0.0 standard drinks    Comment: occas  . Drug use: No        Review of Systems  Constitutional: Negative.  Negative for fever.  HENT: Negative for sore  throat (Her voice continues to improve gradually, but remains hoarse.).   Respiratory: Positive for cough, shortness of breath (All symptoms following her Covid illness in August 2020.) and wheezing. Negative for stridor.   Cardiovascular: Positive for palpitations.  Gastrointestinal: Negative.   Genitourinary: Negative.   Skin: Negative for itching and rash.  Neurological: Negative.   Psychiatric/Behavioral: Negative.       Physical Exam Blood pressure 127/86, pulse (!) 109, temperature 99 F (37.2 C), temperature source Oral, height 5\' 5"  (1.651 m), weight 206 lb (93.4 kg), last menstrual period 01/27/2000, SpO2 98 %. Last Weight  Most recent update: 09/10/2019  9:13 AM   Weight  93.4 kg (206 lb)            CONSTITUTIONAL: Well developed, and nourished, appropriately responsive and aware without distress.   EYES: Sclera non-icteric.   EARS, NOSE, MOUTH AND THROAT: Mask worn.  Voice is notably strained.  Hearing is intact to voice.  NECK: Trachea is midline, and there is no jugular venous distension.  LYMPH NODES:  Lymph nodes in the neck are not enlarged. RESPIRATORY:  Lungs are clear, and breath sounds are equal bilaterally. Normal respiratory effort without pathologic use of accessory muscles. CARDIOVASCULAR: Heart is regular in rate and rhythm. GI: The abdomen is soft, nontender, and nondistended. There were no palpable masses. I did not appreciate hepatosplenomegaly. There were normal bowel sounds. GU: Breast exam, no notable asymmetry, skin changes, dimpling.  She has a medial scar on her right breast and a upper medial scar on her left breast.  Right breast was due to a biopsy, the left upper breast  was due to a loop monitor for her heart.  There is a slight increase in density of her left breast tissue, but otherwise no suspicious nor dominant masses or nodularity in either breast. MUSCULOSKELETAL:  Symmetrical muscle tone appreciated in all four extremities.    SKIN: Skin turgor is normal. No pathologic skin lesions appreciated.  NEUROLOGIC:  Motor and sensation appear grossly normal.  Cranial nerves are grossly without defect. PSYCH:  Alert and oriented to person, place and time. Affect is appropriate for situation.  Data Reviewed I have personally reviewed what is currently available of the patient's imaging, recent labs and medical records.   Labs:  CBC Latest Ref Rng & Units 03/17/2019 01/09/2019 12/01/2018  WBC 4.0 - 10.5 K/uL 8.6 9.1 5.4  Hemoglobin 12.0 - 15.0 g/dL 13.9 14.4 14.4  Hematocrit 36 - 46 % 39.8 41.4 42.0  Platelets 150 - 400 K/uL 231.0 230.0 243.0   CMP Latest Ref Rng & Units 01/15/2019 12/01/2018 10/24/2018  Glucose 70 - 99 mg/dL 94 99 77  BUN 6 - 23 mg/dL 12 15 16   Creatinine 0.40 - 1.20 mg/dL 1.02 0.95 1.04  Sodium 135 - 145 mEq/L 140 139 146  Potassium 3.5 - 5.1 mEq/L 4.5 3.9 4.6  Chloride 96 - 112 mEq/L 104 104 109  CO2 19 - 32 mEq/L 30 28 24   Calcium 8.4 - 10.5 mg/dL 9.6 9.5 10.7(H)  Total Protein 6.0 - 8.3 g/dL 7.1 - 7.1  Total Bilirubin 0.2 - 1.2 mg/dL 0.7 - 0.5  Alkaline Phos 39 - 117 U/L 81 - -  AST 0 - 37 U/L 16 - 26  ALT 0 - 35 U/L 21 - 38(H)      Imaging: Radiology review: CLINICAL DATA:  Screening.  EXAM: DIGITAL SCREENING BILATERAL MAMMOGRAM WITH TOMO AND CAD  COMPARISON:  Previous exam(s).  ACR Breast Density  Category c: The breast tissue is heterogeneously dense, which may obscure small masses.  FINDINGS: In the left breast, a possible asymmetry warrants further evaluation. In the right breast, no findings suspicious for malignancy. Images were processed with CAD.  IMPRESSION: Further evaluation is suggested for possible  asymmetry in the left breast.  RECOMMENDATION: Diagnostic mammogram and possibly ultrasound of the left breast. (Code:FI-L-23M)  The patient will be contacted regarding the findings, and additional imaging will be scheduled.  BI-RADS CATEGORY  0: Incomplete. Need additional imaging evaluation and/or prior mammograms for comparison.   Electronically Signed   By: Lovey Newcomer M.D.   On: 09/08/2019 16:50  Within last 24 hrs: No results found.  Assessment    Incomplete assessment of possible asymmetry on screening left breast mammography. Patient Active Problem List   Diagnosis Date Noted  . Brain fog 08/21/2019  . Hoarseness of voice 08/21/2019  . Abnormal findings on diagnostic imaging of lung 08/21/2019  . DOE (dyspnea on exertion) 04/16/2019  . Precordial chest pain 01/22/2019  . Palpitations 01/22/2019  . Tachycardia 01/15/2019  . Low TSH level 01/15/2019  . Hair loss 01/06/2019  . Upper airway cough syndrome 11/21/2018  . Urinary frequency 10/24/2018  . History of COVID-19 09/09/2018  . Frontal headache 07/14/2018  . Vitamin D deficiency 12/12/2017  . Floaters, bilateral 11/08/2017  . Pre-syncope 11/08/2017  . Stressful life events affecting family and household 11/08/2017  . Chest pain at rest 02/09/2016  . Extrinsic asthma 01/20/2016  . Labral tear of shoulder 02/01/2014  . Bowel habit changes 04/28/2013  . Obesity, Class I, BMI 30-34.9 01/09/2013  . Diffuse cystic mastopathy 08/18/2012  . Family history of breast cancer 08/18/2012  . Healthcare maintenance 02/27/2011  . Family history of melanoma 02/27/2011  . SYNCOPE, HX OF 08/11/2009  . BREAST MASS, HX OF 08/11/2009    Plan    Complete radiologic evaluation, likely magnified/special imaging, possible ultrasound follow-up. As there is nothing on clinical exam to warrant intervention/biopsy, will anticipate follow-up in 6 months for exam/likely 1 year for repeat imaging.  That timing assessment  pending. She was advised to call radiology if they have not completed scheduling/planning for the additional imaging planned.  I will attempt to be mindful of this additional imaging. Face-to-face time spent with the patient and accompanying care providers(if present) was 30 minutes, with more than 50% of the time spent counseling, educating, and coordinating care of the patient.      Ronny Bacon M.D., FACS 09/10/2019, 9:32 AM

## 2019-09-10 NOTE — Progress Notes (Signed)
Pulmonary Individual Treatment Plan  Patient Details  Name: ARIANI SEIER MRN: [id number] Date of Birth: [birth year] Referring Provider:     Pulmonary Rehab from 09/10/2019 in [facility] Cardiac and Pulmonary Rehab  Referring Provider Marshell Garfinkel MD      Initial Encounter Date:    Pulmonary Rehab from 09/10/2019 in [facility] Cardiac and Pulmonary Rehab  Date 09/10/19      Visit Diagnosis: DOE (dyspnea on exertion)  Patient's Home Medications on Admission:  Current Outpatient Medications:  .  albuterol (PROVENTIL) (2.5 MG/3ML) 0.083% nebulizer solution, Take 3 mLs (2.5 mg total) by nebulization every 6 (six) hours as needed for wheezing or shortness of breath., Disp: 150 mL, Rfl: 0 .  amitriptyline (ELAVIL) 50 MG tablet, Take 50 mg by mouth at bedtime., Disp: , Rfl:  .  Biotin 5 MG CAPS, Take 1 capsule (5 mg total) by mouth daily., Disp: , Rfl: 0 .  chlorpheniramine (CHLOR-TRIMETON) 4 MG tablet, Take by mouth. 4 times daily, Disp: , Rfl:  .  cholecalciferol (VITAMIN D3) 25 MCG (1000 UNIT) tablet, Take 1,000 Units by mouth daily., Disp: , Rfl:  .  Dextromethorphan Polistirex (DELSYM PO), Take by mouth daily., Disp: , Rfl:  .  diltiazem (CARDIZEM CD) 240 MG 24 hr capsule, Take 1 capsule (240 mg total) by mouth daily., Disp: 90 capsule, Rfl: 1 .  famotidine (PEPCID) 20 MG tablet, One after supper, Disp: 30 tablet, Rfl: 11 .  Fluticasone-Salmeterol (ADVAIR DISKUS) 500-50 MCG/DOSE AEPB, , Disp: , Rfl:  .  gabapentin (NEURONTIN) 300 MG capsule, TAKE 1 CAPSULE (300 MG TOTAL) BY MOUTH 4 (FOUR) TIMES DAILY., Disp: , Rfl:  .  ipratropium (ATROVENT) 0.02 % nebulizer solution, Inhale into the lungs., Disp: , Rfl:  .  pantoprazole (PROTONIX) 40 MG tablet, Take 1 tablet (40 mg total) by mouth daily. Take 30-60 min before first meal of the day, Disp: 30 tablet, Rfl: 2 .  Respiratory Therapy Supplies (FLUTTER) DEVI, Use as directed, Disp: 1 each, Rfl: 0 .  sertraline (ZOLOFT) 25 MG tablet, Take by mouth.  (Patient not taking: Reported on 09/10/2019), Disp: , Rfl:   Past Medical History: Past Medical History:  Diagnosis Date  . Allergy   . Asthma   . Cardiac disease   . Diffuse cystic mastopathy   . GERD (gastroesophageal reflux disease)   . Guttate psoriasis   . H/O syncope    cards work-up WNL (Hochrein) thought 2/2 overmedication vs arrhythmias  . Hx of endometriosis    hysterectomy  . Hx of migraines    occasional; improved since hysterectomy  . Labral tear of shoulder 02/2014   R, pending surgery Tamera Punt)  . Rosacea    mild    Tobacco Use: Social History   Tobacco Use  Smoking Status Former Smoker  . Packs/day: 0.10  . Years: 5.00  . Pack years: 0.50  . Types: Cigarettes  . Quit date: 02/21/2007  . Years since quitting: 12.5  Smokeless Tobacco Never Used  Tobacco Comment   social smoker    Labs: Recent Review Flowsheet Data    Labs for ITP Cardiac and Pulmonary Rehab Latest Ref Rng & Units 08/12/2009 02/23/2011 02/16/2016 12/13/2017   Cholestrol 0 - 200 mg/dL 168 160 158 174   LDLCALC 0 - 99 mg/dL 89 88 82 94   HDL >39.00 mg/dL 41.60 50.60 50 59.10   Trlycerides 0 - 149 mg/dL 187.0(H) 106.0 128 108.0       Pulmonary Assessment Scores:  Pulmonary Assessment  Scores    Row Name 09/03/19 1051 09/10/19 1024       ADL UCSD   ADL Phase Entry Entry    SOB Score total 57 57    Rest 1 1    Walk 2 2    Stairs 4 4    Bath 2 2    Dress 2 2    Shop 3 3      CAT Score   CAT Score 25 25      mMRC Score   mMRC Score -- 3           UCSD: Self-administered rating of dyspnea associated with activities of daily living (ADLs) 6-point scale (0 = "not at all" to 5 = "maximal or unable to do because of breathlessness")  Scoring Scores range from 0 to 120.  Minimally important difference is 5 units  CAT: CAT can identify the health impairment of COPD patients and is better correlated with disease progression.  CAT has a scoring range of zero to 40. The CAT score  is classified into four groups of low (less than 10), medium (10 - 20), high (21-30) and very high (31-40) based on the impact level of disease on health status. A CAT score over 10 suggests significant symptoms.  A worsening CAT score could be explained by an exacerbation, poor medication adherence, poor inhaler technique, or progression of COPD or comorbid conditions.  CAT MCID is 2 points  mMRC: mMRC (Modified Medical Research Council) Dyspnea Scale is used to assess the degree of baseline functional disability in patients of respiratory disease due to dyspnea. No minimal important difference is established. A decrease in score of 1 point or greater is considered a positive change.   Pulmonary Function Assessment:   Exercise Target Goals: Exercise Program Goal: Individual exercise prescription set using results from initial 6 min walk test and THRR while considering  patient's activity barriers and safety.   Exercise Prescription Goal: Initial exercise prescription builds to 30-45 minutes a day of aerobic activity, 2-3 days per week.  Home exercise guidelines will be given to patient during program as part of exercise prescription that the participant will acknowledge.  Education: Aerobic Exercise & Resistance Training: - Gives group verbal and written instruction on the various components of exercise. Focuses on aerobic and resistive training programs and the benefits of this training and how to safely progress through these programs..   Education: Exercise & Equipment Safety: - Individual verbal instruction and demonstration of equipment use and safety with use of the equipment.   Pulmonary Rehab from 09/10/2019 in [facility] Cardiac and Pulmonary Rehab  Date 09/10/19  Educator [employer]  Instruction Review Code 1- Verbalizes Understanding      Education: Exercise Physiology & General Exercise Guidelines: - Group verbal and written instruction with models to review the exercise physiology of the  cardiovascular system and associated critical values. Provides general exercise guidelines with specific guidelines to those with heart or lung disease.    Education: Flexibility, Balance, Mind/Body Relaxation: Provides group verbal/written instruction on the benefits of flexibility and balance training, including mind/body exercise modes such as yoga, pilates and tai chi.  Demonstration and skill practice provided.   Activity Barriers & Risk Stratification:  Activity Barriers & Cardiac Risk Stratification - 09/10/19 0915      Activity Barriers & Cardiac Risk Stratification   Activity Barriers Deconditioning;Shortness of Breath;History of Falls;Muscular Weakness           6 Minute Walk:  6  Minute Walk    Row Name 09/10/19 0913         6 Minute Walk   Phase Initial     Distance 1163 feet     Walk Time 6 minutes     # of Rest Breaks 0     MPH 2.2     METS 3.96     RPE 14     Perceived Dyspnea  2     VO2 Peak 13.86     Symptoms Yes (comment)     Comments SOB, coughing     Resting HR 109 bpm     Resting BP 118/56     Resting Oxygen Saturation  97 %     Exercise Oxygen Saturation  during 6 min walk 95 %     Max Ex. HR 125 bpm     Max Ex. BP 144/74     2 Minute Post BP 132/70       Interval HR   1 Minute HR 123     2 Minute HR 120     3 Minute HR 118     4 Minute HR 116     5 Minute HR 118     6 Minute HR 125     2 Minute Post HR 111     Interval Heart Rate? Yes       Interval Oxygen   Interval Oxygen? Yes     Baseline Oxygen Saturation % 97 %     1 Minute Oxygen Saturation % 96 %     1 Minute Liters of Oxygen 0 L  Room Air     2 Minute Oxygen Saturation % 95 %     2 Minute Liters of Oxygen 0 L     3 Minute Oxygen Saturation % 95 %     3 Minute Liters of Oxygen 0 L     4 Minute Oxygen Saturation % 96 %     4 Minute Liters of Oxygen 0 L     5 Minute Oxygen Saturation % 96 %     5 Minute Liters of Oxygen 0 L     6 Minute Oxygen Saturation % 96 %     6 Minute  Liters of Oxygen 0 L     2 Minute Post Oxygen Saturation % 96 %     2 Minute Post Liters of Oxygen 0 L           Oxygen Initial Assessment:  Oxygen Initial Assessment - 09/03/19 1051      Home Oxygen   Home Oxygen Device None    Sleep Oxygen Prescription None    Home Exercise Oxygen Prescription None    Home at Rest Exercise Oxygen Prescription None      Initial 6 min Walk   Oxygen Used None      Program Oxygen Prescription   Program Oxygen Prescription None      Intervention   Short Term Goals To learn and understand importance of monitoring SPO2 with pulse oximeter and demonstrate accurate use of the pulse oximeter.;To learn and understand importance of maintaining oxygen saturations>88%;To learn and demonstrate proper pursed lip breathing techniques or other breathing techniques.;To learn and demonstrate proper use of respiratory medications    Long  Term Goals Verbalizes importance of monitoring SPO2 with pulse oximeter and return demonstration;Maintenance of O2 saturations>88%;Exhibits proper breathing techniques, such as pursed lip breathing or other method taught during program session;Compliance with respiratory medication;Demonstrates proper use of  MDI's           Oxygen Re-Evaluation:   Oxygen Discharge (Final Oxygen Re-Evaluation):   Initial Exercise Prescription:  Initial Exercise Prescription - 09/10/19 0900      Date of Initial Exercise RX and Referring Provider   Date 09/10/19    Referring Provider Marshell Garfinkel MD      Treadmill   MPH 2.2    Grade 1    Minutes 15    METs 2.99      REL-XR   Level 1    Speed 50    Minutes 15    METs 2      T5 Nustep   Level 2    SPM 80    Minutes 15    METs 2      Prescription Details   Frequency (times per week) 3    Duration Progress to 30 minutes of continuous aerobic without signs/symptoms of physical distress      Intensity   THRR 40-80% of Max Heartrate 135-161    Ratings of Perceived Exertion  11-13    Perceived Dyspnea 0-4      Progression   Progression Continue to progress workloads to maintain intensity without signs/symptoms of physical distress.      Resistance Training   Training Prescription Yes    Weight 3 lb    Reps 10-15           Perform Capillary Blood Glucose checks as needed.  Exercise Prescription Changes:  Exercise Prescription Changes    Row Name 09/10/19 0900             Response to Exercise   Blood Pressure (Admit) 118/56       Blood Pressure (Exercise) 144/74       Blood Pressure (Exit) 124/70       Heart Rate (Admit) 109 bpm       Heart Rate (Exercise) 125 bpm       Heart Rate (Exit) 114 bpm       Oxygen Saturation (Admit) 97 %       Oxygen Saturation (Exercise) 95 %       Oxygen Saturation (Exit) 96 %       Rating of Perceived Exertion (Exercise) 14       Perceived Dyspnea (Exercise) 2       Symptoms SOB, coughing       Comments walk test results              Exercise Comments:   Exercise Goals and Review:  Exercise Goals    Row Name 09/10/19 0916             Exercise Goals   Increase Physical Activity Yes       Intervention Provide advice, education, support and counseling about physical activity/exercise needs.;Develop an individualized exercise prescription for aerobic and resistive training based on initial evaluation findings, risk stratification, comorbidities and participant's personal goals.       Expected Outcomes Short Term: Attend rehab on a regular basis to increase amount of physical activity.;Long Term: Add in home exercise to make exercise part of routine and to increase amount of physical activity.;Long Term: Exercising regularly at least 3-5 days a week.       Increase Strength and Stamina Yes       Intervention Provide advice, education, support and counseling about physical activity/exercise needs.;Develop an individualized exercise prescription for aerobic and resistive training based on initial  evaluation findings, risk stratification, comorbidities  and participant's personal goals.       Expected Outcomes Short Term: Increase workloads from initial exercise prescription for resistance, speed, and METs.;Short Term: Perform resistance training exercises routinely during rehab and add in resistance training at home;Long Term: Improve cardiorespiratory fitness, muscular endurance and strength as measured by increased METs and functional capacity (6MWT)       Able to understand and use rate of perceived exertion (RPE) scale Yes       Intervention Provide education and explanation on how to use RPE scale       Expected Outcomes Short Term: Able to use RPE daily in rehab to express subjective intensity level;Long Term:  Able to use RPE to guide intensity level when exercising independently       Able to understand and use Dyspnea scale Yes       Intervention Provide education and explanation on how to use Dyspnea scale       Expected Outcomes Short Term: Able to use Dyspnea scale daily in rehab to express subjective sense of shortness of breath during exertion;Long Term: Able to use Dyspnea scale to guide intensity level when exercising independently       Knowledge and understanding of Target Heart Rate Range (THRR) Yes       Intervention Provide education and explanation of THRR including how the numbers were predicted and where they are located for reference       Expected Outcomes Short Term: Able to state/look up THRR;Short Term: Able to use daily as guideline for intensity in rehab;Long Term: Able to use THRR to govern intensity when exercising independently       Able to check pulse independently Yes       Intervention Provide education and demonstration on how to check pulse in carotid and radial arteries.;Review the importance of being able to check your own pulse for safety during independent exercise       Expected Outcomes Short Term: Able to explain why pulse checking is important  during independent exercise;Long Term: Able to check pulse independently and accurately       Understanding of Exercise Prescription Yes       Intervention Provide education, explanation, and written materials on patient's individual exercise prescription       Expected Outcomes Short Term: Able to explain program exercise prescription;Long Term: Able to explain home exercise prescription to exercise independently              Exercise Goals Re-Evaluation :   Discharge Exercise Prescription (Final Exercise Prescription Changes):  Exercise Prescription Changes - 09/10/19 0900      Response to Exercise   Blood Pressure (Admit) 118/56    Blood Pressure (Exercise) 144/74    Blood Pressure (Exit) 124/70    Heart Rate (Admit) 109 bpm    Heart Rate (Exercise) 125 bpm    Heart Rate (Exit) 114 bpm    Oxygen Saturation (Admit) 97 %    Oxygen Saturation (Exercise) 95 %    Oxygen Saturation (Exit) 96 %    Rating of Perceived Exertion (Exercise) 14    Perceived Dyspnea (Exercise) 2    Symptoms SOB, coughing    Comments walk test results           Nutrition:  Target Goals: Understanding of nutrition guidelines, daily intake of sodium <1580m, cholesterol <2029m calories 30% from fat and 7% or less from saturated fats, daily to have 5 or more servings of fruits and vegetables.  Education: Controlling Sodium/Reading  Food Labels -Group verbal and written material supporting the discussion of sodium use in heart healthy nutrition. Review and explanation with models, verbal and written materials for utilization of the food label.   Education: General Nutrition Guidelines/Fats and Fiber: -Group instruction provided by verbal, written material, models and posters to present the general guidelines for heart healthy nutrition. Gives an explanation and review of dietary fats and fiber.   Biometrics:  Pre Biometrics - 09/10/19 0918      Pre Biometrics   Height 5' 5.6" (1.666 m)    Weight  206 lb 4.8 oz (93.6 kg)    BMI (Calculated) 33.71    Single Leg Stand 14 seconds            Nutrition Therapy Plan and Nutrition Goals:   Nutrition Assessments:  Nutrition Assessments - 09/03/19 1050      MEDFICTS Scores   Pre Score 21           MEDIFICTS Score Key:          ?70 Need to make dietary changes          40-70 Heart Healthy Diet         ? 40 Therapeutic Level Cholesterol Diet  Nutrition Goals Re-Evaluation:   Nutrition Goals Discharge (Final Nutrition Goals Re-Evaluation):   Psychosocial: Target Goals: Acknowledge presence or absence of significant depression and/or stress, maximize coping skills, provide positive support system. Participant is able to verbalize types and ability to use techniques and skills needed for reducing stress and depression.   Education: Depression - Provides group verbal and written instruction on the correlation between heart/lung disease and depressed mood, treatment options, and the stigmas associated with seeking treatment.   Education: Sleep Hygiene -Provides group verbal and written instruction about how sleep can affect your health.  Define sleep hygiene, discuss sleep cycles and impact of sleep habits. Review good sleep hygiene tips.    Education: Stress and Anxiety: - Provides group verbal and written instruction about the health risks of elevated stress and causes of high stress.  Discuss the correlation between heart/lung disease and anxiety and treatment options. Review healthy ways to manage with stress and anxiety.   Initial Review & Psychosocial Screening:  Initial Psych Review & Screening - 09/03/19 1043      Initial Review   Current issues with Current Anxiety/Panic;Current Stress Concerns;Current Sleep Concerns    Source of Stress Concerns Chronic Illness;Family;Unable to participate in former interests or hobbies;Unable to perform yard/household activities    Comments Frustration form not being able to go  and do from dealing with COVID recovery over last year, meds help with sleep and cough, mother just diagnosised with breast cancer this month      Victor? Yes   husband, mother and father, kids (daughter just moved to [place])     Barriers   Psychosocial barriers to participate in program There are no identifiable barriers or psychosocial needs.;The patient should benefit from training in stress management and relaxation.;Psychosocial barriers identified (see note)      Screening Interventions   Interventions Encouraged to exercise;Provide feedback about the scores to participant;To provide support and resources with identified psychosocial needs    Expected Outcomes Short Term goal: Utilizing psychosocial counselor, staff and physician to assist with identification of specific Stressors or current issues interfering with healing process. Setting desired goal for each stressor or current issue identified.;Long Term Goal: Stressors or current issues are controlled or eliminated.;Short  Term goal: Identification and review with participant of any Quality of Life or Depression concerns found by scoring the questionnaire.;Long Term goal: The participant improves quality of Life and PHQ9 Scores as seen by post scores and/or verbalization of changes           Quality of Life Scores:  Scores of 19 and below usually indicate a poorer quality of life in these areas.  A difference of  2-3 points is a clinically meaningful difference.  A difference of 2-3 points in the total score of the Quality of Life Index has been associated with significant improvement in overall quality of life, self-image, physical symptoms, and general health in studies assessing change in quality of life.  PHQ-9: Recent Review Flowsheet Data    Depression screen [facility] 2/9 09/10/2019 01/05/2019 12/20/2017   Decreased Interest 1 0 2   Down, Depressed, Hopeless 0 0 1   PHQ - 2 Score 1 0 3   Altered  sleeping 1 - 2   Tired, decreased energy 2 - 2   Change in appetite 0 - 0   Feeling bad or failure about yourself  0 - 0   Trouble concentrating 0 - 0   Moving slowly or fidgety/restless 0 - 0   Suicidal thoughts 0 - 0   PHQ-9 Score 4 - 7   Difficult doing work/chores Somewhat difficult - -     Interpretation of Total Score  Total Score Depression Severity:  1-4 = Minimal depression, 5-9 = Mild depression, 10-14 = Moderate depression, 15-19 = Moderately severe depression, 20-27 = Severe depression   Psychosocial Evaluation and Intervention:  Psychosocial Evaluation - 09/03/19 1052      Psychosocial Evaluation & Interventions   Interventions Stress management education;Encouraged to exercise with the program and follow exercise prescription    Comments 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  She was diganoised a year ago and is still recovering.  She still is having extreme SOB and hoarseness, and extreme fatigue.  She wants to be able to go into work and get out of the house to be able to do more and breathe better.  She is originally from [place] and went to [place] for school. Her husband works for school and her family is now in [place], [place].  She continues to struggle with some brain fog but it is starting to clear.  This has all really been frustrating for her as she is used to being able to just go and do.  She is hoping to be able to come to class after her work day so that she can continue to work as much as possible.  Her family has also just found out that her mom has breast cancer.  She is looking forward to feeling better.    Expected Outcomes Short: Attend rehab to improve stamina and SOB  Long: Continue to stay positive and    Continue Psychosocial Services  Follow up required by staff           Psychosocial Re-Evaluation:   Psychosocial Discharge (Final Psychosocial Re-Evaluation):   Education: Education Goals: Education classes will be  provided on a weekly basis, covering required topics. Participant will state understanding/return demonstration of topics presented.  Learning Barriers/Preferences:  Learning Barriers/Preferences - 09/03/19 1042      Learning Barriers/Preferences   Learning Barriers Sight   glasses to drive   Learning Preferences Skilled Demonstration  General Pulmonary Education Topics:  Infection Prevention: - Provides verbal and written material to individual with discussion of infection control including proper hand washing and proper equipment cleaning during exercise session.   Pulmonary Rehab from 09/10/2019 in [facility] Cardiac and Pulmonary Rehab  Date 09/10/19  Educator [facility]  Instruction Review Code 1- Verbalizes Understanding      Falls Prevention: - Provides verbal and written material to individual with discussion of falls prevention and safety.   Pulmonary Rehab from 09/10/2019 in [facility] Cardiac and Pulmonary Rehab  Date 09/10/19  Educator [facility]  Instruction Review Code 1- Verbalizes Understanding      Chronic Lung Diseases: - Group verbal and written instruction to review updates, respiratory medications, advancements in procedures and treatments. Discuss use of supplemental oxygen including available portable oxygen systems, continuous and intermittent flow rates, concentrators, personal use and safety guidelines. Review proper use of inhaler and spacers. Provide informative websites for self-education.    Pulmonary Rehab from 09/10/2019 in [facility] Cardiac and Pulmonary Rehab  Date 09/10/19  Instruction Review Code 3- Needs Reinforcement  [need identified]      Energy Conservation: - Provide group verbal and written instruction for methods to conserve energy, plan and organize activities. Instruct on pacing techniques, use of adaptive equipment and posture/positioning to relieve shortness of breath.   Triggers and Exacerbations: - Group verbal and written instruction to review types  of environmental triggers and ways to prevent exacerbations. Discuss weather changes, air quality and the benefits of nasal washing. Review warning signs and symptoms to help prevent infections. Discuss techniques for effective airway clearance, coughing, and vibrations.   AED/CPR: - Group verbal and written instruction with the use of models to demonstrate the basic use of the AED with the basic ABC's of resuscitation.   Anatomy and Physiology of the Lungs: - Group verbal and written instruction with the use of models to provide basic lung anatomy and physiology related to function, structure and complications of lung disease.   Anatomy & Physiology of the Heart: - Group verbal and written instruction and models provide basic cardiac anatomy and physiology, with the coronary electrical and arterial systems. Review of Valvular disease and Heart Failure   Cardiac Medications: - Group verbal and written instruction to review commonly prescribed medications for heart disease. Reviews the medication, class of the drug, and side effects.   Other: -Provides group and verbal instruction on various topics (see comments)   Knowledge Questionnaire Score:    Core Components/Risk Factors/Patient Goals at Admission:  Personal Goals and Risk Factors at Admission - 09/10/19 0918      Core Components/Risk Factors/Patient Goals on Admission    Weight Management Yes;Obesity;Weight Loss    Intervention Weight Management: Develop a combined nutrition and exercise program designed to reach desired caloric intake, while maintaining appropriate intake of nutrient and fiber, sodium and fats, and appropriate energy expenditure required for the weight goal.;Weight Management: Provide education and appropriate resources to help participant work on and attain dietary goals.;Weight Management/Obesity: Establish reasonable short term and long term weight goals.;Obesity: Provide education and appropriate resources  to help participant work on and attain dietary goals.    Admit Weight 206 lb 4.8 oz (93.6 kg)    Goal Weight: Short Term 200 lb (90.7 kg)    Goal Weight: Long Term 195 lb (88.5 kg)    Expected Outcomes Short Term: Continue to assess and modify interventions until short term weight is achieved;Long Term: Adherence to nutrition and physical activity/exercise program aimed toward  attainment of established weight goal;Weight Loss: Understanding of general recommendations for a balanced deficit meal plan, which promotes 1-2 lb weight loss per week and includes a negative energy balance of [phone] kcal/d;Understanding recommendations for meals to include 15-35% energy as protein, 25-35% energy from fat, 35-60% energy from carbohydrates, less than 218m of dietary cholesterol, 20-35 gm of total fiber daily;Understanding of distribution of calorie intake throughout the day with the consumption of 4-5 meals/snacks    Improve shortness of breath with ADL's Yes    Intervention Provide education, individualized exercise plan and daily activity instruction to help decrease symptoms of SOB with activities of daily living.    Expected Outcomes Short Term: Improve cardiorespiratory fitness to achieve a reduction of symptoms when performing ADLs;Long Term: Be able to perform more ADLs without symptoms or delay the onset of symptoms           Education:Diabetes - Individual verbal and written instruction to review signs/symptoms of diabetes, desired ranges of glucose level fasting, after meals and with exercise. Acknowledge that pre and post exercise glucose checks will be done for 3 sessions at entry of program.   Education: Know Your Numbers and Risk Factors: -Group verbal and written instruction about important numbers in your health.  Discussion of what are risk factors and how they play a role in the disease process.  Review of Cholesterol, Blood Pressure, Diabetes, and BMI and the role they play in your  overall health.   Core Components/Risk Factors/Patient Goals Review:    Core Components/Risk Factors/Patient Goals at Discharge (Final Review):    ITP Comments:  ITP Comments    Row Name 09/03/19 1059 09/10/19 0913         ITP Comments Completed virtual orientation today.  EP evaluation is scheduled for Thurs 9/9 at 730am.  [facility] Completed 6MWT and gym orientation. Initial ITP created and sent for review to Dr. MEmily Filbert Medical Director.             Comments: Initial ITP

## 2019-09-10 NOTE — Patient Instructions (Signed)
Patient Instructions  Patient Details  Name: Cassie [name] MRN: [id number] Date of Birth: 03-04-1970 Referring Provider:  Marshell Garfinkel, MD  Below are your personal goals for exercise, nutrition, and risk factors. Our goal is to help you stay on track towards obtaining and maintaining these goals. We will be discussing your progress on these goals with you throughout the program.  Initial Exercise Prescription:  Initial Exercise Prescription - 09/10/19 0900      Date of Initial Exercise RX and Referring Provider   Date 09/10/19    Referring Provider Marshell Garfinkel MD      Treadmill   MPH 2.2    Grade 1    Minutes 15    METs 2.99      REL-XR   Level 1    Speed 50    Minutes 15    METs 2      T5 Nustep   Level 2    SPM 80    Minutes 15    METs 2      Prescription Details   Frequency (times per week) 3    Duration Progress to 30 minutes of continuous aerobic without signs/symptoms of physical distress      Intensity   THRR 40-80% of Max Heartrate 135-161    Ratings of Perceived Exertion 11-13    Perceived Dyspnea 0-4      Progression   Progression Continue to progress workloads to maintain intensity without signs/symptoms of physical distress.      Resistance Training   Training Prescription Yes    Weight 3 lb    Reps 10-15           Exercise Goals: Frequency: Be able to perform aerobic exercise two to three times per week in program working toward 2-5 days per week of home exercise.  Intensity: Work with a perceived exertion of 11 (fairly light) - 15 (hard) while following your exercise prescription.  We will make changes to your prescription with you as you progress through the program.   Duration: Be able to do 30 to 45 minutes of continuous aerobic exercise in addition to a 5 minute warm-up and a 5 minute cool-down routine.   Nutrition Goals: Your personal nutrition goals will be established when you do your nutrition analysis with the  dietician.  The following are general nutrition guidelines to follow: Cholesterol < 200mg /day Sodium < 1500mg /day Fiber: Women under 50 yrs - 25 grams per day  Personal Goals:  Personal Goals and Risk Factors at Admission - 09/10/19 0918      Core Components/Risk Factors/Patient Goals on Admission    Weight Management Yes;Obesity;Weight Loss    Intervention Weight Management: Develop a combined nutrition and exercise program designed to reach desired caloric intake, while maintaining appropriate intake of nutrient and fiber, sodium and fats, and appropriate energy expenditure required for the weight goal.;Weight Management: Provide education and appropriate resources to help participant work on and attain dietary goals.;Weight Management/Obesity: Establish reasonable short term and long term weight goals.;Obesity: Provide education and appropriate resources to help participant work on and attain dietary goals.    Admit Weight 206 lb 4.8 oz (93.6 kg)    Goal Weight: Short Term 200 lb (90.7 kg)    Goal Weight: Long Term 195 lb (88.5 kg)    Expected Outcomes Short Term: Continue to assess and modify interventions until short term weight is achieved;Long Term: Adherence to nutrition and physical activity/exercise program aimed toward attainment of established weight goal;Weight Loss:  Understanding of general recommendations for a balanced deficit meal plan, which promotes 1-2 lb weight loss per week and includes a negative energy balance of [phone] kcal/d;Understanding recommendations for meals to include 15-35% energy as protein, 25-35% energy from fat, 35-60% energy from carbohydrates, less than 200mg  of dietary cholesterol, 20-35 gm of total fiber daily;Understanding of distribution of calorie intake throughout the day with the consumption of 4-5 meals/snacks    Improve shortness of breath with ADL's Yes    Intervention Provide education, individualized exercise plan and daily activity instruction  to help decrease symptoms of SOB with activities of daily living.    Expected Outcomes Short Term: Improve cardiorespiratory fitness to achieve a reduction of symptoms when performing ADLs;Long Term: Be able to perform more ADLs without symptoms or delay the onset of symptoms           Tobacco Use Initial Evaluation: Social History   Tobacco Use  Smoking Status Former Smoker  . Packs/day: 0.10  . Years: 5.00  . Pack years: 0.50  . Types: Cigarettes  . Quit date: 02/21/2007  . Years since quitting: 12.5  Smokeless Tobacco Never Used  Tobacco Comment   social smoker    Exercise Goals and Review:  Exercise Goals    Row Name 09/10/19 0916             Exercise Goals   Increase Physical Activity Yes       Intervention Provide advice, education, support and counseling about physical activity/exercise needs.;Develop an individualized exercise prescription for aerobic and resistive training based on initial evaluation findings, risk stratification, comorbidities and participant's personal goals.       Expected Outcomes Short Term: Attend rehab on a regular basis to increase amount of physical activity.;Long Term: Add in home exercise to make exercise part of routine and to increase amount of physical activity.;Long Term: Exercising regularly at least 3-5 days a week.       Increase Strength and Stamina Yes       Intervention Provide advice, education, support and counseling about physical activity/exercise needs.;Develop an individualized exercise prescription for aerobic and resistive training based on initial evaluation findings, risk stratification, comorbidities and participant's personal goals.       Expected Outcomes Short Term: Increase workloads from initial exercise prescription for resistance, speed, and METs.;Short Term: Perform resistance training exercises routinely during rehab and add in resistance training at home;Long Term: Improve cardiorespiratory fitness, muscular  endurance and strength as measured by increased METs and functional capacity (6MWT)       Able to understand and use rate of perceived exertion (RPE) scale Yes       Intervention Provide education and explanation on how to use RPE scale       Expected Outcomes Short Term: Able to use RPE daily in rehab to express subjective intensity level;Long Term:  Able to use RPE to guide intensity level when exercising independently       Able to understand and use Dyspnea scale Yes       Intervention Provide education and explanation on how to use Dyspnea scale       Expected Outcomes Short Term: Able to use Dyspnea scale daily in rehab to express subjective sense of shortness of breath during exertion;Long Term: Able to use Dyspnea scale to guide intensity level when exercising independently       Knowledge and understanding of Target Heart Rate Range (THRR) Yes       Intervention Provide education and explanation  of THRR including how the numbers were predicted and where they are located for reference       Expected Outcomes Short Term: Able to state/look up THRR;Short Term: Able to use daily as guideline for intensity in rehab;Long Term: Able to use THRR to govern intensity when exercising independently       Able to check pulse independently Yes       Intervention Provide education and demonstration on how to check pulse in carotid and radial arteries.;Review the importance of being able to check your own pulse for safety during independent exercise       Expected Outcomes Short Term: Able to explain why pulse checking is important during independent exercise;Long Term: Able to check pulse independently and accurately       Understanding of Exercise Prescription Yes       Intervention Provide education, explanation, and written materials on patient's individual exercise prescription       Expected Outcomes Short Term: Able to explain program exercise prescription;Long Term: Able to explain home exercise  prescription to exercise independently              Copy of goals given to participant.

## 2019-09-11 ENCOUNTER — Encounter: Payer: Self-pay | Admitting: Counselor

## 2019-09-11 ENCOUNTER — Ambulatory Visit: Payer: BC Managed Care – PPO | Admitting: Psychology

## 2019-09-11 ENCOUNTER — Ambulatory Visit (INDEPENDENT_AMBULATORY_CARE_PROVIDER_SITE_OTHER): Payer: BC Managed Care – PPO | Admitting: Counselor

## 2019-09-11 DIAGNOSIS — F09 Unspecified mental disorder due to known physiological condition: Secondary | ICD-10-CM

## 2019-09-11 DIAGNOSIS — Z8616 Personal history of COVID-19: Secondary | ICD-10-CM | POA: Diagnosis not present

## 2019-09-11 NOTE — Progress Notes (Signed)
[facility]  Patient Name: Cassie [name] MRN: [id number] Date of Birth: 01-09-1970 Age: 49 y.o. Education: 17 years  Referral Circumstances and Background Information  Cassie [name] is a 49 y.o., right-hand dominant, married woman with a history of COVID in August, 2020 and lingering symptoms including brain fog. She was referred by Wyn Quaker, NP with pulmonology for Neuropsychological evaluation in the service of clarifying her current cognitive status.     On interview, the patient reported that she was sick with COVID back in August, 2020 and was out of work for approximately 3 weeks. She was able to recover at home and did not require oxygen. After that, she started transitioning back to work gradually but was working from home. She eventually returned to work full time in person October although the physical demands were too much for her and by Thanksgiving, she decided that she could no longer work in person (she was staying in hotels, traveling frequently for her job). She then continued to work at home intermittently for a while before eventually taking a leave of absence from mid January until the end of March. She has since returned to work full time but is working at home only and some of her duties have been modified. 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 Initially after COVID, she was having a very hard time and was "in a fog." She has gotten better but continues to have fairly significant difficulties. She estimated that she is back to 80% of her cognitive baseline since then. She notices that there is a correlation between her physical status and her cognitive symptoms and when she is having a harder day physically, she is having a harder day mentally.   In terms of specific cognitive symptoms, Cassie [name] stated that she loses  attention very quickly, she is dependent on notes, she feels like her comprehension is not good when she is reading things or in conversations (she won't catch everything that was said). She also has some day-to-day forgetfulness. Her family have noticed her cognitive difficulties. She has an extremely hard time multitasking, although objectively it sounds as though she is asked to do very challenging multitasking such as talking on the phone while responding to e-mails. She feels like she is no longer good with directions. In addition to her cognitive symptoms, she has a number of ongoing physical issues. She continues with shortness of breath, hoarseness due to chronic vocal chord irritation, she feels as though she has no energy, and she has a persistent cough. She also has been having heart issues for which a definitive etiology has not been determined, her resting heart rate will be as high as 130 and her blood pressure will drop at times, causing two episodes of syncope in January, 2021. She stated that she likes her job and does not want disability although she does find herself making more mistakes and that bothers her. She does typically notice the mistakes that she makes and has started to modify her work flow to catch them.   Affectively, she stated that she is under a lot of stress and is "frustrated" but doesn't perceive herself to be depressed, although she became easily tearful as we discussed that. Her mother was just diagnosed with cancer and she also had an abnormal mammogram, so the past two weeks have been tough. She stated that  her energy level is low, she feels lethargic and tired many nights. She is taking amitryptaline and gabapentin to control her nocturnal coughing, because she was coughing so much at night she was waking herself up. That has been somewhat helpful and she has been getting "almost a full nights sleep" since then. She estimated she gets about 6 hours of sleep per night. She  denied any significant anxiety despite all her ongoing issues. She does feel constantly frustrated with her lack of progress, wanting her energy back, etc. She is just starting to leave the house now for things other than doctors appointments, she stated she was previously at the point where "I couldn't shower without needing to take a nap," so she curtailed her activity. She gets mad at herself for her symptoms and is more easily frustrated. With respect to appetite, she has gained 50lbs since she was sick, although she was on steroids for a time. She thinks it's because she is at home right next to the kitchen.   With respect to functioning, the patient is no longer as effective as she was and requires external aids like post it notes etc., although she is still able to do everything she used to do. She continues to do hobbies such as reading, scrap booking, and the like. She is back to working full time (albeit at home) and that goes well. She is still able to utilize the community, she is driving, and she is cooking. She does have to take it easy because she becomes easily fatigued.   Past Medical History and Review of Relevant Studies   Patient Active Problem List   Diagnosis Date Noted  . Brain fog 08/21/2019  . Hoarseness of voice 08/21/2019  . Abnormal findings on diagnostic imaging of lung 08/21/2019  . DOE (dyspnea on exertion) 04/16/2019  . Precordial chest pain 01/22/2019  . Palpitations 01/22/2019  . Tachycardia 01/15/2019  . Low TSH level 01/15/2019  . Hair loss 01/06/2019  . Upper airway cough syndrome 11/21/2018  . Urinary frequency 10/24/2018  . History of COVID-19 09/09/2018  . Frontal headache 07/14/2018  . Vitamin D deficiency 12/12/2017  . Floaters, bilateral 11/08/2017  . Pre-syncope 11/08/2017  . Stressful life events affecting family and household 11/08/2017  . Chest pain at rest 02/09/2016  . Extrinsic asthma 01/20/2016  . Labral tear of shoulder 02/01/2014  .  Bowel habit changes 04/28/2013  . Obesity, Class I, BMI 30-34.9 01/09/2013  . Diffuse cystic mastopathy 08/18/2012  . Family history of breast cancer 08/18/2012  . Healthcare maintenance 02/27/2011  . Family history of melanoma 02/27/2011  . SYNCOPE, HX OF 08/11/2009  . BREAST MASS, HX OF 08/11/2009   Review of Neuroimaging and Relevant Medical History: The patient has no recent neuroimaging of the brain on file. She has a study from 2006 that was ordered related to syncopal episodes.   Current Outpatient Medications  Medication Sig Dispense Refill  . albuterol (PROVENTIL) (2.5 MG/3ML) 0.083% nebulizer solution Take 3 mLs (2.5 mg total) by nebulization every 6 (six) hours as needed for wheezing or shortness of breath. 150 mL 0  . amitriptyline (ELAVIL) 50 MG tablet Take 50 mg by mouth at bedtime.    . Biotin 5 MG CAPS Take 1 capsule (5 mg total) by mouth daily.  0  . chlorpheniramine (CHLOR-TRIMETON) 4 MG tablet Take by mouth. 4 times daily    . cholecalciferol (VITAMIN D3) 25 MCG (1000 UNIT) tablet Take 1,000 Units by mouth daily.    Marland Kitchen  Dextromethorphan Polistirex (DELSYM PO) Take by mouth daily.    Marland Kitchen diltiazem (CARDIZEM CD) 240 MG 24 hr capsule Take 1 capsule (240 mg total) by mouth daily. 90 capsule 1  . famotidine (PEPCID) 20 MG tablet One after supper 30 tablet 11  . Fluticasone-Salmeterol (ADVAIR DISKUS) 500-50 MCG/DOSE AEPB     . gabapentin (NEURONTIN) 300 MG capsule TAKE 1 CAPSULE (300 MG TOTAL) BY MOUTH 4 (FOUR) TIMES DAILY.    Marland Kitchen ipratropium (ATROVENT) 0.02 % nebulizer solution Inhale into the lungs.    . pantoprazole (PROTONIX) 40 MG tablet Take 1 tablet (40 mg total) by mouth daily. Take 30-60 min before first meal of the day 30 tablet 2  . Respiratory Therapy Supplies (FLUTTER) DEVI Use as directed 1 each 0  . sertraline (ZOLOFT) 25 MG tablet Take by mouth. (Patient not taking: Reported on 09/10/2019)     No current facility-administered medications for this visit.   Family  History  Problem Relation Age of Onset  . Skin cancer Mother        melanoma  . Hypertension Mother        also had polio  . Irritable bowel syndrome Mother   . Breast cancer Mother 33  . Coronary artery disease Father        age 65's  . Hypertension Father   . Breast cancer Maternal Grandmother 78  . Stroke Maternal Grandmother   . Other Son        hyperinsulinemic  . Coronary artery disease Paternal Uncle        age 39's  . Skin cancer Sister 45       melanoma - cancer in eye and uterus  . Thyroid disease Sister        hashimoto thyroiditis  . Breast cancer Other        pat. great aunt  . Irritable bowel syndrome Daughter   . Diabetes Maternal Grandfather   . Heart disease Maternal Grandfather   . Diabetes Paternal Uncle   . Epilepsy Son   . Breast cancer Cousin        second cousin  . Pulmonary embolism Other        died from this after MVA  . Heart disease Paternal Grandmother   . Heart disease Paternal Grandfather   . Colon cancer Neg Hx    There is a family history of dementia. She stated that both her maternal and paternal grandparents had dementia, likely in their 85s, she wasn't sure of the exact age. There is no  family history of psychiatric illness.  Psychosocial History  Developmental, Educational and Employment History: The patient is a native of [place] and reported a normal childhood and development. She stated that she had learning problems in school, mainly with reading comprehension, and she was "borderline dyslexic." She got extra help in english-related classes and got extended time on her SATs. Nevertheless, she got good grades and worked hard. She went to [employer] on a scholarship and did her Bachelor's there, in leisure sports management and business administration. She did one year of a masters in education, she was teaching at the time, but she decided to stop because she had too much on her plate and was raising two children. She has been at her present  position for 10 years, she is an Glass blower/designer, and she coordinates 7 different sites. It sounds like her job is quite organizationally demanding, she is coordinating many things.   Psychiatric History: She has  a history of some psychiatric treatment, her son was in an alcohol related car accident and is in jail, which is still hard for her. She also did counseling and has done counseling over the years "off and on" for "things." She is not currently in counseling and was last involved about 2 years ago.   Substance Use History: The patient doesn't drink, she stopped after her son's accident. She doesn't use drugs. She does not smoke, she has a limited smoking history in high school and college.   Relationship History and Living Cimcumstances: The patient has been married to her husband for 25 years. She has two children. She has a younger daughter who lives in [place].   Mental Status and Behavioral Observations  Sensorium/Arousal: The patient's level of arousal was awake and alert. Hearing and vision were adequate for testing purposes. Orientation: The patient was generally oriented.  Appearance: Dressed in appropriate, casual clothing, with good grooming and hygiene Behavior: The patient presented as somewhat fatigued, coughed throughout the examination, and her voice was quite hoarse Speech/language: Speech was normal in rate, rhythm, and volume. She did have some breathiness and vocal strain.  Gait/Posture: Not formally examined, adequate for ambulation between rooms.  Movement: No tremors, bradykinesia, hypokinesia, or other concerning motor signs noted on observation.  Social Comportment: The patient was pleasant and appropriate Mood: "The past two weeks has not been good."  Affect: Tearful at times, although patient stated the past two weeks have been particularly hard with a cancer diagnosis in her family and an abnormal mammogram Thought process/content: Thought process was  logical, linear, and goal-oriented. She was a detailed historian and presented as reliable when cross referenced with the medical record. Thought content was appropriate and pertinent.  Safety: No thoughts of harming self or others noted on direct questioning.  Insight: Child psychotherapist Achievement Test - 4 Word Reading [employer]' Intellectual Screening Test Wechsler Adult Intelligence Scale - IV  Digit Span  Arithmetic  Symbol Search  Coding Neuropsychological Assessment Battery  Memory Module  Visual Chiropractor ACS Word Choice The Dot Counting Test Controlled Oral Word Association (F-A-S) Conservation officer, historic buildings (Animals) Trail Making Test A & B Wisconsin Card Sorting Test - [place] Patient Health Questionnaire - [place] was seen for a psychiatric diagnostic evaluation and neuropsychological testing. 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 She has a number of ongoing physical symptoms including tachycardia, SOB, and was having some syncopal episodes. It does not appear that a definitive cause other than non-specific post-viral type symptoms has been determined after extensive investigation from my review of the referring providers notes. Testing will be helpful to get a better sense of her objective cognitive abilities and for treatment planning. Full and complete note with impressions, recommendations, and interpretation of test data to follow.   Cassie Simas Nicole Kindred, PsyD, [place] Clinical Neuropsychologist  Informed Consent and Coding/Compliance  Risks and benefits of the evaluation were discussed with the patient prior to all testing procedures. I conducted a clinical interview   with Cassie [name] and Cassie [name], B.S. (Technician) administered additional test procedures.  The patient was able to tolerate the testing procedures and the patient (and/or family if applicable) is likely to benefit from further follow up to receive the diagnosis and treatment recommendations, which will be rendered at the  next encounter. Billing below reflects technician time, my direct face-to-face time with the patient, time spent in test administration, and time spent in professional activities including but not limited to: neuropsychological test interpretation, integration of neuropsychological test data with clinical history, report preparation, treatment planning, care coordination, and review of diagnostically pertinent medical history or studies.   Services associated with this encounter: Clinical Interview [phone]) plus 60 minutes (42903; Neuropsychological Evaluation by Professional)  145 minutes ([id number]; Neuropsychological Evaluation by Professional, Adl.) 30 minutes (31674; Neuropsychological Testing by Technician) 120 minutes (25525; Neuropsychological Testing by Technician, Adl.)

## 2019-09-11 NOTE — Progress Notes (Signed)
   Psychometrist Note   Cognitive testing was administered to Cassie [name] by Milana Kidney, B.S. (Technician) under the supervision of Alphonzo Severance, Psy.D., ABN. [name] was able to tolerate all test procedures. Dr. Nicole Kindred met with the patient as needed to manage any emotional reactions to the testing procedures. Rest breaks were offered.    The battery of tests administered was selected by Dr. Nicole Kindred with consideration to the patient's current level of functioning, the nature of her symptoms, emotional and behavioral responses during the interview, level of literacy, observed level of motivation/effort, and the nature of the referral question. This battery was communicated to the psychometrist. Communication between Dr. Nicole Kindred and the psychometrist was ongoing throughout the evaluation and Dr. Nicole Kindred was immediately accessible at all times. Dr. Nicole Kindred provided supervision to the technician on the date of this service, to the extent necessary to assure the quality of all services provided.    [name] will return in approximately one week for an interactive feedback session with Dr. Nicole Kindred, at which time test performance, clinical impressions, and treatment recommendations will be reviewed in detail. The patient understands she can contact our office should she require our assistance before this time.   A total of 150 minutes of billable time were spent with Cassie [name] by the technician, including test administration and scoring time. Billing for these services is reflected in Dr. Les Pou note.   This note reflects time spent with the psychometrician and does not include test scores, clinical history, or any interpretations made by Dr. Nicole Kindred. The full report will follow in a separate note.

## 2019-09-14 ENCOUNTER — Ambulatory Visit
Admission: RE | Admit: 2019-09-14 | Discharge: 2019-09-14 | Disposition: A | Discharge status: Home / Self Care | Payer: BC Managed Care – PPO | Source: Ambulatory Visit | Attending: Surgery | Admitting: Surgery

## 2019-09-14 ENCOUNTER — Encounter: Payer: BC Managed Care – PPO | Admitting: *Deleted

## 2019-09-14 ENCOUNTER — Other Ambulatory Visit: Payer: Self-pay

## 2019-09-14 DIAGNOSIS — R0600 Dyspnea, unspecified: Secondary | ICD-10-CM | POA: Diagnosis not present

## 2019-09-14 DIAGNOSIS — R928 Other abnormal and inconclusive findings on diagnostic imaging of breast: Secondary | ICD-10-CM

## 2019-09-14 DIAGNOSIS — R0609 Other forms of dyspnea: Secondary | ICD-10-CM

## 2019-09-14 DIAGNOSIS — N6489 Other specified disorders of breast: Secondary | ICD-10-CM

## 2019-09-14 DIAGNOSIS — Z79899 Other long term (current) drug therapy: Secondary | ICD-10-CM | POA: Diagnosis not present

## 2019-09-14 DIAGNOSIS — Z87891 Personal history of nicotine dependence: Secondary | ICD-10-CM | POA: Diagnosis not present

## 2019-09-14 DIAGNOSIS — R922 Inconclusive mammogram: Secondary | ICD-10-CM | POA: Diagnosis not present

## 2019-09-14 NOTE — Progress Notes (Signed)
Daily Session Note  Patient Details  Name: Cassie [name] MRN: [id number] Date of Birth: 1970-05-13 Referring Provider:     Pulmonary Rehab from 09/10/2019 in [facility]  Referring Provider Marshell Garfinkel MD      Encounter Date: 09/14/2019  Check In:  Session Check In - 09/14/19 1714      Check-In   Supervising physician immediately available to respond to emergencies See telemetry face sheet for immediately available ER MD    Location [facility] Pul[facility]    Staff Present Renita Papa, RN Margurite Auerbach, MS Exercise Physiologist;Kelly Amedeo Plenty, BS, ACSM CEP, Exercise Physiologist    Virtual Visit No    Medication changes reported     No    Fall or balance concerns reported    No    Warm-up and Cool-down Performed on first and last piece of equipment    Resistance Training Performed Yes    VAD Patient? No    PAD/SET Patient? No      Pain Assessment   Currently in Pain? No/denies              Social History   Tobacco Use  Smoking Status Former Smoker  . Packs/day: 0.10  . Years: 5.00  . Pack years: 0.50  . Types: Cigarettes  . Quit date: 02/21/2007  . Years since quitting: 12.5  Smokeless Tobacco Never Used  Tobacco Comment   social smoker    Goals Met:  Independence with exercise equipment Exercise tolerated well No report of cardiac concerns or symptoms Strength training completed today  Goals Unmet:  Not Applicable  Comments: First full day of exercise!  Patient was oriented to gym and equipment including functions, settings, policies, and procedures.  Patient's individual exercise prescription and treatment plan were reviewed.  All starting workloads were established based on the results of the 6 minute walk test done at initial orientation visit.  The plan for exercise progression was also introduced and progression will be customized based on patient's performance and goals.    Dr. Emily Filbert is Medical Director  for [facility] and [facility].

## 2019-09-15 NOTE — Progress Notes (Signed)
[facility]  Patient Name: Cassie [name] MRN: [id number] Date of Birth: Oct 12, 1970 Age: 49 y.o. Education: 17 years  Measurement properties of test scores: IQ, Index, and Standard Scores (SS): Mean = 100; Standard Deviation = 15 Scaled Scores (Ss): Mean = 10; Standard Deviation = 3 Z scores (Z): Mean = 0; Standard Deviation = 1 T scores (T); Mean = 50; Standard Deviation = 10  TEST SCORES:    Note: This summary of test scores accompanies the interpretive report and should not be interpreted by unqualified individuals or in isolation without reference to the report. Test scores are relative to age, gender, and educational history as available and appropriate.   Performance Validity        ACS: Raw  Descriptor      Word Choice 50 Within Expectation       Raw  Descriptor  The Dot Counting Test: 7 Within Expectation      Embedded Measures: Raw  Descriptor      WAIS-IV Reliable Digit Span 10 Within Expectation      WAIS-IV Reliable Digit Span Revised 16 Within Expectation      Expected Functioning        Wide Range Achievement Test: Standard/Scaled Score Percentile      Word Reading 88 21      Reynolds Intellectual Screening Test Standard/T-score Percentile      Guess What 56 73      Odd Item Out 64 92  RIST Index 119 90      Attention/Processing Speed        Wechsler Adult Intelligence Scale - IV: Standard/Scaled Score Percentile  Working Memory Index 111 77      Digit Span 10 50          Digit Span Forward 10 50          Digit Span Backward 8 25          Digit Span Sequencing 12 75      Arithmetic 14 91  Processing Speed Index 111 77      Symbol Search 12 75      Coding 12 75      Language        Neuropsychological Assessment Battery (Language Module, Form 1): T-score Percentile      Naming   (31) 53 62      Verbal Fluency:  T Score Percentile      Controlled Oral Word Association (F-A-S) 47 38      Semantic Fluency  (Animals) 47 38      Memory:        Neuropsychological Assessment Battery (Memory Module, Form 1): T-score/Standard Score Percentile  Memory Index (MEM): 105 63      List Learning           List A Immediate Recall   (8 , 11 , 12) 58 79         List B Immediate Recall   (7) 54 66         List A Short Delayed Recall   (10) 54 66         List A Long Delayed Recall   (10) 54 66         List A Percent Retention   (100 %) --- 50         List A Long Delayed Yes/No Recognition Hits   (12) --- 79         List  A Long Delayed Yes/No Recognition False Alarms   (2) --- 54         List A Recognition Discriminability Index --- 66      Shape Learning           Immediate Recognition   (4 , 6 , 9) 50 50         Delayed Recognition   (7) 51 54         Percent Retention   (78 %) --- 18         Delayed Forced-Choice Recognition Hits   (7) --- 14         Delayed Forced-Choice Recognition False Alarms   (0) --- 69         Delayed Forced-Choice Recognition Discriminability --- 31     Story Learning           Immediate Recall   (27, 39) 46 34         Delayed Recall   (37) 51 54         Percent Retention   (95 %) --- 50      Daily Living Memory            Immediate Recall   (26, 23) 59 82          Delayed Recall   (9, 8) 55 69          Percent Retention (100 %) --- 75          Recognition Hits   (10) --- 75      Visuospatial/Constructional Functioning        Neuropsychological Assessment Battery (Visuospatial Module) T-score Percentile      Visual Discrimination 58 79      Design Construction 65 93      Executive Functioning        [place] Card Sorting Test - 64: T-score Percentile      Categories --- >16%ile      Total Errors 53 62      Perseverative Errors 46 34      Nonperseverative Errors 49 46      Conceptual Level Responses 49 47      D-KEFS Color-Word Interference Test: Raw (Scaled Score) Percentile     Color Naming 29 secs. (10) 50     Word Reading 27 secs. (7) 16     Inhibition 63  secs. (8) 25        Total Errors 0 errors (12) 75     Inhibition/Switching 69 secs. (9) 37        Total Errors 0 errors (12) 75      Trail Making Test: T-Score Percentile      Part A 83 >99      Part B 60 84      Rating Scales         Raw Score Descriptor  Patient Health Questionnaire - 9 6 Mild  GAD-7 0 Minimal   Tatum Corl V. Nicole Kindred PsyD, [place] Clinical Neuropsychologist

## 2019-09-16 ENCOUNTER — Encounter: Payer: BC Managed Care – PPO | Admitting: *Deleted

## 2019-09-16 ENCOUNTER — Other Ambulatory Visit: Payer: Self-pay

## 2019-09-16 DIAGNOSIS — Z79899 Other long term (current) drug therapy: Secondary | ICD-10-CM | POA: Diagnosis not present

## 2019-09-16 DIAGNOSIS — R0609 Other forms of dyspnea: Secondary | ICD-10-CM

## 2019-09-16 DIAGNOSIS — Z87891 Personal history of nicotine dependence: Secondary | ICD-10-CM | POA: Diagnosis not present

## 2019-09-16 DIAGNOSIS — R0600 Dyspnea, unspecified: Secondary | ICD-10-CM | POA: Diagnosis not present

## 2019-09-16 NOTE — Progress Notes (Signed)
[facility]  Patient Name: Cassie [name] MRN: [id number] Date of Birth: [birth year] Age: 49 y.o. Education: 17 years  Clinical Impressions  Cassie [name] is a 49 y.o., right-hand dominant, married woman with a history of COVID back in August, 2020 and lingering symptoms since then including brain fog. She has been able to return to work full-time on modified duty (working from home) but continues to struggle with fatigue, hoarseness due to inflammation of the vocal chords, DOE, and cognitive difficulties. She feels like she is "80%" back to where she was cognitively prior to [place] but has noticed difficulties with multitasking, problems with reading comprehension, and day-to-day forgetfulness.   On neuropsychological exam, her performance was entirely within normal limits. She demonstrated overall cognitive abilities at the margin of the high average to superior ranges and levels of performance commensurate with those high expectations in all areas. A particular strength was noted on visually mediated tasks. Her working memory and processing speed fell at high average levels, memory was in the average range at an index level, and there were no patterns of low scores concerning for clear impairment or diminishment. She screened in the mild range for depression symptoms, although that was mostly due to physical symptoms.   Normal neuropsychological performance does not rule out the possibility of some cognitive problems, which in Cassie [name]'s case may be related to her ongoing difficulties with energy, physical symptoms, stress related to her chronic illness, and the like. She may also have some subtle cognitive sequelae from her COVID infection, although there is much that remains unknown about the extent to which the SARS-CoV-2 virus itself is causative of cognitive and brain changes.    Diagnostic Impressions: Subjective cognitive impairment History of  COVID 19  Recommendations to be discussed with patient  Your performance and presentation on neuropsychological testing were entirely normal. That is to say, you demonstrated expected levels of performance for you in all areas. This means that under ideal controlled circumstances, your brain is capable of good cognitive performance. It does not, however, rule out the possibility of some subtle problems. My sense is that your day-to-day issues are related to your ongoing recovery from [place] and all the physical symptoms that come with that, including fatigue, distraction due to discomfort, sleep problems and the like.  There is emerging evidence that people frequently report cognitive problems following COVID-19 including "brain fog" and difficulties with attention and executive control. There are some studies suggesting that there are measurable deficits in abilities like attention and executive function in individuals post-COVID. In some cases, this may be directly attributable to COVID-19 and it's effects on the body, such as in the setting of encephalitis, cerebrovascular consequences from blood clotting disorders, prolonged hypoxia, or ICU acquired delirium. A high rate of individuals also report such symptoms following COVID-19 even when the disease itself is "mild" and does not result in such consequences. It remains unclear to what extent these difficulties are directly attributable to COVID-19 and it's physiological effects. Most of the time, people report fairly mild problems that are irritating and interfering but are not debilitating.  In your case, I do think that COVID has caused some of your difficulties. The extent to which those relate to actual brain changes vs. other factors though is not clear. My best advice would be to continue following with your medical providers, implement optimal health strategies such as eating a nutritious, balanced diet, and make sure you are getting recommended  amounts of exercise to the extent possible and deemed medically safe by your medical treatment providers. Engage actively in stress management. You may also consider psychotherapy or self-guided stress reduction techniques such as mindfulness meditation, positive activity scheduling, and the like to make sure you are actively managing stress.    Often times when individuals have a condition that affects the brain, it sensitizes them to cognitive errors, some of which may be normal. Often times such issues are not noticed until after an insult to the brain (such as a mild head injury, for example). Do not fall into the trap of overfocusing on cognitive performance and thereby undermining your real-world performance, because focusing on how you are doing cognitively will undoubtedly distract you from the task at hand, resulting in diminished efficiency.   Do not underestimate the extent to which ongoing illness and its lingering symptoms such as fatigue, distraction due to bodily discomfort and the like can undermine memory and thinking abilities. Nobody performs at their best when they are distracted, in pain, or uncomfortable related to a lingering disease process.   For problems with attention and concentration, consider the following recommendations: . Build routines into your day and stick to them, doing the same thing at the same time helps your body get into a rhythm that makes it harder to forget something you need to do.  . Use sticky notes, reminders, a calendar, or your smart phone to provide yourself with reminders, to do lists, and help track appointments.  . When you need to do work, create an environment that is conducive to that work. This may include putting electronic devices that can be distracting outside the room and working in an area that is quiet and free from distractions.  . Break things down into smaller steps to help get started and stop yourself from feeling overwhelmed.  . Plan  breaks throughout the day where you can get up and move even if it's for just a few minutes at a time.  . Focus your attention on only one thing and avoid multitasking. Although some people are better at it than others, nobody's task performance is as good as it could be when they are alternating attention between two different tasks.  . Stay mindful throughout your day and monitor whether you are feeling overwhelmed or disorganized to identify problems before they happen.  . Avoid working under time pressure when you may be more liable to make mistakes.   Test Findings  Test scores are summarized in additional documentation associated with this encounter. Test scores are relative to age, gender, and educational history as available and appropriate. There were no concerns about performance validity as all findings fell within normal expectations.   General Intellectual Functioning/Achievement:  Performance on single word reading was low average, although Cassie [name] did very well and scored at the margin of the high average to superior ranges on a screening measure of overall intellectual ability. The latter measure presents as a better standard of comparison for her cognitive test performance because it is a more robust test. Her lower word reading score likely relates to premorbid strengths and weaknesses.   Attention and Processing Efficiency: Performance on measures of working memory was good and fell at a high average level on the Working Memory Index of the WAIS-IV. Digit repetition forward, backward, and digit resequencing in ascending order were all average. Mental solving of arithmetical word problems without paper and pencil was high average.   With respect to  processing efficiency, performance was high average with scores at the upper aspect of the average range on measures involving timed number-symbol coding and efficient visual matching/efficient visual scanning. She demonstrated average  timed color naming and low average timed word reading, which again may reflect premorbid strengths and weaknesses.   Language: Performance on language measures was good with normal naming performance. Timed generation of words in response to the letters F-A-S and in response to the category prompt "animals" was average.   Visuospatial Function: Visuospatial and constructional performance was very strong and represented an area of strength for Cassie [name] considering her overall test profile. Performance on a measure of fine visual discrimination and attention to detail was high average. Assembling two-dimensional target designs from a series of small plastic tile pieces was superior.   Learning and Memory: Performance on measures of learning and memory was within normal limits, with a score solidly within the average range on the overall index. Good acquisition and retention of information across time was demonstrated for both verbal and visual information.   In the verbal realm, immediate recall for material including a 12-item word list and brief daily living information was high average on immediate recall and average on delayed recall. Delayed recognition performance was average to high average in both cases. Memory for a short story was average on immediate and delayed recall.   In the visual realm, Cassie [name]'s performance was average when learning a series of designs that are difficult to verbally encode and her delayed recognition for these shapes was average. Delayed forced choice recognition for the designs was average with respect to discriminability.   Executive Functions: Performance on executive measures was generally good with average scores on all indicators from the Dickens, a rule-based categorization procedure emphasizing concept formation and cognitive flexibility in response to ongoing feedback. Alternating sequencing of numbers and letters of the  alphabet was high average. She performed well on timed measures evaluating her ability to inhibit a dominant response in favor of a novel response when reading color-words and when switching between response sets.   Rating Scale(s): Cassie [name] denied any anxiety symptoms whatsoever, which is a bit discrepant with her presentation. She reported mild levels of (mainly somatic) symptoms associated with depression.   Viviano Simas Nicole Kindred PsyD, [place] Clinical Neuropsychologist

## 2019-09-16 NOTE — Progress Notes (Signed)
Daily Session Note  Patient Details  Name: Cassie [name] MRN: [id number] Date of Birth: 1970/04/27 Referring Provider:     Pulmonary Rehab from 09/10/2019 in [facility]  Referring Provider Marshell Garfinkel MD      Encounter Date: 09/16/2019  Check In:  Session Check In - 09/16/19 1732      Check-In   Supervising physician immediately available to respond to emergencies See telemetry face sheet for immediately available ER MD    Location [facility]-Cardiac & Pulmonary Rehab    Staff Present Renita Papa, RN Margurite Auerbach, MS Exercise Physiologist;Melissa Caiola RDN, LDN    Virtual Visit No    Medication changes reported     No    Fall or balance concerns reported    No    Warm-up and Cool-down Performed on first and last piece of equipment    Resistance Training Performed Yes    VAD Patient? No    PAD/SET Patient? No      Pain Assessment   Currently in Pain? No/denies              Social History   Tobacco Use  Smoking Status Former Smoker  . Packs/day: 0.10  . Years: 5.00  . Pack years: 0.50  . Types: Cigarettes  . Quit date: 02/21/2007  . Years since quitting: 12.5  Smokeless Tobacco Never Used  Tobacco Comment   social smoker    Goals Met:  Independence with exercise equipment Exercise tolerated well No report of cardiac concerns or symptoms Strength training completed today  Goals Unmet:  Not Applicable  Comments: Pt able to follow exercise prescription today without complaint.  Will continue to monitor for progression.    Dr. Emily Filbert is Medical Director for [place] and [facility].

## 2019-09-17 ENCOUNTER — Encounter: Payer: BC Managed Care – PPO | Admitting: *Deleted

## 2019-09-17 DIAGNOSIS — R0609 Other forms of dyspnea: Secondary | ICD-10-CM

## 2019-09-17 DIAGNOSIS — Z87891 Personal history of nicotine dependence: Secondary | ICD-10-CM | POA: Diagnosis not present

## 2019-09-17 DIAGNOSIS — Z79899 Other long term (current) drug therapy: Secondary | ICD-10-CM | POA: Diagnosis not present

## 2019-09-17 DIAGNOSIS — R0600 Dyspnea, unspecified: Secondary | ICD-10-CM | POA: Diagnosis not present

## 2019-09-17 NOTE — Progress Notes (Signed)
Daily Session Note  Patient Details  Name: YOALI CONRY MRN: [id number] Date of Birth: 1970/07/21 Referring Provider:     Pulmonary Rehab from 09/10/2019 in [facility]  Referring Provider Marshell Garfinkel MD      Encounter Date: 09/17/2019  Check In:  Session Check In - 09/17/19 1719      Check-In   Supervising physician immediately available to respond to emergencies See telemetry face sheet for immediately available ER MD    Location [facility] & Pul[facility]    Staff Present Renita Papa, RN Margurite Auerbach, MS Exercise Physiologist;Jessica Luan Pulling, MA, RCEP, CCRP, CCET    Virtual Visit No    Medication changes reported     No    Fall or balance concerns reported    No    Warm-up and Cool-down Performed on first and last piece of equipment    Resistance Training Performed Yes    VAD Patient? No    PAD/SET Patient? No      Pain Assessment   Currently in Pain? No/denies              Social History   Tobacco Use  Smoking Status Former Smoker  . Packs/day: 0.10  . Years: 5.00  . Pack years: 0.50  . Types: Cigarettes  . Quit date: 02/21/2007  . Years since quitting: 12.5  Smokeless Tobacco Never Used  Tobacco Comment   social smoker    Goals Met:  Independence with exercise equipment Exercise tolerated well No report of cardiac concerns or symptoms Strength training completed today  Goals Unmet:  Not Applicable  Comments: Pt able to follow exercise prescription today without complaint.  Will continue to monitor for progression.    Dr. Emily Filbert is Medical Director for [place] and [facility].

## 2019-09-21 ENCOUNTER — Other Ambulatory Visit: Payer: Self-pay

## 2019-09-21 ENCOUNTER — Encounter: Payer: BC Managed Care – PPO | Admitting: *Deleted

## 2019-09-21 DIAGNOSIS — Z87891 Personal history of nicotine dependence: Secondary | ICD-10-CM | POA: Diagnosis not present

## 2019-09-21 DIAGNOSIS — R0609 Other forms of dyspnea: Secondary | ICD-10-CM

## 2019-09-21 DIAGNOSIS — R0600 Dyspnea, unspecified: Secondary | ICD-10-CM | POA: Diagnosis not present

## 2019-09-21 DIAGNOSIS — Z79899 Other long term (current) drug therapy: Secondary | ICD-10-CM | POA: Diagnosis not present

## 2019-09-21 NOTE — Progress Notes (Signed)
Daily Session Note  Patient Details  Name: Cassie [name] MRN: [id number] Date of Birth: August 04, 1970 Referring Provider:     Pulmonary Rehab from 09/10/2019 in [facility]monary Rehab  Referring Provider Marshell Garfinkel MD      Encounter Date: 09/21/2019  Check In:  Session Check In - 09/21/19 [place]      Check-In   Supervising physician immediately available to respond to emergencies See telemetry face sheet for immediately available ER MD    Location [facility] & Pulmonary Re[facility]    Staff Present Earlean Shawl, BS, ACSM CEP, Exercise Physiologist;Railynn Ballo Sherryll Burger, RN Margurite Auerbach, MS Exercise Physiologist    Virtual Visit No    Medication changes reported     No    Fall or balance concerns reported    No    Warm-up and Cool-down Performed on first and last piece of equipment    Resistance Training Performed Yes    VAD Patient? No    PAD/SET Patient? No      Pain Assessment   Currently in Pain? No/denies              Social History   Tobacco Use  Smoking Status Former Smoker  . Packs/day: 0.10  . Years: 5.00  . Pack years: 0.50  . Types: Cigarettes  . Quit date: 02/21/2007  . Years since quitting: 12.5  Smokeless Tobacco Never Used  Tobacco Comment   social smoker    Goals Met:  Independence with exercise equipment Exercise tolerated well No report of cardiac concerns or symptoms Strength training completed today  Goals Unmet:  Not Applicable  Comments: Pt able to follow exercise prescription today without complaint.  Will continue to monitor for progression.  Reviewed home exercise with pt today.  Pt plans to exercise 2 days a week with [employer] PT study of off days of rehab for exercise.  Reviewed THR, pulse, RPE, sign and symptoms, pulse oximetery and when to call 911 or MD.  Also discussed weather considerations and indoor options.  Pt voiced understanding.     Dr. Emily Filbert is Medical Director for [place] and  [facility].

## 2019-09-22 ENCOUNTER — Ambulatory Visit (INDEPENDENT_AMBULATORY_CARE_PROVIDER_SITE_OTHER): Payer: BC Managed Care – PPO | Admitting: Counselor

## 2019-09-22 ENCOUNTER — Encounter: Payer: Self-pay | Admitting: Counselor

## 2019-09-22 DIAGNOSIS — Z8616 Personal history of COVID-19: Secondary | ICD-10-CM

## 2019-09-22 DIAGNOSIS — R0609 Other forms of dyspnea: Secondary | ICD-10-CM

## 2019-09-22 DIAGNOSIS — F4320 Adjustment disorder, unspecified: Secondary | ICD-10-CM

## 2019-09-22 NOTE — Patient Instructions (Signed)
Your performance and presentation on neuropsychological testing were entirely normal. That is to say, you demonstrated expected levels of performance for you in all areas. This means that under ideal controlled circumstances, your brain is capable of good cognitive performance. It does not, however, rule out the possibility of some subtle problems. My sense is that your day-to-day issues are related to your ongoing recovery from [place] and all the physical symptoms that come with that, including fatigue, distraction due to discomfort, sleep problems and the like.  There is emerging evidence that people frequently report cognitive problems following COVID-19 including "brain fog" and difficulties with attention and executive control. There are some studies suggesting that there are measurable deficits in abilities like attention and executive function in individuals post-COVID. In some cases, this may be directly attributable to COVID-19 and it's effects on the body, such as in the setting of encephalitis, cerebrovascular consequences from blood clotting disorders, prolonged hypoxia, or ICU acquired delirium. A high rate of individuals also report such symptoms following COVID-19 even when the disease itself is "mild" and does not result in such consequences. It remains unclear to what extent these difficulties are directly attributable to COVID-19 and it's physiological effects. Most of the time, people report fairly mild problems that are irritating and interfering but are not debilitating.  In your case, I do think that COVID has caused some of your difficulties. The extent to which those relate to actual brain changes vs. other factors though is not clear. My best advice would be to continue following with your medical providers, implement optimal health strategies such as eating a nutritious, balanced diet, and make sure you are getting recommended amounts of exercise to the extent possible and deemed  medically safe by your medical treatment providers. Engage actively in stress management. You may also consider psychotherapy or self-guided stress reduction techniques such as mindfulness meditation, positive activity scheduling, and the like to make sure you are actively managing stress.    Often times when individuals have a condition that affects the brain, it sensitizes them to cognitive errors, some of which may be normal. Often times such issues are not noticed until after an insult to the brain (such as a mild head injury, for example). Do not fall into the trap of overfocusing on cognitive performance and thereby undermining your real-world performance, because focusing on how you are doing cognitively will undoubtedly distract you from the task at hand, resulting in diminished efficiency.   Do not underestimate the extent to which ongoing illness and its lingering symptoms such as fatigue, distraction due to bodily discomfort and the like can undermine memory and thinking abilities. Nobody performs at their best when they are distracted, in pain, or uncomfortable related to a lingering disease process.   For problems with attention and concentration, consider the following recommendations:  Build routines into your day and stick to them, doing the same thing at the same time helps your body get into a rhythm that makes it harder to forget something you need to do.   Use sticky notes, reminders, a calendar, or your smart phone to provide yourself with reminders, to do lists, and help track appointments.   When you need to do work, create an environment that is conducive to that work. This may include putting electronic devices that can be distracting outside the room and working in an area that is quiet and free from distractions.   Break things down into smaller steps to help get started  and stop yourself from feeling overwhelmed.   Plan breaks throughout the day where you can get up and  move even if it's for just a few minutes at a time.   Focus your attention on only one thing and avoid multitasking. Although some people are better at it than others, nobody's task performance is as good as it could be when they are alternating attention between two different tasks.   Stay mindful throughout your day and monitor whether you are feeling overwhelmed or disorganized to identify problems before they happen.   Avoid working under time pressure when you may be more liable to make mistakes.

## 2019-09-22 NOTE — Progress Notes (Signed)
[facility]  Telemedicine statement:  I discussed the limitations of neuropsychological care via telemedicine and the availability of in person appointments. The patient expressed understanding and agreed to proceed. The patient was verified with two identifiers.  The visit modality was: telephonic The patient location was: home The provider location was: office  The following individuals participated: Cassie [name]  Feedback Note: I met with Cassie [name] to review the findings resulting from her neuropsychological evaluation. Since the last appointment, she has been about the same.Time was spent reviewing the impressions and recommendations that are detailed in the evaluation report. We discussed impression of essentially normal cognitive abilities as per neuropsych testing, as reflected in the patient instructions. I also counseled her on the detrimental effects of overfocus on cognitive performance, and the link between general health, energy, motivation, and cognitive performance. She realizes that she can be hard on herself but also feels as though there have been some legitimate (albeit subtle) changes. We also reviewed her medications that may be contributing so that she can at least be aware, even if no changes are needed, because her current regimen has been determined with great care. I took time to explain the findings and answer all the patient's questions. I encouraged Cassie [name] to contact me should she have any further questions or if further follow up is desired.   Current Medications and Medical History   Current Outpatient Medications  Medication Sig Dispense Refill  . albuterol (PROVENTIL) (2.5 MG/3ML) 0.083% nebulizer solution Take 3 mLs (2.5 mg total) by nebulization every 6 (six) hours as needed for wheezing or shortness of breath. 150 mL 0  . amitriptyline (ELAVIL) 50 MG tablet Take 50 mg by mouth at bedtime.    . Biotin 5 MG  CAPS Take 1 capsule (5 mg total) by mouth daily.  0  . chlorpheniramine (CHLOR-TRIMETON) 4 MG tablet Take by mouth. 4 times daily    . cholecalciferol (VITAMIN D3) 25 MCG (1000 UNIT) tablet Take 1,000 Units by mouth daily.    Marland Kitchen Dextromethorphan Polistirex (DELSYM PO) Take by mouth daily.    Marland Kitchen diltiazem (CARDIZEM CD) 240 MG 24 hr capsule Take 1 capsule (240 mg total) by mouth daily. 90 capsule 1  . famotidine (PEPCID) 20 MG tablet One after supper 30 tablet 11  . Fluticasone-Salmeterol (ADVAIR DISKUS) 500-50 MCG/DOSE AEPB     . gabapentin (NEURONTIN) 300 MG capsule TAKE 1 CAPSULE (300 MG TOTAL) BY MOUTH 4 (FOUR) TIMES DAILY.    Marland Kitchen ipratropium (ATROVENT) 0.02 % nebulizer solution Inhale into the lungs.    . pantoprazole (PROTONIX) 40 MG tablet Take 1 tablet (40 mg total) by mouth daily. Take 30-60 min before first meal of the day 30 tablet 2  . Respiratory Therapy Supplies (FLUTTER) DEVI Use as directed 1 each 0  . sertraline (ZOLOFT) 25 MG tablet Take by mouth. (Patient not taking: Reported on 09/10/2019)     No current facility-administered medications for this visit.    Patient Active Problem List   Diagnosis Date Noted  . Brain fog 08/21/2019  . Hoarseness of voice 08/21/2019  . Abnormal findings on diagnostic imaging of lung 08/21/2019  . DOE (dyspnea on exertion) 04/16/2019  . Precordial chest pain 01/22/2019  . Palpitations 01/22/2019  . Tachycardia 01/15/2019  . Low TSH level 01/15/2019  . Hair loss 01/06/2019  . Upper airway cough syndrome 11/21/2018  . Urinary frequency 10/24/2018  . History of COVID-19 09/09/2018  . Frontal  headache 07/14/2018  . Vitamin D deficiency 12/12/2017  . Floaters, bilateral 11/08/2017  . Pre-syncope 11/08/2017  . Stressful life events affecting family and household 11/08/2017  . Chest pain at rest 02/09/2016  . Extrinsic asthma 01/20/2016  . Labral tear of shoulder 02/01/2014  . Bowel habit changes 04/28/2013  . Obesity, Class I, BMI 30-34.9  01/09/2013  . Diffuse cystic mastopathy 08/18/2012  . Family history of breast cancer 08/18/2012  . Healthcare maintenance 02/27/2011  . Family history of melanoma 02/27/2011  . SYNCOPE, HX OF 08/11/2009  . BREAST MASS, HX OF 08/11/2009    Mental Status and Behavioral Observations  Cassie [name] was available at the prespecified time for this telephonic appointment and was alert and generally oriented (orientation not formally assessed). Speech was normal in rate, rhythm, volume, and prosody. Self-reported mood was "allright" and affect as assessed by vocal quality was mainly euthymic. Thought process was logical and goal-oriented and thought content was appropriate to the topics discussed. There were no safety concerns identified at today's encounter, such as thoughts of harming self or others.   Plan  Feedback provided regarding the patient's neuropsychological evaluation. She was relieved that her test data looks clean, but also has a lingering sense that things are not the same and is eager to get back to normal. I was candid with her that normal test performance does not rule out the possibility of some subtle problems, although it does provide strong evidence against an impression of any very significant impairment Cassie [name] was encouraged to contact me if any questions arise or if further follow up is desired.   Cassie [name] Nicole Kindred, PsyD, ABN Clinical Neuropsychologist  Service(s) Provided at This Encounter: 23 minutes [phone]; Psychotherapy with patient/family)

## 2019-09-22 NOTE — Progress Notes (Signed)
Completed Initial RD Evaluation 

## 2019-09-23 ENCOUNTER — Encounter: Payer: BC Managed Care – PPO | Admitting: *Deleted

## 2019-09-23 ENCOUNTER — Other Ambulatory Visit: Payer: Self-pay

## 2019-09-23 DIAGNOSIS — R0600 Dyspnea, unspecified: Secondary | ICD-10-CM | POA: Diagnosis not present

## 2019-09-23 DIAGNOSIS — Z87891 Personal history of nicotine dependence: Secondary | ICD-10-CM | POA: Diagnosis not present

## 2019-09-23 DIAGNOSIS — Z79899 Other long term (current) drug therapy: Secondary | ICD-10-CM | POA: Diagnosis not present

## 2019-09-23 DIAGNOSIS — R0609 Other forms of dyspnea: Secondary | ICD-10-CM

## 2019-09-23 NOTE — Progress Notes (Signed)
Daily Session Note  Patient Details  Name: Cassie [name] MRN: [id number] Date of Birth: [birth year] Referring Provider:     Pulmonary Rehab from 09/10/2019 in [facility] Cardiac and Pulmonary Rehab  Referring Provider Marshell Garfinkel MD      Encounter Date: 09/23/2019  Check In:  Session Check In - 09/23/19 [place]      Check-In   Supervising physician immediately available to respond to emergencies See telemetry face sheet for immediately available ER MD    Location [facility]-Cardiac & Pulmonary Rehab    Staff Present Justin Mend RCP,RRT,BSRT;Aava Deland Sherryll Burger, RN Margurite Auerbach, MS Exercise Physiologist;Melissa Caiola RDN, LDN    Virtual Visit No    Medication changes reported     No    Fall or balance concerns reported    No    Warm-up and Cool-down Performed on first and last piece of equipment    Resistance Training Performed Yes    VAD Patient? No    PAD/SET Patient? No      Pain Assessment   Currently in Pain? No/denies              Social History   Tobacco Use  Smoking Status Former Smoker   Packs/day: 0.10   Years: 5.00   Pack years: 0.50   Types: Cigarettes   Quit date: 02/21/2007   Years since quitting: 12.5  Smokeless Tobacco Never Used  Tobacco Comment   social smoker    Goals Met:  Independence with exercise equipment Exercise tolerated well No report of cardiac concerns or symptoms Strength training completed today  Goals Unmet:  Not Applicable  Comments: Pt able to follow exercise prescription today without complaint.  Will continue to monitor for progression.    Dr. Emily Filbert is Medical Director for [place] and [facility].

## 2019-09-24 ENCOUNTER — Encounter: Payer: BC Managed Care – PPO | Admitting: *Deleted

## 2019-09-24 DIAGNOSIS — Z79899 Other long term (current) drug therapy: Secondary | ICD-10-CM | POA: Diagnosis not present

## 2019-09-24 DIAGNOSIS — Z87891 Personal history of nicotine dependence: Secondary | ICD-10-CM | POA: Diagnosis not present

## 2019-09-24 DIAGNOSIS — R0609 Other forms of dyspnea: Secondary | ICD-10-CM

## 2019-09-24 DIAGNOSIS — R0600 Dyspnea, unspecified: Secondary | ICD-10-CM | POA: Diagnosis not present

## 2019-09-24 NOTE — Progress Notes (Signed)
Daily Session Note  Patient Details  Name: Cassie [name] MRN: [id number] Date of Birth: 08/30/1970 Referring Provider:     Pulmonary Rehab from 09/10/2019 in [facility] Cardiac and Pulmonary Rehab  Referring Provider Marshell Garfinkel MD      Encounter Date: 09/24/2019  Check In:  Session Check In - 09/24/19 1723      Check-In   Supervising physician immediately available to respond to emergencies See telemetry face sheet for immediately available ER MD    Location [facility]-Cardiac & Pulmonary Rehab    Staff Present Renita Papa, RN Margurite Auerbach, MS Exercise Physiologist;Kelly Amedeo Plenty, BS, ACSM CEP, Exercise Physiologist    Virtual Visit No    Medication changes reported     No    Fall or balance concerns reported    No    Warm-up and Cool-down Performed on first and last piece of equipment    Resistance Training Performed Yes    VAD Patient? No    PAD/SET Patient? No      Pain Assessment   Currently in Pain? No/denies              Social History   Tobacco Use  Smoking Status Former Smoker  . Packs/day: 0.10  . Years: 5.00  . Pack years: 0.50  . Types: Cigarettes  . Quit date: 02/21/2007  . Years since quitting: 12.5  Smokeless Tobacco Never Used  Tobacco Comment   social smoker    Goals Met:  Independence with exercise equipment Exercise tolerated well No report of cardiac concerns or symptoms Strength training completed today  Goals Unmet:  Not Applicable  Comments: Pt able to follow exercise prescription today without complaint.  Will continue to monitor for progression.    Dr. Emily Filbert is Medical Director for [place] and [facility].

## 2019-09-28 ENCOUNTER — Encounter: Payer: BC Managed Care – PPO | Admitting: *Deleted

## 2019-09-28 ENCOUNTER — Other Ambulatory Visit: Payer: Self-pay

## 2019-09-28 DIAGNOSIS — R0609 Other forms of dyspnea: Secondary | ICD-10-CM

## 2019-09-28 DIAGNOSIS — R0600 Dyspnea, unspecified: Secondary | ICD-10-CM | POA: Diagnosis not present

## 2019-09-28 DIAGNOSIS — Z79899 Other long term (current) drug therapy: Secondary | ICD-10-CM | POA: Diagnosis not present

## 2019-09-28 DIAGNOSIS — Z87891 Personal history of nicotine dependence: Secondary | ICD-10-CM | POA: Diagnosis not present

## 2019-09-28 NOTE — Progress Notes (Signed)
Daily Session Note  Patient Details  Name: Cassie [name] MRN: [id number] Date of Birth: Oct 14, 1970 Referring Provider:     Pulmonary Rehab from 09/10/2019 in [facility] Cardiac and Pulmonary Rehab  Referring Provider Marshell Garfinkel MD      Encounter Date: 09/28/2019  Check In:  Session Check In - 09/28/19 [place]      Check-In   Supervising physician immediately available to respond to emergencies See telemetry face sheet for immediately available ER MD    Location [facility]-Cardiac & Pulmonary Rehab    Staff Present Renita Papa, RN Margurite Auerbach, MS Exercise Physiologist;Kelly Amedeo Plenty, BS, ACSM CEP, Exercise Physiologist    Virtual Visit No    Medication changes reported     No    Fall or balance concerns reported    No    Warm-up and Cool-down Performed on first and last piece of equipment    Resistance Training Performed Yes    VAD Patient? No    PAD/SET Patient? No      Pain Assessment   Currently in Pain? No/denies              Social History   Tobacco Use  Smoking Status Former Smoker  . Packs/day: 0.10  . Years: 5.00  . Pack years: 0.50  . Types: Cigarettes  . Quit date: 02/21/2007  . Years since quitting: 12.6  Smokeless Tobacco Never Used  Tobacco Comment   social smoker    Goals Met:  Independence with exercise equipment Exercise tolerated well No report of cardiac concerns or symptoms Strength training completed today  Goals Unmet:  Not Applicable  Comments: Pt able to follow exercise prescription today without complaint.  Will continue to monitor for progression.    Dr. Emily Filbert is Medical Director for [place] and [facility].

## 2019-09-30 ENCOUNTER — Other Ambulatory Visit: Payer: Self-pay

## 2019-09-30 ENCOUNTER — Encounter: Payer: BC Managed Care – PPO | Admitting: *Deleted

## 2019-09-30 DIAGNOSIS — R0609 Other forms of dyspnea: Secondary | ICD-10-CM

## 2019-09-30 DIAGNOSIS — Z79899 Other long term (current) drug therapy: Secondary | ICD-10-CM | POA: Diagnosis not present

## 2019-09-30 DIAGNOSIS — R0600 Dyspnea, unspecified: Secondary | ICD-10-CM | POA: Diagnosis not present

## 2019-09-30 DIAGNOSIS — Z87891 Personal history of nicotine dependence: Secondary | ICD-10-CM | POA: Diagnosis not present

## 2019-09-30 NOTE — Progress Notes (Signed)
Daily Session Note  Patient Details  Name: Cassie [name] MRN: [id number] Date of Birth: 1970-08-21 Referring Provider:     Pulmonary Rehab from 09/10/2019 in [facility] Cardiac and Pulmonary Rehab  Referring Provider Marshell Garfinkel MD      Encounter Date: 09/30/2019  Check In:  Session Check In - 09/30/19 1704      Check-In   Supervising physician immediately available to respond to emergencies See telemetry face sheet for immediately available ER MD    Location [facility]-Cardiac & Pulmonary Rehab    Staff Present Renita Papa, RN BSN;Joseph Lou Miner, [place] Exercise Physiologist;Amanda Oletta Darter, [place], ACSM CEP, Exercise Physiologist    Virtual Visit No    Medication changes reported     No    Fall or balance concerns reported    No    Warm-up and Cool-down Performed on first and last piece of equipment    Resistance Training Performed Yes    VAD Patient? No    PAD/SET Patient? No      Pain Assessment   Currently in Pain? No/denies              Social History   Tobacco Use  Smoking Status Former Smoker  . Packs/day: 0.10  . Years: 5.00  . Pack years: 0.50  . Types: Cigarettes  . Quit date: 02/21/2007  . Years since quitting: 12.6  Smokeless Tobacco Never Used  Tobacco Comment   social smoker    Goals Met:  Independence with exercise equipment Exercise tolerated well No report of cardiac concerns or symptoms Strength training completed today  Goals Unmet:  Not Applicable  Comments: Pt able to follow exercise prescription today without complaint.  Will continue to monitor for progression.    Dr. Emily Filbert is Medical Director for [place] and [facility].

## 2019-10-01 ENCOUNTER — Encounter: Payer: BC Managed Care – PPO | Admitting: *Deleted

## 2019-10-01 DIAGNOSIS — R0609 Other forms of dyspnea: Secondary | ICD-10-CM

## 2019-10-01 NOTE — Progress Notes (Signed)
Daily Session Note  Patient Details  Name: Cassie [name] MRN: [id number] Date of Birth: [birth year] Referring Provider:     Pulmonary Rehab from 09/10/2019 in [facility] Cardiac and Pulmonary Rehab  Referring Provider Marshell Garfinkel MD      Encounter Date: 10/01/2019  Check In:  Session Check In - 10/01/19 1730      Check-In   Supervising physician immediately available to respond to emergencies See telemetry face sheet for immediately available ER MD    Location [facility]-Cardiac & Pulmonary Rehab    Staff Present Renita Papa, RN Margurite Auerbach, MS Exercise Physiologist    Virtual Visit No    Medication changes reported     No    Fall or balance concerns reported    No    Warm-up and Cool-down Performed on first and last piece of equipment    Resistance Training Performed Yes    VAD Patient? No    PAD/SET Patient? No      Pain Assessment   Currently in Pain? No/denies              Social History   Tobacco Use  Smoking Status Former Smoker  . Packs/day: 0.10  . Years: 5.00  . Pack years: 0.50  . Types: Cigarettes  . Quit date: 02/21/2007  . Years since quitting: 12.6  Smokeless Tobacco Never Used  Tobacco Comment   social smoker    Goals Met:  Independence with exercise equipment Exercise tolerated well No report of cardiac concerns or symptoms Strength training completed today  Goals Unmet:  Not Applicable  Comments: Pt able to follow exercise prescription today without complaint.  Will continue to monitor for progression.    Dr. Emily Filbert is Medical Director for [name] and [facility].

## 2019-10-05 ENCOUNTER — Other Ambulatory Visit: Payer: Self-pay

## 2019-10-05 ENCOUNTER — Encounter: Payer: BC Managed Care – PPO | Attending: Pulmonary Disease | Admitting: *Deleted

## 2019-10-05 DIAGNOSIS — R0609 Other forms of dyspnea: Secondary | ICD-10-CM

## 2019-10-05 DIAGNOSIS — R0600 Dyspnea, unspecified: Secondary | ICD-10-CM | POA: Diagnosis not present

## 2019-10-05 NOTE — Progress Notes (Signed)
Daily Session Note  Patient Details  Name: Cassie [name] MRN: [id number] Date of Birth: November 16, 1970 Referring Provider:     Pulmonary Rehab from 09/10/2019 in [facility] Cardiac and Pulmonary Rehab  Referring Provider Marshell Garfinkel MD      Encounter Date: 10/05/2019  Check In:  Session Check In - 10/05/19 1720      Check-In   Supervising physician immediately available to respond to emergencies See telemetry face sheet for immediately available ER MD    Location [facility]-Cardiac & Pulmonary Rehab    Staff Present Renita Papa, RN Margurite Auerbach, MS Exercise Physiologist;Kelly Amedeo Plenty, BS, ACSM CEP, Exercise Physiologist    Virtual Visit No    Medication changes reported     No    Fall or balance concerns reported    No    Warm-up and Cool-down Performed on first and last piece of equipment    Resistance Training Performed Yes    VAD Patient? No    PAD/SET Patient? No      Pain Assessment   Currently in Pain? No/denies              Social History   Tobacco Use  Smoking Status Former Smoker  . Packs/day: 0.10  . Years: 5.00  . Pack years: 0.50  . Types: Cigarettes  . Quit date: 02/21/2007  . Years since quitting: 12.6  Smokeless Tobacco Never Used  Tobacco Comment   social smoker    Goals Met:  Independence with exercise equipment Exercise tolerated well No report of cardiac concerns or symptoms Strength training completed today  Goals Unmet:  Not Applicable  Comments: Pt able to follow exercise prescription today without complaint.  Will continue to monitor for progression.    Dr. Emily Filbert is Medical Director for [place] and [facility].

## 2019-10-07 ENCOUNTER — Encounter: Payer: Self-pay | Admitting: *Deleted

## 2019-10-07 DIAGNOSIS — R0609 Other forms of dyspnea: Secondary | ICD-10-CM

## 2019-10-07 NOTE — Progress Notes (Signed)
Pulmonary Individual Treatment Plan  Patient Details  Name: Cassie [name] MRN: [id number] Date of Birth: 12-02-1970 Referring Provider:     Pulmonary Rehab from 09/10/2019 in [facility] Cardiac and Pulmonary Rehab  Referring Provider Marshell Garfinkel MD      Initial Encounter Date:    Pulmonary Rehab from 09/10/2019 in [facility] Cardiac and Pulmonary Rehab  Date 09/10/19      Visit Diagnosis: DOE (dyspnea on exertion)  Patient's Home Medications on Admission:  Current Outpatient Medications:  .  albuterol (PROVENTIL) (2.5 MG/3ML) 0.083% nebulizer solution, Take 3 mLs (2.5 mg total) by nebulization every 6 (six) hours as needed for wheezing or shortness of breath., Disp: 150 mL, Rfl: 0 .  amitriptyline (ELAVIL) 50 MG tablet, Take 50 mg by mouth at bedtime., Disp: , Rfl:  .  Biotin 5 MG CAPS, Take 1 capsule (5 mg total) by mouth daily., Disp: , Rfl: 0 .  chlorpheniramine (CHLOR-TRIMETON) 4 MG tablet, Take by mouth. 4 times daily, Disp: , Rfl:  .  cholecalciferol (VITAMIN D3) 25 MCG (1000 UNIT) tablet, Take 1,000 Units by mouth daily., Disp: , Rfl:  .  Dextromethorphan Polistirex (DELSYM PO), Take by mouth daily., Disp: , Rfl:  .  diltiazem (CARDIZEM CD) 240 MG 24 hr capsule, Take 1 capsule (240 mg total) by mouth daily., Disp: 90 capsule, Rfl: 1 .  famotidine (PEPCID) 20 MG tablet, One after supper, Disp: 30 tablet, Rfl: 11 .  Fluticasone-Salmeterol (ADVAIR DISKUS) 500-50 MCG/DOSE AEPB, , Disp: , Rfl:  .  gabapentin (NEURONTIN) 300 MG capsule, TAKE 1 CAPSULE (300 MG TOTAL) BY MOUTH 4 (FOUR) TIMES DAILY., Disp: , Rfl:  .  ipratropium (ATROVENT) 0.02 % nebulizer solution, Inhale into the lungs., Disp: , Rfl:  .  pantoprazole (PROTONIX) 40 MG tablet, Take 1 tablet (40 mg total) by mouth daily. Take 30-60 min before first meal of the day, Disp: 30 tablet, Rfl: 2 .  Respiratory Therapy Supplies (FLUTTER) DEVI, Use as directed, Disp: 1 each, Rfl: 0 .  sertraline (ZOLOFT) 25 MG tablet, Take by mouth.  (Patient not taking: Reported on 09/10/2019), Disp: , Rfl:   Past Medical History: Past Medical History:  Diagnosis Date  . Allergy   . Asthma   . Cardiac disease   . Diffuse cystic mastopathy   . GERD (gastroesophageal reflux disease)   . Guttate psoriasis   . H/O syncope    cards work-up WNL (Hochrein) thought 2/2 overmedication vs arrhythmias  . Hx of endometriosis    hysterectomy  . Hx of migraines    occasional; improved since hysterectomy  . Labral tear of shoulder 02/2014   R, pending surgery Tamera Punt)  . Rosacea    mild    Tobacco Use: Social History   Tobacco Use  Smoking Status Former Smoker  . Packs/day: 0.10  . Years: 5.00  . Pack years: 0.50  . Types: Cigarettes  . Quit date: 02/21/2007  . Years since quitting: 12.6  Smokeless Tobacco Never Used  Tobacco Comment   social smoker    Labs: Recent Review Flowsheet Data    Labs for ITP Cardiac and Pulmonary Rehab Latest Ref Rng & Units 08/12/2009 02/23/2011 02/16/2016 12/13/2017   Cholestrol 0 - 200 mg/dL 168 160 158 174   LDLCALC 0 - 99 mg/dL 89 88 82 94   HDL >39.00 mg/dL 41.60 50.60 50 59.10   Trlycerides 0 - 149 mg/dL 187.0(H) 106.0 128 108.0       Pulmonary Assessment Scores:  Pulmonary Assessment  Scores    Row Name 09/03/19 1051 09/10/19 1024       ADL UCSD   ADL Phase Entry Entry    SOB Score total 57 57    Rest 1 1    Walk 2 2    Stairs 4 4    Bath 2 2    Dress 2 2    Shop 3 3      CAT Score   CAT Score 25 25      mMRC Score   mMRC Score -- 3           UCSD: Self-administered rating of dyspnea associated with activities of daily living (ADLs) 6-point scale (0 = "not at all" to 5 = "maximal or unable to do because of breathlessness")  Scoring Scores range from 0 to 120.  Minimally important difference is 5 units  CAT: CAT can identify the health impairment of COPD patients and is better correlated with disease progression.  CAT has a scoring range of zero to 40. The CAT score  is classified into four groups of low (less than 10), medium (10 - 20), high (21-30) and very high (31-40) based on the impact level of disease on health status. A CAT score over 10 suggests significant symptoms.  A worsening CAT score could be explained by an exacerbation, poor medication adherence, poor inhaler technique, or progression of COPD or comorbid conditions.  CAT MCID is 2 points  mMRC: mMRC (Modified Medical Research Council) Dyspnea Scale is used to assess the degree of baseline functional disability in patients of respiratory disease due to dyspnea. No minimal important difference is established. A decrease in score of 1 point or greater is considered a positive change.   Pulmonary Function Assessment:   Exercise Target Goals: Exercise Program Goal: Individual exercise prescription set using results from initial 6 min walk test and THRR while considering  patient's activity barriers and safety.   Exercise Prescription Goal: Initial exercise prescription builds to 30-45 minutes a day of aerobic activity, 2-3 days per week.  Home exercise guidelines will be given to patient during program as part of exercise prescription that the participant will acknowledge.  Education: Aerobic Exercise & Resistance Training: - Gives group verbal and written instruction on the various components of exercise. Focuses on aerobic and resistive training programs and the benefits of this training and how to safely progress through these programs..   Education: Exercise & Equipment Safety: - Individual verbal instruction and demonstration of equipment use and safety with use of the equipment.   Pulmonary Rehab from 09/30/2019 in [facility] Cardiac and Pulmonary Rehab  Date 09/10/19  Educator [facility]  Instruction Review Code 1- Verbalizes Understanding      Education: Exercise Physiology & General Exercise Guidelines: - Group verbal and written instruction with models to review the exercise physiology of  the cardiovascular system and associated critical values. Provides general exercise guidelines with specific guidelines to those with heart or lung disease.    Education: Flexibility, Balance, Mind/Body Relaxation: Provides group verbal/written instruction on the benefits of flexibility and balance training, including mind/body exercise modes such as yoga, pilates and tai chi.  Demonstration and skill practice provided.   Activity Barriers & Risk Stratification:  Activity Barriers & Cardiac Risk Stratification - 09/10/19 0915      Activity Barriers & Cardiac Risk Stratification   Activity Barriers Deconditioning;Shortness of Breath;History of Falls;Muscular Weakness           6 Minute Walk:  6  Minute Walk    Row Name 09/10/19 0913         6 Minute Walk   Phase Initial     Distance 1163 feet     Walk Time 6 minutes     # of Rest Breaks 0     MPH 2.2     METS 3.96     RPE 14     Perceived Dyspnea  2     VO2 Peak 13.86     Symptoms Yes (comment)     Comments SOB, coughing     Resting HR 109 bpm     Resting BP 118/56     Resting Oxygen Saturation  97 %     Exercise Oxygen Saturation  during 6 min walk 95 %     Max Ex. HR 125 bpm     Max Ex. BP 144/74     2 Minute Post BP 132/70       Interval HR   1 Minute HR 123     2 Minute HR 120     3 Minute HR 118     4 Minute HR 116     5 Minute HR 118     6 Minute HR 125     2 Minute Post HR 111     Interval Heart Rate? Yes       Interval Oxygen   Interval Oxygen? Yes     Baseline Oxygen Saturation % 97 %     1 Minute Oxygen Saturation % 96 %     1 Minute Liters of Oxygen 0 L  Room Air     2 Minute Oxygen Saturation % 95 %     2 Minute Liters of Oxygen 0 L     3 Minute Oxygen Saturation % 95 %     3 Minute Liters of Oxygen 0 L     4 Minute Oxygen Saturation % 96 %     4 Minute Liters of Oxygen 0 L     5 Minute Oxygen Saturation % 96 %     5 Minute Liters of Oxygen 0 L     6 Minute Oxygen Saturation % 96 %     6  Minute Liters of Oxygen 0 L     2 Minute Post Oxygen Saturation % 96 %     2 Minute Post Liters of Oxygen 0 L           Oxygen Initial Assessment:  Oxygen Initial Assessment - 09/03/19 1051      Home Oxygen   Home Oxygen Device None    Sleep Oxygen Prescription None    Home Exercise Oxygen Prescription None    Home Resting Oxygen Prescription None      Initial 6 min Walk   Oxygen Used None      Program Oxygen Prescription   Program Oxygen Prescription None      Intervention   Short Term Goals To learn and understand importance of monitoring SPO2 with pulse oximeter and demonstrate accurate use of the pulse oximeter.;To learn and understand importance of maintaining oxygen saturations>88%;To learn and demonstrate proper pursed lip breathing techniques or other breathing techniques.;To learn and demonstrate proper use of respiratory medications    Long  Term Goals Verbalizes importance of monitoring SPO2 with pulse oximeter and return demonstration;Maintenance of O2 saturations>88%;Exhibits proper breathing techniques, such as pursed lip breathing or other method taught during program session;Compliance with respiratory medication;Demonstrates proper use of MDI's  Oxygen Re-Evaluation:  Oxygen Re-Evaluation    Row Name 09/14/19 1722             Program Oxygen Prescription   Program Oxygen Prescription None         Home Oxygen   Home Oxygen Device None       Sleep Oxygen Prescription None       Home Exercise Oxygen Prescription None       Home Resting Oxygen Prescription None         Goals/Expected Outcomes   Short Term Goals To learn and understand importance of monitoring SPO2 with pulse oximeter and demonstrate accurate use of the pulse oximeter.;To learn and understand importance of maintaining oxygen saturations>88%;To learn and demonstrate proper pursed lip breathing techniques or other breathing techniques.;To learn and demonstrate proper use of  respiratory medications       Long  Term Goals Verbalizes importance of monitoring SPO2 with pulse oximeter and return demonstration;Maintenance of O2 saturations>88%;Exhibits proper breathing techniques, such as pursed lip breathing or other method taught during program session;Compliance with respiratory medication;Demonstrates proper use of MDI's       Comments Reviewed PLB technique with pt.  Talked about how it works and it's importance in maintaining their exercise saturations.       Goals/Expected Outcomes Short: Become more profiecient at using PLB.   Long: Become independent at using PLB.              Oxygen Discharge (Final Oxygen Re-Evaluation):  Oxygen Re-Evaluation - 09/14/19 1722      Program Oxygen Prescription   Program Oxygen Prescription None      Home Oxygen   Home Oxygen Device None    Sleep Oxygen Prescription None    Home Exercise Oxygen Prescription None    Home Resting Oxygen Prescription None      Goals/Expected Outcomes   Short Term Goals To learn and understand importance of monitoring SPO2 with pulse oximeter and demonstrate accurate use of the pulse oximeter.;To learn and understand importance of maintaining oxygen saturations>88%;To learn and demonstrate proper pursed lip breathing techniques or other breathing techniques.;To learn and demonstrate proper use of respiratory medications    Long  Term Goals Verbalizes importance of monitoring SPO2 with pulse oximeter and return demonstration;Maintenance of O2 saturations>88%;Exhibits proper breathing techniques, such as pursed lip breathing or other method taught during program session;Compliance with respiratory medication;Demonstrates proper use of MDI's    Comments Reviewed PLB technique with pt.  Talked about how it works and it's importance in maintaining their exercise saturations.    Goals/Expected Outcomes Short: Become more profiecient at using PLB.   Long: Become independent at using PLB.            Initial Exercise Prescription:  Initial Exercise Prescription - 09/10/19 0900      Date of Initial Exercise RX and Referring Provider   Date 09/10/19    Referring Provider Marshell Garfinkel MD      Treadmill   MPH 2.2    Grade 1    Minutes 15    METs 2.99      REL-XR   Level 1    Speed 50    Minutes 15    METs 2      T5 Nustep   Level 2    SPM 80    Minutes 15    METs 2      Prescription Details   Frequency (times per week) 3    Duration Progress to  30 minutes of continuous aerobic without signs/symptoms of physical distress      Intensity   THRR 40-80% of Max Heartrate 135-161    Ratings of Perceived Exertion 11-13    Perceived Dyspnea 0-4      Progression   Progression Continue to progress workloads to maintain intensity without signs/symptoms of physical distress.      Resistance Training   Training Prescription Yes    Weight 3 lb    Reps 10-15           Perform Capillary Blood Glucose checks as needed.  Exercise Prescription Changes:  Exercise Prescription Changes    Row Name 09/10/19 0900 09/16/19 1000 09/21/19 1700 09/29/19 0800       Response to Exercise   Blood Pressure (Admit) 118/56 104/72 -- 122/70    Blood Pressure (Exercise) 144/74 120/70 -- 130/72    Blood Pressure (Exit) 124/70 122/70 -- 112/68    Heart Rate (Admit) 109 bpm 107 bpm -- 103 bpm    Heart Rate (Exercise) 125 bpm 114 bpm -- 116 bpm    Heart Rate (Exit) 114 bpm 100 bpm -- 100 bpm    Oxygen Saturation (Admit) 97 % 97 % -- 98 %    Oxygen Saturation (Exercise) 95 % 96 % -- 97 %    Oxygen Saturation (Exit) 96 % 97 % -- 97 %    Rating of Perceived Exertion (Exercise) 14 13 -- 15    Perceived Dyspnea (Exercise) 2 2 -- 2    Symptoms SOB, coughing SOB, coughing -- --    Comments walk test results first full day of exercise -- --    Duration -- Progress to 30 minutes of  aerobic without signs/symptoms of physical distress Progress to 30 minutes of  aerobic without signs/symptoms  of physical distress Progress to 30 minutes of  aerobic without signs/symptoms of physical distress    Intensity -- THRR unchanged THRR unchanged THRR unchanged      Progression   Progression -- Continue to progress workloads to maintain intensity without signs/symptoms of physical distress. Continue to progress workloads to maintain intensity without signs/symptoms of physical distress. Continue to progress workloads to maintain intensity without signs/symptoms of physical distress.    Average METs -- 2.32 2.32 2.96      Resistance Training   Training Prescription -- Yes Yes Yes    Weight -- 3 lb 3 lb 3 lb    Reps -- 10-15 10-15 10-15      Interval Training   Interval Training -- No No No      Treadmill   MPH -- 2.2 2.2 2.5    Grade -- 0.5 0.5 2    Minutes -- _0 METs -- 2.84 2.84 3.6      REL-XR   Level -- -- -- 2    Minutes -- -- -- 15    METs -- -- -- 2.6      T5 Nustep   Level -- _1 Minutes -- _2 METs -- 1.8 1.8 1.9      Home Exercise Plan   Plans to continue exercise at -- -- [employer] (comment)  Exercising 2 days/wk with [facility] study on off days of rehab [employer] (comment)  Exercising 2 days/wk with [facility] study on off days of rehab    Frequency -- -- Add 2 additional days to program exercise sessions. Add 2  additional days to program exercise sessions.    Initial Home Exercises Provided -- -- 09/21/19 09/21/19           Exercise Comments:   Exercise Goals and Review:  Exercise Goals    Row Name 09/10/19 0916             Exercise Goals   Increase Physical Activity Yes       Intervention Provide advice, education, support and counseling about physical activity/exercise needs.;Develop an individualized exercise prescription for aerobic and resistive training based on initial evaluation findings, risk stratification, comorbidities and participant's personal goals.       Expected Outcomes Short Term: Attend rehab on  a regular basis to increase amount of physical activity.;Long Term: Add in home exercise to make exercise part of routine and to increase amount of physical activity.;Long Term: Exercising regularly at least 3-5 days a week.       Increase Strength and Stamina Yes       Intervention Provide advice, education, support and counseling about physical activity/exercise needs.;Develop an individualized exercise prescription for aerobic and resistive training based on initial evaluation findings, risk stratification, comorbidities and participant's personal goals.       Expected Outcomes Short Term: Increase workloads from initial exercise prescription for resistance, speed, and METs.;Short Term: Perform resistance training exercises routinely during rehab and add in resistance training at home;Long Term: Improve cardiorespiratory fitness, muscular endurance and strength as measured by increased METs and functional capacity (6MWT)       Able to understand and use rate of perceived exertion (RPE) scale Yes       Intervention Provide education and explanation on how to use RPE scale       Expected Outcomes Short Term: Able to use RPE daily in rehab to express subjective intensity level;Long Term:  Able to use RPE to guide intensity level when exercising independently       Able to understand and use Dyspnea scale Yes       Intervention Provide education and explanation on how to use Dyspnea scale       Expected Outcomes Short Term: Able to use Dyspnea scale daily in rehab to express subjective sense of shortness of breath during exertion;Long Term: Able to use Dyspnea scale to guide intensity level when exercising independently       Knowledge and understanding of Target Heart Rate Range (THRR) Yes       Intervention Provide education and explanation of THRR including how the numbers were predicted and where they are located for reference       Expected Outcomes Short Term: Able to state/look up THRR;Short Term:  Able to use daily as guideline for intensity in rehab;Long Term: Able to use THRR to govern intensity when exercising independently       Able to check pulse independently Yes       Intervention Provide education and demonstration on how to check pulse in carotid and radial arteries.;Review the importance of being able to check your own pulse for safety during independent exercise       Expected Outcomes Short Term: Able to explain why pulse checking is important during independent exercise;Long Term: Able to check pulse independently and accurately       Understanding of Exercise Prescription Yes       Intervention Provide education, explanation, and written materials on patient's individual exercise prescription       Expected Outcomes Short Term: Able to explain program exercise prescription;Long Term:  Able to explain home exercise prescription to exercise independently              Exercise Goals Re-Evaluation :  Exercise Goals Re-Evaluation    Row Name 09/14/19 1720 09/21/19 1737 09/29/19 0851         Exercise Goal Re-Evaluation   Exercise Goals Review Increase Physical Activity;Able to understand and use rate of perceived exertion (RPE) scale;Knowledge and understanding of Target Heart Rate Range (THRR);Understanding of Exercise Prescription;Increase Strength and Stamina;Able to understand and use Dyspnea scale;Able to check pulse independently Increase Physical Activity;Able to understand and use rate of perceived exertion (RPE) scale;Knowledge and understanding of Target Heart Rate Range (THRR);Understanding of Exercise Prescription;Increase Strength and Stamina;Able to understand and use Dyspnea scale;Able to check pulse independently Increase Physical Activity;Able to understand and use rate of perceived exertion (RPE) scale;Knowledge and understanding of Target Heart Rate Range (THRR);Understanding of Exercise Prescription;Increase Strength and Stamina;Able to understand and use Dyspnea  scale;Able to check pulse independently     Comments Reviewed RPE and dyspnea scales, THR and program prescription with pt today.  Pt voiced understanding and was given a copy of goals to take home. Reviewed home exercise with pt today.  Pt plans to exercise 2 days a week with [employer] [facility] study of off days of rehab for exercise.  Reviewed THR, pulse, RPE, sign and symptoms, pulse oximetery and when to call 911 or MD.  Also discussed weather considerations and indoor options.  Pt voiced understanding. Marylin is doing well in rehab. She has already increased her treadmill speed to 2.5 and incline to 2%. She is adjusting well to the exercise machines. O2 sats are maintained well. Will continue to monitor progress.     Expected Outcomes Short: Use RPE daily to regulate intensity. Long: Follow program prescription in THR. Short: Continue to attend pulmonary rehab with consistent attendence and [employer] [facility] study on off days of rehab. Long: Improve SOB and be able to maintain an idependent exercise program following Pulmonary Rehab. Short: Continue to increase workloads Long: Progress overall MET level/ increase strength and stamina            Discharge Exercise Prescription (Final Exercise Prescription Changes):  Exercise Prescription Changes - 09/29/19 0800      Response to Exercise   Blood Pressure (Admit) 122/70    Blood Pressure (Exercise) 130/72    Blood Pressure (Exit) 112/68    Heart Rate (Admit) 103 bpm    Heart Rate (Exercise) 116 bpm    Heart Rate (Exit) 100 bpm    Oxygen Saturation (Admit) 98 %    Oxygen Saturation (Exercise) 97 %    Oxygen Saturation (Exit) 97 %    Rating of Perceived Exertion (Exercise) 15    Perceived Dyspnea (Exercise) 2    Duration Progress to 30 minutes of  aerobic without signs/symptoms of physical distress    Intensity THRR unchanged      Progression   Progression Continue to progress workloads to maintain intensity without signs/symptoms of physical distress.     Average METs 2.96      Resistance Training   Training Prescription Yes    Weight 3 lb    Reps 10-15      Interval Training   Interval Training No      Treadmill   MPH 2.5    Grade 2    Minutes 15    METs 3.6      REL-XR   Level 2    Minutes 15  METs 2.6      T5 Nustep   Level 2    Minutes 15    METs 1.9      Home Exercise Plan   Plans to continue exercise at [facility] (comment)   Exercising 2 days/wk with [employer] [facility] study on off days of rehab   Frequency Add 2 additional days to program exercise sessions.    Initial Home Exercises Provided 09/21/19           Nutrition:  Target Goals: Understanding of nutrition guidelines, daily intake of sodium <1528m, cholesterol <2040m calories 30% from fat and 7% or less from saturated fats, daily to have 5 or more servings of fruits and vegetables.  Education: Controlling Sodium/Reading Food Labels -Group verbal and written material supporting the discussion of sodium use in heart healthy nutrition. Review and explanation with models, verbal and written materials for utilization of the food label.   Education: General Nutrition Guidelines/Fats and Fiber: -Group instruction provided by verbal, written material, models and posters to present the general guidelines for heart healthy nutrition. Gives an explanation and review of dietary fats and fiber.   Biometrics:  Pre Biometrics - 09/10/19 0918      Pre Biometrics   Height 5' 5.6" (1.666 m)    Weight 206 lb 4.8 oz (93.6 kg)    BMI (Calculated) 33.71    Single Leg Stand 14 seconds            Nutrition Therapy Plan and Nutrition Goals:  Nutrition Therapy & Goals - 09/22/19 1210      Nutrition Therapy   Diet heart healthy, low Na, Pulmonary MNT    Protein (specify units) 75g    Fiber 25 grams    Whole Grain Foods 3 servings    Saturated Fats 12 max. grams    Fruits and Vegetables 5 servings/day    Sodium 1.5 grams      Personal Nutrition Goals    Nutrition Goal ST: Power bowls and buddhas LT: improve ADL, improving fitness, improve energy (changes on time of day, highs of 5/10)    Comments B: cereal or herberlife shake L: sandwich and fruit  - white bread S: granola bar or goldfish D: hello fresh meal, breakfast for dinner, or takeout. Feels SOB when eating and for a while after. Drinks: herbalife tea and water - drinks all day. Discussed heart healthy eating and meal planning. discussed pulmonary MNT.      Intervention Plan   Intervention Prescribe, educate and counsel regarding individualized specific dietary modifications aiming towards targeted core components such as weight, hypertension, lipid management, diabetes, heart failure and other comorbidities.;Nutrition handout(s) given to patient.    Expected Outcomes Short Term Goal: Understand basic principles of dietary content, such as calories, fat, sodium, cholesterol and nutrients.;Short Term Goal: A plan has been developed with personal nutrition goals set during dietitian appointment.;Long Term Goal: Adherence to prescribed nutrition plan.           Nutrition Assessments:  Nutrition Assessments - 09/03/19 1050      MEDFICTS Scores   Pre Score 21           MEDIFICTS Score Key:          ?70 Need to make dietary changes          40-70 Heart Healthy Diet         ? 40 Therapeutic Level Cholesterol Diet  Nutrition Goals Re-Evaluation:   Nutrition Goals Discharge (Final Nutrition Goals Re-Evaluation):  Psychosocial: Target Goals: Acknowledge presence or absence of significant depression and/or stress, maximize coping skills, provide positive support system. Participant is able to verbalize types and ability to use techniques and skills needed for reducing stress and depression.   Education: Depression - Provides group verbal and written instruction on the correlation between heart/lung disease and depressed mood, treatment options, and the stigmas associated with  seeking treatment.   Education: Sleep Hygiene -Provides group verbal and written instruction about how sleep can affect your health.  Define sleep hygiene, discuss sleep cycles and impact of sleep habits. Review good sleep hygiene tips.    Education: Stress and Anxiety: - Provides group verbal and written instruction about the health risks of elevated stress and causes of high stress.  Discuss the correlation between heart/lung disease and anxiety and treatment options. Review healthy ways to manage with stress and anxiety.   Initial Review & Psychosocial Screening:  Initial Psych Review & Screening - 09/03/19 1043      Initial Review   Current issues with Current Anxiety/Panic;Current Stress Concerns;Current Sleep Concerns    Source of Stress Concerns Chronic Illness;Family;Unable to participate in former interests or hobbies;Unable to perform yard/household activities    Comments Frustration form not being able to go and do from dealing with COVID recovery over last year, meds help with sleep and cough, mother just diagnosised with breast cancer this month      [place]? Yes   husband, mother and father, kids (daughter just moved to [place])     Barriers   Psychosocial barriers to participate in program There are no identifiable barriers or psychosocial needs.;The patient should benefit from training in stress management and relaxation.;Psychosocial barriers identified (see note)      Screening Interventions   Interventions Encouraged to exercise;Provide feedback about the scores to participant;To provide support and resources with identified psychosocial needs    Expected Outcomes Short Term goal: Utilizing psychosocial counselor, staff and physician to assist with identification of specific Stressors or current issues interfering with healing process. Setting desired goal for each stressor or current issue identified.;Long Term Goal: Stressors or  current issues are controlled or eliminated.;Short Term goal: Identification and review with participant of any Quality of Life or Depression concerns found by scoring the questionnaire.;Long Term goal: The participant improves quality of Life and PHQ9 Scores as seen by post scores and/or verbalization of changes           Quality of Life Scores:  Scores of 19 and below usually indicate a poorer quality of life in these areas.  A difference of  2-3 points is a clinically meaningful difference.  A difference of 2-3 points in the total score of the Quality of Life Index has been associated with significant improvement in overall quality of life, self-image, physical symptoms, and general health in studies assessing change in quality of life.  PHQ-9: Recent Review Flowsheet Data    Depression screen [facility] 2/9 09/10/2019 01/05/2019 12/20/2017   Decreased Interest 1 0 2   Down, Depressed, Hopeless 0 0 1   PHQ - 2 Score 1 0 3   Altered sleeping 1 - 2   Tired, decreased energy 2 - 2   Change in appetite 0 - 0   Feeling bad or failure about yourself  0 - 0   Trouble concentrating 0 - 0   Moving slowly or fidgety/restless 0 - 0   Suicidal thoughts 0 - 0   PHQ-9 Score  4 - 7   Difficult doing work/chores Somewhat difficult - -     Interpretation of Total Score  Total Score Depression Severity:  1-4 = Minimal depression, 5-9 = Mild depression, 10-14 = Moderate depression, 15-19 = Moderately severe depression, 20-27 = Severe depression   Psychosocial Evaluation and Intervention:  Psychosocial Evaluation - 09/03/19 1052      Psychosocial Evaluation & Interventions   Interventions Stress management education;Encouraged to exercise with the program and follow exercise prescription    Comments 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  She was diganoised a year ago and is still recovering.  She still is having extreme SOB and hoarseness, and extreme fatigue.  She wants to be able to  go into work and get out of the house to be able to do more and breathe better.  She is originally from [place] and went to [place] for school. Her husband works for school and her family is now in [place], [place].  She continues to struggle with some brain fog but it is starting to clear.  This has all really been frustrating for her as she is used to being able to just go and do.  She is hoping to be able to come to class after her work day so that she can continue to work as much as possible.  Her family has also just found out that her mom has breast cancer.  She is looking forward to feeling better.    Expected Outcomes Short: Attend rehab to improve stamina and SOB  Long: Continue to stay positive and    Continue Psychosocial Services  Follow up required by staff           Psychosocial Re-Evaluation:   Psychosocial Discharge (Final Psychosocial Re-Evaluation):   Education: Education Goals: Education classes will be provided on a weekly basis, covering required topics. Participant will state understanding/return demonstration of topics presented.  Learning Barriers/Preferences:  Learning Barriers/Preferences - 09/03/19 1042      Learning Barriers/Preferences   Learning Barriers Sight   glasses to drive   Learning Preferences Skilled Demonstration           General Pulmonary Education Topics:  Infection Prevention: - Provides verbal and written material to individual with discussion of infection control including proper hand washing and proper equipment cleaning during exercise session.   Pulmonary Rehab from 09/30/2019 in [facility] Cardiac and Pulmonary Rehab  Date 09/10/19  Educator [facility]  Instruction Review Code 1- Verbalizes Understanding      Falls Prevention: - Provides verbal and written material to individual with discussion of falls prevention and safety.   Pulmonary Rehab from 09/30/2019 in [facility] Cardiac and Pulmonary Rehab  Date 09/10/19  Educator [facility]  Instruction Review  Code 1- Verbalizes Understanding      Chronic Lung Diseases: - Group verbal and written instruction to review updates, respiratory medications, advancements in procedures and treatments. Discuss use of supplemental oxygen including available portable oxygen systems, continuous and intermittent flow rates, concentrators, personal use and safety guidelines. Review proper use of inhaler and spacers. Provide informative websites for self-education.    Pulmonary Rehab from 09/30/2019 in [facility] Cardiac and Pulmonary Rehab  Date 09/10/19  Instruction Review Code 3- Needs Reinforcement  [need identified]      Energy Conservation: - Provide group verbal and written instruction for methods to conserve energy, plan and organize activities. Instruct on pacing techniques, use of adaptive equipment and posture/positioning to relieve shortness of breath.  Pulmonary Rehab from 09/30/2019 in [facility] Cardiac and Pulmonary Rehab  Date 09/30/19  Educator [facility]  Instruction Review Code 1- Verbalizes Understanding      Triggers and Exacerbations: - Group verbal and written instruction to review types of environmental triggers and ways to prevent exacerbations. Discuss weather changes, air quality and the benefits of nasal washing. Review warning signs and symptoms to help prevent infections. Discuss techniques for effective airway clearance, coughing, and vibrations.   Pulmonary Rehab from 09/30/2019 in [facility] Cardiac and Pulmonary Rehab  Date 09/30/19  Educator [facility]  Instruction Review Code 1- Verbalizes Understanding      AED/CPR: - Group verbal and written instruction with the use of models to demonstrate the basic use of the AED with the basic ABC's of resuscitation.   Anatomy and Physiology of the Lungs: - Group verbal and written instruction with the use of models to provide basic lung anatomy and physiology related to function, structure and complications of lung disease.   Pulmonary Rehab from 09/30/2019 in  [facility] Cardiac and Pulmonary Rehab  Date 09/30/19  Educator [facility]  Instruction Review Code 1- Verbalizes Understanding      Anatomy & Physiology of the Heart: - Group verbal and written instruction and models provide basic cardiac anatomy and physiology, with the coronary electrical and arterial systems. Review of Valvular disease and Heart Failure   Cardiac Medications: - Group verbal and written instruction to review commonly prescribed medications for heart disease. Reviews the medication, class of the drug, and side effects.   Pulmonary Rehab from 09/30/2019 in [facility] Cardiac and Pulmonary Rehab  Date 09/16/19  Educator [facility]  Instruction Review Code 1- Verbalizes Understanding      Other: -Provides group and verbal instruction on various topics (see comments)   Knowledge Questionnaire Score:    Core Components/Risk Factors/Patient Goals at Admission:  Personal Goals and Risk Factors at Admission - 09/10/19 0918      Core Components/Risk Factors/Patient Goals on Admission    Weight Management Yes;Obesity;Weight Loss    Intervention Weight Management: Develop a combined nutrition and exercise program designed to reach desired caloric intake, while maintaining appropriate intake of nutrient and fiber, sodium and fats, and appropriate energy expenditure required for the weight goal.;Weight Management: Provide education and appropriate resources to help participant work on and attain dietary goals.;Weight Management/Obesity: Establish reasonable short term and long term weight goals.;Obesity: Provide education and appropriate resources to help participant work on and attain dietary goals.    Admit Weight 206 lb 4.8 oz (93.6 kg)    Goal Weight: Short Term 200 lb (90.7 kg)    Goal Weight: Long Term 195 lb (88.5 kg)    Expected Outcomes Short Term: Continue to assess and modify interventions until short term weight is achieved;Long Term: Adherence to nutrition and physical activity/exercise  program aimed toward attainment of established weight goal;Weight Loss: Understanding of general recommendations for a balanced deficit meal plan, which promotes 1-2 lb weight loss per week and includes a negative energy balance of [phone] kcal/d;Understanding recommendations for meals to include 15-35% energy as protein, 25-35% energy from fat, 35-60% energy from carbohydrates, less than 291m of dietary cholesterol, 20-35 gm of total fiber daily;Understanding of distribution of calorie intake throughout the day with the consumption of 4-5 meals/snacks    Improve shortness of breath with ADL's Yes    Intervention Provide education, individualized exercise plan and daily activity instruction to help decrease symptoms of SOB with activities of daily living.    Expected  Outcomes Short Term: Improve cardiorespiratory fitness to achieve a reduction of symptoms when performing ADLs;Long Term: Be able to perform more ADLs without symptoms or delay the onset of symptoms           Education:Diabetes - Individual verbal and written instruction to review signs/symptoms of diabetes, desired ranges of glucose level fasting, after meals and with exercise. Acknowledge that pre and post exercise glucose checks will be done for 3 sessions at entry of program.   Education: Know Your Numbers and Risk Factors: -Group verbal and written instruction about important numbers in your health.  Discussion of what are risk factors and how they play a role in the disease process.  Review of Cholesterol, Blood Pressure, Diabetes, and BMI and the role they play in your overall health.   Core Components/Risk Factors/Patient Goals Review:    Core Components/Risk Factors/Patient Goals at Discharge (Final Review):    ITP Comments:  ITP Comments    Row Name 09/03/19 1059 09/10/19 0913 09/14/19 1718 09/22/19 1232 10/07/19 0555   ITP Comments Completed virtual orientation today.  EP evaluation is scheduled for Thurs 9/9 at  730am.  Documentation for diagnosis can be found in [facility] encounter 08/21/19. Completed 6MWT and gym orientation. Initial ITP created and sent for review to Dr. Emily Filbert, Medical Director. First full day of exercise!  Patient was oriented to gym and equipment including functions, settings, policies, and procedures.  Patient's individual exercise prescription and treatment plan were reviewed.  All starting workloads were established based on the results of the 6 minute walk test done at initial orientation visit.  The plan for exercise progression was also introduced and progression will be customized based on patient's performance and goals. Completed Initial RD Evaluation 30 Day review completed. Medical Director ITP review done, changes made as directed, and signed approval by Medical Director.          Comments:

## 2019-10-12 ENCOUNTER — Encounter: Payer: BC Managed Care – PPO | Admitting: *Deleted

## 2019-10-12 ENCOUNTER — Other Ambulatory Visit: Payer: Self-pay

## 2019-10-12 DIAGNOSIS — R0600 Dyspnea, unspecified: Secondary | ICD-10-CM | POA: Diagnosis not present

## 2019-10-12 DIAGNOSIS — R0609 Other forms of dyspnea: Secondary | ICD-10-CM

## 2019-10-12 NOTE — Progress Notes (Signed)
Daily Session Note  Patient Details  Name: Cassie [name] MRN: [id number] Date of Birth: [birth year] Referring Provider:     Pulmonary Rehab from 09/10/2019 in [facility] Cardiac and Pulmonary Rehab  Referring Provider Marshell Garfinkel MD      Encounter Date: 10/12/2019  Check In:  Session Check In - 10/12/19 1712      Check-In   Supervising physician immediately available to respond to emergencies See telemetry face sheet for immediately available ER MD    Location [facility]-Cardiac & Pulmonary Rehab    Staff Present Renita Papa, RN Margurite Auerbach, MS Exercise Physiologist;Kelly Amedeo Plenty, BS, ACSM CEP, Exercise Physiologist    Virtual Visit No    Medication changes reported     No    Fall or balance concerns reported    No    Warm-up and Cool-down Performed on first and last piece of equipment    Resistance Training Performed Yes    VAD Patient? No    PAD/SET Patient? No      Pain Assessment   Currently in Pain? No/denies              Social History   Tobacco Use  Smoking Status Former Smoker  . Packs/day: 0.10  . Years: 5.00  . Pack years: 0.50  . Types: Cigarettes  . Quit date: 02/21/2007  . Years since quitting: 12.6  Smokeless Tobacco Never Used  Tobacco Comment   social smoker    Goals Met:  Independence with exercise equipment Exercise tolerated well No report of cardiac concerns or symptoms Strength training completed today  Goals Unmet:  Not Applicable  Comments: Pt able to follow exercise prescription today without complaint.  Will continue to monitor for progression.    Dr. Emily Filbert is Medical Director for [facility] and [facility].

## 2019-10-19 ENCOUNTER — Other Ambulatory Visit: Payer: Self-pay

## 2019-10-19 DIAGNOSIS — R0600 Dyspnea, unspecified: Secondary | ICD-10-CM | POA: Diagnosis not present

## 2019-10-19 DIAGNOSIS — R0609 Other forms of dyspnea: Secondary | ICD-10-CM

## 2019-10-19 NOTE — Progress Notes (Signed)
Daily Session Note  Patient Details  Name: Cassie [name] MRN: [id number] Date of Birth: 02/25/1970 Referring Provider:     Pulmonary Rehab from 09/10/2019 in [facility] Cardiac and Pulmonary Rehab  Referring Provider Mannam, Praveen MD      Encounter Date: 10/19/2019  Check In:  Session Check In - 10/19/19 1742      Check-In   Supervising physician immediately available to respond to emergencies See telemetry face sheet for immediately available ER MD    Location [facility]-Cardiac & Pulmonary Rehab    Staff Present Meredith Craven, RN BSN;Kelly Hayes, BS, ACSM CEP, Exercise Physiologist;Kara Langdon, MS Exercise Physiologist;Other   Kelly R. Bollinger, RN   Virtual Visit No    Medication changes reported     No    Fall or balance concerns reported    No    Warm-up and Cool-down Performed on first and last piece of equipment    Resistance Training Performed Yes    VAD Patient? No    PAD/SET Patient? No      Pain Assessment   Currently in Pain? No/denies              Social History   Tobacco Use  Smoking Status Former Smoker  . Packs/day: 0.10  . Years: 5.00  . Pack years: 0.50  . Types: Cigarettes  . Quit date: 02/21/2007  . Years since quitting: 12.6  Smokeless Tobacco Never Used  Tobacco Comment   social smoker    Goals Met:  Proper associated with RPD/PD & O2 Sat Independence with exercise equipment Exercise tolerated well No report of cardiac concerns or symptoms Strength training completed today  Goals Unmet:  Not Applicable  Comments: Exercise tolerated well.    Dr. Mark Miller is Medical Director for [facility] and [facility]. 

## 2019-10-21 ENCOUNTER — Other Ambulatory Visit: Payer: Self-pay

## 2019-10-21 DIAGNOSIS — R0600 Dyspnea, unspecified: Secondary | ICD-10-CM | POA: Diagnosis not present

## 2019-10-21 DIAGNOSIS — R053 Chronic cough: Secondary | ICD-10-CM | POA: Insufficient documentation

## 2019-10-21 DIAGNOSIS — R0609 Other forms of dyspnea: Secondary | ICD-10-CM

## 2019-10-21 NOTE — Progress Notes (Signed)
Cardiology Office Note   Date:  10/22/2019   ID:  Cassie [name] 02-24-1970, MRN [id number]  PCP:  Ria Bush, MD  Cardiologist:   Minus Breeding, MD   Chief Complaint  Patient presents with  . Cough      History of Present Illness: Cassie [name] is a 49 y.o. female who presents for evaluation of dizziness.  She had chest pain in the past.  She had normal coronary arteries on cardiac catheterization in 2006.  Myoview obtained on 02/10/2016 was low risk with EF of 78%, no reversible ischemia or infarction.  Echocardiogram obtained on the same day showed EF 60 to 65%, no significant valve disorder.  The patient was referred to cardiology service for evaluation of palpitation and dizziness.  She was diagnosed with COVID-19 in August 2020.  Since her diagnosis, she has had persistent dizziness upon standing.  She also had racing heartbeat that fluctuate to low heart rate.   Repeat echocardiogram obtained on 02/11/2019 showed EF 60 to 65%, normal pulmonary artery pressure, no significant valve disorder.  3 days ZIO monitor did not show significant arrhythmia. She wore a 14 day monitor and had rare supraventricular beats.  Unfortunately she is having "long haul" symptoms from her Covid infection.   She has been seen in the [facility] clinic.  I treated her with Cardizem CD.  Inflammatory markers including hs trop were negative.   She has an appt with our pulmonary.   They did do a CT without any acute findings.  She did have normal C-reactive protein, sed rate, troponin and BNP.  At the last visit I increased her Cardizem.   She is slowly finally getting better.  She is working with voice and speech therapy.  She has had PT study at [facility].  She is working with pulmonary rehab.  She is probably about 50% toward her baseline.  Her cough is improved but still there.  She still fatigued.  Her breathing is still not quite at baseline but she is not describing any PND or orthopnea.  Her  palpitations are improved and her blood pressure is okay.   Past Medical History:  Diagnosis Date  . Allergy   . Asthma   . Cardiac disease   . Diffuse cystic mastopathy   . GERD (gastroesophageal reflux disease)   . Guttate psoriasis   . H/O syncope    cards work-up WNL (Sorren Vallier) thought 2/2 overmedication vs arrhythmias  . Hx of endometriosis    hysterectomy  . Hx of migraines    occasional; improved since hysterectomy  . Labral tear of shoulder 02/2014   R, pending surgery Tamera Punt)  . Rosacea    mild    Past Surgical History:  Procedure Laterality Date  . BREAST BIOPSY  2010   left; core biopsy  . BREAST EXCISIONAL BIOPSY Bilateral    multiple bilateral  . BREAST MASS EXCISION  2008  . BREAST SURGERY Right    excision of lesion  . CARDIAC CATHETERIZATION  2007  . CHOLECYSTECTOMY  2008  . COLONOSCOPY  2006   WNL Nicolasa Ducking)  . COLONOSCOPY  06/2013   mod diverticulosis, rpt 10 yrs Henrene Pastor)  . LAPAROSCOPY  1994   endometriosis  . TONSILLECTOMY  1994  . TONSILLECTOMY AND ADENOIDECTOMY  1995  . TOTAL ABDOMINAL HYSTERECTOMY     Dr Rayford Halsted for endometriosis     Current Outpatient Medications  Medication Sig Dispense Refill  . albuterol (PROVENTIL) (2.5 MG/3ML) 0.083%  nebulizer solution Take 3 mLs (2.5 mg total) by nebulization every 6 (six) hours as needed for wheezing or shortness of breath. 150 mL 0  . amitriptyline (ELAVIL) 50 MG tablet Take 50 mg by mouth at bedtime.    . Biotin 5 MG CAPS Take 1 capsule (5 mg total) by mouth daily.  0  . chlorpheniramine (CHLOR-TRIMETON) 4 MG tablet Take by mouth. 4 times daily    . cholecalciferol (VITAMIN D3) 25 MCG (1000 UNIT) tablet Take 1,000 Units by mouth daily.    Marland Kitchen Dextromethorphan Polistirex (DELSYM PO) Take by mouth daily.    Marland Kitchen diltiazem (CARDIZEM CD) 240 MG 24 hr capsule Take 1 capsule (240 mg total) by mouth daily. 90 capsule 3  . famotidine (PEPCID) 20 MG tablet One after supper 30 tablet 11  . gabapentin (NEURONTIN)  300 MG capsule Take 1 capsule (300 mg total) by mouth 2 (two) times daily. 180 capsule 3  . ipratropium (ATROVENT) 0.02 % nebulizer solution Inhale into the lungs.    . pantoprazole (PROTONIX) 40 MG tablet Take 1 tablet (40 mg total) by mouth daily. Take 30-60 min before first meal of the day 30 tablet 2  . Respiratory Therapy Supplies (FLUTTER) DEVI Use as directed 1 each 0   No current facility-administered medications for this visit.    Allergies:   Fish allergy, Shellfish allergy, Chocolate, Cocoa, Penicillins, and Tape    ROS:  Please see the history of present illness.   Otherwise, review of systems are positive for none.   All other systems are reviewed and negative.    PHYSICAL EXAM: VS:  BP 122/81   Pulse 80   Ht 5\' 4"  (1.626 m)   Wt 207 lb 3.2 oz (94 kg)   LMP 01/27/2000   SpO2 98%   BMI 35.57 kg/m  , BMI Body mass index is 35.57 kg/m. GENERAL:  Well appearing NECK:  No jugular venous distention, waveform within normal limits, carotid upstroke brisk and symmetric, no bruits, no thyromegaly LUNGS:  Clear to auscultation bilaterally CHEST:  Unremarkable HEART:  PMI not displaced or sustained,S1 and S2 within normal limits, no S3, no S4, no clicks, no rubs, no murmurs ABD:  Flat, positive bowel sounds normal in frequency in pitch, no bruits, no rebound, no guarding, no midline pulsatile mass, no hepatomegaly, no splenomegaly EXT:  2 plus pulses throughout, no edema, no cyanosis no clubbing   EKG:  EKG is  ordered today. Sinus rhythm, rate 81, axis within normal, intervals within normal limits, no acute ST-T wave changes.   Recent Labs: 10/24/2018: Brain Natriuretic Peptide 32 01/15/2019: ALT 21; BUN 12; Creatinine, Ser 1.02; Potassium 4.5; Sodium 140; TSH 1.62 03/17/2019: Hemoglobin 13.9; Platelets 231.0 06/18/2019: NT-Pro BNP 42    Lipid Panel    Component Value Date/Time   CHOL 174 12/13/2017 0829   CHOL 158 02/16/2016 0826   TRIG 108.0 12/13/2017 0829   HDL  59.10 12/13/2017 0829   HDL 50 02/16/2016 0826   CHOLHDL 3 12/13/2017 0829   VLDL 21.6 12/13/2017 0829   LDLCALC 94 12/13/2017 0829   LDLCALC 82 02/16/2016 0826      Wt Readings from Last 3 Encounters:  10/22/19 207 lb 3.2 oz (94 kg)  09/10/19 206 lb (93.4 kg)  09/10/19 206 lb 4.8 oz (93.6 kg)      Other studies Reviewed: Additional studies/ records that were reviewed today include:  None Review of the above records demonstrates:  Please see elsewhere in the note.  ASSESSMENT AND PLAN:   PALPITATIONS:     She will remain on the current dose of Cardizem.  No change in therapy.  She had normal previous echo and normal inflammatory markers.  No further imaging is indicated.  COVID:   We did discuss the vaccine and she is going to get it.  Of note she was being treated with gabapentin post COVID and is wondering whether she should continue this.  We are going to do cystoscopy twice daily and she can ask pulmonary and the Covid clinic whether she should continue with Adderall.  Current medicines are reviewed at length with the patient today.  The patient does not have concerns regarding medicines.  The following changes have been made:  As above  Labs/ tests ordered today include: None  Orders Placed This Encounter  Procedures  . EKG 12-Lead     Disposition:   FU with me in 6 months.    Signed, Minus Breeding, MD  10/22/2019 2:33 PM    [facility]

## 2019-10-21 NOTE — Progress Notes (Signed)
Daily Session Note  Patient Details  Name: OBDULIA STEIER MRN: [id number] Date of Birth: 05-07-1970 Referring Provider:     Pulmonary Rehab from 09/10/2019 in [facility] Cardiac and Pulmonary Rehab  Referring Provider Marshell Garfinkel MD      Encounter Date: 10/21/2019  Check In:  Session Check In - 10/21/19 1718      Check-In   Supervising physician immediately available to respond to emergencies See telemetry face sheet for immediately available ER MD    Location [facility]-Cardiac & Pulmonary Rehab    Staff Present Renita Papa, RN BSN;Joseph Hood RCP,RRT,BSRT;Kylan Veach [place], MPA, RN;Melissa Frisbee RDN, LDN    Virtual Visit No    Medication changes reported     No    Fall or balance concerns reported    No    Warm-up and Cool-down Performed on first and last piece of equipment    Resistance Training Performed Yes    VAD Patient? No    PAD/SET Patient? No      Pain Assessment   Currently in Pain? No/denies              Social History   Tobacco Use  Smoking Status Former Smoker  . Packs/day: 0.10  . Years: 5.00  . Pack years: 0.50  . Types: Cigarettes  . Quit date: 02/21/2007  . Years since quitting: 12.6  Smokeless Tobacco Never Used  Tobacco Comment   social smoker    Goals Met:  Independence with exercise equipment Exercise tolerated well Personal goals reviewed No report of cardiac concerns or symptoms Strength training completed today  Goals Unmet:  Not Applicable  Comments: Pt able to follow exercise prescription today without complaint.  Will continue to monitor for progression.   Dr. Emily Filbert is Medical Director for [place] and [facility].

## 2019-10-22 ENCOUNTER — Ambulatory Visit: Payer: BC Managed Care – PPO | Admitting: Cardiology

## 2019-10-22 ENCOUNTER — Encounter: Payer: BC Managed Care – PPO | Admitting: *Deleted

## 2019-10-22 ENCOUNTER — Encounter: Payer: Self-pay | Admitting: Cardiology

## 2019-10-22 VITALS — BP 122/81 | HR 80 | Ht 64.0 in | Wt 207.2 lb

## 2019-10-22 DIAGNOSIS — R059 Cough, unspecified: Secondary | ICD-10-CM

## 2019-10-22 DIAGNOSIS — R002 Palpitations: Secondary | ICD-10-CM

## 2019-10-22 DIAGNOSIS — R0600 Dyspnea, unspecified: Secondary | ICD-10-CM | POA: Diagnosis not present

## 2019-10-22 DIAGNOSIS — R0609 Other forms of dyspnea: Secondary | ICD-10-CM

## 2019-10-22 MED ORDER — DILTIAZEM HCL ER COATED BEADS 240 MG PO CP24: 240.0000 mg | ORAL_CAPSULE | Freq: Every day | ORAL | 3 refills | Status: DC

## 2019-10-22 MED ORDER — GABAPENTIN 300 MG PO CAPS: 300.0000 mg | ORAL_CAPSULE | Freq: Two times a day (BID) | ORAL | 3 refills | Status: DC

## 2019-10-22 NOTE — Patient Instructions (Signed)
Medication Instructions:  DECREASE your gabapentin to TWICE A DAY *If you need a refill on your cardiac medications before your next appointment, please call your pharmacy*   Lab Work: None ordered If you have labs (blood work) drawn today and your tests are completely normal, you will receive your results only by: Marland Kitchen MyChart Message (if you have MyChart) OR . A paper copy in the mail If you have any lab test that is abnormal or we need to change your treatment, we will call you to review the results.   Testing/Procedures: None ordered   Follow-Up: At [facility], you and your health needs are our priority.  As part of our continuing mission to provide you with exceptional heart care, we have created designated Provider Care Teams.  These Care Teams include your primary Cardiologist (physician) and Advanced Practice Providers (APPs -  Physician Assistants and Nurse Practitioners) who all work together to provide you with the care you need, when you need it.  We recommend signing up for the patient portal called "MyChart".  Sign up information is provided on this After Visit Summary.  MyChart is used to connect with patients for Virtual Visits (Telemedicine).  Patients are able to view lab/test results, encounter notes, upcoming appointments, etc.  Non-urgent messages can be sent to your provider as well.   To learn more about what you can do with MyChart, go to [email or web address].    Your next appointment:   6 month(s)  The format for your next appointment:   In Person  Provider:   Minus Breeding, MD   Other Instructions None

## 2019-10-22 NOTE — Progress Notes (Signed)
Daily Session Note  Patient Details  Name: Cassie [name] MRN: [id number] Date of Birth: 07/24/1970 Referring Provider:     Pulmonary Rehab from 09/10/2019 in [facility] Cardiac and Pulmonary Rehab  Referring Provider Marshell Garfinkel MD      Encounter Date: 10/22/2019  Check In:  Session Check In - 10/22/19 1723      Check-In   Supervising physician immediately available to respond to emergencies See telemetry face sheet for immediately available ER MD    Location [facility]-Cardiac & Pulmonary Rehab    Staff Present Renita Papa, RN BSN;Joseph [address] [place], [place], ACSM CEP, Exercise Physiologist    Virtual Visit No    Medication changes reported     No    Fall or balance concerns reported    No    Warm-up and Cool-down Performed on first and last piece of equipment    Resistance Training Performed Yes    VAD Patient? No    PAD/SET Patient? No      Pain Assessment   Currently in Pain? No/denies              Social History   Tobacco Use  Smoking Status Former Smoker  . Packs/day: 0.10  . Years: 5.00  . Pack years: 0.50  . Types: Cigarettes  . Quit date: 02/21/2007  . Years since quitting: 12.6  Smokeless Tobacco Never Used  Tobacco Comment   social smoker    Goals Met:  Independence with exercise equipment Exercise tolerated well No report of cardiac concerns or symptoms Strength training completed today  Goals Unmet:  Not Applicable  Comments: Pt able to follow exercise prescription today without complaint.  Will continue to monitor for progression.    Dr. Emily Filbert is Medical Director for [place] and [facility].

## 2019-10-26 ENCOUNTER — Other Ambulatory Visit: Payer: Self-pay

## 2019-10-26 DIAGNOSIS — R0600 Dyspnea, unspecified: Secondary | ICD-10-CM | POA: Diagnosis not present

## 2019-10-26 DIAGNOSIS — R0609 Other forms of dyspnea: Secondary | ICD-10-CM

## 2019-10-26 NOTE — Progress Notes (Signed)
Daily Session Note  Patient Details  Name: Cassie [name] MRN: [id number] Date of Birth: [birth year] Referring Provider:     Pulmonary Rehab from 09/10/2019 in [facility] Cardiac and Pulmonary Rehab  Referring Provider Marshell Garfinkel MD      Encounter Date: 10/26/2019  Check In:  Session Check In - 10/26/19 1731      Check-In   Supervising physician immediately available to respond to emergencies See telemetry face sheet for immediately available ER MD    Location [facility]-Cardiac & Pulmonary Rehab    Staff Present Birdie Sons, MPA, RN;Meredith Sherryll Burger, RN Margurite Auerbach, MS Exercise Physiologist    Virtual Visit No    Medication changes reported     No    Fall or balance concerns reported    No    Warm-up and Cool-down Performed on first and last piece of equipment    Resistance Training Performed Yes    VAD Patient? No    PAD/SET Patient? No              Social History   Tobacco Use  Smoking Status Former Smoker  . Packs/day: 0.10  . Years: 5.00  . Pack years: 0.50  . Types: Cigarettes  . Quit date: 02/21/2007  . Years since quitting: 12.6  Smokeless Tobacco Never Used  Tobacco Comment   social smoker    Goals Met:  Independence with exercise equipment Exercise tolerated well No report of cardiac concerns or symptoms Strength training completed today  Goals Unmet:  Not Applicable  Comments: Pt able to follow exercise prescription today without complaint.  Will continue to monitor for progression.   Dr. Emily Filbert is Medical Director for [place] and [facility].

## 2019-10-28 ENCOUNTER — Other Ambulatory Visit: Payer: Self-pay

## 2019-10-28 DIAGNOSIS — R0600 Dyspnea, unspecified: Secondary | ICD-10-CM

## 2019-10-28 DIAGNOSIS — R0609 Other forms of dyspnea: Secondary | ICD-10-CM

## 2019-10-28 NOTE — Progress Notes (Signed)
Daily Session Note  Patient Details  Name: Cassie [name] MRN: [id number] Date of Birth: [birth year] Referring Provider:     Pulmonary Rehab from 09/10/2019 in [facility] Cardiac and Pulmonary Rehab  Referring Provider Marshell Garfinkel MD      Encounter Date: 10/28/2019  Check In:  Session Check In - 10/28/19 1724      Check-In   Supervising physician immediately available to respond to emergencies See telemetry face sheet for immediately available ER MD    Location [facility]-Cardiac & Pulmonary Rehab    Staff Present Birdie Sons, MPA, RN;Joseph Lou [place], [place] Exercise Physiologist    Virtual Visit No    Medication changes reported     No    Fall or balance concerns reported    No    Warm-up and Cool-down Performed on first and last piece of equipment    Resistance Training Performed Yes    VAD Patient? No    PAD/SET Patient? No      Pain Assessment   Currently in Pain? No/denies              Social History   Tobacco Use  Smoking Status Former Smoker  . Packs/day: 0.10  . Years: 5.00  . Pack years: 0.50  . Types: Cigarettes  . Quit date: 02/21/2007  . Years since quitting: 12.6  Smokeless Tobacco Never Used  Tobacco Comment   social smoker    Goals Met:  Independence with exercise equipment Exercise tolerated well No report of cardiac concerns or symptoms Strength training completed today  Goals Unmet:  Not Applicable  Comments: Pt able to follow exercise prescription today without complaint.  Will continue to monitor for progression.    Dr. Emily Filbert is Medical Director for [place] and [facility].

## 2019-11-02 ENCOUNTER — Encounter: Payer: BC Managed Care – PPO | Attending: Pulmonary Disease

## 2019-11-02 ENCOUNTER — Other Ambulatory Visit: Payer: Self-pay

## 2019-11-02 DIAGNOSIS — R0600 Dyspnea, unspecified: Secondary | ICD-10-CM | POA: Insufficient documentation

## 2019-11-02 DIAGNOSIS — R0609 Other forms of dyspnea: Secondary | ICD-10-CM

## 2019-11-02 NOTE — Progress Notes (Signed)
Daily Session Note  Patient Details  Name: Cassie [name] MRN: [id number] Date of Birth: June 09, 1970 Referring Provider:     Pulmonary Rehab from 09/10/2019 in [facility] Cardiac and Pulmonary Rehab  Referring Provider Marshell Garfinkel MD      Encounter Date: 11/02/2019  Check In:  Session Check In - 11/02/19 [place]      Check-In   Supervising physician immediately available to respond to emergencies See telemetry face sheet for immediately available ER MD    Location [facility]-Cardiac & Pulmonary Rehab    Staff Present Birdie Sons, MPA, Mauricia Area, BS, ACSM CEP, Exercise Physiologist;Kara Eliezer Bottom, MS Exercise Physiologist    Virtual Visit No    Medication changes reported     No    Fall or balance concerns reported    No    Warm-up and Cool-down Performed on first and last piece of equipment    Resistance Training Performed Yes    VAD Patient? No    PAD/SET Patient? No      Pain Assessment   Currently in Pain? No/denies              Social History   Tobacco Use  Smoking Status Former Smoker  . Packs/day: 0.10  . Years: 5.00  . Pack years: 0.50  . Types: Cigarettes  . Quit date: 02/21/2007  . Years since quitting: 12.7  Smokeless Tobacco Never Used  Tobacco Comment   social smoker    Goals Met:  Independence with exercise equipment Exercise tolerated well No report of cardiac concerns or symptoms Strength training completed today  Goals Unmet:  Not Applicable  Comments: Pt able to follow exercise prescription today without complaint.  Will continue to monitor for progression.    Dr. Emily Filbert is Medical Director for [place] and [facility].

## 2019-11-04 ENCOUNTER — Other Ambulatory Visit: Payer: Self-pay

## 2019-11-04 ENCOUNTER — Encounter: Payer: Self-pay | Admitting: *Deleted

## 2019-11-04 DIAGNOSIS — R0600 Dyspnea, unspecified: Secondary | ICD-10-CM

## 2019-11-04 DIAGNOSIS — R0609 Other forms of dyspnea: Secondary | ICD-10-CM

## 2019-11-04 NOTE — Progress Notes (Signed)
Daily Session Note  Patient Details  Name: Cassie [name] MRN: [id number] Date of Birth: 04-20-1970 Referring Provider:     Pulmonary Rehab from 09/10/2019 in [facility] Cardiac and Pulmonary Rehab  Referring Provider Marshell Garfinkel MD      Encounter Date: 11/04/2019  Check In:  Session Check In - 11/04/19 1711      Check-In   Supervising physician immediately available to respond to emergencies See telemetry face sheet for immediately available ER MD    Location [facility]-Cardiac & Pulmonary Rehab    Staff Present Birdie Sons, MPA, RN;Meredith Sherryll Burger, RN Margurite Auerbach, MS Exercise Physiologist    Virtual Visit No    Medication changes reported     No    Fall or balance concerns reported    No    Warm-up and Cool-down Performed on first and last piece of equipment    Resistance Training Performed Yes    VAD Patient? No    PAD/SET Patient? No      Pain Assessment   Currently in Pain? No/denies              Social History   Tobacco Use  Smoking Status Former Smoker  . Packs/day: 0.10  . Years: 5.00  . Pack years: 0.50  . Types: Cigarettes  . Quit date: 02/21/2007  . Years since quitting: 12.7  Smokeless Tobacco Never Used  Tobacco Comment   social smoker    Goals Met:  Independence with exercise equipment Exercise tolerated well No report of cardiac concerns or symptoms Strength training completed today  Goals Unmet:  Not Applicable  Comments: Pt able to follow exercise prescription today without complaint.  Will continue to monitor for progression.    Dr. Emily Filbert is Medical Director for [place] and [facility].

## 2019-11-04 NOTE — Progress Notes (Signed)
Pulmonary Individual Treatment Plan  Patient Details  Name: Cassie [name] MRN: [id number] Date of Birth: 08/09/1970 Referring Provider:     Pulmonary Rehab from 09/10/2019 in [facility] Cardiac and Pulmonary Rehab  Referring Provider Marshell Garfinkel MD      Initial Encounter Date:    Pulmonary Rehab from 09/10/2019 in [facility] Cardiac and Pulmonary Rehab  Date 09/10/19      Visit Diagnosis: DOE (dyspnea on exertion)  Patient's Home Medications on Admission:  Current Outpatient Medications:  .  albuterol (PROVENTIL) (2.5 MG/3ML) 0.083% nebulizer solution, Take 3 mLs (2.5 mg total) by nebulization every 6 (six) hours as needed for wheezing or shortness of breath., Disp: 150 mL, Rfl: 0 .  amitriptyline (ELAVIL) 50 MG tablet, Take 50 mg by mouth at bedtime., Disp: , Rfl:  .  Biotin 5 MG CAPS, Take 1 capsule (5 mg total) by mouth daily., Disp: , Rfl: 0 .  chlorpheniramine (CHLOR-TRIMETON) 4 MG tablet, Take by mouth. 4 times daily, Disp: , Rfl:  .  cholecalciferol (VITAMIN D3) 25 MCG (1000 UNIT) tablet, Take 1,000 Units by mouth daily., Disp: , Rfl:  .  Dextromethorphan Polistirex (DELSYM PO), Take by mouth daily., Disp: , Rfl:  .  diltiazem (CARDIZEM CD) 240 MG 24 hr capsule, Take 1 capsule (240 mg total) by mouth daily., Disp: 90 capsule, Rfl: 3 .  famotidine (PEPCID) 20 MG tablet, One after supper, Disp: 30 tablet, Rfl: 11 .  gabapentin (NEURONTIN) 300 MG capsule, Take 1 capsule (300 mg total) by mouth 2 (two) times daily., Disp: 180 capsule, Rfl: 3 .  ipratropium (ATROVENT) 0.02 % nebulizer solution, Inhale into the lungs., Disp: , Rfl:  .  pantoprazole (PROTONIX) 40 MG tablet, Take 1 tablet (40 mg total) by mouth daily. Take 30-60 min before first meal of the day, Disp: 30 tablet, Rfl: 2 .  Respiratory Therapy Supplies (FLUTTER) DEVI, Use as directed, Disp: 1 each, Rfl: 0  Past Medical History: Past Medical History:  Diagnosis Date  . Allergy   . Asthma   . Cardiac disease   . Diffuse  cystic mastopathy   . GERD (gastroesophageal reflux disease)   . Guttate psoriasis   . H/O syncope    cards work-up WNL (Hochrein) thought 2/2 overmedication vs arrhythmias  . Hx of endometriosis    hysterectomy  . Hx of migraines    occasional; improved since hysterectomy  . Labral tear of shoulder 02/2014   R, pending surgery Tamera Punt)  . Rosacea    mild    Tobacco Use: Social History   Tobacco Use  Smoking Status Former Smoker  . Packs/day: 0.10  . Years: 5.00  . Pack years: 0.50  . Types: Cigarettes  . Quit date: 02/21/2007  . Years since quitting: 12.7  Smokeless Tobacco Never Used  Tobacco Comment   social smoker    Labs: Recent Review Flowsheet Data    Labs for ITP Cardiac and Pulmonary Rehab Latest Ref Rng & Units 08/12/2009 02/23/2011 02/16/2016 12/13/2017   Cholestrol 0 - 200 mg/dL 168 160 158 174   LDLCALC 0 - 99 mg/dL 89 88 82 94   HDL >39.00 mg/dL 41.60 50.60 50 59.10   Trlycerides 0 - 149 mg/dL 187.0(H) 106.0 128 108.0       Pulmonary Assessment Scores:  Pulmonary Assessment Scores    Row Name 09/03/19 1051 09/10/19 1024       ADL UCSD   ADL Phase Entry Entry    SOB Score total 57 57  Rest 1 1    Walk 2 2    Stairs 4 4    Bath 2 2    Dress 2 2    Shop 3 3      CAT Score   CAT Score 25 25      mMRC Score   mMRC Score -- 3           UCSD: Self-administered rating of dyspnea associated with activities of daily living (ADLs) 6-point scale (0 = "not at all" to 5 = "maximal or unable to do because of breathlessness")  Scoring Scores range from 0 to 120.  Minimally important difference is 5 units  CAT: CAT can identify the health impairment of COPD patients and is better correlated with disease progression.  CAT has a scoring range of zero to 40. The CAT score is classified into four groups of low (less than 10), medium (10 - 20), high (21-30) and very high (31-40) based on the impact level of disease on health status. A CAT score over 10  suggests significant symptoms.  A worsening CAT score could be explained by an exacerbation, poor medication adherence, poor inhaler technique, or progression of COPD or comorbid conditions.  CAT MCID is 2 points  mMRC: mMRC (Modified Medical Research Council) Dyspnea Scale is used to assess the degree of baseline functional disability in patients of respiratory disease due to dyspnea. No minimal important difference is established. A decrease in score of 1 point or greater is considered a positive change.   Pulmonary Function Assessment:   Exercise Target Goals: Exercise Program Goal: Individual exercise prescription set using results from initial 6 min walk test and THRR while considering  patient's activity barriers and safety.   Exercise Prescription Goal: Initial exercise prescription builds to 30-45 minutes a day of aerobic activity, 2-3 days per week.  Home exercise guidelines will be given to patient during program as part of exercise prescription that the participant will acknowledge.  Education: Aerobic Exercise & Resistance Training: - Gives group verbal and written instruction on the various components of exercise. Focuses on aerobic and resistive training programs and the benefits of this training and how to safely progress through these programs..   Education: Exercise & Equipment Safety: - Individual verbal instruction and demonstration of equipment use and safety with use of the equipment.   Pulmonary Rehab from 09/30/2019 in [facility] Cardiac and Pulmonary Rehab  Date 09/10/19  Educator [facility]  Instruction Review Code 1- Verbalizes Understanding      Education: Exercise Physiology & General Exercise Guidelines: - Group verbal and written instruction with models to review the exercise physiology of the cardiovascular system and associated critical values. Provides general exercise guidelines with specific guidelines to those with heart or lung disease.    Education:  Flexibility, Balance, Mind/Body Relaxation: Provides group verbal/written instruction on the benefits of flexibility and balance training, including mind/body exercise modes such as yoga, pilates and tai chi.  Demonstration and skill practice provided.   Activity Barriers & Risk Stratification:  Activity Barriers & Cardiac Risk Stratification - 09/10/19 0915      Activity Barriers & Cardiac Risk Stratification   Activity Barriers Deconditioning;Shortness of Breath;History of Falls;Muscular Weakness           6 Minute Walk:  6 Minute Walk    Row Name 09/10/19 0913         6 Minute Walk   Phase Initial     Distance 1163 feet  Walk Time 6 minutes     # of Rest Breaks 0     MPH 2.2     METS 3.96     RPE 14     Perceived Dyspnea  2     VO2 Peak 13.86     Symptoms Yes (comment)     Comments SOB, coughing     Resting HR 109 bpm     Resting BP 118/56     Resting Oxygen Saturation  97 %     Exercise Oxygen Saturation  during 6 min walk 95 %     Max Ex. HR 125 bpm     Max Ex. BP 144/74     2 Minute Post BP 132/70       Interval HR   1 Minute HR 123     2 Minute HR 120     3 Minute HR 118     4 Minute HR 116     5 Minute HR 118     6 Minute HR 125     2 Minute Post HR 111     Interval Heart Rate? Yes       Interval Oxygen   Interval Oxygen? Yes     Baseline Oxygen Saturation % 97 %     1 Minute Oxygen Saturation % 96 %     1 Minute Liters of Oxygen 0 L  Room Air     2 Minute Oxygen Saturation % 95 %     2 Minute Liters of Oxygen 0 L     3 Minute Oxygen Saturation % 95 %     3 Minute Liters of Oxygen 0 L     4 Minute Oxygen Saturation % 96 %     4 Minute Liters of Oxygen 0 L     5 Minute Oxygen Saturation % 96 %     5 Minute Liters of Oxygen 0 L     6 Minute Oxygen Saturation % 96 %     6 Minute Liters of Oxygen 0 L     2 Minute Post Oxygen Saturation % 96 %     2 Minute Post Liters of Oxygen 0 L           Oxygen Initial Assessment:  Oxygen Initial  Assessment - 09/03/19 1051      Home Oxygen   Home Oxygen Device None    Sleep Oxygen Prescription None    Home Exercise Oxygen Prescription None    Home Resting Oxygen Prescription None      Initial 6 min Walk   Oxygen Used None      Program Oxygen Prescription   Program Oxygen Prescription None      Intervention   Short Term Goals To learn and understand importance of monitoring SPO2 with pulse oximeter and demonstrate accurate use of the pulse oximeter.;To learn and understand importance of maintaining oxygen saturations>88%;To learn and demonstrate proper pursed lip breathing techniques or other breathing techniques.;To learn and demonstrate proper use of respiratory medications    Long  Term Goals Verbalizes importance of monitoring SPO2 with pulse oximeter and return demonstration;Maintenance of O2 saturations>88%;Exhibits proper breathing techniques, such as pursed lip breathing or other method taught during program session;Compliance with respiratory medication;Demonstrates proper use of MDI's           Oxygen Re-Evaluation:  Oxygen Re-Evaluation    Row Name 09/14/19 1722 10/21/19 1723           Program Oxygen  Prescription   Program Oxygen Prescription None None        Home Oxygen   Home Oxygen Device None None      Sleep Oxygen Prescription None None      Home Exercise Oxygen Prescription None None      Home Resting Oxygen Prescription None None        Goals/Expected Outcomes   Short Term Goals To learn and understand importance of monitoring SPO2 with pulse oximeter and demonstrate accurate use of the pulse oximeter.;To learn and understand importance of maintaining oxygen saturations>88%;To learn and demonstrate proper pursed lip breathing techniques or other breathing techniques.;To learn and demonstrate proper use of respiratory medications To learn and understand importance of maintaining oxygen saturations>88%;To learn and understand importance of monitoring  SPO2 with pulse oximeter and demonstrate accurate use of the pulse oximeter.      Long  Term Goals Verbalizes importance of monitoring SPO2 with pulse oximeter and return demonstration;Maintenance of O2 saturations>88%;Exhibits proper breathing techniques, such as pursed lip breathing or other method taught during program session;Compliance with respiratory medication;Demonstrates proper use of MDI's Verbalizes importance of monitoring SPO2 with pulse oximeter and return demonstration;Maintenance of O2 saturations>88%      Comments Reviewed PLB technique with pt.  Talked about how it works and it's importance in maintaining their exercise saturations. She has a pulse oximeter to check her oxygen saturation at home. Informed her why it is important to have one. Reviewed that oxygen saturations should be 88 percent and above. She checks her oxygen at home if she is having breathing issues. She knows to be 88 percent and above when she is exercising.      Goals/Expected Outcomes Short: Become more profiecient at using PLB.   Long: Become independent at using PLB. Short: monitor oxygen at home with exertion. Long: maintain oxygen saturations above 88 percent independently.             Oxygen Discharge (Final Oxygen Re-Evaluation):  Oxygen Re-Evaluation - 10/21/19 1723      Program Oxygen Prescription   Program Oxygen Prescription None      Home Oxygen   Home Oxygen Device None    Sleep Oxygen Prescription None    Home Exercise Oxygen Prescription None    Home Resting Oxygen Prescription None      Goals/Expected Outcomes   Short Term Goals To learn and understand importance of maintaining oxygen saturations>88%;To learn and understand importance of monitoring SPO2 with pulse oximeter and demonstrate accurate use of the pulse oximeter.    Long  Term Goals Verbalizes importance of monitoring SPO2 with pulse oximeter and return demonstration;Maintenance of O2 saturations>88%    Comments She has a  pulse oximeter to check her oxygen saturation at home. Informed her why it is important to have one. Reviewed that oxygen saturations should be 88 percent and above. She checks her oxygen at home if she is having breathing issues. She knows to be 88 percent and above when she is exercising.    Goals/Expected Outcomes Short: monitor oxygen at home with exertion. Long: maintain oxygen saturations above 88 percent independently.           Initial Exercise Prescription:  Initial Exercise Prescription - 09/10/19 0900      Date of Initial Exercise RX and Referring Provider   Date 09/10/19    Referring Provider Marshell Garfinkel MD      Treadmill   MPH 2.2    Grade 1    Minutes 15  METs 2.99      REL-XR   Level 1    Speed 50    Minutes 15    METs 2      T5 Nustep   Level 2    SPM 80    Minutes 15    METs 2      Prescription Details   Frequency (times per week) 3    Duration Progress to 30 minutes of continuous aerobic without signs/symptoms of physical distress      Intensity   THRR 40-80% of Max Heartrate 135-161    Ratings of Perceived Exertion 11-13    Perceived Dyspnea 0-4      Progression   Progression Continue to progress workloads to maintain intensity without signs/symptoms of physical distress.      Resistance Training   Training Prescription Yes    Weight 3 lb    Reps 10-15           Perform Capillary Blood Glucose checks as needed.  Exercise Prescription Changes:  Exercise Prescription Changes    Row Name 09/10/19 0900 09/16/19 1000 09/21/19 1700 09/29/19 0800 10/14/19 1000     Response to Exercise   Blood Pressure (Admit) 118/56 104/72 -- 122/70 126/74   Blood Pressure (Exercise) 144/74 120/70 -- 130/72 128/72   Blood Pressure (Exit) 124/70 122/70 -- 112/68 122/82   Heart Rate (Admit) 109 bpm 107 bpm -- 103 bpm 102 bpm   Heart Rate (Exercise) 125 bpm 114 bpm -- 116 bpm 115 bpm   Heart Rate (Exit) 114 bpm 100 bpm -- 100 bpm 115 bpm   Oxygen  Saturation (Admit) 97 % 97 % -- 98 % 98 %   Oxygen Saturation (Exercise) 95 % 96 % -- 97 % 96 %   Oxygen Saturation (Exit) 96 % 97 % -- 97 % 95 %   Rating of Perceived Exertion (Exercise) 14 13 -- 15 14   Perceived Dyspnea (Exercise) 2 2 -- 2 2   Symptoms SOB, coughing SOB, coughing -- -- SOB   Comments walk test results first full day of exercise -- -- --   Duration -- Progress to 30 minutes of  aerobic without signs/symptoms of physical distress Progress to 30 minutes of  aerobic without signs/symptoms of physical distress Progress to 30 minutes of  aerobic without signs/symptoms of physical distress Continue with 30 min of aerobic exercise without signs/symptoms of physical distress.   Intensity -- THRR unchanged THRR unchanged THRR unchanged THRR unchanged     Progression   Progression -- Continue to progress workloads to maintain intensity without signs/symptoms of physical distress. Continue to progress workloads to maintain intensity without signs/symptoms of physical distress. Continue to progress workloads to maintain intensity without signs/symptoms of physical distress. Continue to progress workloads to maintain intensity without signs/symptoms of physical distress.   Average METs -- 2.32 2.32 2.96 3.9     Resistance Training   Training Prescription -- Yes Yes Yes Yes   Weight -- 3 lb 3 lb 3 lb 3 lb   Reps -- 10-15 10-15 10-15 10-15     Interval Training   Interval Training -- No No No No     Treadmill   MPH -- 2.2 2.2 2.5 2.7   Grade -- 0.5 0.5 2 2.5   Minutes -- $RemoveBe'15 15 15 15   'roniNgwaK$ METs -- 2.84 2.84 3.6 4     REL-XR   Level -- -- -- 2 4   Minutes -- -- -- 15  15   METs -- -- -- 2.6 3.8     T5 Nustep   Level -- $Remove'2 2 2 'TPwpVaX$ --   Minutes -- $RemoveBe'15 15 15 'AZtvYNJjH$ --   METs -- 1.8 1.8 1.9 --     Home Exercise Plan   Plans to continue exercise at -- -- [employer] (comment)  Exercising 2 days/wk with [facility] study on off days of rehab [employer] (comment)  Exercising 2 days/wk with  [facility] study on off days of rehab [employer] (comment)  Exercising 2 days/wk with [facility] study on off days of rehab   Frequency -- -- Add 2 additional days to program exercise sessions. Add 2 additional days to program exercise sessions. Add 2 additional days to program exercise sessions.   Initial Home Exercises Provided -- -- 09/21/19 09/21/19 09/21/19   Row Name 10/29/19 0900             Response to Exercise   Blood Pressure (Admit) 110/72       Blood Pressure (Exercise) 142/60       Blood Pressure (Exit) 108/66       Heart Rate (Admit) 104 bpm       Heart Rate (Exercise) 117 bpm       Heart Rate (Exit) 112 bpm       Oxygen Saturation (Admit) 98 %       Oxygen Saturation (Exercise) 97 %       Oxygen Saturation (Exit) 97 %       Rating of Perceived Exertion (Exercise) 14       Perceived Dyspnea (Exercise) 2       Duration Continue with 30 min of aerobic exercise without signs/symptoms of physical distress.       Intensity THRR unchanged         Progression   Progression Continue to progress workloads to maintain intensity without signs/symptoms of physical distress.       Average METs 4         Resistance Training   Training Prescription Yes       Weight 6 lb       Reps 10-15         Interval Training   Interval Training No         Treadmill   MPH 2.7       Grade 2       Minutes 15       METs 4         REL-XR   Level 4       Minutes 15       METs 4              Exercise Comments:   Exercise Goals and Review:  Exercise Goals    Row Name 09/10/19 0916             Exercise Goals   Increase Physical Activity Yes       Intervention Provide advice, education, support and counseling about physical activity/exercise needs.;Develop an individualized exercise prescription for aerobic and resistive training based on initial evaluation findings, risk stratification, comorbidities and participant's personal goals.       Expected Outcomes Short Term:  Attend rehab on a regular basis to increase amount of physical activity.;Long Term: Add in home exercise to make exercise part of routine and to increase amount of physical activity.;Long Term: Exercising regularly at least 3-5 days a week.       Increase Strength  and Stamina Yes       Intervention Provide advice, education, support and counseling about physical activity/exercise needs.;Develop an individualized exercise prescription for aerobic and resistive training based on initial evaluation findings, risk stratification, comorbidities and participant's personal goals.       Expected Outcomes Short Term: Increase workloads from initial exercise prescription for resistance, speed, and METs.;Short Term: Perform resistance training exercises routinely during rehab and add in resistance training at home;Long Term: Improve cardiorespiratory fitness, muscular endurance and strength as measured by increased METs and functional capacity (6MWT)       Able to understand and use rate of perceived exertion (RPE) scale Yes       Intervention Provide education and explanation on how to use RPE scale       Expected Outcomes Short Term: Able to use RPE daily in rehab to express subjective intensity level;Long Term:  Able to use RPE to guide intensity level when exercising independently       Able to understand and use Dyspnea scale Yes       Intervention Provide education and explanation on how to use Dyspnea scale       Expected Outcomes Short Term: Able to use Dyspnea scale daily in rehab to express subjective sense of shortness of breath during exertion;Long Term: Able to use Dyspnea scale to guide intensity level when exercising independently       Knowledge and understanding of Target Heart Rate Range (THRR) Yes       Intervention Provide education and explanation of THRR including how the numbers were predicted and where they are located for reference       Expected Outcomes Short Term: Able to state/look up  THRR;Short Term: Able to use daily as guideline for intensity in rehab;Long Term: Able to use THRR to govern intensity when exercising independently       Able to check pulse independently Yes       Intervention Provide education and demonstration on how to check pulse in carotid and radial arteries.;Review the importance of being able to check your own pulse for safety during independent exercise       Expected Outcomes Short Term: Able to explain why pulse checking is important during independent exercise;Long Term: Able to check pulse independently and accurately       Understanding of Exercise Prescription Yes       Intervention Provide education, explanation, and written materials on patient's individual exercise prescription       Expected Outcomes Short Term: Able to explain program exercise prescription;Long Term: Able to explain home exercise prescription to exercise independently              Exercise Goals Re-Evaluation :  Exercise Goals Re-Evaluation    Row Name 09/14/19 1720 09/21/19 1737 09/29/19 0851 10/14/19 1011 10/21/19 1734     Exercise Goal Re-Evaluation   Exercise Goals Review Increase Physical Activity;Able to understand and use rate of perceived exertion (RPE) scale;Knowledge and understanding of Target Heart Rate Range (THRR);Understanding of Exercise Prescription;Increase Strength and Stamina;Able to understand and use Dyspnea scale;Able to check pulse independently Increase Physical Activity;Able to understand and use rate of perceived exertion (RPE) scale;Knowledge and understanding of Target Heart Rate Range (THRR);Understanding of Exercise Prescription;Increase Strength and Stamina;Able to understand and use Dyspnea scale;Able to check pulse independently Increase Physical Activity;Able to understand and use rate of perceived exertion (RPE) scale;Knowledge and understanding of Target Heart Rate Range (THRR);Understanding of Exercise Prescription;Increase Strength and  Stamina;Able to understand  and use Dyspnea scale;Able to check pulse independently Increase Physical Activity;Increase Strength and Stamina;Understanding of Exercise Prescription Increase Physical Activity;Increase Strength and Stamina   Comments Reviewed RPE and dyspnea scales, THR and program prescription with pt today.  Pt voiced understanding and was given a copy of goals to take home. Reviewed home exercise with pt today.  Pt plans to exercise 2 days a week with [facility] study of off days of rehab for exercise.  Reviewed THR, pulse, RPE, sign and symptoms, pulse oximetery and when to call 911 or MD.  Also discussed weather considerations and indoor options.  Pt voiced understanding. Genae is doing well in rehab. She has already increased her treadmill speed to 2.5 and incline to 2%. She is adjusting well to the exercise machines. O2 sats are maintained well. Will continue to monitor progress. Anny has continued to do well in rehab.  She now on level 4 for the XR and up to 2.7 mph on the treadmill.  We will continue to monitor her progress. Diara is going to start another PT program study for COVID 19. Her classes start in November so she will go there on her off days from [place].   Expected Outcomes Short: Use RPE daily to regulate intensity. Long: Follow program prescription in THR. Short: Continue to attend pulmonary rehab with consistent attendence and [facility] study on off days of rehab. Long: Improve SOB and be able to maintain an idependent exercise program following Pulmonary Rehab. Short: Continue to increase workloads Long: Progress overall MET level/ increase strength and stamina Short: Try 4 lb hand weights  Long: Continue to improve stamina Short: Start PT at [place]. Long: Maintain exercise at [facility] and PT at rehab.   What Cheer Name 10/29/19 0906             Exercise Goal Re-Evaluation   Exercise Goals Review Increase Physical Activity;Increase Strength and Stamina       Comments Gema is  progressing well and averaging 4 METS and oxygen levels stay in high 90s.  She uses 6 lb weights for strength work.  Staff will monitor progress.       Expected Outcomes Short:  continue to exercise consistently Long:  build stamina and increase MET level              Discharge Exercise Prescription (Final Exercise Prescription Changes):  Exercise Prescription Changes - 10/29/19 0900      Response to Exercise   Blood Pressure (Admit) 110/72    Blood Pressure (Exercise) 142/60    Blood Pressure (Exit) 108/66    Heart Rate (Admit) 104 bpm    Heart Rate (Exercise) 117 bpm    Heart Rate (Exit) 112 bpm    Oxygen Saturation (Admit) 98 %    Oxygen Saturation (Exercise) 97 %    Oxygen Saturation (Exit) 97 %    Rating of Perceived Exertion (Exercise) 14    Perceived Dyspnea (Exercise) 2    Duration Continue with 30 min of aerobic exercise without signs/symptoms of physical distress.    Intensity THRR unchanged      Progression   Progression Continue to progress workloads to maintain intensity without signs/symptoms of physical distress.    Average METs 4      Resistance Training   Training Prescription Yes    Weight 6 lb    Reps 10-15      Interval Training   Interval Training No      Treadmill   MPH 2.7  Grade 2    Minutes 15    METs 4      REL-XR   Level 4    Minutes 15    METs 4           Nutrition:  Target Goals: Understanding of nutrition guidelines, daily intake of sodium '1500mg'$ , cholesterol '200mg'$ , calories 30% from fat and 7% or less from saturated fats, daily to have 5 or more servings of fruits and vegetables.  Education: Controlling Sodium/Reading Food Labels -Group verbal and written material supporting the discussion of sodium use in heart healthy nutrition. Review and explanation with models, verbal and written materials for utilization of the food label.   Education: General Nutrition Guidelines/Fats and Fiber: -Group instruction provided by  verbal, written material, models and posters to present the general guidelines for heart healthy nutrition. Gives an explanation and review of dietary fats and fiber.   Biometrics:  Pre Biometrics - 09/10/19 0918      Pre Biometrics   Height 5' 5.6" (1.666 m)    Weight 206 lb 4.8 oz (93.6 kg)    BMI (Calculated) 33.71    Single Leg Stand 14 seconds            Nutrition Therapy Plan and Nutrition Goals:  Nutrition Therapy & Goals - 09/22/19 1210      Nutrition Therapy   Diet heart healthy, low Na, Pulmonary MNT    Protein (specify units) 75g    Fiber 25 grams    Whole Grain Foods 3 servings    Saturated Fats 12 max. grams    Fruits and Vegetables 5 servings/day    Sodium 1.5 grams      Personal Nutrition Goals   Nutrition Goal ST: Power bowls and buddhas LT: improve ADL, improving fitness, improve energy (changes on time of day, highs of 5/10)    Comments B: cereal or herberlife shake L: sandwich and fruit  - white bread S: granola bar or goldfish D: hello fresh meal, breakfast for dinner, or takeout. Feels SOB when eating and for a while after. Drinks: herbalife tea and water - drinks all day. Discussed heart healthy eating and meal planning. discussed pulmonary MNT.      Intervention Plan   Intervention Prescribe, educate and counsel regarding individualized specific dietary modifications aiming towards targeted core components such as weight, hypertension, lipid management, diabetes, heart failure and other comorbidities.;Nutrition handout(s) given to patient.    Expected Outcomes Short Term Goal: Understand basic principles of dietary content, such as calories, fat, sodium, cholesterol and nutrients.;Short Term Goal: A plan has been developed with personal nutrition goals set during dietitian appointment.;Long Term Goal: Adherence to prescribed nutrition plan.           Nutrition Assessments:  Nutrition Assessments - 09/03/19 1050      MEDFICTS Scores   Pre Score 21            MEDIFICTS Score Key:          ?70 Need to make dietary changes          40-70 Heart Healthy Diet         ? 40 Therapeutic Level Cholesterol Diet  Nutrition Goals Re-Evaluation:  Nutrition Goals Re-Evaluation    [place] Name 10/28/19 1728             Goals   Nutrition Goal ST: Power bowls and buddha bowls for lunch, try whole wheat bread LT: Whole wheat bread 1/2 the time, eat 8 servings of  fruits and vegetables, meet protein and calorie needs       Comment She reports trying to eat healthy at lunch - Ex: leftover chicken, sandwich with cucumbers (white wheat). She also is drining more water. She reports no other changes at this time.       Expected Outcome ST: Power bowls and buddha bowls for lunch, try whole wheat bread LT: Whole wheat bread 1/2 the time, eat 8 servings of fruits and vegetables, meet protein and calorie needs              Nutrition Goals Discharge (Final Nutrition Goals Re-Evaluation):  Nutrition Goals Re-Evaluation - 10/28/19 1728      Goals   Nutrition Goal ST: Power bowls and buddha bowls for lunch, try whole wheat bread LT: Whole wheat bread 1/2 the time, eat 8 servings of fruits and vegetables, meet protein and calorie needs    Comment She reports trying to eat healthy at lunch - Ex: leftover chicken, sandwich with cucumbers (white wheat). She also is drining more water. She reports no other changes at this time.    Expected Outcome ST: Power bowls and buddha bowls for lunch, try whole wheat bread LT: Whole wheat bread 1/2 the time, eat 8 servings of fruits and vegetables, meet protein and calorie needs           Psychosocial: Target Goals: Acknowledge presence or absence of significant depression and/or stress, maximize coping skills, provide positive support system. Participant is able to verbalize types and ability to use techniques and skills needed for reducing stress and depression.   Education: Depression - Provides group verbal and  written instruction on the correlation between heart/lung disease and depressed mood, treatment options, and the stigmas associated with seeking treatment.   Education: Sleep Hygiene -Provides group verbal and written instruction about how sleep can affect your health.  Define sleep hygiene, discuss sleep cycles and impact of sleep habits. Review good sleep hygiene tips.    Education: Stress and Anxiety: - Provides group verbal and written instruction about the health risks of elevated stress and causes of high stress.  Discuss the correlation between heart/lung disease and anxiety and treatment options. Review healthy ways to manage with stress and anxiety.   Initial Review & Psychosocial Screening:  Initial Psych Review & Screening - 09/03/19 1043      Initial Review   Current issues with Current Anxiety/Panic;Current Stress Concerns;Current Sleep Concerns    Source of Stress Concerns Chronic Illness;Family;Unable to participate in former interests or hobbies;Unable to perform yard/household activities    Comments Frustration form not being able to go and do from dealing with COVID recovery over last year, meds help with sleep and cough, mother just diagnosised with breast cancer this month      [place]? Yes   husband, mother and father, kids (daughter just moved to [place])     Barriers   Psychosocial barriers to participate in program There are no identifiable barriers or psychosocial needs.;The patient should benefit from training in stress management and relaxation.;Psychosocial barriers identified (see note)      Screening Interventions   Interventions Encouraged to exercise;Provide feedback about the scores to participant;To provide support and resources with identified psychosocial needs    Expected Outcomes Short Term goal: Utilizing psychosocial counselor, staff and physician to assist with identification of specific Stressors or current issues  interfering with healing process. Setting desired goal for each stressor or current issue identified.;Long Term  Goal: Stressors or current issues are controlled or eliminated.;Short Term goal: Identification and review with participant of any Quality of Life or Depression concerns found by scoring the questionnaire.;Long Term goal: The participant improves quality of Life and PHQ9 Scores as seen by post scores and/or verbalization of changes           Quality of Life Scores:  Scores of 19 and below usually indicate a poorer quality of life in these areas.  A difference of  2-3 points is a clinically meaningful difference.  A difference of 2-3 points in the total score of the Quality of Life Index has been associated with significant improvement in overall quality of life, self-image, physical symptoms, and general health in studies assessing change in quality of life.  PHQ-9: Recent Review Flowsheet Data    Depression screen [facility] 2/9 09/10/2019 01/05/2019 12/20/2017   Decreased Interest 1 0 2   Down, Depressed, Hopeless 0 0 1   PHQ - 2 Score 1 0 3   Altered sleeping 1 - 2   Tired, decreased energy 2 - 2   Change in appetite 0 - 0   Feeling bad or failure about yourself  0 - 0   Trouble concentrating 0 - 0   Moving slowly or fidgety/restless 0 - 0   Suicidal thoughts 0 - 0   PHQ-9 Score 4 - 7   Difficult doing work/chores Somewhat difficult - -     Interpretation of Total Score  Total Score Depression Severity:  1-4 = Minimal depression, 5-9 = Mild depression, 10-14 = Moderate depression, 15-19 = Moderately severe depression, 20-27 = Severe depression   Psychosocial Evaluation and Intervention:  Psychosocial Evaluation - 09/03/19 1052      Psychosocial Evaluation & Interventions   Interventions Stress management education;Encouraged to exercise with the program and follow exercise prescription    Comments 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  She was diganoised  a year ago and is still recovering.  She still is having extreme SOB and hoarseness, and extreme fatigue.  She wants to be able to go into work and get out of the house to be able to do more and breathe better.  She is originally from [place] and went to [place] for school. Her husband works for school and her family is now in [place], [place].  She continues to struggle with some brain fog but it is starting to clear.  This has all really been frustrating for her as she is used to being able to just go and do.  She is hoping to be able to come to class after her work day so that she can continue to work as much as possible.  Her family has also just found out that her mom has breast cancer.  She is looking forward to feeling better.    Expected Outcomes Short: Attend rehab to improve stamina and SOB  Long: Continue to stay positive and    Continue Psychosocial Services  Follow up required by staff           Psychosocial Re-Evaluation:  Psychosocial Re-Evaluation    Row Name 10/21/19 1729             Psychosocial Re-Evaluation   Current issues with Current Stress Concerns       Comments She is stressed that she is not back to normal since she has had COVID. Dulcie is able to keep coming to rehab and it is  improving her mood. Her job has been patient with her and she is working full time at home.       Expected Outcomes Short: Continue to exercise regularly to support mental health and notify staff of any changes. Long: maintain mental health and well being through teaching of rehab or prescribed medications independently.       Interventions Encouraged to attend Pulmonary Rehabilitation for the exercise       Continue Psychosocial Services  Follow up required by staff              Psychosocial Discharge (Final Psychosocial Re-Evaluation):  Psychosocial Re-Evaluation - 10/21/19 1729      Psychosocial Re-Evaluation   Current issues with Current Stress Concerns    Comments She is stressed  that she is not back to normal since she has had COVID. Bronda is able to keep coming to rehab and it is improving her mood. Her job has been patient with her and she is working full time at home.    Expected Outcomes Short: Continue to exercise regularly to support mental health and notify staff of any changes. Long: maintain mental health and well being through teaching of rehab or prescribed medications independently.    Interventions Encouraged to attend Pulmonary Rehabilitation for the exercise    Continue Psychosocial Services  Follow up required by staff           Education: Education Goals: Education classes will be provided on a weekly basis, covering required topics. Participant will state understanding/return demonstration of topics presented.  Learning Barriers/Preferences:  Learning Barriers/Preferences - 09/03/19 1042      Learning Barriers/Preferences   Learning Barriers Sight   glasses to drive   Learning Preferences Skilled Demonstration           General Pulmonary Education Topics:  Infection Prevention: - Provides verbal and written material to individual with discussion of infection control including proper hand washing and proper equipment cleaning during exercise session.   Pulmonary Rehab from 09/30/2019 in [facility] Cardiac and Pulmonary Rehab  Date 09/10/19  Educator [facility]  Instruction Review Code 1- Verbalizes Understanding      Falls Prevention: - Provides verbal and written material to individual with discussion of falls prevention and safety.   Pulmonary Rehab from 09/30/2019 in [facility] Cardiac and Pulmonary Rehab  Date 09/10/19  Educator [facility]  Instruction Review Code 1- Verbalizes Understanding      Chronic Lung Diseases: - Group verbal and written instruction to review updates, respiratory medications, advancements in procedures and treatments. Discuss use of supplemental oxygen including available portable oxygen systems, continuous and intermittent flow  rates, concentrators, personal use and safety guidelines. Review proper use of inhaler and spacers. Provide informative websites for self-education.    Pulmonary Rehab from 09/30/2019 in [facility] Cardiac and Pulmonary Rehab  Date 09/10/19  Instruction Review Code 3- Needs Reinforcement  [need identified]      Energy Conservation: - Provide group verbal and written instruction for methods to conserve energy, plan and organize activities. Instruct on pacing techniques, use of adaptive equipment and posture/positioning to relieve shortness of breath.   Pulmonary Rehab from 09/30/2019 in [facility] Cardiac and Pulmonary Rehab  Date 09/30/19  Educator [facility]  Instruction Review Code 1- Verbalizes Understanding      Triggers and Exacerbations: - Group verbal and written instruction to review types of environmental triggers and ways to prevent exacerbations. Discuss weather changes, air quality and the benefits of nasal washing. Review warning signs and symptoms to  help prevent infections. Discuss techniques for effective airway clearance, coughing, and vibrations.   Pulmonary Rehab from 09/30/2019 in [facility] Cardiac and Pulmonary Rehab  Date 09/30/19  Educator [facility]  Instruction Review Code 1- Verbalizes Understanding      AED/CPR: - Group verbal and written instruction with the use of models to demonstrate the basic use of the AED with the basic ABC's of resuscitation.   Anatomy and Physiology of the Lungs: - Group verbal and written instruction with the use of models to provide basic lung anatomy and physiology related to function, structure and complications of lung disease.   Pulmonary Rehab from 09/30/2019 in [facility] Cardiac and Pulmonary Rehab  Date 09/30/19  Educator [facility]  Instruction Review Code 1- Verbalizes Understanding      Anatomy & Physiology of the Heart: - Group verbal and written instruction and models provide basic cardiac anatomy and physiology, with the coronary electrical and arterial  systems. Review of Valvular disease and Heart Failure   Cardiac Medications: - Group verbal and written instruction to review commonly prescribed medications for heart disease. Reviews the medication, class of the drug, and side effects.   Pulmonary Rehab from 09/30/2019 in [facility] Cardiac and Pulmonary Rehab  Date 09/16/19  Educator sb  Instruction Review Code 1- Verbalizes Understanding      Other: -Provides group and verbal instruction on various topics (see comments)   Knowledge Questionnaire Score:    Core Components/Risk Factors/Patient Goals at Admission:  Personal Goals and Risk Factors at Admission - 09/10/19 0918      Core Components/Risk Factors/Patient Goals on Admission    Weight Management Yes;Obesity;Weight Loss    Intervention Weight Management: Develop a combined nutrition and exercise program designed to reach desired caloric intake, while maintaining appropriate intake of nutrient and fiber, sodium and fats, and appropriate energy expenditure required for the weight goal.;Weight Management: Provide education and appropriate resources to help participant work on and attain dietary goals.;Weight Management/Obesity: Establish reasonable short term and long term weight goals.;Obesity: Provide education and appropriate resources to help participant work on and attain dietary goals.    Admit Weight 206 lb 4.8 oz (93.6 kg)    Goal Weight: Short Term 200 lb (90.7 kg)    Goal Weight: Long Term 195 lb (88.5 kg)    Expected Outcomes Short Term: Continue to assess and modify interventions until short term weight is achieved;Long Term: Adherence to nutrition and physical activity/exercise program aimed toward attainment of established weight goal;Weight Loss: Understanding of general recommendations for a balanced deficit meal plan, which promotes 1-2 lb weight loss per week and includes a negative energy balance of [phone] kcal/d;Understanding recommendations for meals to include  15-35% energy as protein, 25-35% energy from fat, 35-60% energy from carbohydrates, less than $RemoveB'200mg'dAnqopPO$  of dietary cholesterol, 20-35 gm of total fiber daily;Understanding of distribution of calorie intake throughout the day with the consumption of 4-5 meals/snacks    Improve shortness of breath with ADL's Yes    Intervention Provide education, individualized exercise plan and daily activity instruction to help decrease symptoms of SOB with activities of daily living.    Expected Outcomes Short Term: Improve cardiorespiratory fitness to achieve a reduction of symptoms when performing ADLs;Long Term: Be able to perform more ADLs without symptoms or delay the onset of symptoms           Education:Diabetes - Individual verbal and written instruction to review signs/symptoms of diabetes, desired ranges of glucose level fasting, after meals and with exercise. Acknowledge that  pre and post exercise glucose checks will be done for 3 sessions at entry of program.   Education: Know Your Numbers and Risk Factors: -Group verbal and written instruction about important numbers in your health.  Discussion of what are risk factors and how they play a role in the disease process.  Review of Cholesterol, Blood Pressure, Diabetes, and BMI and the role they play in your overall health.   Core Components/Risk Factors/Patient Goals Review:   Goals and Risk Factor Review    Row Name 10/21/19 1726             Core Components/Risk Factors/Patient Goals Review   Personal Goals Review Improve shortness of breath with ADL's       Review She is able to do some chores without being short of breath. When she vaccuums is when she gets a little short of breath.Spoke to patient about their shortness of breath and what they can do to improve. Patient has been informed of breathing techniques when starting the program. Patient is informed to tell staff if they have had any med changes and that certain meds they are taking or  not taking can be causing shortness of breath.       Expected Outcomes Short: Attend [facility] regularly to improve shortness of breath with ADL's. Long: maintain independence with ADL's              Core Components/Risk Factors/Patient Goals at Discharge (Final Review):   Goals and Risk Factor Review - 10/21/19 1726      Core Components/Risk Factors/Patient Goals Review   Personal Goals Review Improve shortness of breath with ADL's    Review She is able to do some chores without being short of breath. When she vaccuums is when she gets a little short of breath.Spoke to patient about their shortness of breath and what they can do to improve. Patient has been informed of breathing techniques when starting the program. Patient is informed to tell staff if they have had any med changes and that certain meds they are taking or not taking can be causing shortness of breath.    Expected Outcomes Short: Attend [facility] regularly to improve shortness of breath with ADL's. Long: maintain independence with ADL's           ITP Comments:  ITP Comments    Row Name 09/03/19 1059 09/10/19 0913 09/14/19 1718 09/22/19 1232 10/07/19 0555   ITP Comments Completed virtual orientation today.  EP evaluation is scheduled for Thurs 9/9 at 730am.  Documentation for diagnosis can be found in [facility] encounter 08/21/19. Completed 6MWT and gym orientation. Initial ITP created and sent for review to Dr. Emily Filbert, Medical Director. First full day of exercise!  Patient was oriented to gym and equipment including functions, settings, policies, and procedures.  Patient's individual exercise prescription and treatment plan were reviewed.  All starting workloads were established based on the results of the 6 minute walk test done at initial orientation visit.  The plan for exercise progression was also introduced and progression will be customized based on patient's performance and goals. Completed Initial RD Evaluation 30 Day  review completed. Medical Director ITP review done, changes made as directed, and signed approval by Medical Director.   [place] Name 11/04/19 0653           ITP Comments 30 Day review completed. Medical Director ITP review done, changes made as directed, and signed approval by Medical Director.  Comments:

## 2019-11-05 ENCOUNTER — Encounter: Payer: BC Managed Care – PPO | Admitting: *Deleted

## 2019-11-05 DIAGNOSIS — R0609 Other forms of dyspnea: Secondary | ICD-10-CM

## 2019-11-05 DIAGNOSIS — R0600 Dyspnea, unspecified: Secondary | ICD-10-CM | POA: Diagnosis not present

## 2019-11-05 NOTE — Progress Notes (Signed)
Daily Session Note  Patient Details  Name: Cassie [name] MRN: [id number] Date of Birth: 1970/05/03 Referring Provider:     Pulmonary Rehab from 09/10/2019 in [facility] Cardiac and Pulmonary Rehab  Referring Provider Cassie Garfinkel MD      Encounter Date: 11/05/2019  Check In:  Session Check In - 11/05/19 1746      Check-In   Supervising physician immediately available to respond to emergencies See telemetry face sheet for immediately available ER MD    Location [facility]-Cardiac & Pulmonary Rehab    Staff Present Earlean Shawl, BS, ACSM CEP, Exercise Physiologist;Darrol Brandenburg Sherryll Burger, RN BSN;Joseph Lou Miner, [place] Exercise Physiologist    Virtual Visit No    Medication changes reported     No    Fall or balance concerns reported    No    Warm-up and Cool-down Performed on first and last piece of equipment    Resistance Training Performed Yes    VAD Patient? No    PAD/SET Patient? No      Pain Assessment   Currently in Pain? No/denies              Social History   Tobacco Use  Smoking Status Former Smoker  . Packs/day: 0.10  . Years: 5.00  . Pack years: 0.50  . Types: Cigarettes  . Quit date: 02/21/2007  . Years since quitting: 12.7  Smokeless Tobacco Never Used  Tobacco Comment   social smoker    Goals Met:  Independence with exercise equipment Exercise tolerated well No report of cardiac concerns or symptoms Strength training completed today  Goals Unmet:  Not Applicable  Comments: Pt able to follow exercise prescription today without complaint.  Will continue to monitor for progression.    Dr. Emily Filbert is Medical Director for [name] and [facility].

## 2019-11-09 ENCOUNTER — Other Ambulatory Visit: Payer: Self-pay

## 2019-11-09 DIAGNOSIS — R0600 Dyspnea, unspecified: Secondary | ICD-10-CM

## 2019-11-09 DIAGNOSIS — R0609 Other forms of dyspnea: Secondary | ICD-10-CM

## 2019-11-09 NOTE — Progress Notes (Signed)
Daily Session Note  Patient Details  Name: Cassie [name] MRN: [id number] Date of Birth: 15-Jan-1970 Referring Provider:     Pulmonary Rehab from 09/10/2019 in [facility] Cardiac and Pulmonary Rehab  Referring Provider Marshell Garfinkel MD      Encounter Date: 11/09/2019  Check In:  Session Check In - 11/09/19 1731      Check-In   Supervising physician immediately available to respond to emergencies See telemetry face sheet for immediately available ER MD    Location [facility]-Cardiac & Pulmonary Rehab    Staff Present Birdie Sons, MPA, Mauricia Area, BS, ACSM CEP, Exercise Physiologist;Kara Eliezer Bottom, MS Exercise Physiologist    Virtual Visit No    Medication changes reported     No    Fall or balance concerns reported    No    Warm-up and Cool-down Performed on first and last piece of equipment    Resistance Training Performed Yes    VAD Patient? No    PAD/SET Patient? No      Pain Assessment   Currently in Pain? No/denies              Social History   Tobacco Use  Smoking Status Former Smoker  . Packs/day: 0.10  . Years: 5.00  . Pack years: 0.50  . Types: Cigarettes  . Quit date: 02/21/2007  . Years since quitting: 12.7  Smokeless Tobacco Never Used  Tobacco Comment   social smoker    Goals Met:  Independence with exercise equipment Exercise tolerated well No report of cardiac concerns or symptoms Strength training completed today  Goals Unmet:  Not Applicable  Comments: Pt able to follow exercise prescription today without complaint.  Will continue to monitor for progression.    Dr. Emily Filbert is Medical Director for [place] and [facility].

## 2019-11-11 ENCOUNTER — Other Ambulatory Visit: Payer: Self-pay

## 2019-11-11 DIAGNOSIS — R0600 Dyspnea, unspecified: Secondary | ICD-10-CM | POA: Diagnosis not present

## 2019-11-11 DIAGNOSIS — R0609 Other forms of dyspnea: Secondary | ICD-10-CM

## 2019-11-11 NOTE — Progress Notes (Signed)
Daily Session Note  Patient Details  Name: Cassie [name] MRN: [id number] Date of Birth: 09/11/1970 Referring Provider:     Pulmonary Rehab from 09/10/2019 in [facility] Cardiac and Pulmonary Rehab  Referring Provider Marshell Garfinkel MD      Encounter Date: 11/11/2019  Check In:  Session Check In - 11/11/19 1726      Check-In   Supervising physician immediately available to respond to emergencies See telemetry face sheet for immediately available ER MD    Location [facility]-Cardiac & Pulmonary Rehab    Staff Present Birdie Sons, MPA, Nino Glow, MS Exercise Physiologist;Meredith Sherryll Burger, RN BSN    Virtual Visit No    Medication changes reported     No    Fall or balance concerns reported    No    Warm-up and Cool-down Performed on first and last piece of equipment    Resistance Training Performed Yes    VAD Patient? No    PAD/SET Patient? No      Pain Assessment   Currently in Pain? No/denies              Social History   Tobacco Use  Smoking Status Former Smoker  . Packs/day: 0.10  . Years: 5.00  . Pack years: 0.50  . Types: Cigarettes  . Quit date: 02/21/2007  . Years since quitting: 12.7  Smokeless Tobacco Never Used  Tobacco Comment   social smoker    Goals Met:  Independence with exercise equipment Exercise tolerated well No report of cardiac concerns or symptoms Strength training completed today  Goals Unmet:  Not Applicable  Comments: Pt able to follow exercise prescription today without complaint.  Will continue to monitor for progression.    Dr. Emily Filbert is Medical Director for [facility] and [facility].

## 2019-11-12 ENCOUNTER — Encounter: Payer: BC Managed Care – PPO | Admitting: *Deleted

## 2019-11-12 DIAGNOSIS — R0600 Dyspnea, unspecified: Secondary | ICD-10-CM | POA: Diagnosis not present

## 2019-11-12 DIAGNOSIS — R0609 Other forms of dyspnea: Secondary | ICD-10-CM

## 2019-11-12 NOTE — Progress Notes (Signed)
Daily Session Note  Patient Details  Name: Cassie [name] MRN: [id number] Date of Birth: 1970-03-05 Referring Provider:     Pulmonary Rehab from 09/10/2019 in [facility] Cardiac and Pulmonary Rehab  Referring Provider Marshell Garfinkel MD      Encounter Date: 11/12/2019  Check In:  Session Check In - 11/12/19 1714      Check-In   Supervising physician immediately available to respond to emergencies See telemetry face sheet for immediately available ER MD    Location [facility]-Cardiac & Pulmonary Rehab    Staff Present Renita Papa, RN BSN;Joseph [address] [place], [place], ACSM CEP, Exercise Physiologist;Kara Eliezer Bottom, MS Exercise Physiologist    Virtual Visit No    Medication changes reported     No    Fall or balance concerns reported    No    Warm-up and Cool-down Performed on first and last piece of equipment    Resistance Training Performed Yes    VAD Patient? No    PAD/SET Patient? No      Pain Assessment   Currently in Pain? No/denies              Social History   Tobacco Use  Smoking Status Former Smoker  . Packs/day: 0.10  . Years: 5.00  . Pack years: 0.50  . Types: Cigarettes  . Quit date: 02/21/2007  . Years since quitting: 12.7  Smokeless Tobacco Never Used  Tobacco Comment   social smoker    Goals Met:  Independence with exercise equipment Exercise tolerated well No report of cardiac concerns or symptoms Strength training completed today  Goals Unmet:  Not Applicable  Comments: Pt able to follow exercise prescription today without complaint.  Will continue to monitor for progression.    Dr. Emily Filbert is Medical Director for [place] and [facility].

## 2019-11-16 ENCOUNTER — Ambulatory Visit: Payer: BC Managed Care – PPO | Attending: Internal Medicine

## 2019-11-16 DIAGNOSIS — Z23 Encounter for immunization: Secondary | ICD-10-CM

## 2019-11-16 NOTE — Progress Notes (Signed)
   Covid-19 Vaccination Clinic  Name:  SHAMONIQUE BATTISTE    MRN: [id number] DOB: 03/16/1970  11/16/2019  Ms. Lodico was observed post Covid-19 immunization for 15 minutes without incident. She was provided with Vaccine Information Sheet and instruction to access the V-Safe system.   Ms. Koehl was instructed to call 911 with any severe reactions post vaccine: Marland Kitchen Difficulty breathing  . Swelling of face and throat  . A fast heartbeat  . A bad rash all over body  . Dizziness and weakness   Immunizations Administered    Name Date Dose VIS Date Route   Pfizer COVID-19 Vaccine 11/16/2019  3:54 PM 0.3 mL 10/21/2019 Intramuscular   Manufacturer: [employer]   Lot: [id number]   [place]: [address]-4

## 2019-11-19 ENCOUNTER — Other Ambulatory Visit: Payer: Self-pay

## 2019-11-19 ENCOUNTER — Telehealth: Payer: Self-pay | Admitting: Pulmonary Disease

## 2019-11-19 ENCOUNTER — Ambulatory Visit
Admission: RE | Admit: 2019-11-19 | Discharge: 2019-11-19 | Disposition: A | Discharge status: Home / Self Care | Payer: BC Managed Care – PPO | Source: Ambulatory Visit | Attending: Physician Assistant | Admitting: Physician Assistant

## 2019-11-19 ENCOUNTER — Ambulatory Visit (INDEPENDENT_AMBULATORY_CARE_PROVIDER_SITE_OTHER): Payer: BC Managed Care – PPO

## 2019-11-19 ENCOUNTER — Telehealth: Payer: Self-pay

## 2019-11-19 VITALS — BP 125/83 | HR 99 | Temp 98.8°F | Resp 20 | Ht 65.0 in | Wt 210.0 lb

## 2019-11-19 DIAGNOSIS — Z87891 Personal history of nicotine dependence: Secondary | ICD-10-CM | POA: Diagnosis not present

## 2019-11-19 DIAGNOSIS — Z79899 Other long term (current) drug therapy: Secondary | ICD-10-CM | POA: Diagnosis not present

## 2019-11-19 DIAGNOSIS — Z20822 Contact with and (suspected) exposure to covid-19: Secondary | ICD-10-CM | POA: Diagnosis not present

## 2019-11-19 DIAGNOSIS — T50Z95A Adverse effect of other vaccines and biological substances, initial encounter: Secondary | ICD-10-CM | POA: Insufficient documentation

## 2019-11-19 DIAGNOSIS — J45901 Unspecified asthma with (acute) exacerbation: Secondary | ICD-10-CM | POA: Diagnosis not present

## 2019-11-19 DIAGNOSIS — X58XXXA Exposure to other specified factors, initial encounter: Secondary | ICD-10-CM | POA: Insufficient documentation

## 2019-11-19 DIAGNOSIS — R059 Cough, unspecified: Secondary | ICD-10-CM

## 2019-11-19 DIAGNOSIS — I10 Essential (primary) hypertension: Secondary | ICD-10-CM | POA: Diagnosis not present

## 2019-11-19 DIAGNOSIS — R0602 Shortness of breath: Secondary | ICD-10-CM

## 2019-11-19 DIAGNOSIS — R0789 Other chest pain: Secondary | ICD-10-CM

## 2019-11-19 DIAGNOSIS — Z8616 Personal history of COVID-19: Secondary | ICD-10-CM | POA: Insufficient documentation

## 2019-11-19 LAB — RESP PANEL BY RT-PCR (RSV, FLU A&B, COVID)  RVPGX2
Influenza A by PCR: NEGATIVE
Influenza B by PCR: NEGATIVE
Resp Syncytial Virus by PCR: NEGATIVE
SARS Coronavirus 2 by RT PCR: NEGATIVE

## 2019-11-19 MED ORDER — PREDNISONE 20 MG PO TABS: 40.0000 mg | ORAL_TABLET | Freq: Every day | ORAL | 0 refills | Status: AC

## 2019-11-19 NOTE — ED Provider Notes (Signed)
[facility]    CSN: [id number] Arrival date & time: 11/19/19  1619      History   Chief Complaint Chief Complaint  Patient presents with  . Appointment  . Cough  . Shortness of Breath  . Chest Pain  . Fever    HPI Cassie [name] is a 49 y.o. female for onset of chest tightness, dry cough, voice hoarseness and intermittent shortness of breath after getting the first dose of her vaccine 3 days ago.  Patient states that she has had long-haul Covid symptoms and has been to pulmonary rehab for many months.  She has a history of COVID-19 infection in August 2020 and has dealt with side effects ever since.  Patient states that she was told that her heart and lungs are stable enough to get the Covid Therapist, music)  vaccine so she did so a couple days ago.  She says that it seemed to exacerbate all of her previous symptoms that she had seemed to get better from.  She states that the day after she got the vaccine she developed a fever up to 102 degrees and felt very achy and tired.  The fever has since resolved.  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  Patient states that she has been doing well and has not needed to use any of her inhalers that she was using before.  Patient goes to pulmonary rehab 3 times a week but has not been this week.   Patient states that she informed her pulmonologist about her symptoms and they advised her to take Tylenol and follow-up in a couple of days.  She notified her PCP also and they advised that she go to urgent care and get checked out so that is why she is here today.  States she just want to have her lungs looked and to be checked over.    Patient denies any blood clotting disorders.  No history of DVT or PE.  No history of cancer.  Styling sedentary lifestyle.  Not taking oral contraceptives.  She has no other complaints or concerns.  HPI  Past  Medical History:  Diagnosis Date  . Allergy   . Asthma   . Cardiac disease   . Diffuse cystic mastopathy   . GERD (gastroesophageal reflux disease)   . Guttate psoriasis   . H/O syncope    cards work-up WNL (Hochrein) thought 2/2 overmedication vs arrhythmias  . Hx of endometriosis    hysterectomy  . Hx of migraines    occasional; improved since hysterectomy  . Labral tear of shoulder 02/2014   R, pending surgery Tamera Punt)  . Rosacea    mild    Patient Active Problem List   Diagnosis Date Noted  . Cough 10/21/2019  . Brain fog 08/21/2019  . Hoarseness of voice 08/21/2019  . Abnormal findings on diagnostic imaging of lung 08/21/2019  . DOE (dyspnea on exertion) 04/16/2019  . Precordial chest pain 01/22/2019  . Palpitations 01/22/2019  . Tachycardia 01/15/2019  . Low TSH level 01/15/2019  . Hair loss 01/06/2019  . Upper airway cough syndrome 11/21/2018  . Urinary frequency 10/24/2018  . History of COVID-19 09/09/2018  . Frontal headache 07/14/2018  . Vitamin D deficiency 12/12/2017  . Floaters, bilateral 11/08/2017  . Pre-syncope 11/08/2017  . Stressful life events affecting family and household 11/08/2017  . Chest pain at rest 02/09/2016  . Extrinsic asthma 01/20/2016  .  Labral tear of shoulder 02/01/2014  . Bowel habit changes 04/28/2013  . Obesity, Class I, BMI 30-34.9 01/09/2013  . Diffuse cystic mastopathy 08/18/2012  . Family history of breast cancer 08/18/2012  . Healthcare maintenance 02/27/2011  . Family history of melanoma 02/27/2011  . SYNCOPE, HX OF 08/11/2009  . BREAST MASS, HX OF 08/11/2009    Past Surgical History:  Procedure Laterality Date  . BREAST BIOPSY  2010   left; core biopsy  . BREAST EXCISIONAL BIOPSY Bilateral    multiple bilateral  . BREAST MASS EXCISION  2008  . BREAST SURGERY Right    excision of lesion  . CARDIAC CATHETERIZATION  2007  . CHOLECYSTECTOMY  2008  . COLONOSCOPY  2006   WNL Nicolasa Ducking)  . COLONOSCOPY  06/2013   mod  diverticulosis, rpt 10 yrs Henrene Pastor)  . LAPAROSCOPY  1994   endometriosis  . TONSILLECTOMY  1994  . TONSILLECTOMY AND ADENOIDECTOMY  1995  . TOTAL ABDOMINAL HYSTERECTOMY     Dr Rayford Halsted for endometriosis    OB History    Gravida  3   Para  2   Term  2   Preterm      AB  1   Living  2     SAB  1   TAB      Ectopic      Multiple      Live Births  2        Obstetric Comments  1st Menstrual Cycle: 13 1st Pregnancy:  23         Home Medications    Prior to Admission medications   Medication Sig Start Date End Date Taking? Authorizing Provider  albuterol (PROVENTIL) (2.5 MG/3ML) 0.083% nebulizer solution Take 3 mLs (2.5 mg total) by nebulization every 6 (six) hours as needed for wheezing or shortness of breath. 10/24/18  Yes Ria Bush, MD  amitriptyline (ELAVIL) 50 MG tablet Take 50 mg by mouth at bedtime. 04/07/19  Yes [provider]  Biotin 5 MG CAPS Take 1 capsule (5 mg total) by mouth daily. 01/06/19  Yes Ria Bush, MD  chlorpheniramine (CHLOR-TRIMETON) 4 MG tablet Take by mouth. 4 times daily   Yes [provider]  cholecalciferol (VITAMIN D3) 25 MCG (1000 UNIT) tablet Take 1,000 Units by mouth daily.   Yes [provider]  Dextromethorphan Polistirex (DELSYM PO) Take by mouth daily.   Yes [provider]  diltiazem (CARDIZEM CD) 240 MG 24 hr capsule Take 1 capsule (240 mg total) by mouth daily. 10/22/19  Yes Minus Breeding, MD  famotidine (PEPCID) 20 MG tablet One after supper 11/21/18  Yes Tanda Rockers, MD  gabapentin (NEURONTIN) 300 MG capsule Take 1 capsule (300 mg total) by mouth 2 (two) times daily. 10/22/19  Yes Minus Breeding, MD  ipratropium (ATROVENT) 0.02 % nebulizer solution Inhale into the lungs. 06/11/19  Yes [provider]  pantoprazole (PROTONIX) 40 MG tablet Take 1 tablet (40 mg total) by mouth daily. Take 30-60 min before first meal of the day 07/21/19  Yes Tanda Rockers, MD    Respiratory Therapy Supplies (FLUTTER) DEVI Use as directed 04/14/19  Yes Tanda Rockers, MD  predniSONE (DELTASONE) 20 MG tablet Take 2 tablets (40 mg total) by mouth daily for 5 days. 11/19/19 11/24/19  Danton Clap, PA-C    Family History Family History  Problem Relation Age of Onset  . Skin cancer Mother        melanoma  . Hypertension  Mother        also had polio  . Irritable bowel syndrome Mother   . Breast cancer Mother 62  . Coronary artery disease Father        age 44's  . Hypertension Father   . Breast cancer Maternal Grandmother 38  . Stroke Maternal Grandmother   . Other Son        hyperinsulinemic  . Coronary artery disease Paternal Uncle        age 31's  . Skin cancer Sister 27       melanoma - cancer in eye and uterus  . Thyroid disease Sister        hashimoto thyroiditis  . Breast cancer Other        pat. great aunt  . Irritable bowel syndrome Daughter   . Diabetes Maternal Grandfather   . Heart disease Maternal Grandfather   . Diabetes Paternal Uncle   . Epilepsy Son   . Breast cancer Cousin        second cousin  . Pulmonary embolism Other        died from this after MVA  . Heart disease Paternal Grandmother   . Heart disease Paternal Grandfather   . Colon cancer Neg Hx     Social History Social History   Tobacco Use  . Smoking status: Former Smoker    Packs/day: 0.10    Years: 5.00    Pack years: 0.50    Types: Cigarettes    Quit date: 02/21/2007    Years since quitting: 12.7  . Smokeless tobacco: Never Used  . Tobacco comment: social smoker  Vaping Use  . Vaping Use: Never used  Substance Use Topics  . Alcohol use: Not Currently    Alcohol/week: 0.0 standard drinks    Comment: occas  . Drug use: No     Allergies   Fish allergy, Shellfish allergy, Chocolate, Cocoa, Penicillins, and Tape   Review of Systems Review of Systems  Constitutional: Positive for fatigue and fever. Negative for chills and diaphoresis.  HENT: Positive  for voice change (acute worsening of post COVID chronic voice hoarseness). Negative for congestion, ear pain, facial swelling, rhinorrhea, sinus pressure, sinus pain and sore throat.   Respiratory: Positive for cough, chest tightness and shortness of breath. Negative for wheezing.   Gastrointestinal: Negative for abdominal pain, nausea and vomiting.  Musculoskeletal: Negative for arthralgias and myalgias.  Skin: Negative for rash.  Neurological: Negative for weakness and headaches.  Hematological: Negative for adenopathy.     Physical Exam Triage Vital Signs ED Triage Vitals  Enc Vitals Group     BP 11/19/19 1633 125/83     Pulse Rate 11/19/19 1633 99     Resp 11/19/19 1633 20     Temp 11/19/19 1633 98.8 F (37.1 C)     Temp Source 11/19/19 1633 Oral     SpO2 11/19/19 1633 98 %     Weight 11/19/19 1634 210 lb (95.3 kg)     Height 11/19/19 1634 5\' 5"  (1.651 m)     Head Circumference --      Peak Flow --      Pain Score 11/19/19 1632 6     Pain Loc --      Pain Edu? --      Excl. in [place]? --    No data found.  Updated Vital Signs BP 125/83 (BP Location: Left Arm)   Pulse 99   Temp 98.8 F (37.1 C) (Oral)  Resp 20   Ht 5\' 5"  (1.651 m)   Wt 210 lb (95.3 kg)   LMP 01/27/2000   SpO2 98%   BMI 34.95 kg/m       Physical Exam Vitals and nursing note reviewed.  Constitutional:      General: She is not in acute distress.    Appearance: Normal appearance. She is not ill-appearing or toxic-appearing.     Comments: Persistent dry coughing noted  HENT:     Head: Normocephalic and atraumatic.     Nose: Nose normal.     Mouth/Throat:     Mouth: Mucous membranes are moist.     Pharynx: Oropharynx is clear. No posterior oropharyngeal erythema.  Eyes:     General: No scleral icterus.       Right eye: No discharge.        Left eye: No discharge.     Conjunctiva/sclera: Conjunctivae normal.  Cardiovascular:     Rate and Rhythm: Normal rate and regular rhythm.     Heart  sounds: Normal heart sounds.  Pulmonary:     Effort: Pulmonary effort is normal. No respiratory distress.     Breath sounds: Normal breath sounds. No wheezing, rhonchi or rales.  Musculoskeletal:     Cervical back: Neck supple.  Skin:    General: Skin is dry.  Neurological:     General: No focal deficit present.     Mental Status: She is alert. Mental status is at baseline.     Motor: No weakness.     Gait: Gait normal.  Psychiatric:        Mood and Affect: Mood normal.        Behavior: Behavior normal.        Thought Content: Thought content normal.      UC Treatments / Results  Labs (all labs ordered are listed, but only abnormal results are displayed) Labs Reviewed  RESP PANEL BY RT-PCR (RSV, FLU A&B, COVID)  [id number]    EKG   Radiology DG Chest 2 View  Result Date: 11/19/2019 CLINICAL DATA:  Cough, shortness of breath fever. Chest tightness for 2 days after getting the COVID 19 vaccine on 11/16/2019. EXAM: CHEST - 2 VIEW COMPARISON:  12/08/2018 FINDINGS: Poor inspiration. Normal sized heart. Clear lungs with normal vascularity. Mild thoracic spine degenerative changes. Cholecystectomy clips. IMPRESSION: No acute abnormality.  A low Electronically Signed   By: Claudie Revering M.D.   On: 11/19/2019 17:26    Procedures Procedures (including critical care time)  Medications Ordered in UC Medications - No data to display  Initial Impression / Assessment and Plan / UC Course  I have reviewed the triage vital signs and the nursing notes.  Pertinent labs & imaging results that were available during my care of the patient were reviewed by me and considered in my medical decision making (see chart for details).   49 year old female presenting for dry cough, fatigue, increased chest tightness and breathing difficulty post Covid vaccination 3 days ago.  Patient is a long-haul Covid patient and does go to pulmonary rehab 3 times weekly.  She is followed by pulmonologist.  Reviewed  her pulmonology progress notes.  All patient's vital signs are normal and stable.  She is afebrile and oxygen is 98%.  Her chest is clear to auscultation although she does have difficulty taking deep breath due to the cough.  I suspect this is a bronchospasm.  Patient is not in any acute distress.  Respiratory panel obtained today  and flu and Covid testing negative.  Chest x-ray also obtained today.  Personally reviewed the chest x-ray which does not show any acute abnormalities.  Discussed all test results with patient.  I did advise patient that overall she is well-appearing and I suspect that she is having a mild asthma exacerbation/bronchospasms secondary to the vaccination.  Explained that the symptoms should get better over the next couple of days and resolved.  Prednisone sent to help with the exacerbation.  Advised her to continue at home albuterol nebulizer treatments.  Advised Robitussin or Tessalon Perles to help with the cough.  Advised increasing fluid intake and rest.  Advise close follow-up with pulmonologist.  Patient low suspicion for PE at this time, but I have given her strict ED precautions for any worsening symptoms and discussed that I cannot 100% rule out this possibility at this time but it is very low likelihood.  Patient is understanding and agreeable plan is to monitor herself closely and go to ED if any symptoms worsen.   Final Clinical Impressions(s) / UC Diagnoses   Final diagnoses:  Cough  Reactive airway disease with acute exacerbation, unspecified asthma severity, unspecified whether persistent  Vaccine reaction, initial encounter     Discharge Instructions     Your respiratory panel was negative for Covid and flu today.  A chest x-ray did not reveal any acute findings.  There is no sign of pneumonia on the x-ray.  All of her vital signs are stable and her chest is clear although you are not able to take large breaths due to the cough.  Take over-the-counter  Robitussin or your Tessalon Perles to help with cough.  Increase fluid intake and rest.  Use your albuterol inhaler as needed for any chest tightness or breathing difficulty.  Follow-up with your pulmonologist as soon as you are able to for recheck.  Go to emergency department if you are not feeling better in the next 3 days or you develop a fever, increased fatigue, worsening cough, worsening chest pain on breathing, increased breathing difficulty or wheezing, increased weakness, or any new acute worsening of your symptoms.  As discussed, I cannot rule out a blood clot today but I have very low suspicion for one.  But, if any symptoms worsen then you must go to emergency department for work-up.    ED Prescriptions    Medication Sig Dispense Auth. Provider   predniSONE (DELTASONE) 20 MG tablet Take 2 tablets (40 mg total) by mouth daily for 5 days. 10 tablet Gretta Cool     PDMP not reviewed this encounter.   Danton Clap, PA-C 11/19/19 1756

## 2019-11-19 NOTE — Discharge Instructions (Addendum)
Your respiratory panel was negative for Covid and flu today.  A chest x-ray did not reveal any acute findings.  There is no sign of pneumonia on the x-ray.  All of her vital signs are stable and her chest is clear although you are not able to take large breaths due to the cough.  Take over-the-counter Robitussin or your Tessalon Perles to help with cough.  Increase fluid intake and rest.  Use your albuterol inhaler as needed for any chest tightness or breathing difficulty.  Follow-up with your pulmonologist as soon as you are able to for recheck.  Go to emergency department if you are not feeling better in the next 3 days or you develop a fever, increased fatigue, worsening cough, worsening chest pain on breathing, increased breathing difficulty or wheezing, increased weakness, or any new acute worsening of your symptoms.  As discussed, I cannot rule out a blood clot today but I have very low suspicion for one.  But, if any symptoms worsen then you must go to emergency department for work-up.

## 2019-11-19 NOTE — ED Triage Notes (Signed)
Patient in today c/o cough, sob, fever (99-102) and chest tightness 2 days after she got the covid vaccine on 11/16/19. Patient has been using Delsym, Tylenol and Motrin.

## 2019-11-19 NOTE — Telephone Encounter (Signed)
Pt called back and pulmonology advised to take tylenol and call them back on 11/23/19. Pt said she felt like pulmonology just dismissed pts concern. Pt sounded congested and cough sounded wheezy. Pt is going to UC today to have someone listen to her lungs. FYI to Dr Darnell Level.

## 2019-11-19 NOTE — Telephone Encounter (Addendum)
plz call for update after [facility] eval yesterday. Seems she was placed on prednisone course.

## 2019-11-19 NOTE — Telephone Encounter (Signed)
Called and spoke to pt. Informed her of the recs per Dr. Vaughan Browner. Pt verbalized understanding and denied any further questions or concerns at this time.

## 2019-11-19 NOTE — Telephone Encounter (Signed)
Spoke with the pt  She states she received her first covid vaccine Therapist, music) 11/16/19  24 hours later she started running temp of 102, chills, aches  She states yesterday and today still has low grade temp 99.0  She is coughing again-non prod and "feels like a weight is on my chest" She is not having any wheezing or increased SOB  She is still taking all of the same meds   . Current Outpatient Medications on File Prior to Visit  Medication Sig Dispense Refill  . albuterol (PROVENTIL) (2.5 MG/3ML) 0.083% nebulizer solution Take 3 mLs (2.5 mg total) by nebulization every 6 (six) hours as needed for wheezing or shortness of breath. 150 mL 0  . amitriptyline (ELAVIL) 50 MG tablet Take 50 mg by mouth at bedtime.    . Biotin 5 MG CAPS Take 1 capsule (5 mg total) by mouth daily.  0  . chlorpheniramine (CHLOR-TRIMETON) 4 MG tablet Take by mouth. 4 times daily    . cholecalciferol (VITAMIN D3) 25 MCG (1000 UNIT) tablet Take 1,000 Units by mouth daily.    Marland Kitchen Dextromethorphan Polistirex (DELSYM PO) Take by mouth daily.    Marland Kitchen diltiazem (CARDIZEM CD) 240 MG 24 hr capsule Take 1 capsule (240 mg total) by mouth daily. 90 capsule 3  . famotidine (PEPCID) 20 MG tablet One after supper 30 tablet 11  . gabapentin (NEURONTIN) 300 MG capsule Take 1 capsule (300 mg total) by mouth 2 (two) times daily. 180 capsule 3  . ipratropium (ATROVENT) 0.02 % nebulizer solution Inhale into the lungs.    . pantoprazole (PROTONIX) 40 MG tablet Take 1 tablet (40 mg total) by mouth daily. Take 30-60 min before first meal of the day 30 tablet 2  . Respiratory Therapy Supplies (FLUTTER) DEVI Use as directed 1 each 0   No current facility-administered medications on file prior to visit.    Please advise thanks!

## 2019-11-19 NOTE — Telephone Encounter (Signed)
Pt has been in pulmonary rehab for months and is still in pulmonary rehab; pt said cardiology advised pt thought would be OK for pt to get 1st covid vaccine. Pt got pfizer covid vaccine on on 11/16/19 and on 11/17/19 pt temp was 102 after tylenol temp went down to 99 and today temp is 99. Pt is very congested in chest and has dry cough. Pt had covid Aug 2020.  Pt wants appt to come into office; per pts symptoms unable to bring into office but could do virtual appt but would not be able to listen to lungs. Pt said she called [facility] first because it was difficult to get thru to pulmonary. Pt does want face to face visit and will call pulmonary and if cannot get thru will go to [facility].  Sending note to Dr Darnell Level

## 2019-11-19 NOTE — Telephone Encounter (Signed)
Recommend over-the-counter treatment with Tylenol. Does not need steroids or antibiotics based on symptoms It should improve in the next few days.  Call back for office visit if there are persistent symptoms

## 2019-11-20 ENCOUNTER — Ambulatory Visit: Payer: BC Managed Care – PPO | Admitting: Family Medicine

## 2019-11-20 NOTE — Telephone Encounter (Signed)
Spoke with pt asking for an update.  Says she was seen at UC due to SOB, cough, chest tightness and congestion.  States she's still having sxs except the fever.  Says she will call back Monday or send MyChart message with an update after taking prednisone for a few days.

## 2019-11-30 ENCOUNTER — Other Ambulatory Visit: Payer: Self-pay

## 2019-11-30 DIAGNOSIS — R0609 Other forms of dyspnea: Secondary | ICD-10-CM

## 2019-11-30 DIAGNOSIS — R0600 Dyspnea, unspecified: Secondary | ICD-10-CM | POA: Diagnosis not present

## 2019-11-30 NOTE — Progress Notes (Signed)
Daily Session Note  Patient Details  Name: Cassie [name] MRN: [id number] Date of Birth: [birth year] Referring Provider:     Pulmonary Rehab from 09/10/2019 in [facility]  Referring Provider Marshell Garfinkel MD      Encounter Date: 11/30/2019  Check In:  Session Check In - 11/30/19 1715      Check-In   Supervising physician immediately available to respond to emergencies See telemetry face sheet for immediately available ER MD    Location [facility]    Staff Present Birdie Sons, MPA, Mauricia Area, BS, ACSM CEP, Exercise Physiologist;Kara Eliezer Bottom, MS Exercise Physiologist    Virtual Visit No    Medication changes reported     No    Fall or balance concerns reported    No    Warm-up and Cool-down Performed on first and last piece of equipment    Resistance Training Performed Yes    VAD Patient? No    PAD/SET Patient? No      Pain Assessment   Currently in Pain? No/denies              Social History   Tobacco Use  Smoking Status Former Smoker  . Packs/day: 0.10  . Years: 5.00  . Pack years: 0.50  . Types: Cigarettes  . Quit date: 02/21/2007  . Years since quitting: 12.7  Smokeless Tobacco Never Used  Tobacco Comment   social smoker    Goals Met:  Independence with exercise equipment Exercise tolerated well No report of cardiac concerns or symptoms Strength training completed today  Goals Unmet:  Not Applicable  Comments: Pt able to follow exercise prescription today without complaint.  Will continue to monitor for progression.    Dr. Emily Filbert is Medical Director for [facility].

## 2019-12-02 ENCOUNTER — Encounter: Payer: Self-pay | Admitting: *Deleted

## 2019-12-02 ENCOUNTER — Encounter: Payer: BC Managed Care – PPO | Attending: Pulmonary Disease

## 2019-12-02 ENCOUNTER — Other Ambulatory Visit: Payer: Self-pay

## 2019-12-02 DIAGNOSIS — R0609 Other forms of dyspnea: Secondary | ICD-10-CM

## 2019-12-02 DIAGNOSIS — R0600 Dyspnea, unspecified: Secondary | ICD-10-CM

## 2019-12-02 NOTE — Progress Notes (Signed)
Pulmonary Individual Treatment Plan  Patient Details  Name: Cassie [name] MRN: [id number] Date of Birth: 12-20-1970 Referring Provider:     Pulmonary Rehab from 09/10/2019 in [facility] Cardiac and Pulmonary Rehab  Referring Provider Marshell Garfinkel MD      Initial Encounter Date:    Pulmonary Rehab from 09/10/2019 in [facility] Cardiac and Pulmonary Rehab  Date 09/10/19      Visit Diagnosis: DOE (dyspnea on exertion)  Patient's Home Medications on Admission:  Current Outpatient Medications:  .  albuterol (PROVENTIL) (2.5 MG/3ML) 0.083% nebulizer solution, Take 3 mLs (2.5 mg total) by nebulization every 6 (six) hours as needed for wheezing or shortness of breath., Disp: 150 mL, Rfl: 0 .  amitriptyline (ELAVIL) 50 MG tablet, Take 50 mg by mouth at bedtime., Disp: , Rfl:  .  Biotin 5 MG CAPS, Take 1 capsule (5 mg total) by mouth daily., Disp: , Rfl: 0 .  chlorpheniramine (CHLOR-TRIMETON) 4 MG tablet, Take by mouth. 4 times daily, Disp: , Rfl:  .  cholecalciferol (VITAMIN D3) 25 MCG (1000 UNIT) tablet, Take 1,000 Units by mouth daily., Disp: , Rfl:  .  Dextromethorphan Polistirex (DELSYM PO), Take by mouth daily., Disp: , Rfl:  .  diltiazem (CARDIZEM CD) 240 MG 24 hr capsule, Take 1 capsule (240 mg total) by mouth daily., Disp: 90 capsule, Rfl: 3 .  famotidine (PEPCID) 20 MG tablet, One after supper, Disp: 30 tablet, Rfl: 11 .  gabapentin (NEURONTIN) 300 MG capsule, Take 1 capsule (300 mg total) by mouth 2 (two) times daily., Disp: 180 capsule, Rfl: 3 .  ipratropium (ATROVENT) 0.02 % nebulizer solution, Inhale into the lungs., Disp: , Rfl:  .  pantoprazole (PROTONIX) 40 MG tablet, Take 1 tablet (40 mg total) by mouth daily. Take 30-60 min before first meal of the day, Disp: 30 tablet, Rfl: 2 .  Respiratory Therapy Supplies (FLUTTER) DEVI, Use as directed, Disp: 1 each, Rfl: 0  Past Medical History: Past Medical History:  Diagnosis Date  . Allergy   . Asthma   . Cardiac disease   . Diffuse  cystic mastopathy   . GERD (gastroesophageal reflux disease)   . Guttate psoriasis   . H/O syncope    cards work-up WNL (Hochrein) thought 2/2 overmedication vs arrhythmias  . Hx of endometriosis    hysterectomy  . Hx of migraines    occasional; improved since hysterectomy  . Labral tear of shoulder 02/2014   R, pending surgery Tamera Punt)  . Rosacea    mild    Tobacco Use: Social History   Tobacco Use  Smoking Status Former Smoker  . Packs/day: 0.10  . Years: 5.00  . Pack years: 0.50  . Types: Cigarettes  . Quit date: 02/21/2007  . Years since quitting: 12.7  Smokeless Tobacco Never Used  Tobacco Comment   social smoker    Labs: Recent Review Flowsheet Data    Labs for ITP Cardiac and Pulmonary Rehab Latest Ref Rng & Units 08/12/2009 02/23/2011 02/16/2016 12/13/2017   Cholestrol 0 - 200 mg/dL 168 160 158 174   LDLCALC 0 - 99 mg/dL 89 88 82 94   HDL >39.00 mg/dL 41.60 50.60 50 59.10   Trlycerides 0 - 149 mg/dL 187.0(H) 106.0 128 108.0       Pulmonary Assessment Scores:  Pulmonary Assessment Scores    Row Name 09/03/19 1051 09/10/19 1024       ADL UCSD   ADL Phase Entry Entry    SOB Score total 57 57  Rest 1 1    Walk 2 2    Stairs 4 4    Bath 2 2    Dress 2 2    Shop 3 3      CAT Score   CAT Score 25 25      mMRC Score   mMRC Score -- 3           UCSD: Self-administered rating of dyspnea associated with activities of daily living (ADLs) 6-point scale (0 = "not at all" to 5 = "maximal or unable to do because of breathlessness")  Scoring Scores range from 0 to 120.  Minimally important difference is 5 units  CAT: CAT can identify the health impairment of COPD patients and is better correlated with disease progression.  CAT has a scoring range of zero to 40. The CAT score is classified into four groups of low (less than 10), medium (10 - 20), high (21-30) and very high (31-40) based on the impact level of disease on health status. A CAT score over 10  suggests significant symptoms.  A worsening CAT score could be explained by an exacerbation, poor medication adherence, poor inhaler technique, or progression of COPD or comorbid conditions.  CAT MCID is 2 points  mMRC: mMRC (Modified Medical Research Council) Dyspnea Scale is used to assess the degree of baseline functional disability in patients of respiratory disease due to dyspnea. No minimal important difference is established. A decrease in score of 1 point or greater is considered a positive change.   Pulmonary Function Assessment:   Exercise Target Goals: Exercise Program Goal: Individual exercise prescription set using results from initial 6 min walk test and THRR while considering  patient's activity barriers and safety.   Exercise Prescription Goal: Initial exercise prescription builds to 30-45 minutes a day of aerobic activity, 2-3 days per week.  Home exercise guidelines will be given to patient during program as part of exercise prescription that the participant will acknowledge.  Education: Aerobic Exercise: - Group verbal and visual presentation on the components of exercise prescription. Introduces F.I.T.T principle from ACSM for exercise prescriptions.  Reviews F.I.T.T. principles of aerobic exercise including progression. Written material given at graduation.   Education: Resistance Exercise: - Group verbal and visual presentation on the components of exercise prescription. Introduces F.I.T.T principle from ACSM for exercise prescriptions  Reviews F.I.T.T. principles of resistance exercise including progression. Written material given at graduation.    Education: Exercise & Equipment Safety: - Individual verbal instruction and demonstration of equipment use and safety with use of the equipment.   Pulmonary Rehab from 09/30/2019 in [facility] Cardiac and Pulmonary Rehab  Date 09/10/19  Educator JH  Instruction Review Code 1- Verbalizes Understanding      Education:  Exercise Physiology & General Exercise Guidelines: - Group verbal and written instruction with models to review the exercise physiology of the cardiovascular system and associated critical values. Provides general exercise guidelines with specific guidelines to those with heart or lung disease.    Education: Flexibility, Balance, Mind/Body Relaxation: - Group verbal and visual presentation with interactive activity on the components of exercise prescription. Introduces F.I.T.T principle from ACSM for exercise prescriptions. Reviews F.I.T.T. principles of flexibility and balance exercise training including progression. Also discusses the mind body connection.  Reviews various relaxation techniques to help reduce and manage stress (i.e. Deep breathing, progressive muscle relaxation, and visualization). Balance handout provided to take home. Written material given at graduation.   Activity Barriers & Risk Stratification:  Activity Barriers &   Cardiac Risk Stratification - 09/10/19 0915      Activity Barriers & Cardiac Risk Stratification   Activity Barriers Deconditioning;Shortness of Breath;History of Falls;Muscular Weakness           6 Minute Walk:  6 Minute Walk    Row Name 09/10/19 0913         6 Minute Walk   Phase Initial     Distance 1163 feet     Walk Time 6 minutes     # of Rest Breaks 0     MPH 2.2     METS 3.96     RPE 14     Perceived Dyspnea  2     VO2 Peak 13.86     Symptoms Yes (comment)     Comments SOB, coughing     Resting HR 109 bpm     Resting BP 118/56     Resting Oxygen Saturation  97 %     Exercise Oxygen Saturation  during 6 min walk 95 %     Max Ex. HR 125 bpm     Max Ex. BP 144/74     2 Minute Post BP 132/70       Interval HR   1 Minute HR 123     2 Minute HR 120     3 Minute HR 118     4 Minute HR 116     5 Minute HR 118     6 Minute HR 125     2 Minute Post HR 111     Interval Heart Rate? Yes       Interval Oxygen   Interval Oxygen? Yes      Baseline Oxygen Saturation % 97 %     1 Minute Oxygen Saturation % 96 %     1 Minute Liters of Oxygen 0 L  Room Air     2 Minute Oxygen Saturation % 95 %     2 Minute Liters of Oxygen 0 L     3 Minute Oxygen Saturation % 95 %     3 Minute Liters of Oxygen 0 L     4 Minute Oxygen Saturation % 96 %     4 Minute Liters of Oxygen 0 L     5 Minute Oxygen Saturation % 96 %     5 Minute Liters of Oxygen 0 L     6 Minute Oxygen Saturation % 96 %     6 Minute Liters of Oxygen 0 L     2 Minute Post Oxygen Saturation % 96 %     2 Minute Post Liters of Oxygen 0 L           Oxygen Initial Assessment:  Oxygen Initial Assessment - 09/03/19 1051      Home Oxygen   Home Oxygen Device None    Sleep Oxygen Prescription None    Home Exercise Oxygen Prescription None    Home Resting Oxygen Prescription None      Initial 6 min Walk   Oxygen Used None      Program Oxygen Prescription   Program Oxygen Prescription None      Intervention   Short Term Goals To learn and understand importance of monitoring SPO2 with pulse oximeter and demonstrate accurate use of the pulse oximeter.;To learn and understand importance of maintaining oxygen saturations>88%;To learn and demonstrate proper pursed lip breathing techniques or other breathing techniques.;To learn and demonstrate proper use of respiratory medications      Long  Term Goals Verbalizes importance of monitoring SPO2 with pulse oximeter and return demonstration;Maintenance of O2 saturations>88%;Exhibits proper breathing techniques, such as pursed lip breathing or other method taught during program session;Compliance with respiratory medication;Demonstrates proper use of MDI's           Oxygen Re-Evaluation:  Oxygen Re-Evaluation    Row Name 09/14/19 1722 10/21/19 1723 11/30/19 1748         Program Oxygen Prescription   Program Oxygen Prescription None None None       Home Oxygen   Home Oxygen Device None None None     Sleep Oxygen  Prescription None None None     Home Exercise Oxygen Prescription None None None     Home Resting Oxygen Prescription None None None       Goals/Expected Outcomes   Short Term Goals To learn and understand importance of monitoring SPO2 with pulse oximeter and demonstrate accurate use of the pulse oximeter.;To learn and understand importance of maintaining oxygen saturations>88%;To learn and demonstrate proper pursed lip breathing techniques or other breathing techniques.;To learn and demonstrate proper use of respiratory medications To learn and understand importance of maintaining oxygen saturations>88%;To learn and understand importance of monitoring SPO2 with pulse oximeter and demonstrate accurate use of the pulse oximeter. To learn and understand importance of maintaining oxygen saturations>88%;To learn and understand importance of monitoring SPO2 with pulse oximeter and demonstrate accurate use of the pulse oximeter.     Long  Term Goals Verbalizes importance of monitoring SPO2 with pulse oximeter and return demonstration;Maintenance of O2 saturations>88%;Exhibits proper breathing techniques, such as pursed lip breathing or other method taught during program session;Compliance with respiratory medication;Demonstrates proper use of MDI's Verbalizes importance of monitoring SPO2 with pulse oximeter and return demonstration;Maintenance of O2 saturations>88% --     Comments Reviewed PLB technique with pt.  Talked about how it works and it's importance in maintaining their exercise saturations. She has a pulse oximeter to check her oxygen saturation at home. Informed her why it is important to have one. Reviewed that oxygen saturations should be 88 percent and above. She checks her oxygen at home if she is having breathing issues. She knows to be 88 percent and above when she is exercising. --     Goals/Expected Outcomes Short: Become more profiecient at using PLB.   Long: Become independent at using PLB.  Short: monitor oxygen at home with exertion. Long: maintain oxygen saturations above 88 percent independently. --            Oxygen Discharge (Final Oxygen Re-Evaluation):  Oxygen Re-Evaluation - 11/30/19 1748      Program Oxygen Prescription   Program Oxygen Prescription None      Home Oxygen   Home Oxygen Device None    Sleep Oxygen Prescription None    Home Exercise Oxygen Prescription None    Home Resting Oxygen Prescription None      Goals/Expected Outcomes   Short Term Goals To learn and understand importance of maintaining oxygen saturations>88%;To learn and understand importance of monitoring SPO2 with pulse oximeter and demonstrate accurate use of the pulse oximeter.           Initial Exercise Prescription:  Initial Exercise Prescription - 09/10/19 0900      Date of Initial Exercise RX and Referring Provider   Date 09/10/19    Referring Provider Marshell Garfinkel MD      Treadmill   MPH 2.2    Grade 1    Minutes  15    METs 2.99      REL-XR   Level 1    Speed 50    Minutes 15    METs 2      T5 Nustep   Level 2    SPM 80    Minutes 15    METs 2      Prescription Details   Frequency (times per week) 3    Duration Progress to 30 minutes of continuous aerobic without signs/symptoms of physical distress      Intensity   THRR 40-80% of Max Heartrate 135-161    Ratings of Perceived Exertion 11-13    Perceived Dyspnea 0-4      Progression   Progression Continue to progress workloads to maintain intensity without signs/symptoms of physical distress.      Resistance Training   Training Prescription Yes    Weight 3 lb    Reps 10-15           Perform Capillary Blood Glucose checks as needed.  Exercise Prescription Changes:  Exercise Prescription Changes    Row Name 09/10/19 0900 09/16/19 1000 09/21/19 1700 09/29/19 0800 10/14/19 1000     Response to Exercise   Blood Pressure (Admit) 118/56 104/72 -- 122/70 126/74   Blood Pressure (Exercise)  144/74 120/70 -- 130/72 128/72   Blood Pressure (Exit) 124/70 122/70 -- 112/68 122/82   Heart Rate (Admit) 109 bpm 107 bpm -- 103 bpm 102 bpm   Heart Rate (Exercise) 125 bpm 114 bpm -- 116 bpm 115 bpm   Heart Rate (Exit) 114 bpm 100 bpm -- 100 bpm 115 bpm   Oxygen Saturation (Admit) 97 % 97 % -- 98 % 98 %   Oxygen Saturation (Exercise) 95 % 96 % -- 97 % 96 %   Oxygen Saturation (Exit) 96 % 97 % -- 97 % 95 %   Rating of Perceived Exertion (Exercise) 14 13 -- 15 14   Perceived Dyspnea (Exercise) 2 2 -- 2 2   Symptoms SOB, coughing SOB, coughing -- -- SOB   Comments walk test results first full day of exercise -- -- --   Duration -- Progress to 30 minutes of  aerobic without signs/symptoms of physical distress Progress to 30 minutes of  aerobic without signs/symptoms of physical distress Progress to 30 minutes of  aerobic without signs/symptoms of physical distress Continue with 30 min of aerobic exercise without signs/symptoms of physical distress.   Intensity -- THRR unchanged THRR unchanged THRR unchanged THRR unchanged     Progression   Progression -- Continue to progress workloads to maintain intensity without signs/symptoms of physical distress. Continue to progress workloads to maintain intensity without signs/symptoms of physical distress. Continue to progress workloads to maintain intensity without signs/symptoms of physical distress. Continue to progress workloads to maintain intensity without signs/symptoms of physical distress.   Average METs -- 2.32 2.32 2.96 3.9     Resistance Training   Training Prescription -- Yes Yes Yes Yes   Weight -- 3 lb 3 lb 3 lb 3 lb   Reps -- 10-15 10-15 10-15 10-15     Interval Training   Interval Training -- No No No No     Treadmill   MPH -- 2.2 2.2 2.5 2.7   Grade -- 0.5 0.5 2 2.5   Minutes -- _0 METs -- 2.84 2.84 3.6 4     REL-XR   Level -- -- -- 2 4   Minutes -- -- --  15 15   METs -- -- -- 2.6 3.8     T5 Nustep   Level -- 2  2 2 --   Minutes -- 15 15 15 --   METs -- 1.8 1.8 1.9 --     Home Exercise Plan   [facility]  Exercising 2 days/wk with [facility] study on off days of rehab [facility] (comment)  Exercising 2 days/wk with [facility] study on off days of rehab [facility] (comment)  Exercising 2 days/wk with [facility] PT study on off days of rehab   Frequency -- -- Add 2 additional days to program exercise sessions. Add 2 additional days to program exercise sessions. Add 2 additional days to program exercise sessions.   Initial Home Exercises Provided -- -- 09/21/19 09/21/19 09/21/19   Row Name 10/29/19 0900 11/11/19 1500           Response to Exercise   Blood Pressure (Admit) 110/72 114/74      Blood Pressure (Exercise) 142/60 122/70      Blood Pressure (Exit) 108/66 118/70      Heart Rate (Admit) 104 bpm 84 bpm      Heart Rate (Exercise) 117 bpm 116 bpm      Heart Rate (Exit) 112 bpm 110 bpm      Oxygen Saturation (Admit) 98 % 98 %      Oxygen Saturation (Exercise) 97 % 95 %      Oxygen Saturation (Exit) 97 % 98 %      Rating of Perceived Exertion (Exercise) 14 13      Perceived Dyspnea (Exercise) 2 1      Symptoms -- SOB      Duration Continue with 30 min of aerobic exercise without signs/symptoms of physical distress. Continue with 30 min of aerobic exercise without signs/symptoms of physical distress.      Intensity THRR unchanged THRR unchanged        Progression   Progression Continue to progress workloads to maintain intensity without signs/symptoms of physical distress. Continue to progress workloads to maintain intensity without signs/symptoms of physical distress.      Average METs 4 3.3        Resistance Training   Training Prescription Yes Yes      Weight 6 lb 6 lb      Reps 10-15 10-15        Interval Training   Interval Training No No        Treadmill   MPH 2.7 2.7      Grade 2 2.5      Minutes 15 15      METs 4 4         REL-XR   Level 4 4      Minutes 15 15      METs 4 3.2        T5 Nustep   Level -- 4      Minutes -- 15      METs -- 2        Home Exercise Plan   Pl[facility] to [facility] at [facility])  Exercising 2 days/wk with [facility] PT study on off days of rehab      Frequency -- Add 2 additional days to program exercise sessions.      Initial Home Exercises Provided -- 09/21/19             Exercise Comments:     Exercise Goals and Review:  Exercise Goals    Row Name 09/10/19 0916             Exercise Goals   Increase Physical Activity Yes       Intervention Provide advice, education, support and counseling about physical activity/exercise needs.;Develop an individualized exercise prescription for aerobic and resistive training based on initial evaluation findings, risk stratification, comorbidities and participant's personal goals.       Expected Outcomes Short Term: Attend rehab on a regular basis to increase amount of physical activity.;Long Term: Add in home exercise to make exercise part of routine and to increase amount of physical activity.;Long Term: Exercising regularly at least 3-5 days a week.       Increase Strength and Stamina Yes       Intervention Provide advice, education, support and counseling about physical activity/exercise needs.;Develop an individualized exercise prescription for aerobic and resistive training based on initial evaluation findings, risk stratification, comorbidities and participant's personal goals.       Expected Outcomes Short Term: Increase workloads from initial exercise prescription for resistance, speed, and METs.;Short Term: Perform resistance training exercises routinely during rehab and add in resistance training at home;Long Term: Improve cardiorespiratory fitness, muscular endurance and strength as measured by increased METs and functional capacity (6MWT)       Able to understand and use rate of perceived exertion (RPE)  scale Yes       Intervention Provide education and explanation on how to use RPE scale       Expected Outcomes Short Term: Able to use RPE daily in rehab to express subjective intensity level;Long Term:  Able to use RPE to guide intensity level when exercising independently       Able to understand and use Dyspnea scale Yes       Intervention Provide education and explanation on how to use Dyspnea scale       Expected Outcomes Short Term: Able to use Dyspnea scale daily in rehab to express subjective sense of shortness of breath during exertion;Long Term: Able to use Dyspnea scale to guide intensity level when exercising independently       Knowledge and understanding of Target Heart Rate Range (THRR) Yes       Intervention Provide education and explanation of THRR including how the numbers were predicted and where they are located for reference       Expected Outcomes Short Term: Able to state/look up THRR;Short Term: Able to use daily as guideline for intensity in rehab;Long Term: Able to use THRR to govern intensity when exercising independently       Able to check pulse independently Yes       Intervention Provide education and demonstration on how to check pulse in carotid and radial arteries.;Review the importance of being able to check your own pulse for safety during independent exercise       Expected Outcomes Short Term: Able to explain why pulse checking is important during independent exercise;Long Term: Able to check pulse independently and accurately       Understanding of Exercise Prescription Yes       Intervention Provide education, explanation, and written materials on patient's individual exercise prescription       Expected Outcomes Short Term: Able to explain program exercise prescription;Long Term: Able to explain home exercise prescription to exercise independently              Exercise Goals Re-Evaluation :  Exercise Goals Re-Evaluation      Row Name 09/14/19 1720 09/21/19  1737 09/29/19 0851 10/14/19 1011 10/21/19 1734     Exercise Goal Re-Evaluation   Exercise Goals Review Increase Physical Activity;Able to understand and use rate of perceived exertion (RPE) scale;Knowledge and understanding of Target Heart Rate Range (THRR);Understanding of Exercise Prescription;Increase Strength and Stamina;Able to understand and use Dyspnea scale;Able to check pulse independently Increase Physical Activity;Able to understand and use rate of perceived exertion (RPE) scale;Knowledge and understanding of Target Heart Rate Range (THRR);Understanding of Exercise Prescription;Increase Strength and Stamina;Able to understand and use Dyspnea scale;Able to check pulse independently Increase Physical Activity;Able to understand and use rate of perceived exertion (RPE) scale;Knowledge and understanding of Target Heart Rate Range (THRR);Understanding of Exercise Prescription;Increase Strength and Stamina;Able to understand and use Dyspnea scale;Able to check pulse independently Increase Physical Activity;Increase Strength and Stamina;Understanding of Exercise Prescription Increase Physical Activity;Increase Strength and Stamina   Comments Reviewed RPE and dyspnea scales, THR and program prescription with pt today.  Pt voiced understanding and was given a copy of goals to take home. Reviewed home exercise with pt today.  Pt plans to exercise 2 days a week with [facility] study of off days of rehab for exercise.  Reviewed THR, pulse, RPE, sign and symptoms, pulse oximetery and when to call 911 or MD.  Also discussed weather considerations and indoor options.  Pt voiced understanding. Jacalynn is doing well in rehab. She has already increased her treadmill speed to 2.5 and incline to 2%. She is adjusting well to the exercise machines. O2 sats are maintained well. Will continue to monitor progress. Macel has continued to do well in rehab.  She now on level 4 for the XR and up to 2.7 mph on the treadmill.  We will  continue to monitor her progress. [facility] is going to start another PT program study for COVID 19. Her classes start in November so she will go there on her off days from [facility].   Expected Outcomes Short: Use RPE daily to regulate intensity. Long: Follow program prescription in THR. Short: Continue to attend pulmonary rehab with consistent attendence and [facility] PT study on off days of rehab. Long: Improve SOB and be able to maintain an idependent exercise program following Pulmonary Rehab. Short: Continue to increase workloads Long: Progress overall MET level/ increase strength and stamina Short: Try 4 lb hand weights  Long: Continue to improve stamina Short: Start PT at [facility]. Long: Maintain exercise at [facility] and PT at rehab.   Row Name 10/29/19 0906 11/11/19 1525 11/30/19 1733         Exercise Goal Re-Evaluation   Exercise Goals Review Increase Physical Activity;Increase Strength and Stamina Increase Physical Activity;Increase Strength and Stamina;Understanding of Exercise Prescription Increase Physical Activity;Increase Strength and Stamina;Understanding of Exercise Prescription     Comments Basia is progressing well and averaging 4 METS and oxygen levels stay in high 90s.  She uses 6 lb weights for strength work.  Staff will monitor progress. Doreather continues to do well in rehab.  She is up to level 4 on the T5 NuStep and 2.5% grade on the treadmill.  We will continue to monitor his progress. Today is Miabella's first day back after missing 2 weeks.  She is going slower than ususal on T5 and wil adjust TM as needed.     Expected Outcomes Short:  continue to exercise consistently Long:  build stamina and increase MET level Short: Continue to maintain 2.5% grade on treadmill  Long; Continue to improve stamina. Short:   get back to conssistent exercise Long: get back to previous level            Discharge Exercise Prescription (Final Exercise Prescription Changes):  Exercise Prescription Changes -  11/11/19 1500      Response to Exercise   Blood Pressure (Admit) 114/74    Blood Pressure (Exercise) 122/70    Blood Pressure (Exit) 118/70    Heart Rate (Admit) 84 bpm    Heart Rate (Exercise) 116 bpm    Heart Rate (Exit) 110 bpm    Oxygen Saturation (Admit) 98 %    Oxygen Saturation (Exercise) 95 %    Oxygen Saturation (Exit) 98 %    Rating of Perceived Exertion (Exercise) 13    Perceived Dyspnea (Exercise) 1    Symptoms SOB    Duration Continue with 30 min of aerobic exercise without signs/symptoms of physical distress.    Intensity THRR unchanged      Progression   Progression Continue to progress workloads to maintain intensity without signs/symptoms of physical distress.    Average METs 3.3      Resistance Training   Training Prescription Yes    Weight 6 lb    Reps 10-15      Interval Training   Interval Training No      Treadmill   MPH 2.7    Grade 2.5    Minutes 15    METs 4      REL-XR   Level 4    Minutes 15    METs 3.2      T5 Nustep   Level 4    Minutes 15    METs 2      Home Exercise Plan   [facility]   Exercising 2 days/wk with [employer] [facility] study on off days of rehab   Frequency Add 2 additional days to program exercise sessions.    Initial Home Exercises Provided 09/21/19           Nutrition:  Target Goals: Understanding of nutrition guidelines, daily intake of sodium <1500mg, cholesterol <200mg, calories 30% from fat and 7% or less from saturated fats, daily to have 5 or more servings of fruits and vegetables.  Education: All About Nutrition: -Group instruction provided by verbal, written material, interactive activities, discussions, models, and posters to present general guidelines for heart healthy nutrition including fat, fiber, MyPlate, the role of sodium in heart healthy nutrition, utilization of the nutrition label, and utilization of this knowledge for meal planning. Follow up email sent as  well. Written material given at graduation.   Biometrics:  Pre Biometrics - 09/10/19 0918      Pre Biometrics   Height 5' 5.6" (1.666 m)    Weight 206 lb 4.8 oz (93.6 kg)    BMI (Calculated) 33.71    Single Leg Stand 14 seconds            Nutrition Therapy Plan and Nutrition Goals:  Nutrition Therapy & Goals - 09/22/19 1210      Nutrition Therapy   Diet heart healthy, low Na, Pulmonary MNT    Protein (specify units) 75g    Fiber 25 grams    Whole Grain Foods 3 servings    Saturated Fats 12 max. grams    Fruits and Vegetables 5 servings/day    Sodium 1.5 grams      Personal Nutrition Goals   Nutrition Goal ST: Power bowls and buddhas LT: improve ADL, improving fitness, improve energy (  changes on time of day, highs of 5/10)    Comments B: cereal or herberlife shake L: sandwich and fruit  - white bread S: granola bar or goldfish D: hello fresh meal, breakfast for dinner, or takeout. Feels SOB when eating and for a while after. Drinks: herbalife tea and water - drinks all day. Discussed heart healthy eating and meal planning. discussed pulmonary MNT.      Intervention Plan   Intervention Prescribe, educate and counsel regarding individualized specific dietary modifications aiming towards targeted core components such as weight, hypertension, lipid management, diabetes, heart failure and other comorbidities.;Nutrition handout(s) given to patient.    Expected Outcomes Short Term Goal: Understand basic principles of dietary content, such as calories, fat, sodium, cholesterol and nutrients.;Short Term Goal: A plan has been developed with personal nutrition goals set during dietitian appointment.;Long Term Goal: Adherence to prescribed nutrition plan.           Nutrition Assessments:  Nutrition Assessments - 09/03/19 1050      MEDFICTS Scores   Pre Score 21          MEDIFICTS Score Key:  ?70 Need to make dietary changes   40-70 Heart Healthy Diet  ? 40 Therapeutic Level  Cholesterol Diet   Picture Your Plate Scores:  <40 Unhealthy dietary pattern with much room for improvement.  41-50 Dietary pattern unlikely to meet recommendations for good health and room for improvement.  51-60 More healthful dietary pattern, with some room for improvement.   >60 Healthy dietary pattern, although there may be some specific behaviors that could be improved.   Nutrition Goals Re-Evaluation:  Nutrition Goals Re-Evaluation    Row Name 10/28/19 1728 11/30/19 1733           Goals   Nutrition Goal ST: Power bowls and buddha bowls for lunch, try whole wheat bread LT: Whole wheat bread 1/2 the time, eat 8 servings of fruits and vegetables, meet protein and calorie needs --      Comment She reports trying to eat healthy at lunch - Ex: leftover chicken, sandwich with cucumbers (white wheat). She also is drining more water. She reports no other changes at this time. Naja is doing Hello Fresh for dinners.  She is still working on incorporating more fruits and vegetables.  She is drinking more water.      Expected Outcome ST: Power bowls and buddha bowls for lunch, try whole wheat bread LT: Whole wheat bread 1/2 the time, eat 8 servings of fruits and vegetables, meet protein and calorie needs Short: continue to work on getting more fruits and veggies Long:  get full 8 servings of fruits and veggies             Nutrition Goals Discharge (Final Nutrition Goals Re-Evaluation):  Nutrition Goals Re-Evaluation - 11/30/19 1733      Goals   Comment Miamarie is doing Hello Fresh for dinners.  She is still working on incorporating more fruits and vegetables.  She is drinking more water.    Expected Outcome Short: continue to work on getting more fruits and veggies Long:  get full 8 servings of fruits and veggies           Psychosocial: Target Goals: Acknowledge presence or absence of significant depression and/or stress, maximize coping skills, provide positive support system.  Participant is able to verbalize types and ability to use techniques and skills needed for reducing stress and depression.   Education: Stress, Anxiety, and Depression - Group verbal   and visual presentation to define topics covered.  Reviews how body is impacted by stress, anxiety, and depression.  Also discusses healthy ways to reduce stress and to treat/manage anxiety and depression.  Written material given at graduation.   Education: Sleep Hygiene -Provides group verbal and written instruction about how sleep can affect your health.  Define sleep hygiene, discuss sleep cycles and impact of sleep habits. Review good sleep hygiene tips.    Initial Review & Psychosocial Screening:  Initial Psych Review & Screening - 09/03/19 1043      Initial Review   Current issues with Current Anxiety/Panic;Current Stress Concerns;Current Sleep Concerns    Source of Stress Concerns Chronic Illness;Family;Unable to participate in former interests or hobbies;Unable to perform yard/household activities    Comments Frustration form not being able to go and do from dealing with COVID recovery over last year, meds help with sleep and cough, mother just diagnosised with breast cancer this month      Family Dynamics   Good Support System? Yes   husband, mother and father, kids (daughter just moved to [place])     Barriers   Psychosocial barriers to participate in program There are no identifiable barriers or psychosocial needs.;The patient should benefit from training in stress management and relaxation.;Psychosocial barriers identified (see note)      Screening Interventions   Interventions Encouraged to exercise;Provide feedback about the scores to participant;To provide support and resources with identified psychosocial needs    Expected Outcomes Short Term goal: Utilizing psychosocial counselor, staff and physician to assist with identification of specific Stressors or current issues interfering with  healing process. Setting desired goal for each stressor or current issue identified.;Long Term Goal: Stressors or current issues are controlled or eliminated.;Short Term goal: Identification and review with participant of any Quality of Life or Depression concerns found by scoring the questionnaire.;Long Term goal: The participant improves quality of Life and PHQ9 Scores as seen by post scores and/or verbalization of changes           Quality of Life Scores:  Scores of 19 and below usually indicate a poorer quality of life in these areas.  A difference of  2-3 points is a clinically meaningful difference.  A difference of 2-3 points in the total score of the Quality of Life Index has been associated with significant improvement in overall quality of life, self-image, physical symptoms, and general health in studies assessing change in quality of life.  PHQ-9: Recent Review Flowsheet Data    Depression screen PHQ 2/9 09/10/2019 01/05/2019 12/20/2017   Decreased Interest 1 0 2   Down, Depressed, Hopeless 0 0 1   PHQ - 2 Score 1 0 3   Altered sleeping 1 - 2   Tired, decreased energy 2 - 2   Change in appetite 0 - 0   Feeling bad or failure about yourself  0 - 0   Trouble concentrating 0 - 0   Moving slowly or fidgety/restless 0 - 0   Suicidal thoughts 0 - 0   PHQ-9 Score 4 - 7   Difficult doing work/chores Somewhat difficult - -     Interpretation of Total Score  Total Score Depression Severity:  1-4 = Minimal depression, 5-9 = Mild depression, 10-14 = Moderate depression, 15-19 = Moderately severe depression, 20-27 = Severe depression   Psychosocial Evaluation and Intervention:  Psychosocial Evaluation - 09/03/19 1052      Psychosocial Evaluation & Interventions   Interventions Stress management education;Encouraged to exercise   with the program and follow exercise prescription    Comments 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  She was diganoised a year ago and  is still recovering.  She still is having extreme SOB and hoarseness, and extreme fatigue.  She wants to be able to go into work and get out of the house to be able to do more and breathe better.  She is originally from [place] and went to [place] for school. Her husband works for school and her family is now in [place], [place].  She continues to struggle with some brain fog but it is starting to clear.  This has all really been frustrating for her as she is used to being able to just go and do.  She is hoping to be able to come to class after her work day so that she can continue to work as much as possible.  Her family has also just found out that her mom has breast cancer.  She is looking forward to feeling better.    Expected Outcomes Short: Attend rehab to improve stamina and SOB  Long: Continue to stay positive and    Continue Psychosocial Services  Follow up required by staff           Psychosocial Re-Evaluation:  Psychosocial Re-Evaluation    [place] Name 10/21/19 1729             Psychosocial Re-Evaluation   Current issues with Current Stress Concerns       Comments She is stressed that she is not back to normal since she has had COVID. Yan is able to keep coming to rehab and it is improving her mood. Her job has been patient with her and she is working full time at home.       Expected Outcomes Short: Continue to exercise regularly to support mental health and notify staff of any changes. Long: maintain mental health and well being through teaching of rehab or prescribed medications independently.       Interventions Encouraged to attend Pulmonary Rehabilitation for the exercise       Continue Psychosocial Services  Follow up required by staff              Psychosocial Discharge (Final Psychosocial Re-Evaluation):  Psychosocial Re-Evaluation - 10/21/19 1729      Psychosocial Re-Evaluation   Current issues with Current Stress Concerns    Comments She is stressed that she is not  back to normal since she has had COVID. Sion is able to keep coming to rehab and it is improving her mood. Her job has been patient with her and she is working full time at home.    Expected Outcomes Short: Continue to exercise regularly to support mental health and notify staff of any changes. Long: maintain mental health and well being through teaching of rehab or prescribed medications independently.    Interventions Encouraged to attend Pulmonary Rehabilitation for the exercise    Continue Psychosocial Services  Follow up required by staff           Education: Education Goals: Education classes will be provided on a weekly basis, covering required topics. Participant will state understanding/return demonstration of topics presented.  Learning Barriers/Preferences:  Learning Barriers/Preferences - 09/03/19 1042      Learning Barriers/Preferences   Learning Barriers Sight   glasses to drive   Learning Preferences Skilled Demonstration           General Pulmonary  Education Topics:  Infection Prevention: - Provides verbal and written material to individual with discussion of infection control including proper hand washing and proper equipment cleaning during exercise session.   Pulmonary Rehab from 09/30/2019 in [facility]  Date 09/10/19  Educator JH  Instruction Review Code 1- Verbalizes Understanding      Falls Prevention: - Provides verbal and written material to individual with discussion of falls prevention and safety.   Pulmonary Rehab from 09/30/2019 in [facility]  Date 09/10/19  Educator JH  Instruction Review Code 1- Verbalizes Understanding      Chronic Lung Disease Review: - Group verbal instruction with posters, models, PowerPoint presentations and videos,  to review new updates, new respiratory medications, new advancements in procedures and treatments. Providing information on websites and "800" numbers for continued  self-education. Includes information about supplement oxygen, available portable oxygen systems, continuous and intermittent flow rates, oxygen safety, concentrators, and Medicare reimbursement for oxygen. Explanation of Pulmonary Drugs, including class, frequency, complications, importance of spacers, rinsing mouth after steroid MDI's, and proper cleaning methods for nebulizers. Review of basic lung anatomy and physiology related to function, structure, and complications of lung disease. Review of risk factors. Discussion about methods for diagnosing sleep apnea and types of masks and machines for OSA. Includes a review of the use of types of environmental controls: home humidity, furnaces, filters, dust mite/pet prevention, HEPA vacuums. Discussion about weather changes, air quality and the benefits of nasal washing. Instruction on Warning signs, infection symptoms, calling MD promptly, preventive modes, and value of vaccinations. Review of effective airway clearance, coughing and/or vibration techniques. Emphasizing that all should Create an Action Plan. Written material given at graduation.   Pulmonary Rehab from 09/30/2019 in [facility]  Date 09/10/19  Instruction Review Code 3- Needs Reinforcement  [need identified]      AED/CPR: - Group verbal and written instruction with the use of models to demonstrate the basic use of the AED with the basic ABC's of resuscitation.    Anatomy and Cardiac Procedures: - Group verbal and visual presentation and models provide information about basic cardiac anatomy and function. Reviews the testing methods done to diagnose heart disease and the outcomes of the test results. Describes the treatment choices: Medical Management, Angioplasty, or Coronary Bypass Surgery for treating various heart conditions including Myocardial Infarction, Angina, Valve Disease, and Cardiac Arrhythmias.  Written material given at graduation.   Medication  Safety: - Group verbal and visual instruction to review commonly prescribed medications for heart and lung disease. Reviews the medication, class of the drug, and side effects. Includes the steps to properly store meds and maintain the prescription regimen.  Written material given at graduation.   Pulmonary Rehab from 09/30/2019 in [facility]  Date 09/16/19  Educator sb  Instruction Review Code 1- Verbalizes Understanding      Other: -Provides group and verbal instruction on various topics (see comments)   Knowledge Questionnaire Score:    Core Components/Risk Factors/Patient Goals at Admission:  Personal Goals and Risk Factors at Admission - 09/10/19 0918      Core Components/Risk Factors/Patient Goals on Admission    Weight Management Yes;Obesity;Weight Loss    Intervention Weight Management: Develop a combined nutrition and exercise program designed to reach desired caloric intake, while maintaining appropriate intake of nutrient and fiber, sodium and fats, and appropriate energy expenditure required for the weight goal.;Weight Management: Provide education and appropriate resources to help   participant work on and attain dietary goals.;Weight Management/Obesity: Establish reasonable short term and long term weight goals.;Obesity: Provide education and appropriate resources to help participant work on and attain dietary goals.    Admit Weight 206 lb 4.8 oz (93.6 kg)    Goal Weight: Short Term 200 lb (90.7 kg)    Goal Weight: Long Term 195 lb (88.5 kg)    Expected Outcomes Short Term: Continue to assess and modify interventions until short term weight is achieved;Long Term: Adherence to nutrition and physical activity/exercise program aimed toward attainment of established weight goal;Weight Loss: Understanding of general recommendations for a balanced deficit meal plan, which promotes 1-2 lb weight loss per week and includes a negative energy balance of [phone]  kcal/d;Understanding recommendations for meals to include 15-35% energy as protein, 25-35% energy from fat, 35-60% energy from carbohydrates, less than 21m of dietary cholesterol, 20-35 gm of total fiber daily;Understanding of distribution of calorie intake throughout the day with the consumption of 4-5 meals/snacks    Improve shortness of breath with ADL's Yes    Intervention Provide education, individualized exercise plan and daily activity instruction to help decrease symptoms of SOB with activities of daily living.    Expected Outcomes Short Term: Improve cardiorespiratory fitness to achieve a reduction of symptoms when performing ADLs;Long Term: Be able to perform more ADLs without symptoms or delay the onset of symptoms           Education:Diabetes - Individual verbal and written instruction to review signs/symptoms of diabetes, desired ranges of glucose level fasting, after meals and with exercise. Acknowledge that pre and post exercise glucose checks will be done for 3 sessions at entry of program.   Know Your Numbers and Heart Failure: - Group verbal and visual instruction to discuss disease risk factors for cardiac and pulmonary disease and treatment options.  Reviews associated critical values for Overweight/Obesity, Hypertension, Cholesterol, and Diabetes.  Discusses basics of heart failure: signs/symptoms and treatments.  Introduces Heart Failure Zone chart for action plan for heart failure.  Written material given at graduation.   Core Components/Risk Factors/Patient Goals Review:   Goals and Risk Factor Review    Row Name 10/21/19 1726 11/30/19 1730           Core Components/Risk Factors/Patient Goals Review   Personal Goals Review Improve shortness of breath with ADL's Improve shortness of breath with ADL's      Review She is able to do some chores without being short of breath. When she vaccuums is when she gets a little short of breath.Spoke to patient about their  shortness of breath and what they can do to improve. Patient has been informed of breathing techniques when starting the program. Patient is informed to tell staff if they have had any med changes and that certain meds they are taking or not taking can be causing shortness of breath. NLylawas feeling better before getting the Covid vaccine.  She feels she's had a major setback.  She is back on albuterol and just finished prednisone.      Expected Outcomes Short: Attend [facility] regularly to improve shortness of breath with ADL's. Long: maintain independence with ADL's Short: get back to regular exercise Long: work up to previous levels             Core Components/Risk Factors/Patient Goals at Discharge (Final Review):   Goals and Risk Factor Review - 11/30/19 1730      Core Components/Risk Factors/Patient Goals Review   Personal Goals  Review Improve shortness of breath with ADL's    Review Syanne was feeling better before getting the Covid vaccine.  She feels she's had a major setback.  She is back on albuterol and just finished prednisone.    Expected Outcomes Short: get back to regular exercise Long: work up to previous levels           ITP Comments:  ITP Comments    Row Name 09/03/19 1059 09/10/19 0913 09/14/19 1718 09/22/19 1232 10/07/19 0555   ITP Comments Completed virtual orientation today.  EP evaluation is scheduled for Thurs 9/9 at 730am.  Documentation for diagnosis can be found in [facility] encounter 08/21/19. Completed 6MWT and gym orientation. Initial ITP created and sent for review to Dr. Mark Miller, Medical Director. First full day of exercise!  Patient was oriented to gym and equipment including functions, settings, policies, and procedures.  Patient's individual exercise prescription and treatment plan were reviewed.  All starting workloads were established based on the results of the 6 minute walk test done at initial orientation visit.  The plan for exercise progression was also  introduced and progression will be customized based on patient's performance and goals. Completed Initial RD Evaluation 30 Day review completed. Medical Director ITP review done, changes made as directed, and signed approval by Medical Director.   Row Name 11/04/19 0653 12/02/19 1017         ITP Comments 30 Day review completed. Medical Director ITP review done, changes made as directed, and signed approval by Medical Director. 30 Day review completed. Medical Director ITP review done, changes made as directed, and signed approval by Medical Director.             Comments:  

## 2019-12-02 NOTE — Progress Notes (Signed)
Daily Session Note  Patient Details  Name: Cassie [name] MRN: [id number] Date of Birth: 04-17-1970 Referring Provider:     Pulmonary Rehab from 09/10/2019 in [facility] Cardiac and Pulmonary Rehab  Referring Provider Marshell Garfinkel MD      Encounter Date: 12/02/2019  Check In:  Session Check In - 12/02/19 1720      Check-In   Supervising physician immediately available to respond to emergencies See telemetry face sheet for immediately available ER MD    Location [facility]-Cardiac & Pulmonary Rehab    Staff Present Birdie Sons, MPA, Elveria Rising, BA, ACSM CEP, Exercise Physiologist;Kara Eliezer Bottom, MS Exercise Physiologist    Virtual Visit No    Medication changes reported     No    Fall or balance concerns reported    No    Warm-up and Cool-down Performed on first and last piece of equipment    Resistance Training Performed Yes    VAD Patient? No    PAD/SET Patient? No      Pain Assessment   Currently in Pain? No/denies              Social History   Tobacco Use  Smoking Status Former Smoker  . Packs/day: 0.10  . Years: 5.00  . Pack years: 0.50  . Types: Cigarettes  . Quit date: 02/21/2007  . Years since quitting: 12.7  Smokeless Tobacco Never Used  Tobacco Comment   social smoker    Goals Met:  Independence with exercise equipment Exercise tolerated well No report of cardiac concerns or symptoms Strength training completed today  Goals Unmet:  Not Applicable  Comments: .   Dr. Emily Filbert is Medical Director for [place] and [facility].

## 2019-12-03 ENCOUNTER — Encounter: Payer: BC Managed Care – PPO | Admitting: *Deleted

## 2019-12-03 DIAGNOSIS — R0600 Dyspnea, unspecified: Secondary | ICD-10-CM

## 2019-12-03 DIAGNOSIS — R0609 Other forms of dyspnea: Secondary | ICD-10-CM

## 2019-12-03 NOTE — Progress Notes (Signed)
Daily Session Note  Patient Details  Name: TYRHONDA GEORGIADES MRN: [id number] Date of Birth: 1970-12-22 Referring Provider:     Pulmonary Rehab from 09/10/2019 in [facility] Cardiac and Pulmonary Rehab  Referring Provider Marshell Garfinkel MD      Encounter Date: 12/03/2019  Check In:  Session Check In - 12/03/19 1726      Check-In   Supervising physician immediately available to respond to emergencies See telemetry face sheet for immediately available ER MD    Location [facility]-Cardiac & Pulmonary Rehab    Staff Present Renita Papa, RN BSN;Joseph [address] [place], [place], ACSM CEP, Exercise Physiologist;Kara Eliezer Bottom, MS Exercise Physiologist    Virtual Visit No    Medication changes reported     No    Fall or balance concerns reported    No    Warm-up and Cool-down Performed on first and last piece of equipment    Resistance Training Performed Yes    VAD Patient? No    PAD/SET Patient? No      Pain Assessment   Currently in Pain? No/denies              Social History   Tobacco Use  Smoking Status Former Smoker  . Packs/day: 0.10  . Years: 5.00  . Pack years: 0.50  . Types: Cigarettes  . Quit date: 02/21/2007  . Years since quitting: 12.7  Smokeless Tobacco Never Used  Tobacco Comment   social smoker    Goals Met:  Independence with exercise equipment Exercise tolerated well No report of cardiac concerns or symptoms Strength training completed today  Goals Unmet:  Not Applicable  Comments: Pt able to follow exercise prescription today without complaint.  Will continue to monitor for progression.    Dr. Emily Filbert is Medical Director for [place] and [facility].

## 2019-12-04 ENCOUNTER — Ambulatory Visit: Payer: BC Managed Care – PPO | Admitting: Pulmonary Disease

## 2019-12-04 ENCOUNTER — Other Ambulatory Visit: Payer: Self-pay

## 2019-12-04 ENCOUNTER — Encounter: Payer: Self-pay | Admitting: Pulmonary Disease

## 2019-12-04 VITALS — BP 130/80 | HR 93 | Temp 98.0°F | Ht 64.0 in | Wt 209.8 lb

## 2019-12-04 DIAGNOSIS — R058 Other specified cough: Secondary | ICD-10-CM

## 2019-12-04 DIAGNOSIS — Z8616 Personal history of COVID-19: Secondary | ICD-10-CM | POA: Diagnosis not present

## 2019-12-04 DIAGNOSIS — R490 Dysphonia: Secondary | ICD-10-CM

## 2019-12-04 MED ORDER — PREDNISONE 20 MG PO TABS: 20.0000 mg | ORAL_TABLET | Freq: Every day | ORAL | 0 refills | Status: DC

## 2019-12-04 MED ORDER — GABAPENTIN 600 MG PO TABS: 600.0000 mg | ORAL_TABLET | Freq: Three times a day (TID) | ORAL | 0 refills | Status: DC

## 2019-12-04 NOTE — Patient Instructions (Signed)
Increase Neurontin to 600 mg twice daily Continue the Protonix and Pepcid Continue pulmonary rehab We will call in a prescription for prednisone 40 mg a day for 5 days.  Take Prilosec you have any recurrence of symptoms after second dose of vaccination  Follow-up in 1 to 2 months.

## 2019-12-04 NOTE — Progress Notes (Signed)
ROCHELL PUETT    [id number]    06/19/1970  Primary Care Physician:Gutierrez, Garlon Hatchet, MD  Referring Physician: Ria Bush, MD [address] [place],  [place] [address]  Chief complaint: Follow-up for chronic cough, post Covid  HPI: 49 year old with allergies, asthma, Covid infection in August 2020 Has prolonged long-haul Covid presenting as cough, fatigue, exertion, hoarseness of voice, cough.  She is seen multiple pulmonary providers including Dr. Melvyn Novas in this office, [facility] and [facility] with negative high-res CT in December 2020, PFTs at [facility] pulmonary was reportedly normal per report.  She had a borderline elevated D-dimer in early July at [facility] and CTA showed mild groundglass opacities with focal fibrosis [report below]  Has some response from high-dose steroids in the past.  Inhalers have not helped.  She has been treated well for postnasal drip, GERD without any change, also evaluated by Dr. Joya Gaskins ENT at [facility] for vocal cord issues.  Laryngoscopy showed mild laryngeal edema and complete closure.  She is is being treated with Elavil, Neurontin.  Also finished treatment with speech therapy at [facility] for irritable larynx with some improvement in hoarseness of voice.  She follows with Dr. Percival Spanish, cardiology for palpitations and has been started on calcium channel blocker  Pets: No pets Occupation: Works as a Retail buyer Exposures: No known exposures.  No mold, hot tub, Jacuzzi.  No feather pillows or comforters Smoking history: Never smoker Travel history: Originally from [place]. Relevant family history: No significant family issue of lung disease.  Interim history: She is making some progress with improvement in cough.  She had a recrudescence of symptoms after her first dose of COVID-19 vaccine with increasing cough, chest tightness, fevers.  She was evaluated in the ED and given prednisone which improved symptoms  chest tightness but she continues to have significant cough with hoarseness  Has been evaluated by neurology for brain fog.  Did not require additional treatment  Outpatient Encounter Medications as of 12/04/2019  Medication Sig  . albuterol (PROVENTIL) (2.5 MG/3ML) 0.083% nebulizer solution Take 3 mLs (2.5 mg total) by nebulization every 6 (six) hours as needed for wheezing or shortness of breath.  Marland Kitchen amitriptyline (ELAVIL) 50 MG tablet Take 50 mg by mouth at bedtime.  . Biotin 5 MG CAPS Take 1 capsule (5 mg total) by mouth daily.  . chlorpheniramine (CHLOR-TRIMETON) 4 MG tablet Take by mouth. 4 times daily  . cholecalciferol (VITAMIN D3) 25 MCG (1000 UNIT) tablet Take 1,000 Units by mouth daily.  Marland Kitchen Dextromethorphan Polistirex (DELSYM PO) Take by mouth daily.  Marland Kitchen diltiazem (CARDIZEM CD) 240 MG 24 hr capsule Take 1 capsule (240 mg total) by mouth daily.  . famotidine (PEPCID) 20 MG tablet One after supper  . gabapentin (NEURONTIN) 300 MG capsule Take 1 capsule (300 mg total) by mouth 2 (two) times daily.  Marland Kitchen ipratropium (ATROVENT) 0.02 % nebulizer solution Inhale into the lungs.  . pantoprazole (PROTONIX) 40 MG tablet Take 1 tablet (40 mg total) by mouth daily. Take 30-60 min before first meal of the day  . Respiratory Therapy Supplies (FLUTTER) DEVI Use as directed   No facility-administered encounter medications on file as of 12/04/2019.    Physical Exam: Blood pressure 130/80, pulse 93, temperature 98 F (36.7 C), temperature source Oral, height 5\' 4"  (1.626 m), weight 209 lb 12.8 oz (95.2 kg), last menstrual period 01/27/2000, SpO2 97 %. Gen:  No acute distress HEENT:  EOMI, sclera anicteric Neck:     No masses; no thyromegaly Lungs:    Clear to auscultation bilaterally; normal respiratory effort CV:         Regular rate and rhythm; no murmurs Abd:      + bowel sounds; soft, non-tender; no palpable masses, no distension Ext:    No edema; adequate peripheral perfusion Skin:       Warm and dry; no rash Neuro: alert and oriented x 3 Psych: normal mood and affect  Data Reviewed: Imaging: High-resolution CT 12/10/2018-no evidence of fibrotic interstitial lung disease, mild air trapping  CTA report from [facility] 07/10/2019-Bilateral lower lobe dependent heterogenous ground glass opacities, favoring dependent subsegmental atelectasis. Focal ground glass opacity involving the medial basal segment of right lower lobe, favoring fibrosis. No suspicious pulmonary nodule. No pleural effusion or pneumothorax.   High-res CT 08/07/2019-no interstitial lung disease, mild air trapping, mild centrilobular nodularity in the apices I have reviewed the images personally.  I have reviewed the images personally  PFTs: 08/21/2019 FVC 3.49 [93%], FEV1 2.92 [97%], F/F 84, TLC 5.51 [105%], DLCO 24.90 [112%] Normal test  Labs: CBC 03/17/2019-WBC 8.6, eos 2.2%, absolute eosinophil count 189 IgE 03/17/2019-14  Sed rate 06/18/2019-14  Assessment:  Long-haul Covid Atypical presentation with mostly vocal cord issues, dysphonia, irritable larynx. Continue the Neurontin, Elavil and speech therapy.  She has not responded to inhalers in the past  Initial high-res CT was negative but the report from [facility] on 07/10/2019 is read as new groundglass opacities with focal fibrosis but on our repeat scan she does not have any interstitial lung disease  Has worsening cough, hoarseness after recent Covid first vaccination I will increase the Neurontin to 600 mg twice daily Continue albuterol as needed. She is concerned about worsening symptoms after the second dose.  I have called in 5 days of 40 mg prednisone to be held in reserve if she needs it.  GERD On Protonix, Pepcid  Plan/Recommendations: Increase Neurontin to 600 mg twice daily Continue Protonix, Pepcid, pulmonary rehab Prednisone 40 mg a day for 5 days in case she needs it after second dose of COVID-19 vaccine  Marshell Garfinkel  MD [facility] 12/04/2019, 3:05 PM  CC: Ria Bush, MD

## 2019-12-07 ENCOUNTER — Ambulatory Visit: Payer: BC Managed Care – PPO | Attending: Internal Medicine

## 2019-12-07 DIAGNOSIS — Z23 Encounter for immunization: Secondary | ICD-10-CM

## 2019-12-07 NOTE — Progress Notes (Signed)
   Covid-19 Vaccination Clinic  Name:  Cassie [name]    MRN: [id number] DOB: 06-16-1970  12/07/2019  [name] was observed post Covid-19 immunization for 30 minutes based on pre-vaccination screening without incident. She was provided with Vaccine Information Sheet and instruction to access the V-Safe system.   [name] was instructed to call 911 with any severe reactions post vaccine: Marland Kitchen Difficulty breathing  . Swelling of face and throat  . A fast heartbeat  . A bad rash all over body  . Dizziness and weakness   Immunizations Administered    Name Date Dose VIS Date Route   Pfizer COVID-19 Vaccine 12/07/2019  3:52 PM 0.3 mL 10/21/2019 Intramuscular   Manufacturer: [place]   Lot: [id number]   NDC: [address]-4

## 2019-12-10 ENCOUNTER — Other Ambulatory Visit: Payer: Self-pay

## 2019-12-10 ENCOUNTER — Encounter: Payer: BC Managed Care – PPO | Admitting: *Deleted

## 2019-12-10 DIAGNOSIS — R0600 Dyspnea, unspecified: Secondary | ICD-10-CM | POA: Diagnosis not present

## 2019-12-10 DIAGNOSIS — R0609 Other forms of dyspnea: Secondary | ICD-10-CM

## 2019-12-10 NOTE — Progress Notes (Signed)
Daily Session Note  Patient Details  Name: Cassie [name] MRN: [id number] Date of Birth: 09/05/1970 Referring Provider:   Flowsheet Row Pulmonary Rehab from 09/10/2019 in [facility] Cardiac and Pulmonary Rehab  Referring Provider Marshell Garfinkel MD      Encounter Date: 12/10/2019  Check In:  Session Check In - 12/10/19 1727      Check-In   Supervising physician immediately available to respond to emergencies See telemetry face sheet for immediately available ER MD    Location [facility]-Cardiac & Pulmonary Rehab    Staff Present Renita Papa, RN BSN;Joseph Tessie Fass RCP,RRT,BSRT    Virtual Visit No    Medication changes reported     No    Fall or balance concerns reported    No    Warm-up and Cool-down Performed on first and last piece of equipment    Resistance Training Performed Yes    VAD Patient? No    PAD/SET Patient? No      Pain Assessment   Currently in Pain? No/denies              Social History   Tobacco Use  Smoking Status Former Smoker  . Packs/day: 0.10  . Years: 5.00  . Pack years: 0.50  . Types: Cigarettes  . Quit date: 02/21/2007  . Years since quitting: 12.8  Smokeless Tobacco Never Used  Tobacco Comment   social smoker    Goals Met:  Independence with exercise equipment Exercise tolerated well No report of cardiac concerns or symptoms Strength training completed today  Goals Unmet:  Not Applicable  Comments: Pt able to follow exercise prescription today without complaint.  Will continue to monitor for progression.    Dr. Emily Filbert is Medical Director for [place] and [facility].

## 2019-12-11 ENCOUNTER — Other Ambulatory Visit: Payer: Self-pay | Admitting: Internal Medicine

## 2019-12-11 DIAGNOSIS — R058 Other specified cough: Secondary | ICD-10-CM

## 2019-12-11 MED ORDER — FAMOTIDINE 20 MG PO TABS: ORAL_TABLET | ORAL | 11 refills | Status: DC

## 2019-12-14 ENCOUNTER — Other Ambulatory Visit: Payer: Self-pay

## 2019-12-14 DIAGNOSIS — R0609 Other forms of dyspnea: Secondary | ICD-10-CM

## 2019-12-14 DIAGNOSIS — R0600 Dyspnea, unspecified: Secondary | ICD-10-CM | POA: Diagnosis not present

## 2019-12-14 NOTE — Progress Notes (Signed)
Daily Session Note  Patient Details  Name: Cassie [name] MRN: [id number] Date of Birth: 1970/10/21 Referring Provider:   Flowsheet Row Pulmonary Rehab from 09/10/2019 in [facility] Cardiac and Pulmonary Rehab  Referring Provider Marshell Garfinkel MD      Encounter Date: 12/14/2019  Check In:  Session Check In - 12/14/19 [name]      Check-In   Supervising physician immediately available to respond to emergencies See telemetry face sheet for immediately available ER MD    Location [facility]-Cardiac & Pulmonary Rehab    Staff Present Birdie Sons, MPA, Mauricia Area, BS, ACSM CEP, Exercise Physiologist;Kara Eliezer Bottom, MS Exercise Physiologist    Virtual Visit No    Medication changes reported     No    Fall or balance concerns reported    No    Warm-up and Cool-down Performed on first and last piece of equipment    Resistance Training Performed Yes    VAD Patient? No    PAD/SET Patient? No      Pain Assessment   Currently in Pain? No/denies              Social History   Tobacco Use  Smoking Status Former Smoker  . Packs/day: 0.10  . Years: 5.00  . Pack years: 0.50  . Types: Cigarettes  . Quit date: 02/21/2007  . Years since quitting: 12.8  Smokeless Tobacco Never Used  Tobacco Comment   social smoker    Goals Met:  Independence with exercise equipment Exercise tolerated well No report of cardiac concerns or symptoms Strength training completed today  Goals Unmet:  Not Applicable  Comments: Pt able to follow exercise prescription today without complaint.  Will continue to monitor for progression.    Dr. Emily Filbert is Medical Director for [place] and [facility].

## 2019-12-16 ENCOUNTER — Other Ambulatory Visit: Payer: Self-pay

## 2019-12-16 ENCOUNTER — Encounter: Payer: BC Managed Care – PPO | Admitting: *Deleted

## 2019-12-16 DIAGNOSIS — R0600 Dyspnea, unspecified: Secondary | ICD-10-CM | POA: Diagnosis not present

## 2019-12-16 DIAGNOSIS — R0609 Other forms of dyspnea: Secondary | ICD-10-CM

## 2019-12-16 NOTE — Progress Notes (Signed)
Daily Session Note  Patient Details  Name: Cassie [name] MRN: [id number] Date of Birth: 1970/10/03 Referring Provider:   Flowsheet Row Pulmonary Rehab from 09/10/2019 in [facility] Cardiac and Pulmonary Rehab  Referring Provider Marshell Garfinkel MD      Encounter Date: 12/16/2019  Check In:  Session Check In - 12/16/19 1714      Check-In   Supervising physician immediately available to respond to emergencies See telemetry face sheet for immediately available ER MD    Location [facility]-Cardiac & Pulmonary Rehab    Staff Present Renita Papa, RN BSN;Joseph Lou Miner, [place] Exercise Physiologist;Melissa Tilford Pillar RDN, LDN    Virtual Visit No    Medication changes reported     No    Fall or balance concerns reported    No    Warm-up and Cool-down Performed on first and last piece of equipment    Resistance Training Performed Yes    VAD Patient? No    PAD/SET Patient? No      Pain Assessment   Currently in Pain? No/denies              Social History   Tobacco Use  Smoking Status Former Smoker  . Packs/day: 0.10  . Years: 5.00  . Pack years: 0.50  . Types: Cigarettes  . Quit date: 02/21/2007  . Years since quitting: 12.8  Smokeless Tobacco Never Used  Tobacco Comment   social smoker    Goals Met:  Independence with exercise equipment Exercise tolerated well No report of cardiac concerns or symptoms Strength training completed today  Goals Unmet:  Not Applicable  Comments: Pt able to follow exercise prescription today without complaint.  Will continue to monitor for progression.    Dr. Emily Filbert is Medical Director for [place] and [facility].

## 2019-12-17 ENCOUNTER — Encounter: Payer: BC Managed Care – PPO | Admitting: *Deleted

## 2019-12-17 DIAGNOSIS — R0609 Other forms of dyspnea: Secondary | ICD-10-CM

## 2019-12-17 DIAGNOSIS — R0600 Dyspnea, unspecified: Secondary | ICD-10-CM | POA: Diagnosis not present

## 2019-12-17 NOTE — Progress Notes (Signed)
Daily Session Note  Patient Details  [name]: Cassie [name] MRN: [id number] Date of Birth: 11-12-1970 Referring Provider:   Flowsheet Row Pulmonary Rehab from 09/10/2019 in [facility] Cardiac and Pulmonary Rehab  Referring Provider Marshell Garfinkel MD      Encounter Date: 12/17/2019  Check In:  Session Check In - 12/17/19 1723      Check-In   Supervising physician immediately available to respond to emergencies See telemetry face sheet for immediately available ER MD    Location [facility]-Cardiac & Pulmonary Rehab    Staff Present Renita Papa, RN Moises Blood, BS, ACSM CEP, Exercise Physiologist;Kara Eliezer Bottom, MS Exercise Physiologist    Virtual Visit No    Medication changes reported     No    Fall or balance concerns reported    No    Warm-up and Cool-down Performed on first and last piece of equipment    Resistance Training Performed Yes    VAD Patient? No    PAD/SET Patient? No      Pain Assessment   Currently in Pain? No/denies              Social History   Tobacco Use  Smoking Status Former Smoker  . Packs/day: 0.10  . Years: 5.00  . Pack years: 0.50  . Types: Cigarettes  . Quit date: 02/21/2007  . Years since quitting: 12.8  Smokeless Tobacco Never Used  Tobacco Comment   social smoker    Goals Met:  Independence with exercise equipment Exercise tolerated well No report of cardiac concerns or symptoms Strength training completed today  Goals Unmet:  Not Applicable  Comments: Pt able to follow exercise prescription today without complaint.  Will continue to monitor for progression.   Cassie [name] 09/10/19 0913 12/17/19 1746       6 Minute Walk   Phase Initial Discharge    Distance 1163 feet 1490 feet    Distance % Change -- 28.1 %    Distance Feet Change -- 325 ft    Walk Time 6 minutes 6 minutes    # of Rest Breaks 0 0    MPH 2.2 2.82    METS 3.96 4.44    RPE 14 15    Perceived Dyspnea  2 2.5    VO2 Peak 13.86 15.55    Symptoms  Yes (comment) Yes (comment)    Comments SOB, coughing coughing    Resting HR 109 bpm 118 bpm    Resting BP 118/56 110/60    Resting Oxygen Saturation  97 % 98 %    Exercise Oxygen Saturation  during 6 min walk 95 % 95 %    Max Ex. HR 125 bpm 132 bpm    Max Ex. BP 144/74 130/80    2 Minute Post BP 132/70 124/78         Interval HR   1 Minute HR 123 121    2 Minute HR 120 132    3 Minute HR 118 129    4 Minute HR 116 126    5 Minute HR 118 126    6 Minute HR 125 129    2 Minute Post HR 111 122    Interval Heart Rate? Yes Yes         Interval Oxygen   Interval Oxygen? Yes Yes    Baseline Oxygen Saturation % 97 % 98 %    1 Minute Oxygen Saturation % 96 % 95 %  1 Minute Liters of Oxygen 0 L  Room Air 0 L    2 Minute Oxygen Saturation % 95 % 96 %    2 Minute Liters of Oxygen 0 L 0 L    3 Minute Oxygen Saturation % 95 % 96 %    3 Minute Liters of Oxygen 0 L 0 L    4 Minute Oxygen Saturation % 96 % 96 %    4 Minute Liters of Oxygen 0 L 0 L    5 Minute Oxygen Saturation % 96 % 96 %    5 Minute Liters of Oxygen 0 L 0 L    6 Minute Oxygen Saturation % 96 % 96 %    6 Minute Liters of Oxygen 0 L 0 L    2 Minute Post Oxygen Saturation % 96 % 96 %    2 Minute Post Liters of Oxygen 0 L --             Dr. Emily Filbert is Medical Director for [place] and [facility].

## 2019-12-21 ENCOUNTER — Other Ambulatory Visit: Payer: Self-pay

## 2019-12-21 DIAGNOSIS — R0600 Dyspnea, unspecified: Secondary | ICD-10-CM | POA: Diagnosis not present

## 2019-12-21 DIAGNOSIS — R0609 Other forms of dyspnea: Secondary | ICD-10-CM

## 2019-12-21 NOTE — Progress Notes (Signed)
Daily Session Note  Patient Details  Name: Cassie [name] MRN: [id number] Date of Birth: [birth year] Referring Provider:   Flowsheet Row Pulmonary Rehab from 09/10/2019 in [facility] Cardiac and Pulmonary Rehab  Referring Provider Marshell Garfinkel MD      Encounter Date: 12/21/2019  Check In:  Session Check In - 12/21/19 1707      Check-In   Supervising physician immediately available to respond to emergencies See telemetry face sheet for immediately available ER MD    Location [facility]-Cardiac & Pulmonary Rehab    Staff Present Birdie Sons, MPA, Mauricia Area, BS, ACSM CEP, Exercise Physiologist;Kara Eliezer Bottom, MS Exercise Physiologist    Virtual Visit No    Medication changes reported     No    Fall or balance concerns reported    No    Warm-up and Cool-down Performed on first and last piece of equipment    Resistance Training Performed Yes    VAD Patient? No    PAD/SET Patient? No      Pain Assessment   Currently in Pain? No/denies              Social History   Tobacco Use  Smoking Status Former Smoker   Packs/day: 0.10   Years: 5.00   Pack years: 0.50   Types: Cigarettes   Quit date: 02/21/2007   Years since quitting: 12.8  Smokeless Tobacco Never Used  Tobacco Comment   social smoker    Goals Met:  Independence with exercise equipment Exercise tolerated well No report of cardiac concerns or symptoms Strength training completed today  Goals Unmet:  Not Applicable  Comments: Pt able to follow exercise prescription today without complaint.  Will continue to monitor for progression.    Dr. Emily Filbert is Medical Director for [place] and [facility].

## 2019-12-30 ENCOUNTER — Encounter: Payer: Self-pay | Admitting: *Deleted

## 2019-12-30 DIAGNOSIS — R0609 Other forms of dyspnea: Secondary | ICD-10-CM

## 2019-12-30 NOTE — Progress Notes (Signed)
Pulmonary Individual Treatment Plan  Patient Details  Name: Cassie [name] MRN: [id number] Date of Birth: Oct 03, 1970 Referring Provider:   Flowsheet Row Pulmonary Rehab from 09/10/2019 in [facility] Cardiac and Pulmonary Rehab  Referring Provider Cassie Garfinkel MD      Initial Encounter Date:  Flowsheet Row Pulmonary Rehab from 09/10/2019 in [facility] Cardiac and Pulmonary Rehab  Date 09/10/19      Visit Diagnosis: DOE (dyspnea on exertion)  Patient's Home Medications on Admission:  Current Outpatient Medications:  .  albuterol (PROVENTIL) (2.5 MG/3ML) 0.083% nebulizer solution, Take 3 mLs (2.5 mg total) by nebulization every 6 (six) hours as needed for wheezing or shortness of breath., Disp: 150 mL, Rfl: 0 .  amitriptyline (ELAVIL) 50 MG tablet, Take 50 mg by mouth at bedtime., Disp: , Rfl:  .  Biotin 5 MG CAPS, Take 1 capsule (5 mg total) by mouth daily., Disp: , Rfl: 0 .  chlorpheniramine (CHLOR-TRIMETON) 4 MG tablet, Take by mouth. 4 times daily, Disp: , Rfl:  .  cholecalciferol (VITAMIN D3) 25 MCG (1000 UNIT) tablet, Take 1,000 Units by mouth daily., Disp: , Rfl:  .  Dextromethorphan Polistirex (DELSYM PO), Take by mouth daily., Disp: , Rfl:  .  diltiazem (CARDIZEM CD) 240 MG 24 hr capsule, Take 1 capsule (240 mg total) by mouth daily., Disp: 90 capsule, Rfl: 3 .  famotidine (PEPCID) 20 MG tablet, One after supper, Disp: 30 tablet, Rfl: 11 .  gabapentin (NEURONTIN) 300 MG capsule, Take 1 capsule (300 mg total) by mouth 2 (two) times daily., Disp: 180 capsule, Rfl: 3 .  gabapentin (NEURONTIN) 600 MG tablet, Take 1 tablet (600 mg total) by mouth 3 (three) times daily., Disp: 180 tablet, Rfl: 0 .  ipratropium (ATROVENT) 0.02 % nebulizer solution, Inhale into the lungs., Disp: , Rfl:  .  pantoprazole (PROTONIX) 40 MG tablet, Take 1 tablet (40 mg total) by mouth daily. Take 30-60 min before first meal of the day, Disp: 30 tablet, Rfl: 2 .  predniSONE (DELTASONE) 20 MG tablet, Take 1 tablet (20  mg total) by mouth daily with breakfast. Take 75m daily for 5 days, Disp: 10 tablet, Rfl: 0 .  Respiratory Therapy Supplies (FLUTTER) DEVI, Use as directed, Disp: 1 each, Rfl: 0  Past Medical History: Past Medical History:  Diagnosis Date  . Allergy   . Asthma   . Cardiac disease   . Diffuse cystic mastopathy   . GERD (gastroesophageal reflux disease)   . Guttate psoriasis   . H/O syncope    cards work-up WNL (Hochrein) thought 2/2 overmedication vs arrhythmias  . Hx of endometriosis    hysterectomy  . Hx of migraines    occasional; improved since hysterectomy  . Labral tear of shoulder 02/2014   R, pending surgery ([place] [facility]  . Rosacea    mild    Tobacco Use: Social History   Tobacco Use  Smoking Status Former Smoker  . Packs/day: 0.10  . Years: 5.00  . Pack years: 0.50  . Types: Cigarettes  . Quit date: 02/21/2007  . Years since quitting: 12.8  Smokeless Tobacco Never Used  Tobacco Comment   social smoker    Labs: Recent Review Flowsheet Data    Labs for ITP Cardiac and Pulmonary Rehab Latest Ref Rng & Units 08/12/2009 02/23/2011 02/16/2016 12/13/2017   Cholestrol 0 - 200 mg/dL 168 160 158 174   LDLCALC 0 - 99 mg/dL 89 88 82 94   HDL >39.00 mg/dL 41.60 50.60 50 59.10  Trlycerides 0.0 - 149.0 mg/dL 187.0(H) 106.0 128 108.0       Pulmonary Assessment Scores:  Pulmonary Assessment Scores    Row Name 09/03/19 1051 09/10/19 1024 12/17/19 1757     ADL UCSD   ADL Phase Entry Entry --   SOB Score total 57 57 --   Rest 1 1 --   Walk 2 2 --   Stairs 4 4 --   Bath 2 2 --   Dress 2 2 --   Shop 3 3 --     CAT Score   CAT Score 25 25 --     mMRC Score   mMRC Score -- 3 2          UCSD: Self-administered rating of dyspnea associated with activities of daily living (ADLs) 6-point scale (0 = "not at all" to 5 = "maximal or unable to do because of breathlessness")  Scoring Scores range from 0 to 120.  Minimally important difference is 5 units  CAT: CAT can  identify the health impairment of COPD patients and is better correlated with disease progression.  CAT has a scoring range of zero to 40. The CAT score is classified into four groups of low (less than 10), medium (10 - 20), high (21-30) and very high (31-40) based on the impact level of disease on health status. A CAT score over 10 suggests significant symptoms.  A worsening CAT score could be explained by an exacerbation, poor medication adherence, poor inhaler technique, or progression of COPD or comorbid conditions.  CAT MCID is 2 points  mMRC: mMRC (Modified Medical Research Council) Dyspnea Scale is used to assess the degree of baseline functional disability in patients of respiratory disease due to dyspnea. No minimal important difference is established. A decrease in score of 1 point or greater is considered a positive change.   Pulmonary Function Assessment:   Exercise Target Goals: Exercise Program Goal: Individual exercise prescription set using results from initial 6 min walk test and THRR while considering  patient's activity barriers and safety.   Exercise Prescription Goal: Initial exercise prescription builds to 30-45 minutes a day of aerobic activity, 2-3 days per week.  Home exercise guidelines will be given to patient during program as part of exercise prescription that the participant will acknowledge.  Education: Aerobic Exercise: - Group verbal and visual presentation on the components of exercise prescription. Introduces F.I.T.T principle from ACSM for exercise prescriptions.  Reviews F.I.T.T. principles of aerobic exercise including progression. Written material given at graduation.   Education: Resistance Exercise: - Group verbal and visual presentation on the components of exercise prescription. Introduces F.I.T.T principle from ACSM for exercise prescriptions  Reviews F.I.T.T. principles of resistance exercise including progression. Written material given at  graduation.    Education: Exercise & Equipment Safety: - Individual verbal instruction and demonstration of equipment use and safety with use of the equipment. Flowsheet Row Pulmonary Rehab from 09/30/2019 in [name] Cardiac and Pulmonary Rehab  Date 09/10/19  Educator Cassie [name]  Instruction Review Code 1- Verbalizes Understanding      Education: Exercise Physiology & General Exercise Guidelines: - Group verbal and written instruction with models to review the exercise physiology of the cardiovascular system and associated critical values. Provides general exercise guidelines with specific guidelines to those with heart or lung disease.    Education: Flexibility, Balance, Mind/Body Relaxation: - Group verbal and visual presentation with interactive activity on the components of exercise prescription. Introduces F.I.T.T principle from ACSM for exercise prescriptions. Reviews  F.I.T.T. principles of flexibility and balance exercise training including progression. Also discusses the mind body connection.  Reviews various relaxation techniques to help reduce and manage stress (i.e. Deep breathing, progressive muscle relaxation, and visualization). Balance handout provided to take home. Written material given at graduation.   Activity Barriers & Risk Stratification:  Activity Barriers & Cardiac Risk Stratification - 09/10/19 0915      Activity Barriers & Cardiac Risk Stratification   Activity Barriers Deconditioning;Shortness of Breath;History of Falls;Muscular Weakness           6 Minute Walk:  6 Minute Walk    Row Name 09/10/19 0913 12/17/19 1746       6 Minute Walk   Phase Initial Discharge    Distance 1163 feet 1490 feet    Distance % Change -- 28.1 %    Distance Feet Change -- 325 ft    Walk Time 6 minutes 6 minutes    # of Rest Breaks 0 0    MPH 2.2 2.82    METS 3.96 4.44    RPE 14 15    Perceived Dyspnea  2 2.5    VO2 Peak 13.86 15.55    Symptoms Yes (comment) Yes (comment)     Comments SOB, coughing coughing    Resting HR 109 bpm 118 bpm    Resting BP 118/56 110/60    Resting Oxygen Saturation  97 % 98 %    Exercise Oxygen Saturation  during 6 min walk 95 % 95 %    Max Ex. HR 125 bpm 132 bpm    Max Ex. BP 144/74 130/80    2 Minute Post BP 132/70 124/78         Interval HR   1 Minute HR 123 121    2 Minute HR 120 132    3 Minute HR 118 129    4 Minute HR 116 126    5 Minute HR 118 126    6 Minute HR 125 129    2 Minute Post HR 111 122    Interval Heart Rate? Yes Yes         Interval Oxygen   Interval Oxygen? Yes Yes    Baseline Oxygen Saturation % 97 % 98 %    1 Minute Oxygen Saturation % 96 % 95 %    1 Minute Liters of Oxygen 0 L  Room Air 0 L    2 Minute Oxygen Saturation % 95 % 96 %    2 Minute Liters of Oxygen 0 L 0 L    3 Minute Oxygen Saturation % 95 % 96 %    3 Minute Liters of Oxygen 0 L 0 L    4 Minute Oxygen Saturation % 96 % 96 %    4 Minute Liters of Oxygen 0 L 0 L    5 Minute Oxygen Saturation % 96 % 96 %    5 Minute Liters of Oxygen 0 L 0 L    6 Minute Oxygen Saturation % 96 % 96 %    6 Minute Liters of Oxygen 0 L 0 L    2 Minute Post Oxygen Saturation % 96 % 96 %    2 Minute Post Liters of Oxygen 0 L --          Oxygen Initial Assessment:  Oxygen Initial Assessment - 09/03/19 1051      Home Oxygen   Home Oxygen Device None    Sleep Oxygen Prescription None  Home Exercise Oxygen Prescription None    Home Resting Oxygen Prescription None      Initial 6 min Walk   Oxygen Used None      Program Oxygen Prescription   Program Oxygen Prescription None      Intervention   Short Term Goals To learn and understand importance of monitoring SPO2 with pulse oximeter and demonstrate accurate use of the pulse oximeter.;To learn and understand importance of maintaining oxygen saturations>88%;To learn and demonstrate proper pursed lip breathing techniques or other breathing techniques.;To learn and demonstrate proper use of  respiratory medications    Long  Term Goals Verbalizes importance of monitoring SPO2 with pulse oximeter and return demonstration;Maintenance of O2 saturations>88%;Exhibits proper breathing techniques, such as pursed lip breathing or other method taught during program session;Compliance with respiratory medication;Demonstrates proper use of MDI's           Oxygen Re-Evaluation:  Oxygen Re-Evaluation    Row Name 09/14/19 1722 10/21/19 1723 11/30/19 1748 12/16/19 1727       Program Oxygen Prescription   Program Oxygen Prescription None None None None         Home Oxygen   Home Oxygen Device None None None None    Sleep Oxygen Prescription None None None None    Home Exercise Oxygen Prescription None None None None    Home Resting Oxygen Prescription None None None None         Goals/Expected Outcomes   Short Term Goals To learn and understand importance of monitoring SPO2 with pulse oximeter and demonstrate accurate use of the pulse oximeter.;To learn and understand importance of maintaining oxygen saturations>88%;To learn and demonstrate proper pursed lip breathing techniques or other breathing techniques.;To learn and demonstrate proper use of respiratory medications To learn and understand importance of maintaining oxygen saturations>88%;To learn and understand importance of monitoring SPO2 with pulse oximeter and demonstrate accurate use of the pulse oximeter. To learn and understand importance of maintaining oxygen saturations>88%;To learn and understand importance of monitoring SPO2 with pulse oximeter and demonstrate accurate use of the pulse oximeter. To learn and understand importance of maintaining oxygen saturations>88%;To learn and understand importance of monitoring SPO2 with pulse oximeter and demonstrate accurate use of the pulse oximeter.    Long  Term Goals Verbalizes importance of monitoring SPO2 with pulse oximeter and return demonstration;Maintenance of O2  saturations>88%;Exhibits proper breathing techniques, such as pursed lip breathing or other method taught during program session;Compliance with respiratory medication;Demonstrates proper use of MDI's Verbalizes importance of monitoring SPO2 with pulse oximeter and return demonstration;Maintenance of O2 saturations>88% -- Verbalizes importance of monitoring SPO2 with pulse oximeter and return demonstration;Maintenance of O2 saturations>88%    Comments Reviewed PLB technique with [name].  Talked about how it works and it's importance in maintaining their exercise saturations. She has a pulse oximeter to check her oxygen saturation at home. Informed her why it is important to have one. Reviewed that oxygen saturations should be 88 percent and above. She checks her oxygen at home if she is having breathing issues. She knows to be 88 percent and above when she is exercising. -- She is still checking her O2 at home, sometimes she feels like it's hard to catch her breath - her O2 has been above 88.  Her doctor doubled her Gabapentin which she is not happy about. After getting her booster she feels she has had a bit of a set back.    Goals/Expected Outcomes Short: Become more profiecient at using PLB.  Long: Become independent at using PLB. Short: monitor oxygen at home with exertion. Long: maintain oxygen saturations above 88 percent independently. -- Short: monitor oxygen at home with exertion. Long: maintain oxygen saturations above 88 percent independently.           Oxygen Discharge (Final Oxygen Re-Evaluation):  Oxygen Re-Evaluation - 12/16/19 1727      Program Oxygen Prescription   Program Oxygen Prescription None      Home Oxygen   Home Oxygen Device None    Sleep Oxygen Prescription None    Home Exercise Oxygen Prescription None    Home Resting Oxygen Prescription None      Goals/Expected Outcomes   Short Term Goals To learn and understand importance of maintaining oxygen saturations>88%;To learn  and understand importance of monitoring SPO2 with pulse oximeter and demonstrate accurate use of the pulse oximeter.    Long  Term Goals Verbalizes importance of monitoring SPO2 with pulse oximeter and return demonstration;Maintenance of O2 saturations>88%    Comments She is still checking her O2 at home, sometimes she feels like it's hard to catch her breath - her O2 has been above 88.  Her doctor doubled her Gabapentin which she is not happy about. After getting her booster she feels she has had a bit of a set back.    Goals/Expected Outcomes Short: monitor oxygen at home with exertion. Long: maintain oxygen saturations above 88 percent independently.           Initial Exercise Prescription:  Initial Exercise Prescription - 09/10/19 0900      Date of Initial Exercise RX and Referring Provider   Date 09/10/19    Referring Provider Cassie Garfinkel MD      Treadmill   MPH 2.2    Grade 1    Minutes 15    METs 2.99      REL-XR   Level 1    Speed 50    Minutes 15    METs 2      T5 Nustep   Level 2    SPM 80    Minutes 15    METs 2      Prescription Details   Frequency (times per week) 3    Duration Progress to 30 minutes of continuous aerobic without signs/symptoms of physical distress      Intensity   THRR 40-80% of Max Heartrate 135-161    Ratings of Perceived Exertion 11-13    Perceived Dyspnea 0-4      Progression   Progression Continue to progress workloads to maintain intensity without signs/symptoms of physical distress.      Resistance Training   Training Prescription Yes    Weight 3 lb    Reps 10-15           Perform Capillary Blood Glucose checks as needed.  Exercise Prescription Changes:  Exercise Prescription Changes    Row Name 09/10/19 0900 09/16/19 1000 09/21/19 1700 09/29/19 0800 10/14/19 1000     Response to Exercise   Blood Pressure (Admit) 118/56 104/72 -- 122/70 126/74   Blood Pressure (Exercise) 144/74 120/70 -- 130/72 128/72   Blood  Pressure (Exit) 124/70 122/70 -- 112/68 122/82   Heart Rate (Admit) 109 bpm 107 bpm -- 103 bpm 102 bpm   Heart Rate (Exercise) 125 bpm 114 bpm -- 116 bpm 115 bpm   Heart Rate (Exit) 114 bpm 100 bpm -- 100 bpm 115 bpm   Oxygen Saturation (Admit) 97 % 97 % -- 98 %  98 %   Oxygen Saturation (Exercise) 95 % 96 % -- 97 % 96 %   Oxygen Saturation (Exit) 96 % 97 % -- 97 % 95 %   Rating of Perceived Exertion (Exercise) 14 13 -- 15 14   Perceived Dyspnea (Exercise) 2 2 -- 2 2   Symptoms SOB, coughing SOB, coughing -- -- SOB   Comments walk test results first full day of exercise -- -- --   Duration -- Progress to 30 minutes of  aerobic without signs/symptoms of physical distress Progress to 30 minutes of  aerobic without signs/symptoms of physical distress Progress to 30 minutes of  aerobic without signs/symptoms of physical distress Continue with 30 min of aerobic exercise without signs/symptoms of physical distress.   Intensity -- THRR unchanged THRR unchanged THRR unchanged THRR unchanged     Progression   Progression -- Continue to progress workloads to maintain intensity without signs/symptoms of physical distress. Continue to progress workloads to maintain intensity without signs/symptoms of physical distress. Continue to progress workloads to maintain intensity without signs/symptoms of physical distress. Continue to progress workloads to maintain intensity without signs/symptoms of physical distress.   Average METs -- 2.32 2.32 2.96 3.9     Resistance Training   Training Prescription -- Yes Yes Yes Yes   Weight -- 3 lb 3 lb 3 lb 3 lb   Reps -- 10-15 10-15 10-15 10-15     Interval Training   Interval Training -- No No No No     Treadmill   MPH -- 2.2 2.2 2.5 2.7   Grade -- 0.5 0.5 2 2.5   Minutes -- 15 15 15 15    METs -- 2.84 2.84 3.6 4     REL-XR   Level -- -- -- 2 4   Minutes -- -- -- 15 15   METs -- -- -- 2.6 3.8     T5 Nustep   Level -- 2 2 2  --   Minutes -- 15 15 15  --    METs -- 1.8 1.8 1.9 --     Home Exercise Plan   Plans to continue exercise at -- -- [facility] (comment)  Exercising 2 days/wk with Cassie [name] study on off days of rehab [facility] (comment)  Exercising 2 days/wk with Cassie [name] study on off days of rehab [facility] (comment)  Exercising 2 days/wk with Cassie [name] study on off days of rehab   Frequency -- -- Add 2 additional days to program exercise sessions. Add 2 additional days to program exercise sessions. Add 2 additional days to program exercise sessions.   Initial Home Exercises Provided -- -- 09/21/19 09/21/19 09/21/19   Row Name 10/29/19 0900 11/11/19 1500 12/09/19 1000 12/22/19 1000       Response to Exercise   Blood Pressure (Admit) 110/72 114/74 110/62 120/82    Blood Pressure (Exercise) 142/60 122/70 118/68 130/70    Blood Pressure (Exit) 108/66 118/70 106/62 122/80    Heart Rate (Admit) 104 bpm 84 bpm 90 bpm 102 bpm    Heart Rate (Exercise) 117 bpm 116 bpm 122 bpm 112 bpm    Heart Rate (Exit) 112 bpm 110 bpm 100 bpm 77 bpm    Oxygen Saturation (Admit) 98 % 98 % 98 % 98 %    Oxygen Saturation (Exercise) 97 % 95 % 96 % 96 %    Oxygen Saturation (Exit) 97 % 98 % 97 % 96 %    Rating of Perceived Exertion (Exercise) 14  13 14 14     Perceived Dyspnea (Exercise) 2 1 2.5 3    Symptoms -- SOB SOB SOB    Duration Continue with 30 min of aerobic exercise without signs/symptoms of physical distress. Continue with 30 min of aerobic exercise without signs/symptoms of physical distress. Continue with 30 min of aerobic exercise without signs/symptoms of physical distress. Continue with 30 min of aerobic exercise without signs/symptoms of physical distress.    Intensity THRR unchanged THRR unchanged THRR unchanged THRR unchanged         Progression   Progression Continue to progress workloads to maintain intensity without signs/symptoms of physical distress. Continue to progress workloads to maintain intensity without  signs/symptoms of physical distress. Continue to progress workloads to maintain intensity without signs/symptoms of physical distress. Continue to progress workloads to maintain intensity without signs/symptoms of physical distress.    Average METs 4 3.3 3.1 3.3         Resistance Training   Training Prescription Yes Yes Yes Yes    Weight 6 lb 6 lb 6 lb 6 lb    Reps 10-15 10-15 10-15 10-15         Interval Training   Interval Training No No No No         Treadmill   MPH 2.7 2.7 2.7 2.5    Grade 2 2.5 2.5 2.5    Minutes 15 15 15 15     METs 4 4 4 4          REL-XR   Level 4 4 -- 4    Minutes 15 15 -- 15    METs 4 3.2 -- 2.4         T5 Nustep   Level -- 4 4 --    Minutes -- 15 15 --    METs -- 2 2.2 --         Home Exercise Plan   Plans to continue exercise at -- [facility] (comment)  Exercising 2 days/wk with Cassie [name] study on off days of rehab [facility] (comment)  Exercising 2 days/wk with Cassie [name] study on off days of rehab [facility] (comment)  Exercising 2 days/wk with Cassie [name] study on off days of rehab    Frequency -- Add 2 additional days to program exercise sessions. Add 2 additional days to program exercise sessions. Add 2 additional days to program exercise sessions.    Initial Home Exercises Provided -- 09/21/19 09/21/19 09/21/19           Exercise Comments:   Exercise Goals and Review:  Exercise Goals    Row Name 09/10/19 0916             Exercise Goals   Increase Physical Activity Yes       Intervention Provide advice, education, support and counseling about physical activity/exercise needs.;Develop an individualized exercise prescription for aerobic and resistive training based on initial evaluation findings, risk stratification, comorbidities and participant's personal goals.       Expected Outcomes Short Term: Attend rehab on a regular basis to increase amount of physical activity.;Long Term: Add in home exercise to make  exercise part of routine and to increase amount of physical activity.;Long Term: Exercising regularly at least 3-5 days a week.       Increase Strength and Stamina Yes       Intervention Provide advice, education, support and counseling about physical activity/exercise needs.;Develop an individualized exercise prescription for aerobic and resistive training based on initial evaluation  findings, risk stratification, comorbidities and participant's personal goals.       Expected Outcomes Short Term: Increase workloads from initial exercise prescription for resistance, speed, and METs.;Short Term: Perform resistance training exercises routinely during rehab and add in resistance training at home;Long Term: Improve cardiorespiratory fitness, muscular endurance and strength as measured by increased METs and functional capacity (6MWT)       Able to understand and use rate of perceived exertion (RPE) scale Yes       Intervention Provide education and explanation on how to use RPE scale       Expected Outcomes Short Term: Able to use RPE daily in rehab to express subjective intensity level;Long Term:  Able to use RPE to guide intensity level when exercising independently       Able to understand and use Dyspnea scale Yes       Intervention Provide education and explanation on how to use Dyspnea scale       Expected Outcomes Short Term: Able to use Dyspnea scale daily in rehab to express subjective sense of shortness of breath during exertion;Long Term: Able to use Dyspnea scale to guide intensity level when exercising independently       Knowledge and understanding of Target Heart Rate Range (THRR) Yes       Intervention Provide education and explanation of THRR including how the numbers were predicted and where they are located for reference       Expected Outcomes Short Term: Able to state/look up THRR;Short Term: Able to use daily as guideline for intensity in rehab;Long Term: Able to use THRR to govern  intensity when exercising independently       Able to check pulse independently Yes       Intervention Provide education and demonstration on how to check pulse in carotid and radial arteries.;Review the importance of being able to check your own pulse for safety during independent exercise       Expected Outcomes Short Term: Able to explain why pulse checking is important during independent exercise;Long Term: Able to check pulse independently and accurately       Understanding of Exercise Prescription Yes       Intervention Provide education, explanation, and written materials on patient's individual exercise prescription       Expected Outcomes Short Term: Able to explain program exercise prescription;Long Term: Able to explain home exercise prescription to exercise independently              Exercise Goals Re-Evaluation :  Exercise Goals Re-Evaluation    Row Name 09/14/19 1720 09/21/19 1737 09/29/19 [id number] 10/14/19 1011 10/21/19 1734     Exercise Goal Re-Evaluation   Exercise Goals Review Increase Physical Activity;Able to understand and use rate of perceived exertion (RPE) scale;Knowledge and understanding of Target Heart Rate Range (THRR);Understanding of Exercise Prescription;Increase Strength and Stamina;Able to understand and use Dyspnea scale;Able to check pulse independently Increase Physical Activity;Able to understand and use rate of perceived exertion (RPE) scale;Knowledge and understanding of Target Heart Rate Range (THRR);Understanding of Exercise Prescription;Increase Strength and Stamina;Able to understand and use Dyspnea scale;Able to check pulse independently Increase Physical Activity;Able to understand and use rate of perceived exertion (RPE) scale;Knowledge and understanding of Target Heart Rate Range (THRR);Understanding of Exercise Prescription;Increase Strength and Stamina;Able to understand and use Dyspnea scale;Able to check pulse independently Increase Physical  Activity;Increase Strength and Stamina;Understanding of Exercise Prescription Increase Physical Activity;Increase Strength and Stamina   Comments Reviewed RPE and dyspnea scales, THR and  program prescription with [name] today.  [name] voiced understanding and was given a copy of goals to take home. Reviewed home exercise with [name] today.  [name] plans to exercise 2 days a week with Cassie [name] study of off days of rehab for exercise.  Reviewed THR, pulse, RPE, sign and symptoms, pulse oximetery and when to call 911 or MD.  Also discussed weather considerations and indoor options.  [name] voiced understanding. Mairead is doing well in rehab. She has already increased her treadmill speed to 2.5 and incline to 2%. She is adjusting well to the exercise machines. O2 sats are maintained well. Will continue to monitor progress. Cassie [name] has continued to do well in rehab.  She now on level 4 for the XR and up to 2.7 mph on the treadmill.  We will continue to monitor her progress. Cassie [name] is going to start another [name] program study for COVID 19. Her classes start in November so she will go there on her off days from Cassie [name].   Expected Outcomes Short: Use RPE daily to regulate intensity. Long: Follow program prescription in THR. Short: Continue to attend pulmonary rehab with consistent attendence and Cassie [name] study on off days of rehab. Long: Improve SOB and be able to maintain an idependent exercise program following Pulmonary Rehab. Short: Continue to increase workloads Long: Progress overall MET level/ increase strength and stamina Short: Try 4 lb hand weights  Long: Continue to improve stamina Short: Start [name] at Cassie [name]. Long: Maintain exercise at Cassie [name] and [name] at rehab.   Cassie [name] Name 10/29/19 7829 11/11/19 1525 11/30/19 1733 12/09/19 1007 12/16/19 1722     Exercise Goal Re-Evaluation   Exercise Goals Review Increase Physical Activity;Increase Strength and Stamina Increase Physical Activity;Increase Strength and Stamina;Understanding of Exercise  Prescription Increase Physical Activity;Increase Strength and Stamina;Understanding of Exercise Prescription Increase Physical Activity;Increase Strength and Stamina;Understanding of Exercise Prescription Increase Physical Activity;Increase Strength and Stamina;Understanding of Exercise Prescription   Comments Susen is progressing well and averaging 4 METS and oxygen levels stay in high 90s.  She uses 6 lb weights for strength work.  Staff will monitor progress. Cassie [name] continues to do well in rehab.  She is up to level 4 on the T5 NuStep and 2.5% grade on the treadmill.  We will continue to monitor his progress. Today is Cassie [name]'s first day back after missing 2 weeks.  She is going slower than ususal on T5 and wil adjust TM as needed. Cassie [name] returned on 11/29 and has been doing well.  She is getting back slowly.  We will continue to monitor her progress. She is still doing [name] 2x/week - on break right now. After rehab she will be going to Cassie [name].   Expected Outcomes Short:  continue to exercise consistently Long:  build stamina and increase MET level Short: Continue to maintain 2.5% grade on treadmill  Long; Continue to improve stamina. Short: get back to conssistent exercise Long: get back to previous level Short: Continue to attend consistently again Long; Continue to improe stamina. Short: Continue to attend consistently, continue with [name] Long: Continue to improve stamina.   [place] Name 12/22/19 1003             Exercise Goal Re-Evaluation   Exercise Goals Review Increase Physical Activity;Increase Strength and Stamina       Comments Cassie [name] improved her post 6MWT by 28%!  She will complete HT soon and plans to exercise at [facility] facility.       Expected Outcomes Short: complete  LW program Long:  maintain exercise on her own              Discharge Exercise Prescription (Final Exercise Prescription Changes):  Exercise Prescription Changes - 12/22/19 1000      Response to Exercise   Blood  Pressure (Admit) 120/82    Blood Pressure (Exercise) 130/70    Blood Pressure (Exit) 122/80    Heart Rate (Admit) 102 bpm    Heart Rate (Exercise) 112 bpm    Heart Rate (Exit) 77 bpm    Oxygen Saturation (Admit) 98 %    Oxygen Saturation (Exercise) 96 %    Oxygen Saturation (Exit) 96 %    Rating of Perceived Exertion (Exercise) 14    Perceived Dyspnea (Exercise) 3    Symptoms SOB    Duration Continue with 30 min of aerobic exercise without signs/symptoms of physical distress.    Intensity THRR unchanged      Progression   Progression Continue to progress workloads to maintain intensity without signs/symptoms of physical distress.    Average METs 3.3      Resistance Training   Training Prescription Yes    Weight 6 lb    Reps 10-15      Interval Training   Interval Training No      Treadmill   MPH 2.5    Grade 2.5    Minutes 15    METs 4      REL-XR   Level 4    Minutes 15    METs 2.4      Home Exercise Plan   Plans to continue exercise at [employer] (comment)   Exercising 2 days/wk with Cassie [name] study on off days of rehab   Frequency Add 2 additional days to program exercise sessions.    Initial Home Exercises Provided 09/21/19           Nutrition:  Target Goals: Understanding of nutrition guidelines, daily intake of sodium <1520m, cholesterol <2052m calories 30% from fat and 7% or less from saturated fats, daily to have 5 or more servings of fruits and vegetables.  Education: All About Nutrition: -Group instruction provided by verbal, written material, interactive activities, discussions, models, and posters to present general guidelines for heart healthy nutrition including fat, fiber, MyPlate, the role of sodium in heart healthy nutrition, utilization of the nutrition label, and utilization of this knowledge for meal planning. Follow up email sent as well. Written material given at graduation.   Biometrics:  Pre Biometrics - 09/10/19 0918      Pre  Biometrics   Height 5' 5.6" (1.666 m)    Weight 206 lb 4.8 oz (93.6 kg)    BMI (Calculated) 33.71    Single Leg Stand 14 seconds           Post Biometrics - 12/17/19 1757       Post  Biometrics   Single Leg Stand 22.03 seconds           Nutrition Therapy Plan and Nutrition Goals:  Nutrition Therapy & Goals - 09/22/19 1210      Nutrition Therapy   Diet heart healthy, low Na, Pulmonary MNT    Protein (specify units) 75g    Fiber 25 grams    Whole Grain Foods 3 servings    Saturated Fats 12 max. grams    Fruits and Vegetables 5 servings/day    Sodium 1.5 grams      Personal Nutrition Goals   Nutrition Goal ST: Power bowls  and buddhas LT: improve ADL, improving fitness, improve energy (changes on time of day, highs of 5/10)    Comments B: cereal or herberlife shake L: sandwich and fruit  - white bread S: granola bar or goldfish D: hello fresh meal, breakfast for dinner, or takeout. Feels SOB when eating and for a while after. Drinks: herbalife tea and water - drinks all day. Discussed heart healthy eating and meal planning. discussed pulmonary MNT.      Intervention Plan   Intervention Prescribe, educate and counsel regarding individualized specific dietary modifications aiming towards targeted core components such as weight, hypertension, lipid management, diabetes, heart failure and other comorbidities.;Nutrition handout(s) given to patient.    Expected Outcomes Short Term Goal: Understand basic principles of dietary content, such as calories, fat, sodium, cholesterol and nutrients.;Short Term Goal: A plan has been developed with personal nutrition goals set during dietitian appointment.;Long Term Goal: Adherence to prescribed nutrition plan.           Nutrition Assessments:  Nutrition Assessments - 09/03/19 1050      MEDFICTS Scores   Pre Score 21          MEDIFICTS Score Key:  ?70 Need to make dietary changes   40-70 Heart Healthy Diet  ? 40 Therapeutic Level  Cholesterol Diet   Picture Your Plate Scores:  <26 Unhealthy dietary pattern with much room for improvement.  41-50 Dietary pattern unlikely to meet recommendations for good health and room for improvement.  51-60 More healthful dietary pattern, with some room for improvement.   >60 Healthy dietary pattern, although there may be some specific behaviors that could be improved.   Nutrition Goals Re-Evaluation:  Nutrition Goals Re-Evaluation    Row Name 10/28/19 1728 11/30/19 1733 12/16/19 1738         Goals   Nutrition Goal ST: Power bowls and buddha bowls for lunch, try whole wheat bread LT: Whole wheat bread 1/2 the time, eat 8 servings of fruits and vegetables, meet protein and calorie needs -- ST: drink water with exercise LT: continue with heart healthy eating     Comment She reports trying to eat healthy at lunch - Ex: leftover chicken, sandwich with cucumbers (white wheat). She also is drining more water. She reports no other changes at this time. Ambria is doing Sales executive for dinners.  She is still working on incorporating more fruits and vegetables.  She is drinking more water. Ting is still doing Sales executive for dinners- she reports it takes the decision making out of dinner. breakfast: oatmeal or shake. Lunch: variety - soup. She reports getting brownies as a present.  She reports not feeling like she is not drinking enough water and would like to work on that.     Expected Outcome ST: Power bowls and buddha bowls for lunch, try whole wheat bread LT: Whole wheat bread 1/2 the time, eat 8 servings of fruits and vegetables, meet protein and calorie needs Short: continue to work on getting more fruits and veggies Long:  get full 8 servings of fruits and veggies ST: drink water with exercise LT: continue with heart healthy eating            Nutrition Goals Discharge (Final Nutrition Goals Re-Evaluation):  Nutrition Goals Re-Evaluation - 12/16/19 1738      Goals   Nutrition Goal  ST: drink water with exercise LT: continue with heart healthy eating    Comment Javier is still doing Sales executive for dinners- she reports it  takes the decision making out of dinner. breakfast: oatmeal or shake. Lunch: variety - soup. She reports getting brownies as a present.  She reports not feeling like she is not drinking enough water and would like to work on that.    Expected Outcome ST: drink water with exercise LT: continue with heart healthy eating           Psychosocial: Target Goals: Acknowledge presence or absence of significant depression and/or stress, maximize coping skills, provide positive support system. Participant is able to verbalize types and ability to use techniques and skills needed for reducing stress and depression.   Education: Stress, Anxiety, and Depression - Group verbal and visual presentation to define topics covered.  Reviews how body is impacted by stress, anxiety, and depression.  Also discusses healthy ways to reduce stress and to treat/manage anxiety and depression.  Written material given at graduation.   Education: Sleep Hygiene -Provides group verbal and written instruction about how sleep can affect your health.  Define sleep hygiene, discuss sleep cycles and impact of sleep habits. Review good sleep hygiene tips.    Initial Review & Psychosocial Screening:  Initial Psych Review & Screening - 09/03/19 1043      Initial Review   Current issues with Current Anxiety/Panic;Current Stress Concerns;Current Sleep Concerns    Source of Stress Concerns Chronic Illness;Family;Unable to participate in former interests or hobbies;Unable to perform yard/household activities    Comments Frustration form not being able to go and do from dealing with COVID recovery over last year, meds help with sleep and cough, mother just diagnosised with breast cancer this month      Cassie [name]? Yes   husband, mother and father, kids (daughter just  moved to [place])     Barriers   Psychosocial barriers to participate in program There are no identifiable barriers or psychosocial needs.;The patient should benefit from training in stress management and relaxation.;Psychosocial barriers identified (see note)      Screening Interventions   Interventions Encouraged to exercise;Provide feedback about the scores to participant;To provide support and resources with identified psychosocial needs    Expected Outcomes Short Term goal: Utilizing psychosocial counselor, staff and physician to assist with identification of specific Stressors or current issues interfering with healing process. Setting desired goal for each stressor or current issue identified.;Long Term Goal: Stressors or current issues are controlled or eliminated.;Short Term goal: Identification and review with participant of any Quality of Life or Depression concerns found by scoring the questionnaire.;Long Term goal: The participant improves quality of Life and PHQ9 Scores as seen by post scores and/or verbalization of changes           Quality of Life Scores:  Scores of 19 and below usually indicate a poorer quality of life in these areas.  A difference of  2-3 points is a clinically meaningful difference.  A difference of 2-3 points in the total score of the Quality of Life Index has been associated with significant improvement in overall quality of life, self-image, physical symptoms, and general health in studies assessing change in quality of life.  PHQ-9: Recent Review Flowsheet Data    Depression screen [facility] 2/9 09/10/2019 01/05/2019 12/20/2017   Decreased Interest 1 0 2   Down, Depressed, Hopeless 0 0 1   PHQ - 2 Score 1 0 3   Altered sleeping 1 - 2   Tired, decreased energy 2 - 2   Change in appetite 0 - 0  Feeling bad or failure about yourself  0 - 0   Trouble concentrating 0 - 0   Moving slowly or fidgety/restless 0 - 0   Suicidal thoughts 0 - 0   PHQ-9 Score 4 - 7    Difficult doing work/chores Somewhat difficult - -     Interpretation of Total Score  Total Score Depression Severity:  1-4 = Minimal depression, 5-9 = Mild depression, 10-14 = Moderate depression, 15-19 = Moderately severe depression, 20-27 = Severe depression   Psychosocial Evaluation and Intervention:  Psychosocial Evaluation - 09/03/19 1052      Psychosocial Evaluation & Interventions   Interventions Stress management education;Encouraged to exercise with the program and follow exercise prescription    Comments 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  She was diganoised a year ago and is still recovering.  She still is having extreme SOB and hoarseness, and extreme fatigue.  She wants to be able to go into work and get out of the house to be able to do more and breathe better.  She is originally from [place] and went to [place] for school. Her husband works for school and her family is now in [place], [place].  She continues to struggle with some brain fog but it is starting to clear.  This has all really been frustrating for her as she is used to being able to just go and do.  She is hoping to be able to come to class after her work day so that she can continue to work as much as possible.  Her family has also just found out that her mom has breast cancer.  She is looking forward to feeling better.    Expected Outcomes Short: Attend rehab to improve stamina and SOB  Long: Continue to stay positive and    Continue Psychosocial Services  Follow up required by staff           Psychosocial Re-Evaluation:  Psychosocial Re-Evaluation    Cassie [name] Name 10/21/19 1729 12/16/19 1733           Psychosocial Re-Evaluation   Current issues with Current Stress Concerns Current Stress Concerns      Comments She is stressed that she is not back to normal since she has had COVID. Cassie [name] is able to keep coming to rehab and it is improving her mood. Her job has been patient with her and  she is working full time at home. She reports stress and frustration with her progress. SHe is also stressed about her job and how long they will be flexible. She crafts to relieve stress. She reports her family is a good support system for her - especially her husband, daughter, and parents. Her parents kept her active when she was first recovering and her husband was at work.      Expected Outcomes Short: Continue to exercise regularly to support mental health and notify staff of any changes. Long: maintain mental health and well being through teaching of rehab or prescribed medications independently. Short: Continue to exercise regularly and craft to support mental health  Long: maintain mental health by using support system, continue exercise, and crafting      Interventions Encouraged to attend Pulmonary Rehabilitation for the exercise Encouraged to attend Pulmonary Rehabilitation for the exercise      Continue Psychosocial Services  Follow up required by staff --      Comments -- Frustration form not being able to go and do from  dealing with COVID recovery over last year is her biggest stressor.             Initial Review   Source of Stress Concerns -- Chronic Illness;Family;Unable to participate in former interests or hobbies;Unable to perform yard/household activities             Psychosocial Discharge (Final Psychosocial Re-Evaluation):  Psychosocial Re-Evaluation - 12/16/19 1733      Psychosocial Re-Evaluation   Current issues with Current Stress Concerns    Comments She reports stress and frustration with her progress. SHe is also stressed about her job and how long they will be flexible. She crafts to relieve stress. She reports her family is a good support system for her - especially her husband, daughter, and parents. Her parents kept her active when she was first recovering and her husband was at work.    Expected Outcomes Short: Continue to exercise regularly and craft to support  mental health  Long: maintain mental health by using support system, continue exercise, and crafting    Interventions Encouraged to attend Pulmonary Rehabilitation for the exercise    Comments Frustration form not being able to go and do from dealing with COVID recovery over last year is her biggest stressor.      Initial Review   Source of Stress Concerns Chronic Illness;Family;Unable to participate in former interests or hobbies;Unable to perform yard/household activities           Education: Education Goals: Education classes will be provided on a weekly basis, covering required topics. Participant will state understanding/return demonstration of topics presented.  Learning Barriers/Preferences:  Learning Barriers/Preferences - 09/03/19 1042      Learning Barriers/Preferences   Learning Barriers Sight   glasses to drive   Learning Preferences Skilled Demonstration           General Pulmonary Education Topics:  Infection Prevention: - Provides verbal and written material to individual with discussion of infection control including proper hand washing and proper equipment cleaning during exercise session. Flowsheet Row Pulmonary Rehab from 09/30/2019 in [facility]  Date 09/10/19  Educator Cassie [name]  Instruction Review Code 1- Verbalizes Understanding      Falls Prevention: - Provides verbal and written material to individual with discussion of falls prevention and safety. Flowsheet Row Pulmonary Rehab from 09/30/2019 in [facility] Card[facility] and Pulmonary Rehab  Date 09/10/19  Educator [facility]  Instruction Review Code 1- Verbalizes Understanding      Chronic Lung Disease Review: - Group verbal instruction with posters, models, PowerPoint presentations and videos,  to review new updates, new respiratory medications, new advancements in procedures and treatments. Providing information on websites and "800" numbers for continued self-education. Includes information about  supplement oxygen, available portable oxygen systems, continuous and intermittent flow rates, oxygen safety, concentrators, and Medicare reimbursement for oxygen. Explanation of Pulmonary Drugs, including class, frequency, complications, importance of spacers, rinsing mouth after steroid MDI's, and proper cleaning methods for nebulizers. Review of basic lung anatomy and physiology related to function, structure, and complications of lung disease. Review of risk factors. Discussion about methods for diagnosing sleep apnea and types of masks and machines for OSA. Includes a review of the use of types of environmental controls: home humidity, furnaces, filters, dust mite/pet prevention, HEPA vacuums. Discussion about weather changes, air quality and the benefits of nasal washing. Instruction on Warning signs, infection symptoms, calling MD promptly, preventive modes, and value of vaccinations. Review of effective airway clearance, coughing and/or vibration techniques. Emphasizing  that all should Create an Action Plan. Written material given at graduation. Flowsheet Row Pulmonary Rehab from 09/30/2019 in [facility] Cardiac and Pulmonary Rehab  Date 09/10/19  Instruction Review Code 3- Needs Reinforcement  [need identified]      AED/CPR: - Group verbal and written instruction with the use of models to demonstrate the basic use of the AED with the basic ABC's of resuscitation.    Anatomy and Cardiac Procedures: - Group verbal and visual presentation and models provide information about basic cardiac anatomy and function. Reviews the testing methods done to diagnose heart disease and the outcomes of the test results. Describes the treatment choices: Medical Management, Angioplasty, or Coronary Bypass Surgery for treating various heart conditions including Myocardial Infarction, Angina, Valve Disease, and Cardiac Arrhythmias.  Written material given at graduation.   Medication Safety: - Group verbal and visual  instruction to review commonly prescribed medications for heart and lung disease. Reviews the medication, class of the drug, and side effects. Includes the steps to properly store meds and maintain the prescription regimen.  Written material given at graduation. Flowsheet Row Pulmonary Rehab from 09/30/2019 in [name]  Date 09/16/19  Educator sb  Instruction Review Code 1- Verbalizes Understanding      Other: -Provides group and verbal instruction on various topics (see comments)   Knowledge Questionnaire Score:    Core Components/Risk Factors/Patient Goals at Admission:  Personal Goals and Risk Factors at Admission - 09/10/19 0918      Core Components/Risk Factors/Patient Goals on Admission    Weight Management Yes;Obesity;Weight Loss    Intervention Weight Management: Develop a combined nutrition and exercise program designed to reach desired caloric intake, while maintaining appropriate intake of nutrient and fiber, sodium and fats, and appropriate energy expenditure required for the weight goal.;Weight Management: Provide education and appropriate resources to help participant work on and attain dietary goals.;Weight Management/Obesity: Establish reasonable short term and long term weight goals.;Obesity: Provide education and appropriate resources to help participant work on and attain dietary goals.    Admit Weight 206 lb 4.8 oz (93.6 kg)    Goal Weight: Short Term 200 lb (90.7 kg)    Goal Weight: Long Term 195 lb (88.5 kg)    Expected Outcomes Short Term: Continue to assess and modify interventions until short term weight is achieved;Long Term: Adherence to nutrition and physical activity/exercise program aimed toward attainment of established weight goal;Weight Loss: Understanding of general recommendations for a balanced deficit meal plan, which promotes 1-2 lb weight loss per week and includes a negative energy balance of [phone] kcal/d;Understanding  recommendations for meals to include 15-35% energy as protein, 25-35% energy from fat, 35-60% energy from carbohydrates, less than 275m of dietary cholesterol, 20-35 gm of total fiber daily;Understanding of distribution of calorie intake throughout the day with the consumption of 4-5 meals/snacks    Improve shortness of breath with ADL's Yes    Intervention Provide education, individualized exercise plan and daily activity instruction to help decrease symptoms of SOB with activities of daily living.    Expected Outcomes Short Term: Improve cardiorespiratory fitness to achieve a reduction of symptoms when performing ADLs;Long Term: Be able to perform more ADLs without symptoms or delay the onset of symptoms           Education:Diabetes - Individual verbal and written instruction to review signs/symptoms of diabetes, desired ranges of glucose level fasting, after meals and with exercise. Acknowledge that pre and post exercise glucose checks will be done  for 3 sessions at entry of program.   Know Your Numbers and Heart Failure: - Group verbal and visual instruction to discuss disease risk factors for cardiac and pulmonary disease and treatment options.  Reviews associated critical values for Overweight/Obesity, Hypertension, Cholesterol, and Diabetes.  Discusses basics of heart failure: signs/symptoms and treatments.  Introduces Heart Failure Zone chart for action plan for heart failure.  Written material given at graduation.   Core Components/Risk Factors/Patient Goals Review:   Goals and Risk Factor Review    Row Name 10/21/19 1726 11/30/19 1730 12/16/19 1743         Core Components/Risk Factors/Patient Goals Review   Personal Goals Review Improve shortness of breath with ADL's Improve shortness of breath with ADL's Improve shortness of breath with ADL's     Review She is able to do some chores without being short of breath. When she vaccuums is when she gets a little short of breath.Spoke  to patient about their shortness of breath and what they can do to improve. Patient has been informed of breathing techniques when starting the program. Patient is informed to tell staff if they have had any med changes and that certain meds they are taking or not taking can be causing shortness of breath. Illiana was feeling better before getting the Covid vaccine.  She feels she's had a major setback.  She is back on albuterol and just finished prednisone. Schwanda was feeling better before getting the Covid vaccine, however, she feels she's had a major setback since your first vaccine, but feels she is improving.  She continues to exercise and practice PLB.     Expected Outcomes Short: Attend [facility] regularly to improve shortness of breath with ADL's. Long: maintain independence with ADL's Short: get back to regular exercise Long: work up to previous levels Short: get back to regular exercise once [name] break is over Long: work up to previous levels            Core Components/Risk Factors/Patient Goals at Discharge (Final Review):   Goals and Risk Factor Review - 12/16/19 1743      Core Components/Risk Factors/Patient Goals Review   Personal Goals Review Improve shortness of breath with ADL's    Review Yamira was feeling better before getting the Covid vaccine, however, she feels she's had a major setback since your first vaccine, but feels she is improving.  She continues to exercise and practice PLB.    Expected Outcomes Short: get back to regular exercise once [name] break is over Long: work up to previous levels           ITP Comments:  ITP Comments    Row Name 09/03/19 1059 09/10/19 0913 09/14/19 1718 09/22/19 1232 10/07/19 0555   ITP Comments Completed virtual orientation today.  EP evaluation is scheduled for Thurs 9/9 at 730am.  Documentation for diagnosis can be found in [facility] encounter 08/21/19. Completed 6MWT and gym orientation. Initial ITP created and sent for review to Dr. Emily Filbert,  Medical Director. First full day of exercise!  Patient was oriented to gym and equipment including functions, settings, policies, and procedures.  Patient's individual exercise prescription and treatment plan were reviewed.  All starting workloads were established based on the results of the 6 minute walk test done at initial orientation visit.  The plan for exercise progression was also introduced and progression will be customized based on patient's performance and goals. Completed Initial RD Evaluation 30 Day review completed. Medical Director ITP review done,  changes made as directed, and signed approval by Medical Director.   [place] Name 11/04/19 0653 12/02/19 1017 12/30/19 0603       ITP Comments 30 Day review completed. Medical Director ITP review done, changes made as directed, and signed approval by Medical Director. 30 Day review completed. Medical Director ITP review done, changes made as directed, and signed approval by Medical Director. 30 Day review completed. Medical Director ITP review done, changes made as directed, and signed approval by Medical Director.            Comments:

## 2020-01-05 ENCOUNTER — Encounter: Payer: Self-pay | Admitting: *Deleted

## 2020-01-05 DIAGNOSIS — R0609 Other forms of dyspnea: Secondary | ICD-10-CM

## 2020-01-05 DIAGNOSIS — R0600 Dyspnea, unspecified: Secondary | ICD-10-CM

## 2020-01-06 ENCOUNTER — Encounter: Payer: BC Managed Care – PPO | Attending: Pulmonary Disease

## 2020-01-06 ENCOUNTER — Other Ambulatory Visit: Payer: Self-pay

## 2020-01-06 DIAGNOSIS — R0600 Dyspnea, unspecified: Secondary | ICD-10-CM | POA: Diagnosis not present

## 2020-01-06 DIAGNOSIS — R0609 Other forms of dyspnea: Secondary | ICD-10-CM

## 2020-01-06 NOTE — Progress Notes (Signed)
Daily Session Note  Patient Details  Name: Cassie [name] MRN: [id number] Date of Birth: 07/07/1970 Referring Provider:   Flowsheet Row Pulmonary Rehab from 09/10/2019 in [facility] Cardiac and Pulmonary Rehab  Referring Provider Mannam, Praveen MD      Encounter Date: 01/06/2020  Check In:  Session Check In - 01/06/20 1712      Check-In   Supervising physician immediately available to respond to emergencies See telemetry face sheet for immediately available ER MD    Location [facility]-Cardiac & Pulmonary Rehab    Staff Present Kelly Bollinger, MPA, RN;Joseph Hood RCP,RRT,BSRT;Kara Langdon, MS Exercise Physiologist    Virtual Visit No    Medication changes reported     No    Fall or balance concerns reported    No    Warm-up and Cool-down Performed on first and last piece of equipment    Resistance Training Performed Yes    VAD Patient? No    PAD/SET Patient? No      Pain Assessment   Currently in Pain? No/denies              Social History   Tobacco Use  Smoking Status Former Smoker  . Packs/day: 0.10  . Years: 5.00  . Pack years: 0.50  . Types: Cigarettes  . Quit date: 02/21/2007  . Years since quitting: 12.8  Smokeless Tobacco Never Used  Tobacco Comment   social smoker    Goals Met:  Independence with exercise equipment Exercise tolerated well No report of cardiac concerns or symptoms Strength training completed today  Goals Unmet:  Not Applicable  Comments: Pt able to follow exercise prescription today without complaint.  Will continue to monitor for progression.    Dr. Mark Miller is Medical Director for [facility] and [facility]. 

## 2020-01-07 ENCOUNTER — Encounter: Payer: BC Managed Care – PPO | Admitting: *Deleted

## 2020-01-07 DIAGNOSIS — R0600 Dyspnea, unspecified: Secondary | ICD-10-CM

## 2020-01-07 DIAGNOSIS — R0609 Other forms of dyspnea: Secondary | ICD-10-CM

## 2020-01-07 NOTE — Progress Notes (Signed)
Daily Session Note  Patient Details  Name: Cassie [name] MRN: [id number] Date of Birth: [birth year] Referring Provider:   Flowsheet Row Pulmonary Rehab from 09/10/2019 in [facility] Card[facility] and Pulmonary Rehab  Referring Provider Marshell Garfinkel MD      Encounter Date: 01/07/2020  Check In:  Session Check In - 01/07/20 1715      Check-In   Supervising physician immediately available to respond to emergencies See telemetry face sheet for immediately available ER MD    Location [facility] & Pul[facility] Rehab    Staff Present Heath Lark, RN, BSN, Jacklynn Bue, MS Exercise Physiologist;Joseph Tessie Fass RCP,RRT,BSRT    Virtual Visit No    Medication changes reported     No    Fall or balance concerns reported    No    Warm-up and Cool-down Performed on first and last piece of equipment    Resistance Training Performed Yes    VAD Patient? No    PAD/SET Patient? No      Pain Assessment   Currently in Pain? No/denies              Social History   Tobacco Use  Smoking Status Former Smoker  . Packs/day: 0.10  . Years: 5.00  . Pack years: 0.50  . Types: Cigarettes  . Quit date: 02/21/2007  . Years since quitting: 12.8  Smokeless Tobacco Never Used  Tobacco Comment   social smoker    Goals Met:  Proper associated with RPD/PD & O2 Sat Independence with exercise equipment Exercise tolerated well No report of cardiac concerns or symptoms  Goals Unmet:  Not Applicable  Comments: Pt able to follow exercise prescription today without complaint.  Will continue to monitor for progression.    Dr. Emily Filbert is Medical Director for [place] and [facility].

## 2020-01-11 ENCOUNTER — Other Ambulatory Visit: Payer: Self-pay

## 2020-01-11 DIAGNOSIS — R0600 Dyspnea, unspecified: Secondary | ICD-10-CM | POA: Diagnosis not present

## 2020-01-11 DIAGNOSIS — R0609 Other forms of dyspnea: Secondary | ICD-10-CM

## 2020-01-11 NOTE — Progress Notes (Signed)
Daily Session Note  Patient Details  Name: Cassie [name] MRN: [id number] Date of Birth: [birth year] Referring Provider:   Flowsheet Row Pulmonary Rehab from 09/10/2019 in [facility] Cardiac and Pulmonary Rehab  Referring Provider Marshell Garfinkel MD      Encounter Date: 01/11/2020  Check In:  Session Check In - 01/11/20 1719      Check-In   Supervising physician immediately available to respond to emergencies See telemetry face sheet for immediately available ER MD    Location [facility] & Pul[facility] Rehab    Staff Present Birdie Sons, MPA, Mauricia Area, BS, ACSM CEP, Exercise Physiologist;Kara Eliezer Bottom, MS Exercise Physiologist    Virtual Visit No    Medication changes reported     No    Fall or balance concerns reported    No    Warm-up and Cool-down Performed on first and last piece of equipment    Resistance Training Performed Yes    VAD Patient? No    PAD/SET Patient? No      Pain Assessment   Currently in Pain? No/denies              Social History   Tobacco Use  Smoking Status Former Smoker  . Packs/day: 0.10  . Years: 5.00  . Pack years: 0.50  . Types: Cigarettes  . Quit date: 02/21/2007  . Years since quitting: 12.8  Smokeless Tobacco Never Used  Tobacco Comment   social smoker    Goals Met:  Independence with exercise equipment Exercise tolerated well No report of cardiac concerns or symptoms Strength training completed today  Goals Unmet:  Not Applicable  Comments: Pt able to follow exercise prescription today without complaint.  Will continue to monitor for progression.    Dr. Emily Filbert is Medical Director for [place] and [facility].

## 2020-01-13 ENCOUNTER — Other Ambulatory Visit: Payer: Self-pay

## 2020-01-13 DIAGNOSIS — R0600 Dyspnea, unspecified: Secondary | ICD-10-CM | POA: Diagnosis not present

## 2020-01-13 DIAGNOSIS — R0609 Other forms of dyspnea: Secondary | ICD-10-CM

## 2020-01-13 NOTE — Progress Notes (Signed)
Daily Session Note  Patient Details  Name: Cassie [name] MRN: [id number] Date of Birth: [birth year] Referring Provider:   Flowsheet Row Pulmonary Rehab from 09/10/2019 in [facility] Cardiac and Pulmonary Rehab  Referring Provider Marshell Garfinkel MD      Encounter Date: 01/13/2020  Check In:  Session Check In - 01/13/20 1714      Check-In   Supervising physician immediately available to respond to emergencies See telemetry face sheet for immediately available ER MD    Location [facility]-Cardiac & Pulmonary Rehab    Staff Present Birdie Sons, MPA, Elveria Rising, BA, ACSM CEP, Exercise Physiologist;Joseph Lou Miner, [place] Exercise Physiologist    Virtual Visit No    Medication changes reported     No    Fall or balance concerns reported    No    Warm-up and Cool-down Performed on first and last piece of equipment    Resistance Training Performed Yes    VAD Patient? No    PAD/SET Patient? No      Pain Assessment   Currently in Pain? No/denies              Social History   Tobacco Use  Smoking Status Former Smoker  . Packs/day: 0.10  . Years: 5.00  . Pack years: 0.50  . Types: Cigarettes  . Quit date: 02/21/2007  . Years since quitting: 12.9  Smokeless Tobacco Never Used  Tobacco Comment   social smoker    Goals Met:  Independence with exercise equipment Exercise tolerated well No report of cardiac concerns or symptoms Strength training completed today  Goals Unmet:  Not Applicable  Comments: Pt able to follow exercise prescription today without complaint.  Will continue to monitor for progression.    Dr. Emily Filbert is Medical Director for [place] and [facility].

## 2020-01-14 ENCOUNTER — Encounter: Payer: BC Managed Care – PPO | Admitting: *Deleted

## 2020-01-14 ENCOUNTER — Other Ambulatory Visit: Payer: Self-pay | Admitting: Family Medicine

## 2020-01-14 DIAGNOSIS — R0600 Dyspnea, unspecified: Secondary | ICD-10-CM | POA: Diagnosis not present

## 2020-01-14 DIAGNOSIS — R0609 Other forms of dyspnea: Secondary | ICD-10-CM

## 2020-01-14 DIAGNOSIS — R058 Other specified cough: Secondary | ICD-10-CM

## 2020-01-14 NOTE — Progress Notes (Signed)
Daily Session Note  Patient Details  Name: Cassie [name] MRN: [id number] Date of Birth: 1970-05-24 Referring Provider:   Flowsheet Row Pulmonary Rehab from 09/10/2019 in [facility] Cardiac and Pulmonary Rehab  Referring Provider Marshell Garfinkel MD      Encounter Date: 01/14/2020  Check In:  Session Check In - 01/14/20 1727      Check-In   Supervising physician immediately available to respond to emergencies See telemetry face sheet for immediately available ER MD    Location [facility]-Cardiac & Pulmonary Rehab    Staff Present Heath Lark, RN, BSN, CCRP;Joseph Lou Miner, [place] Exercise Physiologist    Virtual Visit No    Medication changes reported     No    Fall or balance concerns reported    No    Warm-up and Cool-down Performed on first and last piece of equipment    Resistance Training Performed Yes    VAD Patient? No    PAD/SET Patient? No      Pain Assessment   Currently in Pain? No/denies              Social History   Tobacco Use  Smoking Status Former Smoker  . Packs/day: 0.10  . Years: 5.00  . Pack years: 0.50  . Types: Cigarettes  . Quit date: 02/21/2007  . Years since quitting: 12.9  Smokeless Tobacco Never Used  Tobacco Comment   social smoker    Goals Met:  Proper associated with RPD/PD & O2 Sat Independence with exercise equipment Exercise tolerated well No report of cardiac concerns or symptoms  Goals Unmet:  Not Applicable  Comments:  Cassie [name] graduated today from  rehab with 36 sessions completed.  Details of the patient's exercise prescription and what She needs to do in order to continue the prescription and progress were discussed with patient.  Patient was given a copy of prescription and goals.  Patient verbalized understanding.  Cassie [name] plans to continue to exercise by continuing to exercise with the PT study at [facility].    Dr. Emily Filbert is Medical Director for [place] and [facility].

## 2020-01-14 NOTE — Patient Instructions (Signed)
Discharge Patient Instructions  Patient Details  Name: Cassie [name] MRN: [id number] Date of Birth: [birth year] Referring Provider:  Marshell Garfinkel, MD   Number of Visits: 53  Reason for Discharge:  Patient reached a stable level of exercise. Patient independent in their exercise. Patient has met program and personal goals.  Smoking History:  Social History   Tobacco Use  Smoking Status Former Smoker  . Packs/day: 0.10  . Years: 5.00  . Pack years: 0.50  . Types: Cigarettes  . Quit date: 02/21/2007  . Years since quitting: 12.9  Smokeless Tobacco Never Used  Tobacco Comment   social smoker    Diagnosis:  DOE (dyspnea on exertion)  Initial Exercise Prescription:  Initial Exercise Prescription - 09/10/19 0900      Date of Initial Exercise RX and Referring Provider   Date 09/10/19    Referring Provider Marshell Garfinkel MD      Treadmill   MPH 2.2    Grade 1    Minutes 15    METs 2.99      REL-XR   Level 1    Speed 50    Minutes 15    METs 2      T5 Nustep   Level 2    SPM 80    Minutes 15    METs 2      Prescription Details   Frequency (times per week) 3    Duration Progress to 30 minutes of continuous aerobic without signs/symptoms of physical distress      Intensity   THRR 40-80% of Max Heartrate 135-161    Ratings of Perceived Exertion 11-13    Perceived Dyspnea 0-4      Progression   Progression Continue to progress workloads to maintain intensity without signs/symptoms of physical distress.      Resistance Training   Training Prescription Yes    Weight 3 lb    Reps 10-15           Discharge Exercise Prescription (Final Exercise Prescription Changes):  Exercise Prescription Changes - 12/22/19 1000      Response to Exercise   Blood Pressure (Admit) 120/82    Blood Pressure (Exercise) 130/70    Blood Pressure (Exit) 122/80    Heart Rate (Admit) 102 bpm    Heart Rate (Exercise) 112 bpm    Heart Rate (Exit) 77 bpm    Oxygen  Saturation (Admit) 98 %    Oxygen Saturation (Exercise) 96 %    Oxygen Saturation (Exit) 96 %    Rating of Perceived Exertion (Exercise) 14    Perceived Dyspnea (Exercise) 3    Symptoms SOB    Duration Continue with 30 min of aerobic exercise without signs/symptoms of physical distress.    Intensity THRR unchanged      Progression   Progression Continue to progress workloads to maintain intensity without signs/symptoms of physical distress.    Average METs 3.3      Resistance Training   Training Prescription Yes    Weight 6 lb    Reps 10-15      Interval Training   Interval Training No      Treadmill   MPH 2.5    Grade 2.5    Minutes 15    METs 4      REL-XR   Level 4    Minutes 15    METs 2.4      Home Exercise Plan   Plans to continue exercise at [facility] (comment)  Exercising 2 days/wk with [facility] PT study on off days of rehab   Frequency Add 2 additional days to program exercise sessions.    Initial Home Exercises Provided 09/21/19           Functional Capacity:  6 Minute Walk    Row Name 09/10/19 0913 12/17/19 1746       6 Minute Walk   Phase Initial Discharge    Distance 1163 feet 1490 feet    Distance % Change -- 28.1 %    Distance Feet Change -- 325 ft    Walk Time 6 minutes 6 minutes    # of Rest Breaks 0 0    MPH 2.2 2.82    METS 3.96 4.44    RPE 14 15    Perceived Dyspnea  2 2.5    VO2 Peak 13.86 15.55    Symptoms Yes (comment) Yes (comment)    Comments SOB, coughing coughing    Resting HR 109 bpm 118 bpm    Resting BP 118/56 110/60    Resting Oxygen Saturation  97 % 98 %    Exercise Oxygen Saturation  during 6 min walk 95 % 95 %    Max Ex. HR 125 bpm 132 bpm    Max Ex. BP 144/74 130/80    2 Minute Post BP 132/70 124/78         Interval HR   1 Minute HR 123 121    2 Minute HR 120 132    3 Minute HR 118 129    4 Minute HR 116 126    5 Minute HR 118 126    6 Minute HR 125 129    2 Minute Post HR 111 122    Interval Heart  Rate? Yes Yes         Interval Oxygen   Interval Oxygen? Yes Yes    Baseline Oxygen Saturation % 97 % 98 %    1 Minute Oxygen Saturation % 96 % 95 %    1 Minute Liters of Oxygen 0 L  Room Air 0 L    2 Minute Oxygen Saturation % 95 % 96 %    2 Minute Liters of Oxygen 0 L 0 L    3 Minute Oxygen Saturation % 95 % 96 %    3 Minute Liters of Oxygen 0 L 0 L    4 Minute Oxygen Saturation % 96 % 96 %    4 Minute Liters of Oxygen 0 L 0 L    5 Minute Oxygen Saturation % 96 % 96 %    5 Minute Liters of Oxygen 0 L 0 L    6 Minute Oxygen Saturation % 96 % 96 %    6 Minute Liters of Oxygen 0 L 0 L    2 Minute Post Oxygen Saturation % 96 % 96 %    2 Minute Post Liters of Oxygen 0 L --           Nutrition & Weight - Outcomes:  Pre Biometrics - 09/10/19 0918      Pre Biometrics   Height 5' 5.6" (1.666 m)    Weight 206 lb 4.8 oz (93.6 kg)    BMI (Calculated) 33.71    Single Leg Stand 14 seconds           Post Biometrics - 12/17/19 1757       Post  Biometrics   Single Leg Stand 22.03 seconds  Nutrition:  Nutrition Therapy & Goals - 09/22/19 1210      Nutrition Therapy   Diet heart healthy, low Na, Pulmonary MNT    Protein (specify units) 75g    Fiber 25 grams    Whole Grain Foods 3 servings    Saturated Fats 12 max. grams    Fruits and Vegetables 5 servings/day    Sodium 1.5 grams      Personal Nutrition Goals   Nutrition Goal ST: Power bowls and buddhas LT: improve ADL, improving fitness, improve energy (changes on time of day, highs of 5/10)    Comments B: cereal or herberlife shake L: sandwich and fruit  - white bread S: granola bar or goldfish D: hello fresh meal, breakfast for dinner, or takeout. Feels SOB when eating and for a while after. Drinks: herbalife tea and water - drinks all day. Discussed heart healthy eating and meal planning. discussed pulmonary MNT.      Intervention Plan   Intervention Prescribe, educate and counsel regarding individualized  specific dietary modifications aiming towards targeted core components such as weight, hypertension, lipid management, diabetes, heart failure and other comorbidities.;Nutrition handout(s) given to patient.    Expected Outcomes Short Term Goal: Understand basic principles of dietary content, such as calories, fat, sodium, cholesterol and nutrients.;Short Term Goal: A plan has been developed with personal nutrition goals set during dietitian appointment.;Long Term Goal: Adherence to prescribed nutrition plan.           Nutrition Discharge:  Nutrition Assessments - 09/03/19 1050      MEDFICTS Scores   Pre Score 21           Education Questionnaire Score:   Goals reviewed with patient; copy given to patient.

## 2020-01-14 NOTE — Progress Notes (Signed)
Pulmonary Individual Treatment Plan  Patient Details  Name: SHAUNTAE REITMAN MRN: [id number] Date of Birth: 1970-12-12 Referring Provider:   Flowsheet Row Pulmonary Rehab from 09/10/2019 in [facility]  Referring Provider Marshell Garfinkel MD      Initial Encounter Date:  Flowsheet Row Pulmonary Rehab from 09/10/2019 in [facility] Cardiac and Pulmonary Rehab  Date 09/10/19      Visit Diagnosis: DOE (dyspnea on exertion)  Patient's Home Medications on Admission:  Current Outpatient Medications:  .  albuterol (PROVENTIL) (2.5 MG/3ML) 0.083% nebulizer solution, Take 3 mLs (2.5 mg total) by nebulization every 6 (six) hours as needed for wheezing or shortness of breath., Disp: 150 mL, Rfl: 0 .  amitriptyline (ELAVIL) 50 MG tablet, Take 50 mg by mouth at bedtime., Disp: , Rfl:  .  Biotin 5 MG CAPS, Take 1 capsule (5 mg total) by mouth daily., Disp: , Rfl: 0 .  chlorpheniramine (CHLOR-TRIMETON) 4 MG tablet, Take by mouth. 4 times daily, Disp: , Rfl:  .  cholecalciferol (VITAMIN D3) 25 MCG (1000 UNIT) tablet, Take 1,000 Units by mouth daily., Disp: , Rfl:  .  Dextromethorphan Polistirex (DELSYM PO), Take by mouth daily., Disp: , Rfl:  .  diltiazem (CARDIZEM CD) 240 MG 24 hr capsule, Take 1 capsule (240 mg total) by mouth daily., Disp: 90 capsule, Rfl: 3 .  famotidine (PEPCID) 20 MG tablet, One after supper, Disp: 30 tablet, Rfl: 11 .  gabapentin (NEURONTIN) 300 MG capsule, Take 1 capsule (300 mg total) by mouth 2 (two) times daily., Disp: 180 capsule, Rfl: 3 .  gabapentin (NEURONTIN) 600 MG tablet, Take 1 tablet (600 mg total) by mouth 3 (three) times daily., Disp: 180 tablet, Rfl: 0 .  ipratropium (ATROVENT) 0.02 % nebulizer solution, Inhale into the lungs., Disp: , Rfl:  .  pantoprazole (PROTONIX) 40 MG tablet, Take 1 tablet (40 mg total) by mouth daily. Take 30-60 min before first meal of the day, Disp: 30 tablet, Rfl: 2 .  predniSONE (DELTASONE) 20 MG tablet, Take 1 tablet (20  mg total) by mouth daily with breakfast. Take 55m daily for 5 days, Disp: 10 tablet, Rfl: 0 .  Respiratory Therapy Supplies (FLUTTER) DEVI, Use as directed, Disp: 1 each, Rfl: 0  Past Medical History: Past Medical History:  Diagnosis Date  . Allergy   . Asthma   . Cardiac disease   . Diffuse cystic mastopathy   . GERD (gastroesophageal reflux disease)   . Guttate psoriasis   . H/O syncope    cards work-up WNL (Hochrein) thought 2/2 overmedication vs arrhythmias  . Hx of endometriosis    hysterectomy  . Hx of migraines    occasional; improved since hysterectomy  . Labral tear of shoulder 02/2014   R, pending surgery ([facility]  . Rosacea    mild    Tobacco Use: Social History   Tobacco Use  Smoking Status Former Smoker  . Packs/day: 0.10  . Years: 5.00  . Pack years: 0.50  . Types: Cigarettes  . Quit date: 02/21/2007  . Years since quitting: 12.9  Smokeless Tobacco Never Used  Tobacco Comment   social smoker    Labs: Recent Review [employer], physiological   Labs for ITP Cardiac and Pulmonary Rehab Latest Ref Rng & Units 08/12/2009 02/23/2011 02/16/2016 12/13/2017   Cholestrol 0 - 200 mg/dL 168 160 158 174   LDLCALC 0 - 99 mg/dL 89 88 82 94   HDL >39.00 mg/dL 41.60 50.60 50 59.10  Trlycerides 0.0 - 149.0 mg/dL 187.0(H) 106.0 128 108.0       Pulmonary Assessment Scores:  Pulmonary Assessment Scores    Row Name 09/03/19 1051 09/10/19 1024 12/17/19 1757     ADL UCSD   ADL Phase Entry Entry --   SOB Score total 57 57 --   Rest 1 1 --   Walk 2 2 --   Stairs 4 4 --   Bath 2 2 --   Dress 2 2 --   Shop 3 3 --     CAT Score   CAT Score 25 25 --     mMRC Score   mMRC Score -- 3 2   Row Name 01/14/20 1743         ADL UCSD   SOB Score total 47     Rest 1     Walk 2     Stairs 5     Bath 2     Dress 1     Shop 2           CAT Score   CAT Score 20            UCSD: Self-administered rating of dyspnea associated with activities of daily living  (ADLs) 6-point scale (0 = "not at all" to 5 = "maximal or unable to do because of breathlessness")  Scoring Scores range from 0 to 120.  Minimally important difference is 5 units  CAT: CAT can identify the health impairment of COPD patients and is better correlated with disease progression.  CAT has a scoring range of zero to 40. The CAT score is classified into four groups of low (less than 10), medium (10 - 20), high (21-30) and very high (31-40) based on the impact level of disease on health status. A CAT score over 10 suggests significant symptoms.  A worsening CAT score could be explained by an exacerbation, poor medication adherence, poor inhaler technique, or progression of COPD or comorbid conditions.  CAT MCID is 2 points  mMRC: mMRC (Modified Medical Research Council) Dyspnea Scale is used to assess the degree of baseline functional disability in patients of respiratory disease due to dyspnea. No minimal important difference is established. A decrease in score of 1 point or greater is considered a positive change.   Pulmonary Function Assessment:   Exercise Target Goals: Exercise Program Goal: Individual exercise prescription set using results from initial 6 min walk test and THRR while considering  patient's activity barriers and safety.   Exercise Prescription Goal: Initial exercise prescription builds to 30-45 minutes a day of aerobic activity, 2-3 days per week.  Home exercise guidelines will be given to patient during program as part of exercise prescription that the participant will acknowledge.  Education: Aerobic Exercise: - Group verbal and visual presentation on the components of exercise prescription. Introduces F.I.T.T principle from ACSM for exercise prescriptions.  Reviews F.I.T.T. principles of aerobic exercise including progression. Written material given at graduation.   Education: Resistance Exercise: - Group verbal and visual presentation on the components of  exercise prescription. Introduces F.I.T.T principle from ACSM for exercise prescriptions  Reviews F.I.T.T. principles of resistance exercise including progression. Written material given at graduation.    Education: Exercise & Equipment Safety: - Individual verbal instruction and demonstration of equipment use and safety with use of the equipment. Flowsheet Row Pulmonary Rehab from 09/30/2019 in [facility] Cardiac [facility] Pulmonary Rehab  Date 09/10/19  Educator [facility]  Instruction Review Code 1- Verbalizes Understanding  Education: Exercise Physiology & General Exercise Guidelines: - Group verbal and written instruction with models to review the exercise physiology of the cardiovascular system and associated critical values. Provides general exercise guidelines with specific guidelines to those with heart or lung disease.    Education: Flexibility, Balance, Mind/Body Relaxation: - Group verbal and visual presentation with interactive activity on the components of exercise prescription. Introduces F.I.T.T principle from ACSM for exercise prescriptions. Reviews F.I.T.T. principles of flexibility and balance exercise training including progression. Also discusses the mind body connection.  Reviews various relaxation techniques to help reduce and manage stress (i.e. Deep breathing, progressive muscle relaxation, and visualization). Balance handout provided to take home. Written material given at graduation.   Activity Barriers & Risk Stratification:  Activity Barriers & Cardiac Risk Stratification - 09/10/19 0915      Activity Barriers & Cardiac Risk Stratification   Activity Barriers Deconditioning;Shortness of Breath;History of Falls;Muscular Weakness           6 Minute Walk:  6 Minute Walk    Row Name 09/10/19 0913 12/17/19 1746       6 Minute Walk   Phase Initial Discharge    Distance 1163 feet 1490 feet    Distance % Change -- 28.1 %    Distance Feet Change -- 325 ft    Walk Time 6  minutes 6 minutes    # of Rest Breaks 0 0    MPH 2.2 2.82    METS 3.96 4.44    RPE 14 15    Perceived Dyspnea  2 2.5    VO2 Peak 13.86 15.55    Symptoms Yes (comment) Yes (comment)    Comments SOB, coughing coughing    Resting HR 109 bpm 118 bpm    Resting BP 118/56 110/60    Resting Oxygen Saturation  97 % 98 %    Exercise Oxygen Saturation  during 6 min walk 95 % 95 %    Max Ex. HR 125 bpm 132 bpm    Max Ex. BP 144/74 130/80    2 Minute Post BP 132/70 124/78         Interval HR   1 Minute HR 123 121    2 Minute HR 120 132    3 Minute HR 118 129    4 Minute HR 116 126    5 Minute HR 118 126    6 Minute HR 125 129    2 Minute Post HR 111 122    Interval Heart Rate? Yes Yes         Interval Oxygen   Interval Oxygen? Yes Yes    Baseline Oxygen Saturation % 97 % 98 %    1 Minute Oxygen Saturation % 96 % 95 %    1 Minute Liters of Oxygen 0 L  Room Air 0 L    2 Minute Oxygen Saturation % 95 % 96 %    2 Minute Liters of Oxygen 0 L 0 L    3 Minute Oxygen Saturation % 95 % 96 %    3 Minute Liters of Oxygen 0 L 0 L    4 Minute Oxygen Saturation % 96 % 96 %    4 Minute Liters of Oxygen 0 L 0 L    5 Minute Oxygen Saturation % 96 % 96 %    5 Minute Liters of Oxygen 0 L 0 L    6 Minute Oxygen Saturation % 96 % 96 %    6  Minute Liters of Oxygen 0 L 0 L    2 Minute Post Oxygen Saturation % 96 % 96 %    2 Minute Post Liters of Oxygen 0 L --          Oxygen Initial Assessment:  Oxygen Initial Assessment - 09/03/19 1051      Home Oxygen   Home Oxygen Device None    Sleep Oxygen Prescription None    Home Exercise Oxygen Prescription None    Home Resting Oxygen Prescription None      Initial 6 min Walk   Oxygen Used None      Program Oxygen Prescription   Program Oxygen Prescription None      Intervention   Short Term Goals To learn and understand importance of monitoring SPO2 with pulse oximeter and demonstrate accurate use of the pulse oximeter.;To learn and understand  importance of maintaining oxygen saturations>88%;To learn and demonstrate proper pursed lip breathing techniques or other breathing techniques.;To learn and demonstrate proper use of respiratory medications    Long  Term Goals Verbalizes importance of monitoring SPO2 with pulse oximeter and return demonstration;Maintenance of O2 saturations>88%;Exhibits proper breathing techniques, such as pursed lip breathing or other method taught during program session;Compliance with respiratory medication;Demonstrates proper use of MDI's           Oxygen Re-Evaluation:  Oxygen Re-Evaluation    Row Name 09/14/19 1722 10/21/19 1723 11/30/19 1748 12/16/19 1727       Program Oxygen Prescription   Program Oxygen Prescription None None None None         Home Oxygen   Home Oxygen Device None None None None    Sleep Oxygen Prescription None None None None    Home Exercise Oxygen Prescription None None None None    Home Resting Oxygen Prescription None None None None         Goals/Expected Outcomes   Short Term Goals To learn and understand importance of monitoring SPO2 with pulse oximeter and demonstrate accurate use of the pulse oximeter.;To learn and understand importance of maintaining oxygen saturations>88%;To learn and demonstrate proper pursed lip breathing techniques or other breathing techniques.;To learn and demonstrate proper use of respiratory medications To learn and understand importance of maintaining oxygen saturations>88%;To learn and understand importance of monitoring SPO2 with pulse oximeter and demonstrate accurate use of the pulse oximeter. To learn and understand importance of maintaining oxygen saturations>88%;To learn and understand importance of monitoring SPO2 with pulse oximeter and demonstrate accurate use of the pulse oximeter. To learn and understand importance of maintaining oxygen saturations>88%;To learn and understand importance of monitoring SPO2 with pulse oximeter and  demonstrate accurate use of the pulse oximeter.    Long  Term Goals Verbalizes importance of monitoring SPO2 with pulse oximeter and return demonstration;Maintenance of O2 saturations>88%;Exhibits proper breathing techniques, such as pursed lip breathing or other method taught during program session;Compliance with respiratory medication;Demonstrates proper use of MDI's Verbalizes importance of monitoring SPO2 with pulse oximeter and return demonstration;Maintenance of O2 saturations>88% -- Verbalizes importance of monitoring SPO2 with pulse oximeter and return demonstration;Maintenance of O2 saturations>88%    Comments Reviewed PLB technique with pt.  Talked about how it works and it's importance in maintaining their exercise saturations. She has a pulse oximeter to check her oxygen saturation at home. Informed her why it is important to have one. Reviewed that oxygen saturations should be 88 percent and above. She checks her oxygen at home if she is having breathing issues. She knows to  be 88 percent and above when she is exercising. -- She is still checking her O2 at home, sometimes she feels like it's hard to catch her breath - her O2 has been above 88.  Her doctor doubled her Gabapentin which she is not happy about. After getting her booster she feels she has had a bit of a set back.    Goals/Expected Outcomes Short: Become more profiecient at using PLB.   Long: Become independent at using PLB. Short: monitor oxygen at home with exertion. Long: maintain oxygen saturations above 88 percent independently. -- Short: monitor oxygen at home with exertion. Long: maintain oxygen saturations above 88 percent independently.           Oxygen Discharge (Final Oxygen Re-Evaluation):  Oxygen Re-Evaluation - 12/16/19 1727      Program Oxygen Prescription   Program Oxygen Prescription None      Home Oxygen   Home Oxygen Device None    Sleep Oxygen Prescription None    Home Exercise Oxygen Prescription None     Home Resting Oxygen Prescription None      Goals/Expected Outcomes   Short Term Goals To learn and understand importance of maintaining oxygen saturations>88%;To learn and understand importance of monitoring SPO2 with pulse oximeter and demonstrate accurate use of the pulse oximeter.    Long  Term Goals Verbalizes importance of monitoring SPO2 with pulse oximeter and return demonstration;Maintenance of O2 saturations>88%    Comments She is still checking her O2 at home, sometimes she feels like it's hard to catch her breath - her O2 has been above 88.  Her doctor doubled her Gabapentin which she is not happy about. After getting her booster she feels she has had a bit of a set back.    Goals/Expected Outcomes Short: monitor oxygen at home with exertion. Long: maintain oxygen saturations above 88 percent independently.           Initial Exercise Prescription:  Initial Exercise Prescription - 09/10/19 0900      Date of Initial Exercise RX and Referring Provider   Date 09/10/19    Referring Provider Marshell Garfinkel MD      Treadmill   MPH 2.2    Grade 1    Minutes 15    METs 2.99      REL-XR   Level 1    Speed 50    Minutes 15    METs 2      T5 Nustep   Level 2    SPM 80    Minutes 15    METs 2      Prescription Details   Frequency (times per week) 3    Duration Progress to 30 minutes of continuous aerobic without signs/symptoms of physical distress      Intensity   THRR 40-80% of Max Heartrate 135-161    Ratings of Perceived Exertion 11-13    Perceived Dyspnea 0-4      Progression   Progression Continue to progress workloads to maintain intensity without signs/symptoms of physical distress.      Resistance Training   Training Prescription Yes    Weight 3 lb    Reps 10-15           Perform Capillary Blood Glucose checks as needed.  Exercise Prescription Changes:  Exercise Prescription Changes    Row Name 09/10/19 0900 09/16/19 1000 09/21/19 1700 09/29/19  0800 10/14/19 1000     Response to Exercise   Blood Pressure (Admit) 118/56 104/72 --  122/70 126/74   Blood Pressure (Exercise) 144/74 120/70 -- 130/72 128/72   Blood Pressure (Exit) 124/70 122/70 -- 112/68 122/82   Heart Rate (Admit) 109 bpm 107 bpm -- 103 bpm 102 bpm   Heart Rate (Exercise) 125 bpm 114 bpm -- 116 bpm 115 bpm   Heart Rate (Exit) 114 bpm 100 bpm -- 100 bpm 115 bpm   Oxygen Saturation (Admit) 97 % 97 % -- 98 % 98 %   Oxygen Saturation (Exercise) 95 % 96 % -- 97 % 96 %   Oxygen Saturation (Exit) 96 % 97 % -- 97 % 95 %   Rating of Perceived Exertion (Exercise) 14 13 -- 15 14   Perceived Dyspnea (Exercise) 2 2 -- 2 2   Symptoms SOB, coughing SOB, coughing -- -- SOB   Comments walk test results first full day of exercise -- -- --   Duration -- Progress to 30 minutes of  aerobic without signs/symptoms of physical distress Progress to 30 minutes of  aerobic without signs/symptoms of physical distress Progress to 30 minutes of  aerobic without signs/symptoms of physical distress Continue with 30 min of aerobic exercise without signs/symptoms of physical distress.   Intensity -- THRR unchanged THRR unchanged THRR unchanged THRR unchanged     Progression   Progression -- Continue to progress workloads to maintain intensity without signs/symptoms of physical distress. Continue to progress workloads to maintain intensity without signs/symptoms of physical distress. Continue to progress workloads to maintain intensity without signs/symptoms of physical distress. Continue to progress workloads to maintain intensity without signs/symptoms of physical distress.   Average METs -- 2.32 2.32 2.96 3.9     Resistance Training   Training Prescription -- Yes Yes Yes Yes   Weight -- 3 lb 3 lb 3 lb 3 lb   Reps -- 10-15 10-15 10-15 10-15     Interval Training   Interval Training -- No No No No     Treadmill   MPH -- 2.2 2.2 2.5 2.7   Grade -- 0.5 0.5 2 2.5   Minutes -- $RemoveBe'15 15 15 15   'TtAVIAuOf$ METs --  2.84 2.84 3.6 4     REL-XR   Level -- -- -- 2 4   Minutes -- -- -- 15 15   METs -- -- -- 2.6 3.8     T5 Nustep   Level -- $Remove'2 2 2 'ieDDBFx$ --   Minutes -- $RemoveBe'15 15 15 'RPFBWMwQF$ --   METs -- 1.8 1.8 1.9 --     Home Exercise Plan   Plans to continue exercise at -- -- [facility] (comment)  Exercising 2 days/wk with [facility] PT study on off days of rehab [facility] (comment)  Exercising 2 days/wk with [facility] De[facility] PT study on off days of rehab [facility] (comment)  Exercising 2 days/wk with [facility] PT study on off days of rehab   Frequency -- -- Add 2 additional days to program exercise sessions. Add 2 additional days to program exercise sessions. Add 2 additional days to program exercise sessions.   Initial Home Exercises Provided -- -- 09/21/19 09/21/19 09/21/19   Row Name 10/29/19 0900 11/11/19 1500 12/09/19 1000 12/22/19 1000       Response to Exercise   Blood Pressure (Admit) 110/72 114/74 110/62 120/82    Blood Pressure (Exercise) 142/60 122/70 118/68 130/70    Blood Pressure (Exit) 108/66 118/70 106/62 122/80    Heart Rate (Admit) 104 bpm 84 bpm 90 bpm 102 bpm  Heart Rate (Exercise) 117 bpm 116 bpm 122 bpm 112 bpm    Heart Rate (Exit) 112 bpm 110 bpm 100 bpm 77 bpm    Oxygen Saturation (Admit) 98 % 98 % 98 % 98 %    Oxygen Saturation (Exercise) 97 % 95 % 96 % 96 %    Oxygen Saturation (Exit) 97 % 98 % 97 % 96 %    Rating of Perceived Exertion (Exercise) 14 13 14 14     Perceived Dyspnea (Exercise) 2 1 2.5 3    Symptoms -- SOB SOB SOB    Duration Continue with 30 min of aerobic exercise without signs/symptoms of physical distress. Continue with 30 min of aerobic exercise without signs/symptoms of physical distress. Continue with 30 min of aerobic exercise without signs/symptoms of physical distress. Continue with 30 min of aerobic exercise without signs/symptoms of physical distress.    Intensity THRR unchanged THRR unchanged THRR unchanged THRR unchanged         Progression   Progression  Continue to progress workloads to maintain intensity without signs/symptoms of physical distress. Continue to progress workloads to maintain intensity without signs/symptoms of physical distress. Continue to progress workloads to maintain intensity without signs/symptoms of physical distress. Continue to progress workloads to maintain intensity without signs/symptoms of physical distress.    Average METs 4 3.3 3.1 3.3         Resistance Training   Training Prescription Yes Yes Yes Yes    Weight 6 lb 6 lb 6 lb 6 lb    Reps 10-15 10-15 10-15 10-15         Interval Training   Interval Training No No No No         Treadmill   MPH 2.7 2.7 2.7 2.5    Grade 2 2.5 2.5 2.5    Minutes 15 15 15 15     METs 4 4 4 4          REL-XR   Level 4 4 -- 4    Minutes 15 15 -- 15    METs 4 3.2 -- 2.4         T5 Nustep   Level -- 4 4 --    Minutes -- 15 15 --    METs -- 2 2.2 --         Home Exercise Plan   Plans to continue exercise at -- [facility] (comment)  Exercising 2 days/wk with [facility] PT study on off days of rehab [facility] (comment)  Exercising 2 days/wk with [facility] De[facility] PT study on off days of rehab [facility] (comment)  Exercising 2 days/wk with [facility] PT study on off days of rehab    Frequency -- Add 2 additional days to program exercise sessions. Add 2 additional days to program exercise sessions. Add 2 additional days to program exercise sessions.    Initial Home Exercises Provided -- 09/21/19 09/21/19 09/21/19           Exercise Comments:  Exercise Comments    Row Name 01/14/20 1729           Exercise Comments Izora Gala graduated today from  rehab with 36 sessions completed.  Details of the patient's exercise prescription and what She needs to do in order to continue the prescription and progress were discussed with patient.  Patient was given a copy of prescription and goals.  Patient verbalized understanding.  Izora Gala plans to continue to exercise by continuing to  exercise with the PT study at [facility]  [employer].              Exercise Goals and Review:  Exercise Goals    Row Name 09/10/19 0916             Exercise Goals   Increase Physical Activity Yes       Intervention Provide advice, education, support and counseling about physical activity/exercise needs.;Develop an individualized exercise prescription for aerobic and resistive training based on initial evaluation findings, risk stratification, comorbidities and participant's personal goals.       Expected Outcomes Short Term: Attend rehab on a regular basis to increase amount of physical activity.;Long Term: Add in home exercise to make exercise part of routine and to increase amount of physical activity.;Long Term: Exercising regularly at least 3-5 days a week.       Increase Strength and Stamina Yes       Intervention Provide advice, education, support and counseling about physical activity/exercise needs.;Develop an individualized exercise prescription for aerobic and resistive training based on initial evaluation findings, risk stratification, comorbidities and participant's personal goals.       Expected Outcomes Short Term: Increase workloads from initial exercise prescription for resistance, speed, and METs.;Short Term: Perform resistance training exercises routinely during rehab and add in resistance training at home;Long Term: Improve cardiorespiratory fitness, muscular endurance and strength as measured by increased METs and functional capacity (6MWT)       Able to understand and use rate of perceived exertion (RPE) scale Yes       Intervention Provide education and explanation on how to use RPE scale       Expected Outcomes Short Term: Able to use RPE daily in rehab to express subjective intensity level;Long Term:  Able to use RPE to guide intensity level when exercising independently       Able to understand and use Dyspnea scale Yes       Intervention Provide education and explanation  on how to use Dyspnea scale       Expected Outcomes Short Term: Able to use Dyspnea scale daily in rehab to express subjective sense of shortness of breath during exertion;Long Term: Able to use Dyspnea scale to guide intensity level when exercising independently       Knowledge and understanding of Target Heart Rate Range (THRR) Yes       Intervention Provide education and explanation of THRR including how the numbers were predicted and where they are located for reference       Expected Outcomes Short Term: Able to state/look up THRR;Short Term: Able to use daily as guideline for intensity in rehab;Long Term: Able to use THRR to govern intensity when exercising independently       Able to check pulse independently Yes       Intervention Provide education and demonstration on how to check pulse in carotid and radial arteries.;Review the importance of being able to check your own pulse for safety during independent exercise       Expected Outcomes Short Term: Able to explain why pulse checking is important during independent exercise;Long Term: Able to check pulse independently and accurately       Understanding of Exercise Prescription Yes       Intervention Provide education, explanation, and written materials on patient's individual exercise prescription       Expected Outcomes Short Term: Able to explain program exercise prescription;Long Term: Able to explain home exercise prescription to exercise independently  Exercise Goals Re-Evaluation :  Exercise Goals Re-Evaluation    Row Name 09/14/19 1720 09/21/19 1737 09/29/19 0851 10/14/19 1011 10/21/19 1734     Exercise Goal Re-Evaluation   Exercise Goals Review Increase Physical Activity;Able to understand and use rate of perceived exertion (RPE) scale;Knowledge and understanding of Target Heart Rate Range (THRR);Understanding of Exercise Prescription;Increase Strength and Stamina;Able to understand and use Dyspnea scale;Able to  check pulse independently Increase Physical Activity;Able to understand and use rate of perceived exertion (RPE) scale;Knowledge and understanding of Target Heart Rate Range (THRR);Understanding of Exercise Prescription;Increase Strength and Stamina;Able to understand and use Dyspnea scale;Able to check pulse independently Increase Physical Activity;Able to understand and use rate of perceived exertion (RPE) scale;Knowledge and understanding of Target Heart Rate Range (THRR);Understanding of Exercise Prescription;Increase Strength and Stamina;Able to understand and use Dyspnea scale;Able to check pulse independently Increase Physical Activity;Increase Strength and Stamina;Understanding of Exercise Prescription Increase Physical Activity;Increase Strength and Stamina   Comments Reviewed RPE and dyspnea scales, THR and program prescription with pt today.  Pt voiced understanding and was given a copy of goals to take home. Reviewed home exercise with pt today.  Pt plans to exercise 2 days a week with [facility] study of off days of rehab for exercise.  Reviewed THR, pulse, RPE, sign and symptoms, pulse oximetery and when to call 911 or MD.  Also discussed weather considerations and indoor options.  Pt voiced understanding. Valrie is doing well in rehab. She has already increased her treadmill speed to 2.5 and incline to 2%. She is adjusting well to the exercise machines. O2 sats are maintained well. Will continue to monitor progress. Leonora has continued to do well in rehab.  She now on level 4 for the XR and up to 2.7 mph on the treadmill.  We will continue to monitor her progress. Jeannemarie is going to start another PT program study for COVID 19. Her classes start in November so she will go there on her off days from [place].   Expected Outcomes Short: Use RPE daily to regulate intensity. Long: Follow program prescription in THR. Short: Continue to attend pulmonary rehab with consistent attendence and [facility] study on off  days of rehab. Long: Improve SOB and be able to maintain an idependent exercise program following Pulmonary Rehab. Short: Continue to increase workloads Long: Progress overall MET level/ increase strength and stamina Short: Try 4 lb hand weights  Long: Continue to improve stamina Short: Start PT at [place]. Long: Maintain exercise at [facility] and PT at rehab.   [place] Name 10/29/19 8527 11/11/19 1525 11/30/19 1733 12/09/19 1007 12/16/19 1722     Exercise Goal Re-Evaluation   Exercise Goals Review Increase Physical Activity;Increase Strength and Stamina Increase Physical Activity;Increase Strength and Stamina;Understanding of Exercise Prescription Increase Physical Activity;Increase Strength and Stamina;Understanding of Exercise Prescription Increase Physical Activity;Increase Strength and Stamina;Understanding of Exercise Prescription Increase Physical Activity;Increase Strength and Stamina;Understanding of Exercise Prescription   Comments Jennine is progressing well and averaging 4 METS and oxygen levels stay in high 90s.  She uses 6 lb weights for strength work.  Staff will monitor progress. Littie continues to do well in rehab.  She is up to level 4 on the T5 NuStep and 2.5% grade on the treadmill.  We will continue to monitor his progress. Today is Jhoana's first day back after missing 2 weeks.  She is going slower than ususal on T5 and wil adjust TM as needed. Akaysha returned on 11/29 and has been doing  well.  She is getting back slowly.  We will continue to monitor her progress. She is still doing PT 2x/week - on break right now. After rehab she will be going to [facility] facilities.   Expected Outcomes Short:  continue to exercise consistently Long:  build stamina and increase MET level Short: Continue to maintain 2.5% grade on treadmill  Long; Continue to improve stamina. Short: get back to conssistent exercise Long: get back to previous level Short: Continue to attend consistently again Long; Continue to improe  stamina. Short: Continue to attend consistently, continue with PT Long: Continue to improve stamina.   [place] Name 12/22/19 1003 01/05/20 1622 01/13/20 1726         Exercise Goal Re-Evaluation   Exercise Goals Review Increase Physical Activity;Increase Strength and Stamina -- Increase Physical Activity;Increase Strength and Stamina     Comments Emmaly improved her post 6MWT by 28%!  She will complete HT soon and plans to exercise at [facility] facility. 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     Expected Outcomes Short: complete LW program Long:  maintain exercise on her own -- Short: complete LW program Long:  maintain exercise on her own            Discharge Exercise Prescription (Final Exercise Prescription Changes):  Exercise Prescription Changes - 12/22/19 1000      Response to Exercise   Blood Pressure (Admit) 120/82    Blood Pressure (Exercise) 130/70    Blood Pressure (Exit) 122/80    Heart Rate (Admit) 102 bpm    Heart Rate (Exercise) 112 bpm    Heart Rate (Exit) 77 bpm    Oxygen Saturation (Admit) 98 %    Oxygen Saturation (Exercise) 96 %    Oxygen Saturation (Exit) 96 %    Rating of Perceived Exertion (Exercise) 14    Perceived Dyspnea (Exercise) 3    Symptoms SOB    Duration Continue with 30 min of aerobic exercise without signs/symptoms of physical distress.    Intensity THRR unchanged      Progression   Progression Continue to progress workloads to maintain intensity without signs/symptoms of physical distress.    Average METs 3.3      Resistance Training   Training Prescription Yes    Weight 6 lb    Reps 10-15      Interval Training   Interval Training No      Treadmill   MPH 2.5    Grade 2.5    Minutes 15    METs 4      REL-XR   Level 4    Minutes 15    METs 2.4      Home Exercise Plan   Plans to continue exercise at [facility] (comment)   Exercising 2 days/wk with [facility] study on off  days of rehab   Frequency Add 2 additional days to program exercise sessions.    Initial Home Exercises Provided 09/21/19           Nutrition:  Target Goals: Understanding of nutrition guidelines, daily intake of sodium '1500mg'$ , cholesterol '200mg'$ , calories 30% from fat and 7% or less from saturated fats, daily to have 5 or more servings of fruits and vegetables.  Education: All About Nutrition: -Group instruction provided by verbal, written material, interactive activities, discussions, models, and posters to present general guidelines for heart healthy nutrition including fat, fiber, MyPlate, the role of sodium in  heart healthy nutrition, utilization of the nutrition label, and utilization of this knowledge for meal planning. Follow up email sent as well. Written material given at graduation.   Biometrics:  Pre Biometrics - 09/10/19 0918      Pre Biometrics   Height 5' 5.6" (1.666 m)    Weight 206 lb 4.8 oz (93.6 kg)    BMI (Calculated) 33.71    Single Leg Stand 14 seconds           Post Biometrics - 12/17/19 1757       Post  Biometrics   Single Leg Stand 22.03 seconds           Nutrition Therapy Plan and Nutrition Goals:  Nutrition Therapy & Goals - 09/22/19 1210      Nutrition Therapy   Diet heart healthy, low Na, Pulmonary MNT    Protein (specify units) 75g    Fiber 25 grams    Whole Grain Foods 3 servings    Saturated Fats 12 max. grams    Fruits and Vegetables 5 servings/day    Sodium 1.5 grams      Personal Nutrition Goals   Nutrition Goal ST: Power bowls and buddhas LT: improve ADL, improving fitness, improve energy (changes on time of day, highs of 5/10)    Comments B: cereal or herberlife shake L: sandwich and fruit  - white bread S: granola bar or goldfish D: hello fresh meal, breakfast for dinner, or takeout. Feels SOB when eating and for a while after. Drinks: herbalife tea and water - drinks all day. Discussed heart healthy eating and meal planning.  discussed pulmonary MNT.      Intervention Plan   Intervention Prescribe, educate and counsel regarding individualized specific dietary modifications aiming towards targeted core components such as weight, hypertension, lipid management, diabetes, heart failure and other comorbidities.;Nutrition handout(s) given to patient.    Expected Outcomes Short Term Goal: Understand basic principles of dietary content, such as calories, fat, sodium, cholesterol and nutrients.;Short Term Goal: A plan has been developed with personal nutrition goals set during dietitian appointment.;Long Term Goal: Adherence to prescribed nutrition plan.           Nutrition Assessments:  Nutrition Assessments - 01/14/20 1745      Rate Your Plate Scores   Post Score --      MEDFICTS Scores   Post Score 36          MEDIFICTS Score Key:  ?70 Need to make dietary changes   40-70 Heart Healthy Diet  ? 40 Therapeutic Level Cholesterol Diet   Picture Your Plate Scores:  <81 Unhealthy dietary pattern with much room for improvement.  41-50 Dietary pattern unlikely to meet recommendations for good health and room for improvement.  51-60 More healthful dietary pattern, with some room for improvement.   >60 Healthy dietary pattern, although there may be some specific behaviors that could be improved.   Nutrition Goals Re-Evaluation:  Nutrition Goals Re-Evaluation    Row Name 10/28/19 1728 11/30/19 1733 12/16/19 1738 01/13/20 1730       Goals   Nutrition Goal ST: Power bowls and buddha bowls for lunch, try whole wheat bread LT: Whole wheat bread 1/2 the time, eat 8 servings of fruits and vegetables, meet protein and calorie needs -- ST: drink water with exercise LT: continue with heart healthy eating --    Comment She reports trying to eat healthy at lunch - Ex: leftover chicken, sandwich with cucumbers (white wheat). She also is  drining more water. She reports no other changes at this time. Aubryn is doing  Sales executive for dinners.  She is still working on incorporating more fruits and vegetables.  She is drinking more water. Semya is still doing Sales executive for dinners- she reports it takes the decision making out of dinner. breakfast: oatmeal or shake. Lunch: variety - soup. She reports getting brownies as a present.  She reports not feeling like she is not drinking enough water and would like to work on that. Laylaa is drinking more water now.  Still using Hello Fresh    Expected Outcome ST: Power bowls and buddha bowls for lunch, try whole wheat bread LT: Whole wheat bread 1/2 the time, eat 8 servings of fruits and vegetables, meet protein and calorie needs Short: continue to work on getting more fruits and veggies Long:  get full 8 servings of fruits and veggies ST: drink water with exercise LT: continue with heart healthy eating --           Nutrition Goals Discharge (Final Nutrition Goals Re-Evaluation):  Nutrition Goals Re-Evaluation - 01/13/20 1730      Goals   Comment Delvina is drinking more water now.  Still using Hello Fresh           Psychosocial: Target Goals: Acknowledge presence or absence of significant depression and/or stress, maximize coping skills, provide positive support system. Participant is able to verbalize types and ability to use techniques and skills needed for reducing stress and depression.   Education: Stress, Anxiety, and Depression - Group verbal and visual presentation to define topics covered.  Reviews how body is impacted by stress, anxiety, and depression.  Also discusses healthy ways to reduce stress and to treat/manage anxiety and depression.  Written material given at graduation.   Education: Sleep Hygiene -Provides group verbal and written instruction about how sleep can affect your health.  Define sleep hygiene, discuss sleep cycles and impact of sleep habits. Review good sleep hygiene tips.    Initial Review & Psychosocial Screening:  Initial Psych  Review & Screening - 09/03/19 1043      Initial Review   Current issues with Current Anxiety/Panic;Current Stress Concerns;Current Sleep Concerns    Source of Stress Concerns Chronic Illness;Family;Unable to participate in former interests or hobbies;Unable to perform yard/household activities    Comments Frustration form not being able to go and do from dealing with COVID recovery over last year, meds help with sleep and cough, mother just diagnosised with breast cancer this month      [place]? Yes   husband, mother and father, kids (daughter just moved to [place])     Barriers   Psychosocial barriers to participate in program There are no identifiable barriers or psychosocial needs.;The patient should benefit from training in stress management and relaxation.;Psychosocial barriers identified (see note)      Screening Interventions   Interventions Encouraged to exercise;Provide feedback about the scores to participant;To provide support and resources with identified psychosocial needs    Expected Outcomes Short Term goal: Utilizing psychosocial counselor, staff and physician to assist with identification of specific Stressors or current issues interfering with healing process. Setting desired goal for each stressor or current issue identified.;Long Term Goal: Stressors or current issues are controlled or eliminated.;Short Term goal: Identification and review with participant of any Quality of Life or Depression concerns found by scoring the questionnaire.;Long Term goal: The participant improves quality of Life and PHQ9 Scores as  seen by post scores and/or verbalization of changes           Quality of Life Scores:  Scores of 19 and below usually indicate a poorer quality of life in these areas.  A difference of  2-3 points is a clinically meaningful difference.  A difference of 2-3 points in the total score of the Quality of Life Index has been associated  with significant improvement in overall quality of life, self-image, physical symptoms, and general health in studies assessing change in quality of life.  PHQ-9: Recent Review Flowsheet Data    Depression screen [facility] 2/9 01/14/2020 09/10/2019 01/05/2019 12/20/2017   Decreased Interest 1 1 0 2   Down, Depressed, Hopeless 0 0 0 1   PHQ - 2 Score 1 1 0 3   Altered sleeping 1 1 - 2   Tired, decreased energy 2 2 - 2   Change in appetite 1 0 - 0   Feeling bad or failure about yourself  0 0 - 0   Trouble concentrating 0 0 - 0   Moving slowly or fidgety/restless 0 0 - 0   Suicidal thoughts 0 0 - 0   PHQ-9 Score 5 4 - 7   Difficult doing work/chores - Somewhat difficult - -     Interpretation of Total Score  Total Score Depression Severity:  1-4 = Minimal depression, 5-9 = Mild depression, 10-14 = Moderate depression, 15-19 = Moderately severe depression, 20-27 = Severe depression   Psychosocial Evaluation and Intervention:  Psychosocial Evaluation - 09/03/19 1052      Psychosocial Evaluation & Interventions   Interventions Stress management education;Encouraged to exercise with the program and follow exercise prescription    Comments 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  She was diganoised a year ago and is still recovering.  She still is having extreme SOB and hoarseness, and extreme fatigue.  She wants to be able to go into work and get out of the house to be able to do more and breathe better.  She is originally from [place] and went to [place] for school. Her husband works for school and her family is now in [place], [place].  She continues to struggle with some brain fog but it is starting to clear.  This has all really been frustrating for her as she is used to being able to just go and do.  She is hoping to be able to come to class after her work day so that she can continue to work as much as possible.  Her family has also just found out that her mom has breast cancer.   She is looking forward to feeling better.    Expected Outcomes Short: Attend rehab to improve stamina and SOB  Long: Continue to stay positive and    Continue Psychosocial Services  Follow up required by staff           Psychosocial Re-Evaluation:  Psychosocial Re-Evaluation    [place] Name 10/21/19 1729 12/16/19 1733 01/13/20 1727         Psychosocial Re-Evaluation   Current issues with Current Stress Concerns Current Stress Concerns --     Comments She is stressed that she is not back to normal since she has had COVID. Kierrah is able to keep coming to rehab and it is improving her mood. Her job has been patient with her and she is working full time at home. She reports stress and frustration with her  progress. SHe is also stressed about her job and how long they will be flexible. She crafts to relieve stress. She reports her family is a good support system for her - especially her husband, daughter, and parents. Her parents kept her active when she was first recovering and her husband was at work. Carolyn says she has some stress from being back at work and not being able to work fully and not travel or go in.  She still does crafts to relieve stress.     Expected Outcomes Short: Continue to exercise regularly to support mental health and notify staff of any changes. Long: maintain mental health and well being through teaching of rehab or prescribed medications independently. Short: Continue to exercise regularly and craft to support mental health  Long: maintain mental health by using support system, continue exercise, and crafting Short: continue to exercise and craft Long:  maintain positiv e outlook     Interventions Encouraged to attend Pulmonary Rehabilitation for the exercise Encouraged to attend Pulmonary Rehabilitation for the exercise --     Continue Psychosocial Services  Follow up required by staff -- --     Comments -- Frustration form not being able to go and do from dealing with COVID  recovery over last year is her biggest stressor. --           Initial Review   Source of Stress Concerns -- Chronic Illness;Family;Unable to participate in former interests or hobbies;Unable to perform yard/household activities --            Psychosocial Discharge (Final Psychosocial Re-Evaluation):  Psychosocial Re-Evaluation - 01/13/20 1727      Psychosocial Re-Evaluation   Comments Lillymae says she has some stress from being back at work and not being able to work fully and not travel or go in.  She still does crafts to relieve stress.    Expected Outcomes Short: continue to exercise and craft Long:  maintain positiv e outlook           Education: Education Goals: Education classes will be provided on a weekly basis, covering required topics. Participant will state understanding/return demonstration of topics presented.  Learning Barriers/Preferences:  Learning Barriers/Preferences - 09/03/19 1042      Learning Barriers/Preferences   Learning Barriers Sight   glasses to drive   Learning Preferences Skilled Demonstration           General Pulmonary Education Topics:  Infection Prevention: - Provides verbal and written material to individual with discussion of infection control including proper hand washing and proper equipment cleaning during exercise session. Flowsheet Row Pulmonary Rehab from 09/30/2019 in [facility] Cardiac and Pulmonary Rehab  Date 09/10/19  Educator [facility]  Instruction Review Code 1- Verbalizes Understanding      Falls Prevention: - Provides verbal and written material to individual with discussion of falls prevention and safety. Flowsheet Row Pulmonary Rehab from 09/30/2019 in [facility] Cardiac and Pulmonary Rehab  Date 09/10/19  Educator [facility]  Instruction Review Code 1- Verbalizes Understanding      Chronic Lung Disease Review: - Group verbal instruction with posters, models, PowerPoint presentations and videos,  to review new updates, new respiratory  medications, new advancements in procedures and treatments. Providing information on websites and "800" numbers for continued self-education. Includes information about supplement oxygen, available portable oxygen systems, continuous and intermittent flow rates, oxygen safety, concentrators, and Medicare reimbursement for oxygen. Explanation of Pulmonary Drugs, including class, frequency, complications, importance of spacers, rinsing mouth after steroid MDI's, and  proper cleaning methods for nebulizers. Review of basic lung anatomy and physiology related to function, structure, and complications of lung disease. Review of risk factors. Discussion about methods for diagnosing sleep apnea and types of masks and machines for OSA. Includes a review of the use of types of environmental controls: home humidity, furnaces, filters, dust mite/pet prevention, HEPA vacuums. Discussion about weather changes, air quality and the benefits of nasal washing. Instruction on Warning signs, infection symptoms, calling MD promptly, preventive modes, and value of vaccinations. Review of effective airway clearance, coughing and/or vibration techniques. Emphasizing that all should Create an Action Plan. Written material given at graduation. Flowsheet Row Pulmonary Rehab from 09/30/2019 in [facility] Cardiac and Pulmonary Rehab  Date 09/10/19  Instruction Review Code 3- Needs Reinforcement  [need identified]      AED/CPR: - Group verbal and written instruction with the use of models to demonstrate the basic use of the AED with the basic ABC's of resuscitation.    Anatomy and Cardiac Procedures: - Group verbal and visual presentation and models provide information about basic cardiac anatomy and function. Reviews the testing methods done to diagnose heart disease and the outcomes of the test results. Describes the treatment choices: Medical Management, Angioplasty, or Coronary Bypass Surgery for treating various heart conditions  including Myocardial Infarction, Angina, Valve Disease, and Cardiac Arrhythmias.  Written material given at graduation.   Medication Safety: - Group verbal and visual instruction to review commonly prescribed medications for heart and lung disease. Reviews the medication, class of the drug, and side effects. Includes the steps to properly store meds and maintain the prescription regimen.  Written material given at graduation. Flowsheet Row Pulmonary Rehab from 09/30/2019 in [facility] Cardiac and Pulmonary Rehab  Date 09/16/19  Educator sb  Instruction Review Code 1- Verbalizes Understanding      Other: -Provides group and verbal instruction on various topics (see comments)   Knowledge Questionnaire Score:  Knowledge Questionnaire Score - 01/14/20 1741      Knowledge Questionnaire Score   Post Score 16/18            Core Components/Risk Factors/Patient Goals at Admission:  Personal Goals and Risk Factors at Admission - 09/10/19 0918      Core Components/Risk Factors/Patient Goals on Admission    Weight Management Yes;Obesity;Weight Loss    Intervention Weight Management: Develop a combined nutrition and exercise program designed to reach desired caloric intake, while maintaining appropriate intake of nutrient and fiber, sodium and fats, and appropriate energy expenditure required for the weight goal.;Weight Management: Provide education and appropriate resources to help participant work on and attain dietary goals.;Weight Management/Obesity: Establish reasonable short term and long term weight goals.;Obesity: Provide education and appropriate resources to help participant work on and attain dietary goals.    Admit Weight 206 lb 4.8 oz (93.6 kg)    Goal Weight: Short Term 200 lb (90.7 kg)    Goal Weight: Long Term 195 lb (88.5 kg)    Expected Outcomes Short Term: Continue to assess and modify interventions until short term weight is achieved;Long Term: Adherence to nutrition and physical  activity/exercise program aimed toward attainment of established weight goal;Weight Loss: Understanding of general recommendations for a balanced deficit meal plan, which promotes 1-2 lb weight loss per week and includes a negative energy balance of [phone] kcal/d;Understanding recommendations for meals to include 15-35% energy as protein, 25-35% energy from fat, 35-60% energy from carbohydrates, less than $RemoveB'200mg'[id number]$  of dietary cholesterol, 20-35 gm of total fiber daily;Understanding of  distribution of calorie intake throughout the day with the consumption of 4-5 meals/snacks    Improve shortness of breath with ADL's Yes    Intervention Provide education, individualized exercise plan and daily activity instruction to help decrease symptoms of SOB with activities of daily living.    Expected Outcomes Short Term: Improve cardiorespiratory fitness to achieve a reduction of symptoms when performing ADLs;Long Term: Be able to perform more ADLs without symptoms or delay the onset of symptoms           Education:Diabetes - Individual verbal and written instruction to review signs/symptoms of diabetes, desired ranges of glucose level fasting, after meals and with exercise. Acknowledge that pre and post exercise glucose checks will be done for 3 sessions at entry of program.   Know Your Numbers and Heart Failure: - Group verbal and visual instruction to discuss disease risk factors for cardiac and pulmonary disease and treatment options.  Reviews associated critical values for Overweight/Obesity, Hypertension, Cholesterol, and Diabetes.  Discusses basics of heart failure: signs/symptoms and treatments.  Introduces Heart Failure Zone chart for action plan for heart failure.  Written material given at graduation.   Core Components/Risk Factors/Patient Goals Review:   Goals and Risk Factor Review    Row Name 10/21/19 1726 11/30/19 1730 12/16/19 1743 01/13/20 1724       Core Components/Risk Factors/Patient  Goals Review   Personal Goals Review Improve shortness of breath with ADL's Improve shortness of breath with ADL's Improve shortness of breath with ADL's Improve shortness of breath with ADL's    Review She is able to do some chores without being short of breath. When she vaccuums is when she gets a little short of breath.Spoke to patient about their shortness of breath and what they can do to improve. Patient has been informed of breathing techniques when starting the program. Patient is informed to tell staff if they have had any med changes and that certain meds they are taking or not taking can be causing shortness of breath. Ashtin was feeling better before getting the Covid vaccine.  She feels she's had a major setback.  She is back on albuterol and just finished prednisone. Tanaka was feeling better before getting the Covid vaccine, however, she feels she's had a major setback since your first vaccine, but feels she is improving.  She continues to exercise and practice PLB. Signora feels she has gained back some of her stamina.  She was not able to walk to the end of her driveway without getting out of breath.  She was glad to get out of the house and get moving again.    Expected Outcomes Short: Attend [facility] regularly to improve shortness of breath with ADL's. Long: maintain independence with ADL's Short: get back to regular exercise Long: work up to previous levels Short: get back to regular exercise once PT break is over Long: work up to previous levels Short: complete LW Long: maintain exercise on her own           Core Components/Risk Factors/Patient Goals at Discharge (Final Review):   Goals and Risk Factor Review - 01/13/20 1724      Core Components/Risk Factors/Patient Goals Review   Personal Goals Review Improve shortness of breath with ADL's    Review Jeralyn feels she has gained back some of her stamina.  She was not able to walk to the end of her driveway without getting out of breath.   She was glad to get out of the  house and get moving again.    Expected Outcomes Short: complete LW Long: maintain exercise on her own           ITP Comments:  ITP Comments    Row Name 09/03/19 1059 09/10/19 0913 09/14/19 1718 09/22/19 1232 10/07/19 0555   ITP Comments Completed virtual orientation today.  EP evaluation is scheduled for Thurs 9/9 at 730am.  Documentation for diagnosis can be found in [facility] encounter 08/21/19. Completed 6MWT and gym orientation. Initial ITP created and sent for review to Dr. Emily Filbert, Medical Director. First full day of exercise!  Patient was oriented to gym and equipment including functions, settings, policies, and procedures.  Patient's individual exercise prescription and treatment plan were reviewed.  All starting workloads were established based on the results of the 6 minute walk test done at initial orientation visit.  The plan for exercise progression was also introduced and progression will be customized based on patient's performance and goals. Completed Initial RD Evaluation 30 Day review completed. Medical Director ITP review done, changes made as directed, and signed approval by Medical Director.   Row Name 11/04/19 0653 12/02/19 1017 12/30/19 0603 01/05/20 1621 01/14/20 1729   ITP Comments 30 Day review completed. Medical Director ITP review done, changes made as directed, and signed approval by Medical Director. 30 Day review completed. Medical Director ITP review done, changes made as directed, and signed approval by Medical Director. 30 Day review completed. Medical Director ITP review done, changes made as directed, and signed approval by Medical Director. Takeyah has been out with family for the holidays.  She is supposed to return this week. Simona graduated today from  rehab with 36 sessions completed.  Details of the patient's exercise prescription and what She needs to do in order to continue the prescription and progress were discussed with patient.   Patient was given a copy of prescription and goals.  Patient verbalized understanding.  Izora Gala plans to continue to exercise by continuing to exercise with the PT study at [facility].          Comments: Discharge ITP

## 2020-01-14 NOTE — Progress Notes (Signed)
Discharge Progress Report  Patient Details  Name: AILED [name] MRN: [id number] Date of Birth: [birth year] Referring Provider:   Flowsheet Row Pulmonary Rehab from 09/10/2019 in [facility]  Referring Provider Marshell Garfinkel MD       Number of Visits: 3  Reason for Discharge:  Patient reached a stable level of exercise. Patient independent in their exercise.  Smoking History:  Social History   Tobacco Use  Smoking Status Former Smoker  . Packs/day: 0.10  . Years: 5.00  . Pack years: 0.50  . Types: Cigarettes  . Quit date: 02/21/2007  . Years since quitting: 12.9  Smokeless Tobacco Never Used  Tobacco Comment   social smoker    Diagnosis:  DOE (dyspnea on exertion)  ADL UCSD:  Pulmonary Assessment Scores    Row Name 09/03/19 1051 09/10/19 1024 12/17/19 1757     ADL UCSD   ADL Phase Entry Entry -   SOB Score total 57 57 -   Rest 1 1 -   Walk 2 2 -   Stairs 4 4 -   Bath 2 2 -   Dress 2 2 -   Shop 3 3 -     CAT Score   CAT Score 25 25 -     mMRC Score   mMRC Score - 3 2          Initial Exercise Prescription:  Initial Exercise Prescription - 09/10/19 0900      Date of Initial Exercise RX and Referring Provider   Date 09/10/19    Referring Provider Marshell Garfinkel MD      Treadmill   MPH 2.2    Grade 1    Minutes 15    METs 2.99      REL-XR   Level 1    Speed 50    Minutes 15    METs 2      T5 Nustep   Level 2    SPM 80    Minutes 15    METs 2      Prescription Details   Frequency (times per week) 3    Duration Progress to 30 minutes of continuous aerobic without signs/symptoms of physical distress      Intensity   THRR 40-80% of Max Heartrate 135-161    Ratings of Perceived Exertion 11-13    Perceived Dyspnea 0-4      Progression   Progression Continue to progress workloads to maintain intensity without signs/symptoms of physical distress.      Resistance Training   Training Prescription Yes    Weight  3 lb    Reps 10-15           Discharge Exercise Prescription (Final Exercise Prescription Changes):  Exercise Prescription Changes - 12/22/19 1000      Response to Exercise   Blood Pressure (Admit) 120/82    Blood Pressure (Exercise) 130/70    Blood Pressure (Exit) 122/80    Heart Rate (Admit) 102 bpm    Heart Rate (Exercise) 112 bpm    Heart Rate (Exit) 77 bpm    Oxygen Saturation (Admit) 98 %    Oxygen Saturation (Exercise) 96 %    Oxygen Saturation (Exit) 96 %    Rating of Perceived Exertion (Exercise) 14    Perceived Dyspnea (Exercise) 3    Symptoms SOB    Duration Continue with 30 min of aerobic exercise without signs/symptoms of physical distress.    Intensity THRR unchanged  Progression   Progression Continue to progress workloads to maintain intensity without signs/symptoms of physical distress.    Average METs 3.3      Resistance Training   Training Prescription Yes    Weight 6 lb    Reps 10-15      Interval Training   Interval Training No      Treadmill   MPH 2.5    Grade 2.5    Minutes 15    METs 4      REL-XR   Level 4    Minutes 15    METs 2.4      Home Exercise Plan   Plans to continue exercise at [employer] [employer] (comment)   Exercising 2 days/wk with [employer] PT study on off days of rehab   Frequency Add 2 additional days to program exercise sessions.    Initial Home Exercises Provided 09/21/19           Functional Capacity:  6 Minute Walk    Row Name 09/10/19 0913 12/17/19 1746       6 Minute Walk   Phase Initial Discharge    Distance 1163 feet 1490 feet    Distance % Change - 28.1 %    Distance Feet Change - 325 ft    Walk Time 6 minutes 6 minutes    # of Rest Breaks 0 0    MPH 2.2 2.82    METS 3.96 4.44    RPE 14 15    Perceived Dyspnea  2 2.5    VO2 Peak 13.86 15.55    Symptoms Yes (comment) Yes (comment)    Comments SOB, coughing coughing    Resting HR 109 bpm 118 bpm    Resting BP 118/56 110/60    Resting Oxygen  Saturation  97 % 98 %    Exercise Oxygen Saturation  during 6 min walk 95 % 95 %    Max Ex. HR 125 bpm 132 bpm    Max Ex. BP 144/74 130/80    2 Minute Post BP 132/70 124/78         Interval HR   1 Minute HR 123 121    2 Minute HR 120 132    3 Minute HR 118 129    4 Minute HR 116 126    5 Minute HR 118 126    6 Minute HR 125 129    2 Minute Post HR 111 122    Interval Heart Rate? Yes Yes         Interval Oxygen   Interval Oxygen? Yes Yes    Baseline Oxygen Saturation % 97 % 98 %    1 Minute Oxygen Saturation % 96 % 95 %    1 Minute Liters of Oxygen 0 L  Room Air 0 L    2 Minute Oxygen Saturation % 95 % 96 %    2 Minute Liters of Oxygen 0 L 0 L    3 Minute Oxygen Saturation % 95 % 96 %    3 Minute Liters of Oxygen 0 L 0 L    4 Minute Oxygen Saturation % 96 % 96 %    4 Minute Liters of Oxygen 0 L 0 L    5 Minute Oxygen Saturation % 96 % 96 %    5 Minute Liters of Oxygen 0 L 0 L    6 Minute Oxygen Saturation % 96 % 96 %    6 Minute Liters of Oxygen 0 L  0 L    2 Minute Post Oxygen Saturation % 96 % 96 %    2 Minute Post Liters of Oxygen 0 L -           Nutrition & Weight - Outcomes:  Pre Biometrics - 09/10/19 0918      Pre Biometrics   Height 5' 5.6" (1.666 m)    Weight 206 lb 4.8 oz (93.6 kg)    BMI (Calculated) 33.71    Single Leg Stand 14 seconds           Nutrition:  Nutrition Therapy & Goals - 09/22/19 1210      Nutrition Therapy   Diet heart healthy, low Na, Pulmonary MNT    Protein (specify units) 75g    Fiber 25 grams    Whole Grain Foods 3 servings    Saturated Fats 12 max. grams    Fruits and Vegetables 5 servings/day    Sodium 1.5 grams      Personal Nutrition Goals   Nutrition Goal ST: Power bowls and buddhas LT: improve ADL, improving fitness, improve energy (changes on time of day, highs of 5/10)    Comments B: cereal or herberlife shake L: sandwich and fruit  - white bread S: granola bar or goldfish D: hello fresh meal, breakfast for  dinner, or takeout. Feels SOB when eating and for a while after. Drinks: herbalife tea and water - drinks all day. Discussed heart healthy eating and meal planning. discussed pulmonary MNT.      Intervention Plan   Intervention Prescribe, educate and counsel regarding individualized specific dietary modifications aiming towards targeted core components such as weight, hypertension, lipid management, diabetes, heart failure and other comorbidities.;Nutrition handout(s) given to patient.    Expected Outcomes Short Term Goal: Understand basic principles of dietary content, such as calories, fat, sodium, cholesterol and nutrients.;Short Term Goal: A plan has been developed with personal nutrition goals set during dietitian appointment.;Long Term Goal: Adherence to prescribed nutrition plan.          Goals reviewed with patient; copy given to patient.

## 2020-01-14 NOTE — Progress Notes (Signed)
Pulmonary Individual Treatment Plan  Patient Details  Name: Cassie [name] MRN: [id number] Date of Birth: 1970-12-12 Referring Provider:   Flowsheet Row Pulmonary Rehab from 09/10/2019 in [facility]  Referring Provider Marshell Garfinkel MD      Initial Encounter Date:  Flowsheet Row Pulmonary Rehab from 09/10/2019 in [facility] Cardiac and Pulmonary Rehab  Date 09/10/19      Visit Diagnosis: DOE (dyspnea on exertion)  Patient's Home Medications on Admission:  Current Outpatient Medications:  .  albuterol (PROVENTIL) (2.5 MG/3ML) 0.083% nebulizer solution, Take 3 mLs (2.5 mg total) by nebulization every 6 (six) hours as needed for wheezing or shortness of breath., Disp: 150 mL, Rfl: 0 .  amitriptyline (ELAVIL) 50 MG tablet, Take 50 mg by mouth at bedtime., Disp: , Rfl:  .  Biotin 5 MG CAPS, Take 1 capsule (5 mg total) by mouth daily., Disp: , Rfl: 0 .  chlorpheniramine (CHLOR-TRIMETON) 4 MG tablet, Take by mouth. 4 times daily, Disp: , Rfl:  .  cholecalciferol (VITAMIN D3) 25 MCG (1000 UNIT) tablet, Take 1,000 Units by mouth daily., Disp: , Rfl:  .  Dextromethorphan Polistirex (DELSYM PO), Take by mouth daily., Disp: , Rfl:  .  diltiazem (CARDIZEM CD) 240 MG 24 hr capsule, Take 1 capsule (240 mg total) by mouth daily., Disp: 90 capsule, Rfl: 3 .  famotidine (PEPCID) 20 MG tablet, One after supper, Disp: 30 tablet, Rfl: 11 .  gabapentin (NEURONTIN) 300 MG capsule, Take 1 capsule (300 mg total) by mouth 2 (two) times daily., Disp: 180 capsule, Rfl: 3 .  gabapentin (NEURONTIN) 600 MG tablet, Take 1 tablet (600 mg total) by mouth 3 (three) times daily., Disp: 180 tablet, Rfl: 0 .  ipratropium (ATROVENT) 0.02 % nebulizer solution, Inhale into the lungs., Disp: , Rfl:  .  pantoprazole (PROTONIX) 40 MG tablet, Take 1 tablet (40 mg total) by mouth daily. Take 30-60 min before first meal of the day, Disp: 30 tablet, Rfl: 2 .  predniSONE (DELTASONE) 20 MG tablet, Take 1 tablet (20  mg total) by mouth daily with breakfast. Take 55m daily for 5 days, Disp: 10 tablet, Rfl: 0 .  Respiratory Therapy Supplies (FLUTTER) DEVI, Use as directed, Disp: 1 each, Rfl: 0  Past Medical History: Past Medical History:  Diagnosis Date  . Allergy   . Asthma   . Cardiac disease   . Diffuse cystic mastopathy   . GERD (gastroesophageal reflux disease)   . Guttate psoriasis   . H/O syncope    cards work-up WNL (Hochrein) thought 2/2 overmedication vs arrhythmias  . Hx of endometriosis    hysterectomy  . Hx of migraines    occasional; improved since hysterectomy  . Labral tear of shoulder 02/2014   R, pending surgery ([facility]  . Rosacea    mild    Tobacco Use: Social History   Tobacco Use  Smoking Status Former Smoker  . Packs/day: 0.10  . Years: 5.00  . Pack years: 0.50  . Types: Cigarettes  . Quit date: 02/21/2007  . Years since quitting: 12.9  Smokeless Tobacco Never Used  Tobacco Comment   social smoker    Labs: Recent Review [employer], physiological   Labs for ITP Cardiac and Pulmonary Rehab Latest Ref Rng & Units 08/12/2009 02/23/2011 02/16/2016 12/13/2017   Cholestrol 0 - 200 mg/dL 168 160 158 174   LDLCALC 0 - 99 mg/dL 89 88 82 94   HDL >39.00 mg/dL 41.60 50.60 50 59.10  Trlycerides 0.0 - 149.0 mg/dL 187.0(H) 106.0 128 108.0       Pulmonary Assessment Scores:  Pulmonary Assessment Scores    Row Name 09/03/19 1051 09/10/19 1024 12/17/19 1757     ADL UCSD   ADL Phase Entry Entry -   SOB Score total 57 57 -   Rest 1 1 -   Walk 2 2 -   Stairs 4 4 -   Bath 2 2 -   Dress 2 2 -   Shop 3 3 -     CAT Score   CAT Score 25 25 -     mMRC Score   mMRC Score - 3 2          UCSD: Self-administered rating of dyspnea associated with activities of daily living (ADLs) 6-point scale (0 = "not at all" to 5 = "maximal or unable to do because of breathlessness")  Scoring Scores range from 0 to 120.  Minimally important difference is 5 units  CAT: CAT can identify  the health impairment of COPD patients and is better correlated with disease progression.  CAT has a scoring range of zero to 40. The CAT score is classified into four groups of low (less than 10), medium (10 - 20), high (21-30) and very high (31-40) based on the impact level of disease on health status. A CAT score over 10 suggests significant symptoms.  A worsening CAT score could be explained by an exacerbation, poor medication adherence, poor inhaler technique, or progression of COPD or comorbid conditions.  CAT MCID is 2 points  mMRC: mMRC (Modified Medical Research Council) Dyspnea Scale is used to assess the degree of baseline functional disability in patients of respiratory disease due to dyspnea. No minimal important difference is established. A decrease in score of 1 point or greater is considered a positive change.   Pulmonary Function Assessment:   Exercise Target Goals: Exercise Program Goal: Individual exercise prescription set using results from initial 6 min walk test and THRR while considering  patient's activity barriers and safety.   Exercise Prescription Goal: Initial exercise prescription builds to 30-45 minutes a day of aerobic activity, 2-3 days per week.  Home exercise guidelines will be given to patient during program as part of exercise prescription that the participant will acknowledge.  Education: Aerobic Exercise: - Group verbal and visual presentation on the components of exercise prescription. Introduces F.I.T.T principle from ACSM for exercise prescriptions.  Reviews F.I.T.T. principles of aerobic exercise including progression. Written material given at graduation.   Education: Resistance Exercise: - Group verbal and visual presentation on the components of exercise prescription. Introduces F.I.T.T principle from ACSM for exercise prescriptions  Reviews F.I.T.T. principles of resistance exercise including progression. Written material given at graduation.     Education: Exercise & Equipment Safety: - Individual verbal instruction and demonstration of equipment use and safety with use of the equipment. Flowsheet Row Pulmonary Rehab from 09/30/2019 in [facility] Cardiac [facility] Pulmonary Rehab  Date 09/10/19  Educator [facility]  Instruction Review Code 1- Verbalizes Understanding      Education: Exercise Physiology & General Exercise Guidelines: - Group verbal and written instruction with models to review the exercise physiology of the cardiovascular system and associated critical values. Provides general exercise guidelines with specific guidelines to those with heart or lung disease.    Education: Flexibility, Balance, Mind/Body Relaxation: - Group verbal and visual presentation with interactive activity on the components of exercise prescription. Introduces F.I.T.T principle from ACSM for exercise prescriptions. Reviews  F.I.T.T. principles of flexibility and balance exercise training including progression. Also discusses the mind body connection.  Reviews various relaxation techniques to help reduce and manage stress (i.e. Deep breathing, progressive muscle relaxation, and visualization). Balance handout provided to take home. Written material given at graduation.   Activity Barriers & Risk Stratification:  Activity Barriers & Cardiac Risk Stratification - 09/10/19 0915      Activity Barriers & Cardiac Risk Stratification   Activity Barriers Deconditioning;Shortness of Breath;History of Falls;Muscular Weakness           6 Minute Walk:  6 Minute Walk    Row Name 09/10/19 0913 12/17/19 1746       6 Minute Walk   Phase Initial Discharge    Distance 1163 feet 1490 feet    Distance % Change - 28.1 %    Distance Feet Change - 325 ft    Walk Time 6 minutes 6 minutes    # of Rest Breaks 0 0    MPH 2.2 2.82    METS 3.96 4.44    RPE 14 15    Perceived Dyspnea  2 2.5    VO2 Peak 13.86 15.55    Symptoms Yes (comment) Yes (comment)    Comments SOB,  coughing coughing    Resting HR 109 bpm 118 bpm    Resting BP 118/56 110/60    Resting Oxygen Saturation  97 % 98 %    Exercise Oxygen Saturation  during 6 min walk 95 % 95 %    Max Ex. HR 125 bpm 132 bpm    Max Ex. BP 144/74 130/80    2 Minute Post BP 132/70 124/78         Interval HR   1 Minute HR 123 121    2 Minute HR 120 132    3 Minute HR 118 129    4 Minute HR 116 126    5 Minute HR 118 126    6 Minute HR 125 129    2 Minute Post HR 111 122    Interval Heart Rate? Yes Yes         Interval Oxygen   Interval Oxygen? Yes Yes    Baseline Oxygen Saturation % 97 % 98 %    1 Minute Oxygen Saturation % 96 % 95 %    1 Minute Liters of Oxygen 0 L  Room Air 0 L    2 Minute Oxygen Saturation % 95 % 96 %    2 Minute Liters of Oxygen 0 L 0 L    3 Minute Oxygen Saturation % 95 % 96 %    3 Minute Liters of Oxygen 0 L 0 L    4 Minute Oxygen Saturation % 96 % 96 %    4 Minute Liters of Oxygen 0 L 0 L    5 Minute Oxygen Saturation % 96 % 96 %    5 Minute Liters of Oxygen 0 L 0 L    6 Minute Oxygen Saturation % 96 % 96 %    6 Minute Liters of Oxygen 0 L 0 L    2 Minute Post Oxygen Saturation % 96 % 96 %    2 Minute Post Liters of Oxygen 0 L -          Oxygen Initial Assessment:  Oxygen Initial Assessment - 09/03/19 1051      Home Oxygen   Home Oxygen Device None    Sleep Oxygen Prescription None  Home Exercise Oxygen Prescription None    Home Resting Oxygen Prescription None      Initial 6 min Walk   Oxygen Used None      Program Oxygen Prescription   Program Oxygen Prescription None      Intervention   Short Term Goals To learn and understand importance of monitoring SPO2 with pulse oximeter and demonstrate accurate use of the pulse oximeter.;To learn and understand importance of maintaining oxygen saturations>88%;To learn and demonstrate proper pursed lip breathing techniques or other breathing techniques.;To learn and demonstrate proper use of respiratory  medications    Long  Term Goals Verbalizes importance of monitoring SPO2 with pulse oximeter and return demonstration;Maintenance of O2 saturations>88%;Exhibits proper breathing techniques, such as pursed lip breathing or other method taught during program session;Compliance with respiratory medication;Demonstrates proper use of MDI's           Oxygen Re-Evaluation:  Oxygen Re-Evaluation    Row Name 09/14/19 1722 10/21/19 1723 11/30/19 1748 12/16/19 1727       Program Oxygen Prescription   Program Oxygen Prescription None None None None         Home Oxygen   Home Oxygen Device None None None None    Sleep Oxygen Prescription None None None None    Home Exercise Oxygen Prescription None None None None    Home Resting Oxygen Prescription None None None None         Goals/Expected Outcomes   Short Term Goals To learn and understand importance of monitoring SPO2 with pulse oximeter and demonstrate accurate use of the pulse oximeter.;To learn and understand importance of maintaining oxygen saturations>88%;To learn and demonstrate proper pursed lip breathing techniques or other breathing techniques.;To learn and demonstrate proper use of respiratory medications To learn and understand importance of maintaining oxygen saturations>88%;To learn and understand importance of monitoring SPO2 with pulse oximeter and demonstrate accurate use of the pulse oximeter. To learn and understand importance of maintaining oxygen saturations>88%;To learn and understand importance of monitoring SPO2 with pulse oximeter and demonstrate accurate use of the pulse oximeter. To learn and understand importance of maintaining oxygen saturations>88%;To learn and understand importance of monitoring SPO2 with pulse oximeter and demonstrate accurate use of the pulse oximeter.    Long  Term Goals Verbalizes importance of monitoring SPO2 with pulse oximeter and return demonstration;Maintenance of O2 saturations>88%;Exhibits  proper breathing techniques, such as pursed lip breathing or other method taught during program session;Compliance with respiratory medication;Demonstrates proper use of MDI's Verbalizes importance of monitoring SPO2 with pulse oximeter and return demonstration;Maintenance of O2 saturations>88% - Verbalizes importance of monitoring SPO2 with pulse oximeter and return demonstration;Maintenance of O2 saturations>88%    Comments Reviewed PLB technique with pt.  Talked about how it works and it's importance in maintaining their exercise saturations. She has a pulse oximeter to check her oxygen saturation at home. Informed her why it is important to have one. Reviewed that oxygen saturations should be 88 percent and above. She checks her oxygen at home if she is having breathing issues. She knows to be 88 percent and above when she is exercising. - She is still checking her O2 at home, sometimes she feels like it's hard to catch her breath - her O2 has been above 88.  Her doctor doubled her Gabapentin which she is not happy about. After getting her booster she feels she has had a bit of a set back.    Goals/Expected Outcomes Short: Become more profiecient at using PLB.  Long: Become independent at using PLB. Short: monitor oxygen at home with exertion. Long: maintain oxygen saturations above 88 percent independently. - Short: monitor oxygen at home with exertion. Long: maintain oxygen saturations above 88 percent independently.           Oxygen Discharge (Final Oxygen Re-Evaluation):  Oxygen Re-Evaluation - 12/16/19 1727      Program Oxygen Prescription   Program Oxygen Prescription None      Home Oxygen   Home Oxygen Device None    Sleep Oxygen Prescription None    Home Exercise Oxygen Prescription None    Home Resting Oxygen Prescription None      Goals/Expected Outcomes   Short Term Goals To learn and understand importance of maintaining oxygen saturations>88%;To learn and understand importance  of monitoring SPO2 with pulse oximeter and demonstrate accurate use of the pulse oximeter.    Long  Term Goals Verbalizes importance of monitoring SPO2 with pulse oximeter and return demonstration;Maintenance of O2 saturations>88%    Comments She is still checking her O2 at home, sometimes she feels like it's hard to catch her breath - her O2 has been above 88.  Her doctor doubled her Gabapentin which she is not happy about. After getting her booster she feels she has had a bit of a set back.    Goals/Expected Outcomes Short: monitor oxygen at home with exertion. Long: maintain oxygen saturations above 88 percent independently.           Initial Exercise Prescription:  Initial Exercise Prescription - 09/10/19 0900      Date of Initial Exercise RX and Referring Provider   Date 09/10/19    Referring Provider Marshell Garfinkel MD      Treadmill   MPH 2.2    Grade 1    Minutes 15    METs 2.99      REL-XR   Level 1    Speed 50    Minutes 15    METs 2      T5 Nustep   Level 2    SPM 80    Minutes 15    METs 2      Prescription Details   Frequency (times per week) 3    Duration Progress to 30 minutes of continuous aerobic without signs/symptoms of physical distress      Intensity   THRR 40-80% of Max Heartrate 135-161    Ratings of Perceived Exertion 11-13    Perceived Dyspnea 0-4      Progression   Progression Continue to progress workloads to maintain intensity without signs/symptoms of physical distress.      Resistance Training   Training Prescription Yes    Weight 3 lb    Reps 10-15           Perform Capillary Blood Glucose checks as needed.  Exercise Prescription Changes:  Exercise Prescription Changes    Row Name 09/10/19 0900 09/16/19 1000 09/21/19 1700 09/29/19 0800 10/14/19 1000     Response to Exercise   Blood Pressure (Admit) 118/56 104/72 - 122/70 126/74   Blood Pressure (Exercise) 144/74 120/70 - 130/72 128/72   Blood Pressure (Exit) 124/70 122/70  - 112/68 122/82   Heart Rate (Admit) 109 bpm 107 bpm - 103 bpm 102 bpm   Heart Rate (Exercise) 125 bpm 114 bpm - 116 bpm 115 bpm   Heart Rate (Exit) 114 bpm 100 bpm - 100 bpm 115 bpm   Oxygen Saturation (Admit) 97 % 97 % - 98 %  98 %   Oxygen Saturation (Exercise) 95 % 96 % - 97 % 96 %   Oxygen Saturation (Exit) 96 % 97 % - 97 % 95 %   Rating of Perceived Exertion (Exercise) 14 13 - 15 14   Perceived Dyspnea (Exercise) 2 2 - 2 2   Symptoms SOB, coughing SOB, coughing - - SOB   Comments walk test results first full day of exercise - - -   Duration - Progress to 30 minutes of  aerobic without signs/symptoms of physical distress Progress to 30 minutes of  aerobic without signs/symptoms of physical distress Progress to 30 minutes of  aerobic without signs/symptoms of physical distress Continue with 30 min of aerobic exercise without signs/symptoms of physical distress.   Intensity - THRR unchanged THRR unchanged THRR unchanged THRR unchanged     Progression   Progression - Continue to progress workloads to maintain intensity without signs/symptoms of physical distress. Continue to progress workloads to maintain intensity without signs/symptoms of physical distress. Continue to progress workloads to maintain intensity without signs/symptoms of physical distress. Continue to progress workloads to maintain intensity without signs/symptoms of physical distress.   Average METs - 2.32 2.32 2.96 3.9     Resistance Training   Training Prescription - Yes Yes Yes Yes   Weight - 3 lb 3 lb 3 lb 3 lb   Reps - 10-15 10-15 10-15 10-15     Interval Training   Interval Training - No No No No     Treadmill   MPH - 2.2 2.2 2.5 2.7   Grade - 0.5 0.5 2 2.5   Minutes - _0 METs - 2.84 2.84 3.6 4     REL-XR   Level - - - 2 4   Minutes - - - 15 15   METs - - - 2.6 3.8     T5 Nustep   Level - _1 -   Minutes - _2 -   METs - 1.8 1.8 1.9 -     Home Exercise Plan   Plans to continue  exercise at - - [facility] (comment)  Exercising 2 days/wk with [facility] PT study on off days of rehab [facility] (comment)  Exercising 2 days/wk with [facility] PT study on off days of rehab [facility] (comment)  Exercising 2 days/wk with [facility] PT study on off days of rehab   Frequency - - Add 2 additional days to program exercise sessions. Add 2 additional days to program exercise sessions. Add 2 additional days to program exercise sessions.   Initial Home Exercises Provided - - 09/21/19 09/21/19 09/21/19   Row Name 10/29/19 0900 11/11/19 1500 12/09/19 1000 12/22/19 1000       Response to Exercise   Blood Pressure (Admit) 110/72 114/74 110/62 120/82    Blood Pressure (Exercise) 142/60 122/70 118/68 130/70    Blood Pressure (Exit) 108/66 118/70 106/62 122/80    Heart Rate (Admit) 104 bpm 84 bpm 90 bpm 102 bpm    Heart Rate (Exercise) 117 bpm 116 bpm 122 bpm 112 bpm    Heart Rate (Exit) 112 bpm 110 bpm 100 bpm 77 bpm    Oxygen Saturation (Admit) 98 % 98 % 98 % 98 %    Oxygen Saturation (Exercise) 97 % 95 % 96 % 96 %    Oxygen Saturation (Exit) 97 % 98 % 97 % 96 %    Rating of Perceived Exertion (Exercise) 14  _0 Perceived Dyspnea (Exercise) 2 1 2.5 3    Symptoms - SOB SOB SOB    Duration Continue with 30 min of aerobic exercise without signs/symptoms of physical distress. Continue with 30 min of aerobic exercise without signs/symptoms of physical distress. Continue with 30 min of aerobic exercise without signs/symptoms of physical distress. Continue with 30 min of aerobic exercise without signs/symptoms of physical distress.    Intensity THRR unchanged THRR unchanged THRR unchanged THRR unchanged         Progression   Progression Continue to progress workloads to maintain intensity without signs/symptoms of physical distress. Continue to progress workloads to maintain intensity without signs/symptoms of physical distress. Continue to progress workloads to maintain  intensity without signs/symptoms of physical distress. Continue to progress workloads to maintain intensity without signs/symptoms of physical distress.    Average METs 4 3.3 3.1 3.3         Resistance Training   Training Prescription Yes Yes Yes Yes    Weight 6 lb 6 lb 6 lb 6 lb    Reps 10-15 10-15 10-15 10-15         Interval Training   Interval Training No No No No         Treadmill   MPH 2.7 2.7 2.7 2.5    Grade 2 2.5 2.5 2.5    Minutes _1 METs _2 REL-XR   Level 4 4 - 4    Minutes 15 15 - 15    METs 4 3.2 - 2.4         T5 Nustep   Level - 4 4 -    Minutes - 15 15 -    METs - 2 2.2 -         Home Exercise Plan   Plans to continue exercise at [facility] (comment)  Exercising 2 days/wk with [facility] PT study on off days of rehab [facility] (comment)  Exercising 2 days/wk with [facility] PT study on off days of rehab [facility] (comment)  Exercising 2 days/wk with [facility] PT study on off days of rehab    Frequency - Add 2 additional days to program exercise sessions. Add 2 additional days to program exercise sessions. Add 2 additional days to program exercise sessions.    Initial Home Exercises Provided - 09/21/19 09/21/19 09/21/19           Exercise Comments:  Exercise Comments    Row Name 01/14/20 1729           Exercise Comments Cassie [name] graduated today from  rehab with 36 sessions completed.  Details of the patient's exercise prescription and what She needs to do in order to continue the prescription and progress were discussed with patient.  Patient was given a copy of prescription and goals.  Patient verbalized understanding.  Cassie [name] plans to continue to exercise by continuing to exercise with the PT study at [facility].              Exercise Goals and Review:  Exercise Goals    Row Name 09/10/19 0916             Exercise Goals   Increase Physical Activity Yes       Intervention Provide advice, education,  support and counseling about physical activity/exercise needs.;Develop an individualized exercise prescription for aerobic and resistive training based on  initial evaluation findings, risk stratification, comorbidities and participant's personal goals.       Expected Outcomes Short Term: Attend rehab on a regular basis to increase amount of physical activity.;Long Term: Add in home exercise to make exercise part of routine and to increase amount of physical activity.;Long Term: Exercising regularly at least 3-5 days a week.       Increase Strength and Stamina Yes       Intervention Provide advice, education, support and counseling about physical activity/exercise needs.;Develop an individualized exercise prescription for aerobic and resistive training based on initial evaluation findings, risk stratification, comorbidities and participant's personal goals.       Expected Outcomes Short Term: Increase workloads from initial exercise prescription for resistance, speed, and METs.;Short Term: Perform resistance training exercises routinely during rehab and add in resistance training at home;Long Term: Improve cardiorespiratory fitness, muscular endurance and strength as measured by increased METs and functional capacity (6MWT)       Able to understand and use rate of perceived exertion (RPE) scale Yes       Intervention Provide education and explanation on how to use RPE scale       Expected Outcomes Short Term: Able to use RPE daily in rehab to express subjective intensity level;Long Term:  Able to use RPE to guide intensity level when exercising independently       Able to understand and use Dyspnea scale Yes       Intervention Provide education and explanation on how to use Dyspnea scale       Expected Outcomes Short Term: Able to use Dyspnea scale daily in rehab to express subjective sense of shortness of breath during exertion;Long Term: Able to use Dyspnea scale to guide intensity level when exercising  independently       Knowledge and understanding of Target Heart Rate Range (THRR) Yes       Intervention Provide education and explanation of THRR including how the numbers were predicted and where they are located for reference       Expected Outcomes Short Term: Able to state/look up THRR;Short Term: Able to use daily as guideline for intensity in rehab;Long Term: Able to use THRR to govern intensity when exercising independently       Able to check pulse independently Yes       Intervention Provide education and demonstration on how to check pulse in carotid and radial arteries.;Review the importance of being able to check your own pulse for safety during independent exercise       Expected Outcomes Short Term: Able to explain why pulse checking is important during independent exercise;Long Term: Able to check pulse independently and accurately       Understanding of Exercise Prescription Yes       Intervention Provide education, explanation, and written materials on patient's individual exercise prescription       Expected Outcomes Short Term: Able to explain program exercise prescription;Long Term: Able to explain home exercise prescription to exercise independently              Exercise Goals Re-Evaluation :  Exercise Goals Re-Evaluation    Row Name 09/14/19 1720 09/21/19 1737 09/29/19 0851 10/14/19 1011 10/21/19 1734     Exercise Goal Re-Evaluation   Exercise Goals Review Increase Physical Activity;Able to understand and use rate of perceived exertion (RPE) scale;Knowledge and understanding of Target Heart Rate Range (THRR);Understanding of Exercise Prescription;Increase Strength and Stamina;Able to understand and use Dyspnea scale;Able to check pulse independently  Increase Physical Activity;Able to understand and use rate of perceived exertion (RPE) scale;Knowledge and understanding of Target Heart Rate Range (THRR);Understanding of Exercise Prescription;Increase Strength and Stamina;Able  to understand and use Dyspnea scale;Able to check pulse independently Increase Physical Activity;Able to understand and use rate of perceived exertion (RPE) scale;Knowledge and understanding of Target Heart Rate Range (THRR);Understanding of Exercise Prescription;Increase Strength and Stamina;Able to understand and use Dyspnea scale;Able to check pulse independently Increase Physical Activity;Increase Strength and Stamina;Understanding of Exercise Prescription Increase Physical Activity;Increase Strength and Stamina   Comments Reviewed RPE and dyspnea scales, THR and program prescription with pt today.  Pt voiced understanding and was given a copy of goals to take home. Reviewed home exercise with pt today.  Pt plans to exercise 2 days a week with [facility] study of off days of rehab for exercise.  Reviewed THR, pulse, RPE, sign and symptoms, pulse oximetery and when to call 911 or MD.  Also discussed weather considerations and indoor options.  Pt voiced understanding. Cassie [name] is doing well in rehab. She has already increased her treadmill speed to 2.5 and incline to 2%. She is adjusting well to the exercise machines. O2 sats are maintained well. Will continue to monitor progress. Cassie [name] has continued to do well in rehab.  She now on level 4 for the XR and up to 2.7 mph on the treadmill.  We will continue to monitor her progress. Cassie [name] is going to start another PT program study for COVID 19. Her classes start in November so she will go there on her off days from [place].   Expected Outcomes Short: Use RPE daily to regulate intensity. Long: Follow program prescription in THR. Short: Continue to attend pulmonary rehab with consistent attendence and [facility] study on off days of rehab. Long: Improve SOB and be able to maintain an idependent exercise program following Pulmonary Rehab. Short: Continue to increase workloads Long: Progress overall MET level/ increase strength and stamina Short: Try 4 lb hand weights  Long:  Continue to improve stamina Short: Start PT at [place]. Long: Maintain exercise at [facility] and PT at rehab.   Cassie [name] Name 10/29/19 9371 11/11/19 1525 11/30/19 1733 12/09/19 1007 12/16/19 1722     Exercise Goal Re-Evaluation   Exercise Goals Review Increase Physical Activity;Increase Strength and Stamina Increase Physical Activity;Increase Strength and Stamina;Understanding of Exercise Prescription Increase Physical Activity;Increase Strength and Stamina;Understanding of Exercise Prescription Increase Physical Activity;Increase Strength and Stamina;Understanding of Exercise Prescription Increase Physical Activity;Increase Strength and Stamina;Understanding of Exercise Prescription   Comments Cassie [name] is progressing well and averaging 4 METS and oxygen levels stay in high 90s.  She uses 6 lb weights for strength work.  Staff will monitor progress. Cassie [name] continues to do well in rehab.  She is up to level 4 on the T5 NuStep and 2.5% grade on the treadmill.  We will continue to monitor his progress. Today is Cassie [name]'s first day back after missing 2 weeks.  She is going slower than ususal on T5 and wil adjust TM as needed. Cassie [name] returned on 11/29 and has been doing well.  She is getting back slowly.  We will continue to monitor her progress. She is still doing PT 2x/week - on break right now. After rehab she will be going to [facility] facilities.   Expected Outcomes Short:  continue to exercise consistently Long:  build stamina and increase MET level Short: Continue to maintain 2.5% grade on treadmill  Long; Continue to improve stamina. Short: get back to conssistent  exercise Long: get back to previous level Short: Continue to attend consistently again Long; Continue to improe stamina. Short: Continue to attend consistently, continue with PT Long: Continue to improve stamina.   Cassie [name] Name 12/22/19 1003 01/05/20 1622 01/13/20 1726         Exercise Goal Re-Evaluation   Exercise Goals Review Increase Physical Activity;Increase  Strength and Stamina - Increase Physical Activity;Increase Strength and Stamina     Comments Cassie [name] improved her post 6MWT by 28%!  She will complete HT soon and plans to exercise at [facility] facility. 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     Expected Outcomes Short: complete LW program Long:  maintain exercise on her own - Short: complete LW program Long:  maintain exercise on her own            Discharge Exercise Prescription (Final Exercise Prescription Changes):  Exercise Prescription Changes - 12/22/19 1000      Response to Exercise   Blood Pressure (Admit) 120/82    Blood Pressure (Exercise) 130/70    Blood Pressure (Exit) 122/80    Heart Rate (Admit) 102 bpm    Heart Rate (Exercise) 112 bpm    Heart Rate (Exit) 77 bpm    Oxygen Saturation (Admit) 98 %    Oxygen Saturation (Exercise) 96 %    Oxygen Saturation (Exit) 96 %    Rating of Perceived Exertion (Exercise) 14    Perceived Dyspnea (Exercise) 3    Symptoms SOB    Duration Continue with 30 min of aerobic exercise without signs/symptoms of physical distress.    Intensity THRR unchanged      Progression   Progression Continue to progress workloads to maintain intensity without signs/symptoms of physical distress.    Average METs 3.3      Resistance Training   Training Prescription Yes    Weight 6 lb    Reps 10-15      Interval Training   Interval Training No      Treadmill   MPH 2.5    Grade 2.5    Minutes 15    METs 4      REL-XR   Level 4    Minutes 15    METs 2.4      Home Exercise Plan   Plans to continue exercise at [facility] (comment)   Exercising 2 days/wk with [facility] PT study on off days of rehab   Frequency Add 2 additional days to program exercise sessions.    Initial Home Exercises Provided 09/21/19           Nutrition:  Target Goals: Understanding of nutrition guidelines, daily intake of sodium <1579m, cholesterol <2071m  calories 30% from fat and 7% or less from saturated fats, daily to have 5 or more servings of fruits and vegetables.  Education: All About Nutrition: -Group instruction provided by verbal, written material, interactive activities, discussions, models, and posters to present general guidelines for heart healthy nutrition including fat, fiber, MyPlate, the role of sodium in heart healthy nutrition, utilization of the nutrition label, and utilization of this knowledge for meal planning. Follow up email sent as well. Written material given at graduation.   Biometrics:  Pre Biometrics - 09/10/19 0918      Pre Biometrics   Height 5' 5.6" (1.666 m)    Weight 206 lb 4.8 oz (93.6 kg)    BMI (Calculated) 33.71    Single Leg Stand  14 seconds           Post Biometrics - 12/17/19 1757       Post  Biometrics   Single Leg Stand 22.03 seconds           Nutrition Therapy Plan and Nutrition Goals:  Nutrition Therapy & Goals - 09/22/19 1210      Nutrition Therapy   Diet heart healthy, low Na, Pulmonary MNT    Protein (specify units) 75g    Fiber 25 grams    Whole Grain Foods 3 servings    Saturated Fats 12 max. grams    Fruits and Vegetables 5 servings/day    Sodium 1.5 grams      Personal Nutrition Goals   Nutrition Goal ST: Power bowls and buddhas LT: improve ADL, improving fitness, improve energy (changes on time of day, highs of 5/10)    Comments B: cereal or herberlife shake L: sandwich and fruit  - white bread S: granola bar or goldfish D: hello fresh meal, breakfast for dinner, or takeout. Feels SOB when eating and for a while after. Drinks: herbalife tea and water - drinks all day. Discussed heart healthy eating and meal planning. discussed pulmonary MNT.      Intervention Plan   Intervention Prescribe, educate and counsel regarding individualized specific dietary modifications aiming towards targeted core components such as weight, hypertension, lipid management, diabetes, heart  failure and other comorbidities.;Nutrition handout(s) given to patient.    Expected Outcomes Short Term Goal: Understand basic principles of dietary content, such as calories, fat, sodium, cholesterol and nutrients.;Short Term Goal: A plan has been developed with personal nutrition goals set during dietitian appointment.;Long Term Goal: Adherence to prescribed nutrition plan.           Nutrition Assessments:  Nutrition Assessments - 09/03/19 1050      MEDFICTS Scores   Pre Score 21          MEDIFICTS Score Key:  ?70 Need to make dietary changes   40-70 Heart Healthy Diet  ? 40 Therapeutic Level Cholesterol Diet   Picture Your Plate Scores:  <40 Unhealthy dietary pattern with much room for improvement.  41-50 Dietary pattern unlikely to meet recommendations for good health and room for improvement.  51-60 More healthful dietary pattern, with some room for improvement.   >60 Healthy dietary pattern, although there may be some specific behaviors that could be improved.   Nutrition Goals Re-Evaluation:  Nutrition Goals Re-Evaluation    Row Name 10/28/19 1728 11/30/19 1733 12/16/19 1738 01/13/20 1730       Goals   Nutrition Goal ST: Power bowls and buddha bowls for lunch, try whole wheat bread LT: Whole wheat bread 1/2 the time, eat 8 servings of fruits and vegetables, meet protein and calorie needs - ST: drink water with exercise LT: continue with heart healthy eating -    Comment She reports trying to eat healthy at lunch - Ex: leftover chicken, sandwich with cucumbers (white wheat). She also is drining more water. She reports no other changes at this time. Lian is doing Sales executive for dinners.  She is still working on incorporating more fruits and vegetables.  She is drinking more water. Tamikia is still doing Sales executive for dinners- she reports it takes the decision making out of dinner. breakfast: oatmeal or shake. Lunch: variety - soup. She reports getting brownies as a  present.  She reports not feeling like she is not drinking enough water and would like to work  on that. Loyd is drinking more water now.  Still using Hello Fresh    Expected Outcome ST: Power bowls and buddha bowls for lunch, try whole wheat bread LT: Whole wheat bread 1/2 the time, eat 8 servings of fruits and vegetables, meet protein and calorie needs Short: continue to work on getting more fruits and veggies Long:  get full 8 servings of fruits and veggies ST: drink water with exercise LT: continue with heart healthy eating -           Nutrition Goals Discharge (Final Nutrition Goals Re-Evaluation):  Nutrition Goals Re-Evaluation - 01/13/20 1730      Goals   Comment Cassie [name] is drinking more water now.  Still using Hello Fresh           Psychosocial: Target Goals: Acknowledge presence or absence of significant depression and/or stress, maximize coping skills, provide positive support system. Participant is able to verbalize types and ability to use techniques and skills needed for reducing stress and depression.   Education: Stress, Anxiety, and Depression - Group verbal and visual presentation to define topics covered.  Reviews how body is impacted by stress, anxiety, and depression.  Also discusses healthy ways to reduce stress and to treat/manage anxiety and depression.  Written material given at graduation.   Education: Sleep Hygiene -Provides group verbal and written instruction about how sleep can affect your health.  Define sleep hygiene, discuss sleep cycles and impact of sleep habits. Review good sleep hygiene tips.    Initial Review & Psychosocial Screening:  Initial Psych Review & Screening - 09/03/19 1043      Initial Review   Current issues with Current Anxiety/Panic;Current Stress Concerns;Current Sleep Concerns    Source of Stress Concerns Chronic Illness;Family;Unable to participate in former interests or hobbies;Unable to perform yard/household activities     Comments Frustration form not being able to go and do from dealing with COVID recovery over last year, meds help with sleep and cough, mother just diagnosised with breast cancer this month      Cassie [name]? Yes   husband, mother and father, kids (daughter just moved to [place])     Barriers   Psychosocial barriers to participate in program There are no identifiable barriers or psychosocial needs.;The patient should benefit from training in stress management and relaxation.;Psychosocial barriers identified (see note)      Screening Interventions   Interventions Encouraged to exercise;Provide feedback about the scores to participant;To provide support and resources with identified psychosocial needs    Expected Outcomes Short Term goal: Utilizing psychosocial counselor, staff and physician to assist with identification of specific Stressors or current issues interfering with healing process. Setting desired goal for each stressor or current issue identified.;Long Term Goal: Stressors or current issues are controlled or eliminated.;Short Term goal: Identification and review with participant of any Quality of Life or Depression concerns found by scoring the questionnaire.;Long Term goal: The participant improves quality of Life and PHQ9 Scores as seen by post scores and/or verbalization of changes           Quality of Life Scores:  Scores of 19 and below usually indicate a poorer quality of life in these areas.  A difference of  2-3 points is a clinically meaningful difference.  A difference of 2-3 points in the total score of the Quality of Life Index has been associated with significant improvement in overall quality of life, self-image, physical symptoms, and general health in studies assessing  change in quality of life.  PHQ-9: Recent Review Flowsheet Data    Depression screen [facility] 2/9 09/10/2019 01/05/2019 12/20/2017   Decreased Interest 1 0 2   Down, Depressed,  Hopeless 0 0 1   PHQ - 2 Score 1 0 3   Altered sleeping 1 - 2   Tired, decreased energy 2 - 2   Change in appetite 0 - 0   Feeling bad or failure about yourself  0 - 0   Trouble concentrating 0 - 0   Moving slowly or fidgety/restless 0 - 0   Suicidal thoughts 0 - 0   PHQ-9 Score 4 - 7   Difficult doing work/chores Somewhat difficult - -     Interpretation of Total Score  Total Score Depression Severity:  1-4 = Minimal depression, 5-9 = Mild depression, 10-14 = Moderate depression, 15-19 = Moderately severe depression, 20-27 = Severe depression   Psychosocial Evaluation and Intervention:  Psychosocial Evaluation - 09/03/19 1052      Psychosocial Evaluation & Interventions   Interventions Stress management education;Encouraged to exercise with the program and follow exercise prescription    Comments 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  She was diganoised a year ago and is still recovering.  She still is having extreme SOB and hoarseness, and extreme fatigue.  She wants to be able to go into work and get out of the house to be able to do more and breathe better.  She is originally from [place] and went to [place] for school. Her husband works for school and her family is now in [place], [place].  She continues to struggle with some brain fog but it is starting to clear.  This has all really been frustrating for her as she is used to being able to just go and do.  She is hoping to be able to come to class after her work day so that she can continue to work as much as possible.  Her family has also just found out that her mom has breast cancer.  She is looking forward to feeling better.    Expected Outcomes Short: Attend rehab to improve stamina and SOB  Long: Continue to stay positive and    Continue Psychosocial Services  Follow up required by staff           Psychosocial Re-Evaluation:  Psychosocial Re-Evaluation    [place] Name 10/21/19 1729 12/16/19 1733 01/13/20  1727         Psychosocial Re-Evaluation   Current issues with Current Stress Concerns Current Stress Concerns -     Comments She is stressed that she is not back to normal since she has had COVID. Amonie is able to keep coming to rehab and it is improving her mood. Her job has been patient with her and she is working full time at home. She reports stress and frustration with her progress. SHe is also stressed about her job and how long they will be flexible. She crafts to relieve stress. She reports her family is a good support system for her - especially her husband, daughter, and parents. Her parents kept her active when she was first recovering and her husband was at work. Cassie [name] says she has some stress from being back at work and not being able to work fully and not travel or go in.  She still does crafts to relieve stress.     Expected Outcomes Short: Continue to exercise regularly to support  mental health and notify staff of any changes. Long: maintain mental health and well being through teaching of rehab or prescribed medications independently. Short: Continue to exercise regularly and craft to support mental health  Long: maintain mental health by using support system, continue exercise, and crafting Short: continue to exercise and craft Long:  maintain positiv e outlook     Interventions Encouraged to attend Pulmonary Rehabilitation for the exercise Encouraged to attend Pulmonary Rehabilitation for the exercise -     Continue Psychosocial Services  Follow up required by staff - -     Comments - Frustration form not being able to go and do from dealing with COVID recovery over last year is her biggest stressor. -           Initial Review   Source of Stress Concerns - Chronic Illness;Family;Unable to participate in former interests or hobbies;Unable to perform yard/household activities -            Psychosocial Discharge (Final Psychosocial Re-Evaluation):  Psychosocial Re-Evaluation -  01/13/20 1727      Psychosocial Re-Evaluation   Comments Cassie [name] says she has some stress from being back at work and not being able to work fully and not travel or go in.  She still does crafts to relieve stress.    Expected Outcomes Short: continue to exercise and craft Long:  maintain positiv e outlook           Education: Education Goals: Education classes will be provided on a weekly basis, covering required topics. Participant will state understanding/return demonstration of topics presented.  Learning Barriers/Preferences:  Learning Barriers/Preferences - 09/03/19 1042      Learning Barriers/Preferences   Learning Barriers Sight   glasses to drive   Learning Preferences Skilled Demonstration           General Pulmonary Education Topics:  Infection Prevention: - Provides verbal and written material to individual with discussion of infection control including proper hand washing and proper equipment cleaning during exercise session. Flowsheet Row Pulmonary Rehab from 09/30/2019 in [facility] Cardiac and Pulmonary Rehab  Date 09/10/19  Educator [facility]  Instruction Review Code 1- Verbalizes Understanding      Falls Prevention: - Provides verbal and written material to individual with discussion of falls prevention and safety. Flowsheet Row Pulmonary Rehab from 09/30/2019 in [facility] Cardiac and Pulmonary Rehab  Date 09/10/19  Educator [facility]  Instruction Review Code 1- Verbalizes Understanding      Chronic Lung Disease Review: - Group verbal instruction with posters, models, PowerPoint presentations and videos,  to review new updates, new respiratory medications, new advancements in procedures and treatments. Providing information on websites and "800" numbers for continued self-education. Includes information about supplement oxygen, available portable oxygen systems, continuous and intermittent flow rates, oxygen safety, concentrators, and Medicare reimbursement for oxygen. Explanation of  Pulmonary Drugs, including class, frequency, complications, importance of spacers, rinsing mouth after steroid MDI's, and proper cleaning methods for nebulizers. Review of basic lung anatomy and physiology related to function, structure, and complications of lung disease. Review of risk factors. Discussion about methods for diagnosing sleep apnea and types of masks and machines for OSA. Includes a review of the use of types of environmental controls: home humidity, furnaces, filters, dust mite/pet prevention, HEPA vacuums. Discussion about weather changes, air quality and the benefits of nasal washing. Instruction on Warning signs, infection symptoms, calling MD promptly, preventive modes, and value of vaccinations. Review of effective airway clearance, coughing and/or vibration techniques. Emphasizing that all  should Create an Sports administrator. Written material given at graduation. Flowsheet Row Pulmonary Rehab from 09/30/2019 in [facility] Cardiac and Pulmonary Rehab  Date 09/10/19  Instruction Review Code 3- Needs Reinforcement  [need identified]      AED/CPR: - Group verbal and written instruction with the use of models to demonstrate the basic use of the AED with the basic ABC's of resuscitation.    Anatomy and Cardiac Procedures: - Group verbal and visual presentation and models provide information about basic cardiac anatomy and function. Reviews the testing methods done to diagnose heart disease and the outcomes of the test results. Describes the treatment choices: Medical Management, Angioplasty, or Coronary Bypass Surgery for treating various heart conditions including Myocardial Infarction, Angina, Valve Disease, and Cardiac Arrhythmias.  Written material given at graduation.   Medication Safety: - Group verbal and visual instruction to review commonly prescribed medications for heart and lung disease. Reviews the medication, class of the drug, and side effects. Includes the steps to properly store  meds and maintain the prescription regimen.  Written material given at graduation. Flowsheet Row Pulmonary Rehab from 09/30/2019 in [facility] Cardiac and Pulmonary Rehab  Date 09/16/19  Educator [facility]  Instruction Review Code 1- Verbalizes Understanding      Other: -Provides group and verbal instruction on various topics (see comments)   Knowledge Questionnaire Score:    Core Components/Risk Factors/Patient Goals at Admission:  Personal Goals and Risk Factors at Admission - 09/10/19 0918      Core Components/Risk Factors/Patient Goals on Admission    Weight Management Yes;Obesity;Weight Loss    Intervention Weight Management: Develop a combined nutrition and exercise program designed to reach desired caloric intake, while maintaining appropriate intake of nutrient and fiber, sodium and fats, and appropriate energy expenditure required for the weight goal.;Weight Management: Provide education and appropriate resources to help participant work on and attain dietary goals.;Weight Management/Obesity: Establish reasonable short term and long term weight goals.;Obesity: Provide education and appropriate resources to help participant work on and attain dietary goals.    Admit Weight 206 lb 4.8 oz (93.6 kg)    Goal Weight: Short Term 200 lb (90.7 kg)    Goal Weight: Long Term 195 lb (88.5 kg)    Expected Outcomes Short Term: Continue to assess and modify interventions until short term weight is achieved;Long Term: Adherence to nutrition and physical activity/exercise program aimed toward attainment of established weight goal;Weight Loss: Understanding of general recommendations for a balanced deficit meal plan, which promotes 1-2 lb weight loss per week and includes a negative energy balance of [phone] kcal/d;Understanding recommendations for meals to include 15-35% energy as protein, 25-35% energy from fat, 35-60% energy from carbohydrates, less than 269m of dietary cholesterol, 20-35 gm of total fiber  daily;Understanding of distribution of calorie intake throughout the day with the consumption of 4-5 meals/snacks    Improve shortness of breath with ADL's Yes    Intervention Provide education, individualized exercise plan and daily activity instruction to help decrease symptoms of SOB with activities of daily living.    Expected Outcomes Short Term: Improve cardiorespiratory fitness to achieve a reduction of symptoms when performing ADLs;Long Term: Be able to perform more ADLs without symptoms or delay the onset of symptoms           Education:Diabetes - Individual verbal and written instruction to review signs/symptoms of diabetes, desired ranges of glucose level fasting, after meals and with exercise. Acknowledge that pre and post exercise glucose checks will be done for 3  sessions at entry of program.   Know Your Numbers and Heart Failure: - Group verbal and visual instruction to discuss disease risk factors for cardiac and pulmonary disease and treatment options.  Reviews associated critical values for Overweight/Obesity, Hypertension, Cholesterol, and Diabetes.  Discusses basics of heart failure: signs/symptoms and treatments.  Introduces Heart Failure Zone chart for action plan for heart failure.  Written material given at graduation.   Core Components/Risk Factors/Patient Goals Review:   Goals and Risk Factor Review    Row Name 10/21/19 1726 11/30/19 1730 12/16/19 1743 01/13/20 1724       Core Components/Risk Factors/Patient Goals Review   Personal Goals Review Improve shortness of breath with ADL's Improve shortness of breath with ADL's Improve shortness of breath with ADL's Improve shortness of breath with ADL's    Review She is able to do some chores without being short of breath. When she vaccuums is when she gets a little short of breath.Spoke to patient about their shortness of breath and what they can do to improve. Patient has been informed of breathing techniques when  starting the program. Patient is informed to tell staff if they have had any med changes and that certain meds they are taking or not taking can be causing shortness of breath. Kryssa was feeling better before getting the Covid vaccine.  She feels she's had a major setback.  She is back on albuterol and just finished prednisone. Tashe was feeling better before getting the Covid vaccine, however, she feels she's had a major setback since your first vaccine, but feels she is improving.  She continues to exercise and practice PLB. Sadonna feels she has gained back some of her stamina.  She was not able to walk to the end of her driveway without getting out of breath.  She was glad to get out of the house and get moving again.    Expected Outcomes Short: Attend [facility] regularly to improve shortness of breath with ADL's. Long: maintain independence with ADL's Short: get back to regular exercise Long: work up to previous levels Short: get back to regular exercise once PT break is over Long: work up to previous levels Short: complete LW Long: maintain exercise on her own           Core Components/Risk Factors/Patient Goals at Discharge (Final Review):   Goals and Risk Factor Review - 01/13/20 1724      Core Components/Risk Factors/Patient Goals Review   Personal Goals Review Improve shortness of breath with ADL's    Review Cassie [name] feels she has gained back some of her stamina.  She was not able to walk to the end of her driveway without getting out of breath.  She was glad to get out of the house and get moving again.    Expected Outcomes Short: complete LW Long: maintain exercise on her own           ITP Comments:  ITP Comments    Row Name 09/03/19 1059 09/10/19 0913 09/14/19 1718 09/22/19 1232 10/07/19 0555   ITP Comments Completed virtual orientation today.  EP evaluation is scheduled for Thurs 9/9 at 730am.  Documentation for diagnosis can be found in [facility] encounter 08/21/19. Completed 6MWT and gym  orientation. Initial ITP created and sent for review to Dr. Emily Filbert, Medical Director. First full day of exercise!  Patient was oriented to gym and equipment including functions, settings, policies, and procedures.  Patient's individual exercise prescription and treatment plan were reviewed.  All  starting workloads were established based on the results of the 6 minute walk test done at initial orientation visit.  The plan for exercise progression was also introduced and progression will be customized based on patient's performance and goals. Completed Initial RD Evaluation 30 Day review completed. Medical Director ITP review done, changes made as directed, and signed approval by Medical Director.   Row Name 11/04/19 0653 12/02/19 1017 12/30/19 0603 01/05/20 1621 01/14/20 1729   ITP Comments 30 Day review completed. Medical Director ITP review done, changes made as directed, and signed approval by Medical Director. 30 Day review completed. Medical Director ITP review done, changes made as directed, and signed approval by Medical Director. 30 Day review completed. Medical Director ITP review done, changes made as directed, and signed approval by Medical Director. Jahni has been out with family for the holidays.  She is supposed to return this week. Chrisette graduated today from  rehab with 36 sessions completed.  Details of the patient's exercise prescription and what She needs to do in order to continue the prescription and progress were discussed with patient.  Patient was given a copy of prescription and goals.  Patient verbalized understanding.  Cassie [name] plans to continue to exercise by continuing to exercise with the PT study at [facility].          Comments: Discharge ITP

## 2020-01-14 NOTE — Progress Notes (Signed)
Discharge Progress Report  Patient Details  Name: Cassie [name] MRN: [id number] Date of Birth: 11/01/1970 Referring Provider:   Flowsheet Row Pulmonary Rehab from 09/10/2019 in [facility] Card[facility]  Referring Provider Marshell Garfinkel MD       Number of Visits: 36  Reason for Discharge:  Patient reached a stable level of exercise. Patient independent in their exercise.  Smoking History:  Social History   Tobacco Use  Smoking Status Former Smoker  . Packs/day: 0.10  . Years: 5.00  . Pack years: 0.50  . Types: Cigarettes  . Quit date: 02/21/2007  . Years since quitting: 12.9  Smokeless Tobacco Never Used  Tobacco Comment   social smoker    Diagnosis:  DOE (dyspnea on exertion)  ADL UCSD:  Pulmonary Assessment Scores    Row Name 09/03/19 1051 09/10/19 1024 12/17/19 1757     ADL UCSD   ADL Phase Entry Entry --   SOB Score total 57 57 --   Rest 1 1 --   Walk 2 2 --   Stairs 4 4 --   Bath 2 2 --   Dress 2 2 --   Shop 3 3 --     CAT Score   CAT Score 25 25 --     mMRC Score   mMRC Score -- 3 2   Row Name 01/14/20 1743         ADL UCSD   SOB Score total 47     Rest 1     Walk 2     Stairs 5     Bath 2     Dress 1     Shop 2           CAT Score   CAT Score 20            Initial Exercise Prescription:  Initial Exercise Prescription - 09/10/19 0900      Date of Initial Exercise RX and Referring Provider   Date 09/10/19    Referring Provider Marshell Garfinkel MD      Treadmill   MPH 2.2    Grade 1    Minutes 15    METs 2.99      REL-XR   Level 1    Speed 50    Minutes 15    METs 2      T5 Nustep   Level 2    SPM 80    Minutes 15    METs 2      Prescription Details   Frequency (times per week) 3    Duration Progress to 30 minutes of continuous aerobic without signs/symptoms of physical distress      Intensity   THRR 40-80% of Max Heartrate 135-161    Ratings of Perceived Exertion 11-13    Perceived Dyspnea 0-4       Progression   Progression Continue to progress workloads to maintain intensity without signs/symptoms of physical distress.      Resistance Training   Training Prescription Yes    Weight 3 lb    Reps 10-15           Discharge Exercise Prescription (Final Exercise Prescription Changes):  Exercise Prescription Changes - 12/22/19 1000      Response to Exercise   Blood Pressure (Admit) 120/82    Blood Pressure (Exercise) 130/70    Blood Pressure (Exit) 122/80    Heart Rate (Admit) 102 bpm    Heart Rate (Exercise) 112 bpm  Heart Rate (Exit) 77 bpm    Oxygen Saturation (Admit) 98 %    Oxygen Saturation (Exercise) 96 %    Oxygen Saturation (Exit) 96 %    Rating of Perceived Exertion (Exercise) 14    Perceived Dyspnea (Exercise) 3    Symptoms SOB    Duration Continue with 30 min of aerobic exercise without signs/symptoms of physical distress.    Intensity THRR unchanged      Progression   Progression Continue to progress workloads to maintain intensity without signs/symptoms of physical distress.    Average METs 3.3      Resistance Training   Training Prescription Yes    Weight 6 lb    Reps 10-15      Interval Training   Interval Training No      Treadmill   MPH 2.5    Grade 2.5    Minutes 15    METs 4      REL-XR   Level 4    Minutes 15    METs 2.4      Home Exercise Plan   Plans to continue exercise at [employer] (comment)   Exercising 2 days/wk with [facility] PT study on off days of rehab   Frequency Add 2 additional days to program exercise sessions.    Initial Home Exercises Provided 09/21/19           Functional Capacity:  6 Minute Walk    Row Name 09/10/19 0913 12/17/19 1746       6 Minute Walk   Phase Initial Discharge    Distance 1163 feet 1490 feet    Distance % Change -- 28.1 %    Distance Feet Change -- 325 ft    Walk Time 6 minutes 6 minutes    # of Rest Breaks 0 0    MPH 2.2 2.82    METS 3.96 4.44    RPE 14 15    Perceived  Dyspnea  2 2.5    VO2 Peak 13.86 15.55    Symptoms Yes (comment) Yes (comment)    Comments SOB, coughing coughing    Resting HR 109 bpm 118 bpm    Resting BP 118/56 110/60    Resting Oxygen Saturation  97 % 98 %    Exercise Oxygen Saturation  during 6 min walk 95 % 95 %    Max Ex. HR 125 bpm 132 bpm    Max Ex. BP 144/74 130/80    2 Minute Post BP 132/70 124/78         Interval HR   1 Minute HR 123 121    2 Minute HR 120 132    3 Minute HR 118 129    4 Minute HR 116 126    5 Minute HR 118 126    6 Minute HR 125 129    2 Minute Post HR 111 122    Interval Heart Rate? Yes Yes         Interval Oxygen   Interval Oxygen? Yes Yes    Baseline Oxygen Saturation % 97 % 98 %    1 Minute Oxygen Saturation % 96 % 95 %    1 Minute Liters of Oxygen 0 L  Room Air 0 L    2 Minute Oxygen Saturation % 95 % 96 %    2 Minute Liters of Oxygen 0 L 0 L    3 Minute Oxygen Saturation % 95 % 96 %    3 Minute  Liters of Oxygen 0 L 0 L    4 Minute Oxygen Saturation % 96 % 96 %    4 Minute Liters of Oxygen 0 L 0 L    5 Minute Oxygen Saturation % 96 % 96 %    5 Minute Liters of Oxygen 0 L 0 L    6 Minute Oxygen Saturation % 96 % 96 %    6 Minute Liters of Oxygen 0 L 0 L    2 Minute Post Oxygen Saturation % 96 % 96 %    2 Minute Post Liters of Oxygen 0 L --            Nutrition & Weight - Outcomes:  Pre Biometrics - 09/10/19 0918      Pre Biometrics   Height 5' 5.6" (1.666 m)    Weight 206 lb 4.8 oz (93.6 kg)    BMI (Calculated) 33.71    Single Leg Stand 14 seconds           Post Biometrics - 12/17/19 1757       Post  Biometrics   Single Leg Stand 22.03 seconds           Nutrition:  Nutrition Therapy & Goals - 09/22/19 1210      Nutrition Therapy   Diet heart healthy, low Na, Pulmonary MNT    Protein (specify units) 75g    Fiber 25 grams    Whole Grain Foods 3 servings    Saturated Fats 12 max. grams    Fruits and Vegetables 5 servings/day    Sodium 1.5 grams       Personal Nutrition Goals   Nutrition Goal ST: Power bowls and buddhas LT: improve ADL, improving fitness, improve energy (changes on time of day, highs of 5/10)    Comments B: cereal or herberlife shake L: sandwich and fruit  - white bread S: granola bar or goldfish D: hello fresh meal, breakfast for dinner, or takeout. Feels SOB when eating and for a while after. Drinks: herbalife tea and water - drinks all day. Discussed heart healthy eating and meal planning. discussed pulmonary MNT.      Intervention Plan   Intervention Prescribe, educate and counsel regarding individualized specific dietary modifications aiming towards targeted core components such as weight, hypertension, lipid management, diabetes, heart failure and other comorbidities.;Nutrition handout(s) given to patient.    Expected Outcomes Short Term Goal: Understand basic principles of dietary content, such as calories, fat, sodium, cholesterol and nutrients.;Short Term Goal: A plan has been developed with personal nutrition goals set during dietitian appointment.;Long Term Goal: Adherence to prescribed nutrition plan.           Nutrition Discharge:  Nutrition Assessments - 01/14/20 1745      Rate Your Plate Scores   Post Score --      MEDFICTS Scores   Post Score 36           Goals reviewed with patient; copy given to patient.

## 2020-01-15 NOTE — Telephone Encounter (Signed)
Discuss refills at next ov to determine longterm treatment options

## 2020-01-29 ENCOUNTER — Ambulatory Visit: Payer: BC Managed Care – PPO | Admitting: Pulmonary Disease

## 2020-01-29 ENCOUNTER — Ambulatory Visit (INDEPENDENT_AMBULATORY_CARE_PROVIDER_SITE_OTHER): Payer: BC Managed Care – PPO

## 2020-01-29 ENCOUNTER — Other Ambulatory Visit: Payer: Self-pay

## 2020-01-29 ENCOUNTER — Encounter: Payer: Self-pay | Admitting: Pulmonary Disease

## 2020-01-29 VITALS — BP 118/82 | HR 106 | Temp 98.0°F | Ht 65.0 in | Wt 212.0 lb

## 2020-01-29 DIAGNOSIS — R0600 Dyspnea, unspecified: Secondary | ICD-10-CM | POA: Diagnosis not present

## 2020-01-29 DIAGNOSIS — R000 Tachycardia, unspecified: Secondary | ICD-10-CM | POA: Diagnosis not present

## 2020-01-29 DIAGNOSIS — R059 Cough, unspecified: Secondary | ICD-10-CM | POA: Diagnosis not present

## 2020-01-29 DIAGNOSIS — R0609 Other forms of dyspnea: Secondary | ICD-10-CM

## 2020-01-29 NOTE — Progress Notes (Signed)
Cassie [name]    [id number]    25-Apr-1970  Primary Care Physician:Gutierrez, Garlon Hatchet, MD  Referring Physician: Ria Bush, MD [address] [place],  [place] [address]  Chief complaint: Follow-up for chronic cough, post Covid  HPI: 50 year old with allergies, asthma, Covid infection in August 2020 Has prolonged long-haul Covid presenting as cough, fatigue, exertion, hoarseness of voice, cough.  She is seen multiple pulmonary providers including Dr. Melvyn Novas in this office, [facility] with negative high-res CT in December 2020, PFTs at [facility] pulmonary was reportedly normal per report.  She had a borderline elevated D-dimer in early July at [facility] and CTA showed mild groundglass opacities with focal fibrosis [report below]  Has some response from high-dose steroids in the past.  Inhalers have not helped.  She has been treated well for postnasal drip, GERD without any change, also evaluated by Dr. Joya Gaskins ENT at [facility] for vocal cord issues.  Laryngoscopy showed mild laryngeal edema and complete closure.  She is is being treated with Elavil, Neurontin.  Also finished treatment with speech therapy at [facility] for irritable larynx with some improvement in hoarseness of voice.  She follows with Dr. Percival Spanish, cardiology for palpitations and has been started on calcium channel blocker She had a recrudescence of symptoms after her first dose of COVID-19 vaccine with increasing cough, chest tightness, fevers.  She was evaluated in the ED and given prednisone which improved symptoms chest tightness but she continues to have significant cough with hoarseness  Has been evaluated by neurology for brain fog.  Did not require additional treatment  Pets: No pets Occupation: Works as a Retail buyer Exposures: No known exposures.  No mold, hot tub, Jacuzzi.  No feather pillows or comforters Smoking history: Never smoker Travel history: Originally  from [place]. Relevant family history: No significant family issue of lung disease.  Interim history: Finished speech therapy at [facility] to have chronic cough, stridor, change in voice She is frustrated with the lack of progress and recurrent symptoms.  Outpatient Encounter Medications as of 01/29/2020  Medication Sig  . albuterol (PROVENTIL) (2.5 MG/3ML) 0.083% nebulizer solution Take 3 mLs (2.5 mg total) by nebulization every 6 (six) hours as needed for wheezing or shortness of breath.  Marland Kitchen amitriptyline (ELAVIL) 50 MG tablet Take 50 mg by mouth at bedtime.  . Biotin 5 MG CAPS Take 1 capsule (5 mg total) by mouth daily.  . chlorpheniramine (CHLOR-TRIMETON) 4 MG tablet Take by mouth. 4 times daily  . cholecalciferol (VITAMIN D3) 25 MCG (1000 UNIT) tablet Take 1,000 Units by mouth daily.  Marland Kitchen Dextromethorphan Polistirex (DELSYM PO) Take by mouth daily.  Marland Kitchen diltiazem (CARDIZEM CD) 240 MG 24 hr capsule Take 1 capsule (240 mg total) by mouth daily.  . famotidine (PEPCID) 20 MG tablet One after supper  . gabapentin (NEURONTIN) 600 MG tablet Take 1 tablet (600 mg total) by mouth 3 (three) times daily.  Marland Kitchen ipratropium (ATROVENT) 0.02 % nebulizer solution Inhale into the lungs.  . pantoprazole (PROTONIX) 40 MG tablet TAKE 1 TABLET (40 MG TOTAL) BY MOUTH DAILY. TAKE 30-60 MIN BEFORE FIRST MEAL OF THE DAY  . Respiratory Therapy Supplies (FLUTTER) DEVI Use as directed  . [DISCONTINUED] gabapentin (NEURONTIN) 300 MG capsule Take 1 capsule (300 mg total) by mouth 2 (two) times daily.  . [DISCONTINUED] predniSONE (DELTASONE) 20 MG tablet Take 1 tablet (20 mg total) by mouth  daily with breakfast. Take 40mg  daily for 5 days   No facility-administered encounter medications on file as of 01/29/2020.    Physical Exam: Blood pressure 118/82, pulse (!) 106, temperature 98 F (36.7 C), temperature source Oral, height 5\' 5"  (1.651 m), weight 212 lb (96.2 kg), last menstrual period 01/27/2000, SpO2 98  %. Gen:      No acute distress HEENT:  EOMI, sclera anicteric Neck:     No masses; no thyromegaly Lungs:    Clear to auscultation bilaterally; normal respiratory effort CV:         Regular rate and rhythm; no murmurs Abd:      + bowel sounds; soft, non-tender; no palpable masses, no distension Ext:    No edema; adequate peripheral perfusion Skin:      Warm and dry; no rash Neuro: alert and oriented x 3 Psych: normal mood and affect  Data Reviewed: Imaging: High-resolution CT 12/10/2018-no evidence of fibrotic interstitial lung disease, mild air trapping  CTA report from [facility] 07/10/2019-Bilateral lower lobe dependent heterogenous ground glass opacities, favoring dependent subsegmental atelectasis. Focal ground glass opacity involving the medial basal segment of right lower lobe, favoring fibrosis. No suspicious pulmonary nodule. No pleural effusion or pneumothorax.   High-res CT 08/07/2019-no interstitial lung disease, mild air trapping, mild centrilobular nodularity in the apices I have reviewed the images personally.  I have reviewed the images personally  PFTs: 08/21/2019 FVC 3.49 [93%], FEV1 2.92 [97%], F/F 84, TLC 5.51 [105%], DLCO 24.90 [112%] Normal test  Labs: CBC 03/17/2019-WBC 8.6, eos 2.2%, absolute eosinophil count 189 IgE 03/17/2019-14  Sed rate 06/18/2019-14  Assessment:  Long-haul Covid Atypical presentation with mostly vocal cord issues, dysphonia, irritable larynx. On Neurontin, Elavil and completed speech therapy.  She has not responded to inhalers or prednisone in the past  Initial high-res CT was negative but the report from [facility] on 07/10/2019 is read as new groundglass opacities with focal fibrosis but on our repeat scan she does not have any interstitial lung disease  Has worsening cough, hoarseness after Covid Continue Neurontin at 600 mg twice daily.  We discussed further increase in dose of Neurontin but she would like to avoid due to side  effects Continue albuterol as needed.  GERD On Protonix, Pepcid  Plan/Recommendations: Continue Neurontin to 600 mg twice daily Continue Protonix, Pepcid, pulmonary rehab  Marshell Garfinkel MD [facility] 01/29/2020, 2:40 PM  CC: Ria Bush, MD

## 2020-01-29 NOTE — Patient Instructions (Signed)
We will continue your medications as before Continue Neurontin at current dose Refer you back to Dr. Percival Spanish for tachycardia  Follow-up in 6 months.

## 2020-01-29 NOTE — Progress Notes (Incomplete)
Cassie [name]    [id number]    1970/04/16  Primary Care Physician:Gutierrez, Garlon Hatchet, MD  Referring Physician: Ria Bush, MD [address] [place],  [place] [address]  Chief complaint: Follow-up for chronic cough, post Covid  HPI: 50 year old with allergies, asthma, Covid infection in August 2020 Has prolonged long-haul Covid presenting as cough, fatigue, exertion, hoarseness of voice, cough.  She is seen multiple pulmonary providers including Dr. Melvyn Novas in this office, [facility] and [facility] with negative high-res CT in December 2020, PFTs at [facility] pulmonary was reportedly normal per report.  She had a borderline elevated D-dimer in early July at [facility] and CTA showed mild groundglass opacities with focal fibrosis [report below]  Has some response from high-dose steroids in the past.  Inhalers have not helped.  She has been treated well for postnasal drip, GERD without any change, also evaluated by Dr. Joya Gaskins ENT at [facility] for vocal cord issues.  Laryngoscopy showed mild laryngeal edema and complete closure.  She is is being treated with Elavil, Neurontin.  Also finished treatment with speech therapy at [facility] for irritable larynx with some improvement in hoarseness of voice.  She follows with Dr. Percival Spanish, cardiology for palpitations and has been started on calcium channel blocker  Pets: No pets Occupation: Works as a Retail buyer Exposures: No known exposures.  No mold, hot tub, Jacuzzi.  No feather pillows or comforters Smoking history: Never smoker Travel history: Originally from [place]. Relevant family history: No significant family issue of lung disease.  Interim history: She is making some progress with improvement in cough.  She had a recrudescence of symptoms after her first dose of COVID-19 vaccine with increasing cough, chest tightness, fevers.  She was evaluated in the ED and given prednisone which improved symptoms  chest tightness but she continues to have significant cough with hoarseness  Has been evaluated by neurology for brain fog.  Did not require additional treatment  Outpatient Encounter Medications as of 01/29/2020  Medication Sig  . albuterol (PROVENTIL) (2.5 MG/3ML) 0.083% nebulizer solution Take 3 mLs (2.5 mg total) by nebulization every 6 (six) hours as needed for wheezing or shortness of breath.  Marland Kitchen amitriptyline (ELAVIL) 50 MG tablet Take 50 mg by mouth at bedtime.  . Biotin 5 MG CAPS Take 1 capsule (5 mg total) by mouth daily.  . chlorpheniramine (CHLOR-TRIMETON) 4 MG tablet Take by mouth. 4 times daily  . cholecalciferol (VITAMIN D3) 25 MCG (1000 UNIT) tablet Take 1,000 Units by mouth daily.  Marland Kitchen Dextromethorphan Polistirex (DELSYM PO) Take by mouth daily.  Marland Kitchen diltiazem (CARDIZEM CD) 240 MG 24 hr capsule Take 1 capsule (240 mg total) by mouth daily.  . famotidine (PEPCID) 20 MG tablet One after supper  . gabapentin (NEURONTIN) 600 MG tablet Take 1 tablet (600 mg total) by mouth 3 (three) times daily.  Marland Kitchen ipratropium (ATROVENT) 0.02 % nebulizer solution Inhale into the lungs.  . pantoprazole (PROTONIX) 40 MG tablet TAKE 1 TABLET (40 MG TOTAL) BY MOUTH DAILY. TAKE 30-60 MIN BEFORE FIRST MEAL OF THE DAY  . Respiratory Therapy Supplies (FLUTTER) DEVI Use as directed  . [DISCONTINUED] gabapentin (NEURONTIN) 300 MG capsule Take 1 capsule (300 mg total) by mouth 2 (two) times daily.  . [DISCONTINUED] predniSONE (DELTASONE) 20 MG tablet Take 1 tablet (20 mg total) by mouth daily with breakfast. Take 40mg  daily for 5 days   No facility-administered encounter medications on file  as of 01/29/2020.    Physical Exam: Blood pressure 130/80, pulse 93, temperature 98 F (36.7 C), temperature source Oral, height 5\' 4"  (1.626 m), weight 209 lb 12.8 oz (95.2 kg), last menstrual period 01/27/2000, SpO2 97 %. Gen:      No acute distress HEENT:  EOMI, sclera anicteric Neck:     No masses; no  thyromegaly Lungs:    Clear to auscultation bilaterally; normal respiratory effort CV:         Regular rate and rhythm; no murmurs Abd:      + bowel sounds; soft, non-tender; no palpable masses, no distension Ext:    No edema; adequate peripheral perfusion Skin:      Warm and dry; no rash Neuro: alert and oriented x 3 Psych: normal mood and affect  Data Reviewed: Imaging: High-resolution CT 12/10/2018-no evidence of fibrotic interstitial lung disease, mild air trapping  CTA report from [facility] 07/10/2019-Bilateral lower lobe dependent heterogenous ground glass opacities, favoring dependent subsegmental atelectasis. Focal ground glass opacity involving the medial basal segment of right lower lobe, favoring fibrosis. No suspicious pulmonary nodule. No pleural effusion or pneumothorax.   High-res CT 08/07/2019-no interstitial lung disease, mild air trapping, mild centrilobular nodularity in the apices I have reviewed the images personally.  I have reviewed the images personally  PFTs: 08/21/2019 FVC 3.49 [93%], FEV1 2.92 [97%], F/F 84, TLC 5.51 [105%], DLCO 24.90 [112%] Normal test  Labs: CBC 03/17/2019-WBC 8.6, eos 2.2%, absolute eosinophil count 189 IgE 03/17/2019-14  Sed rate 06/18/2019-14  Assessment:  Long-haul Covid Atypical presentation with mostly vocal cord issues, dysphonia, irritable larynx. Continue the Neurontin, Elavil and speech therapy.  She has not responded to inhalers in the past  Initial high-res CT was negative but the report from [facility] on 07/10/2019 is read as new groundglass opacities with focal fibrosis but on our repeat scan she does not have any interstitial lung disease  Has worsening cough, hoarseness after recent Covid first vaccination I will increase the Neurontin to 600 mg twice daily Continue albuterol as needed. She is concerned about worsening symptoms after the second dose.  I have called in 5 days of 40 mg prednisone to be held in reserve if  she needs it.  GERD On Protonix, Pepcid  Plan/Recommendations: Increase Neurontin to 600 mg twice daily Continue Protonix, Pepcid, pulmonary rehab Prednisone 40 mg a day for 5 days in case she needs it after second dose of COVID-19 vaccine  Marshell Garfinkel MD [facility] 01/29/2020, 2:37 PM  CC: Ria Bush, MD

## 2020-02-02 NOTE — Progress Notes (Signed)
Cardiology Clinic Note   Patient Name: Cassie [name] Date of Encounter: 02/04/2020  Primary Care Provider:  Ria Bush, MD Primary Cardiologist:  Minus Breeding, MD  Patient Profile    Cassie [name] 50 year old female presents the clinic today for follow-up evaluation of her tachycardia.  Past Medical History    Past Medical History:  Diagnosis Date  . Allergy   . Asthma   . Cardiac disease   . Diffuse cystic mastopathy   . GERD (gastroesophageal reflux disease)   . Guttate psoriasis   . H/O syncope    cards work-up WNL (Hochrein) thought 2/2 overmedication vs arrhythmias  . Hx of endometriosis    hysterectomy  . Hx of migraines    occasional; improved since hysterectomy  . Labral tear of shoulder 02/2014   R, pending surgery Tamera Punt)  . Rosacea    mild   Past Surgical History:  Procedure Laterality Date  . BREAST BIOPSY  2010   left; core biopsy  . BREAST EXCISIONAL BIOPSY Bilateral    multiple bilateral  . BREAST MASS EXCISION  2008  . BREAST SURGERY Right    excision of lesion  . CARDIAC CATHETERIZATION  2007  . CHOLECYSTECTOMY  2008  . COLONOSCOPY  2006   WNL Nicolasa Ducking)  . COLONOSCOPY  06/2013   mod diverticulosis, rpt 10 yrs Henrene Pastor)  . LAPAROSCOPY  1994   endometriosis  . TONSILLECTOMY  1994  . TONSILLECTOMY AND ADENOIDECTOMY  1995  . TOTAL ABDOMINAL HYSTERECTOMY     Dr Rayford Halsted for endometriosis    Allergies  Allergies  Allergen Reactions  . Fish Allergy Anaphylaxis, Hives and Swelling  . Shellfish Allergy Anaphylaxis, Hives and Swelling  . Chocolate     migraines  . Cocoa Other (See Comments)    migraines migraines  . Penicillins Rash and Other (See Comments)    Parents are allergic Has patient had a PCN reaction causing immediate rash, facial/tongue/throat swelling, SOB or lightheadedness with hypotension: Yes Has patient had a PCN reaction causing severe rash involving mucus membranes or skin necrosis: Unk Has patient had a PCN  reaction that required hospitalization: Unk Has patient had a PCN reaction occurring within the last 10 years: No If all of the above answers are "NO", then may proceed with Cephalosporin use.No   . Tape Rash    History of Present Illness    Ms.Camplin has a PMH of palpitations, precordial chest pain, low TSH, DOE, cough, COVID-19 infection, vitamin D deficiency, obesity, syncope, tachycardia, and asthma.  She was previously seen by Dr. Percival Spanish for evaluation of her chest pain.  Of note she had normal coronary arteries via cardiac catheterization in 2006.  And nuclear stress test 2/18 showed low risk and an EF of 78%, no reversible ischemia or infarction.  Her echocardiogram 2/18 showed an EF of 60-65% with no valvular abnormalities.  She was referred to cardiology for evaluation of her palpitations and dizziness.  She was diagnosed with COVID-19 8/20.  Since having COVID-19 she noticed persistent dizziness with standing.  She also had a racing heartbeat that would fluctuate to low heartbeat.  A follow-up echocardiogram 2/21 showed an EF of 60-65%, normal pulmonary artery pressure, no significant valvular disorder.  A 3-day cardiac event monitor showed no significant arrhythmia.  She wore a 14-day cardiac event monitor which showed rare supraventricular beats.  She was felt to have "long-haul" symptoms from her COVID.  She was seen in the [facility] clinic.  Dr. Percival Spanish treated  her with Cardizem CD.  Her inflammatory markers were negative.  She was also seen and evaluated by pulmonology who performed a CT which showed no acute findings.  She was noted to have normal C-reactive protein, sed rate, troponins, and BNP.  At that time her Cardizem was increased.  She was last seen by Dr. Percival Spanish on 10/22/2019.  During that time she felt like she was improving slowly.  She was working with voice and speech therapy.  She had a PT study at [place].  She was also working with pulmonary rehab.  It was felt  that she was around 50% of her baseline.  Her cough had somewhat improved but was still present.  She still reported fatigue.  She reported that her breathing has not returned to baseline but denied orthopnea and PND.  Her palpitations were also improving and her blood pressure was controlled.  She presents the clinic today for follow-up evaluation states she is now working 8 hours a day from home.  She reports that previously she was not able to shower due to her lack of energy.  She has completed pulmonary rehab but has been able to reenroll back into the [facility] pulmonary rehab program on her own.  She reports that she continues to notice some intermittent elevations in her resting heart rate.  However today her EKG shows normal sinus rhythm with a rate of 99.  I will continue her current medication regimen, have her continue to increase her physical activity as tolerated, and see her back in 6 months.  Today she denies chest pain, shortness of breath, lower extremity edema, increased fatigue, palpitations, melena, hematuria, hemoptysis, diaphoresis, weakness, presyncope, syncope, orthopnea, and PND.   Home Medications    Prior to Admission medications   Medication Sig Start Date End Date Taking? Authorizing Provider  albuterol (PROVENTIL) (2.5 MG/3ML) 0.083% nebulizer solution Take 3 mLs (2.5 mg total) by nebulization every 6 (six) hours as needed for wheezing or shortness of breath. 10/24/18   Ria Bush, MD  amitriptyline (ELAVIL) 50 MG tablet Take 50 mg by mouth at bedtime. 04/07/19   [provider]  Biotin 5 MG CAPS Take 1 capsule (5 mg total) by mouth daily. 01/06/19   Ria Bush, MD  chlorpheniramine (CHLOR-TRIMETON) 4 MG tablet Take by mouth. 4 times daily    [provider]  cholecalciferol (VITAMIN D3) 25 MCG (1000 UNIT) tablet Take 1,000 Units by mouth daily.    [provider]  Dextromethorphan Polistirex (DELSYM PO) Take by mouth daily.    [provider]  diltiazem (CARDIZEM CD) 240 MG 24 hr capsule Take 1 capsule (240 mg total) by mouth daily. 10/22/19   Minus Breeding, MD  famotidine (PEPCID) 20 MG tablet One after supper 12/11/19   Mannam, Hart Robinsons, MD  gabapentin (NEURONTIN) 600 MG tablet Take 1 tablet (600 mg total) by mouth 3 (three) times daily. 12/04/19   Mannam, Hart Robinsons, MD  ipratropium (ATROVENT) 0.02 % nebulizer solution Inhale into the lungs. 06/11/19   [provider]  pantoprazole (PROTONIX) 40 MG tablet TAKE 1 TABLET (40 MG TOTAL) BY MOUTH DAILY. TAKE 30-60 MIN BEFORE FIRST MEAL OF THE DAY 01/15/20   Tanda Rockers, MD  Respiratory Therapy Supplies (FLUTTER) DEVI Use as directed 04/14/19   Tanda Rockers, MD    Family History    Family History  Problem Relation Age of Onset  . Skin cancer Mother        melanoma  . Hypertension  Mother        also had polio  . Irritable bowel syndrome Mother   . Breast cancer Mother 62  . Coronary artery disease Father        age 35's  . Hypertension Father   . Breast cancer Maternal Grandmother 46  . Stroke Maternal Grandmother   . Other Son        hyperinsulinemic  . Coronary artery disease Paternal Uncle        age 6's  . Skin cancer Sister 46       melanoma - cancer in eye and uterus  . Thyroid disease Sister        hashimoto thyroiditis  . Breast cancer Other        pat. great aunt  . Irritable bowel syndrome Daughter   . Diabetes Maternal Grandfather   . Heart disease Maternal Grandfather   . Diabetes Paternal Uncle   . Epilepsy Son   . Breast cancer Cousin        second cousin  . Pulmonary embolism Other        died from this after MVA  . Heart disease Paternal Grandmother   . Heart disease Paternal Grandfather   . Colon cancer Neg Hx    She indicated that her mother is alive. She indicated that her father is alive. She indicated that the status of her maternal grandmother is unknown. She indicated that the status of her maternal grandfather is  unknown. She indicated that the status of her paternal grandmother is unknown. She indicated that the status of her paternal grandfather is unknown. She indicated that the status of her daughter is unknown. She indicated that the status of her cousin is unknown. She indicated that the status of her neg hx is unknown. She indicated that one of her two others is deceased.  Social History    Social History   Socioeconomic History  . Marital status: Married    Spouse name: Not on file  . Number of children: 2  . Years of education: Not on file  . Highest education level: Not on file  Occupational History  . Occupation: Glass blower/designer  Tobacco Use  . Smoking status: Former Smoker    Packs/day: 0.10    Years: 5.00    Pack years: 0.50    Types: Cigarettes    Quit date: 02/21/2007    Years since quitting: 12.9  . Smokeless tobacco: Never Used  . Tobacco comment: social smoker  Vaping Use  . Vaping Use: Never used  Substance and Sexual Activity  . Alcohol use: Not Currently    Alcohol/week: 0.0 standard drinks    Comment: occas  . Drug use: No  . Sexual activity: Yes    Birth control/protection: Surgical  Other Topics Concern  . Not on file  Social History Narrative   Glass blower/designer   Lives with husband; 2 children; 1 dog   Son in prison   Social Determinants of Health   Financial Resource Strain: Not on file  Food Insecurity: Not on file  Transportation Needs: Not on file  Physical Activity: Not on file  Stress: Not on file  Social Connections: Not on file  Intimate Partner Violence: Not on file     Review of Systems    General:  No chills, fever, night sweats or weight changes.  Cardiovascular:  No chest pain, dyspnea on exertion, edema, orthopnea, palpitations, paroxysmal nocturnal dyspnea. Dermatological: No rash, lesions/masses Respiratory: No cough, dyspnea  Urologic: No hematuria, dysuria Abdominal:   No nausea, vomiting, diarrhea, bright red blood per rectum,  melena, or hematemesis Neurologic:  No visual changes, wkns, changes in mental status. All other systems reviewed and are otherwise negative except as noted above.  Physical Exam    VS:  BP 116/80 (BP Location: Left Arm, Patient Position: Sitting)   Pulse 99   Ht 5\' 5"  (1.651 m)   Wt 211 lb 12.8 oz (96.1 kg)   LMP 01/27/2000   SpO2 96%   BMI 35.25 kg/m  , BMI Body mass index is 35.25 kg/m. GEN: Well nourished, well developed, in no acute distress. HEENT: normal. Neck: Supple, no JVD, carotid bruits, or masses. Cardiac: RRR, no murmurs, rubs, or gallops. No clubbing, cyanosis, edema.  Radials/DP/PT 2+ and equal bilaterally.  Respiratory:  Respirations regular and unlabored, clear to auscultation bilaterally. GI: Soft, nontender, nondistended, BS + x 4. MS: no deformity or atrophy. Skin: warm and dry, no rash. Neuro:  Strength and sensation are intact. Psych: Normal affect.  Accessory Clinical Findings    Recent Labs: 03/17/2019: Hemoglobin 13.9; Platelets 231.0 06/18/2019: NT-Pro BNP 42   Recent Lipid Panel    Component Value Date/Time   CHOL 174 12/13/2017 0829   CHOL 158 02/16/2016 0826   TRIG 108.0 12/13/2017 0829   HDL 59.10 12/13/2017 0829   HDL 50 02/16/2016 0826   CHOLHDL 3 12/13/2017 0829   VLDL 21.6 12/13/2017 0829   LDLCALC 94 12/13/2017 0829   LDLCALC 82 02/16/2016 0826    ECG personally reviewed by me today-normal sinus rhythm possible left atrial enlargement low voltage QRS 99 bpm- No acute changes  Echocardiogram 02/11/2019 IMPRESSIONS    1. Left ventricular ejection fraction, by estimation, is 60 to 65%. The  left ventricle has normal function. The left ventrical has no regional  wall motion abnormalities. Left ventricular diastolic parameters were  normal.  2. Right ventricular systolic function is normal. The right ventricular  size is normal. There is normal pulmonary artery systolic pressure.  3. The mitral valve is normal in structure and  function. no evidence of  mitral valve regurgitation.  4. The aortic valve is tricuspid. Aortic valve regurgitation is trivial .  No aortic stenosis is present.  5. The inferior vena cava is normal in size with greater than 50%  respiratory variability, suggesting right atrial pressure of 3 mmHg.   Comparison(s): No significant change from prior study.  Assessment & Plan   1.  Palpitations-has noticed an improvement with her heart rate and denies increased palpitations.  Reports compliance with her Cardizem.  Echocardiogram 02/11/2019 showed normal LV function.  Blood pressure stable. Continue Cardizem Heart healthy low-sodium diet-salty 6 given Increase physical activity as tolerated Avoid triggers caffeine, chocolate, EtOH, dehydration etc.  Precordial chest pain-no chest pain today.  Low risk nuclear stress test 2/18 echocardiogram 2/21 showed normal LV function and no valvular abnormalities. Continue to monitor Heart healthy low-sodium diet-salty 6 given Increase physical activity as tolerated  COVID-19 infection-continues to improve from respiratory standpoint.  Feels her breathing has almost returned to baseline.  Cough has resolved. Continue albuterol, chlorphentermine, Delsym Follows with pulmonology  Disposition: Follow-up with Dr. Percival Spanish in 6 months.  Jossie Ng. Emiko Osorto NP-C    02/04/2020, 10:11 AM [address] Office [phone] Fax [phone]  Notice: This dictation was prepared with Dragon dictation along with smaller phrase technology. Any transcriptional errors that result from this process are unintentional and  may not be corrected upon review.  I spent 15 minutes examining this patient, reviewing medications, and using patient centered shared decision making involving her cardiac care.  Prior to her visit I spent greater than 20 minutes reviewing her past medical history,  medications, and prior cardiac  tests.

## 2020-02-04 ENCOUNTER — Other Ambulatory Visit: Payer: Self-pay

## 2020-02-04 ENCOUNTER — Encounter: Payer: Self-pay | Admitting: General Practice

## 2020-02-04 ENCOUNTER — Ambulatory Visit: Payer: BC Managed Care – PPO | Admitting: General Practice

## 2020-02-04 VITALS — BP 116/80 | HR 99 | Ht 65.0 in | Wt 211.8 lb

## 2020-02-04 DIAGNOSIS — U071 COVID-19: Secondary | ICD-10-CM

## 2020-02-04 DIAGNOSIS — R002 Palpitations: Secondary | ICD-10-CM

## 2020-02-04 DIAGNOSIS — R072 Precordial pain: Secondary | ICD-10-CM | POA: Diagnosis not present

## 2020-02-04 NOTE — Patient Instructions (Signed)
Medication Instructions:  The current medical regimen is effective;  continue present plan and medications as directed. Please refer to the Current Medication list given to you today.  *If you need a refill on your cardiac medications before your next appointment, please call your pharmacy*  Lab Work:   Testing/Procedures:  NONE    NONE  Special Instructions  PLEASE READ AND FOLLOW SALTY 6-ATTACHED-1,800mg  daily  PLEASE INCREASE PHYSICAL ACTIVITY AS TOLERATED  Follow-Up: Your next appointment:  6 month(s) In Person with Minus Breeding, MD OR IF UNAVAILABLE [place], FNP-C   Please call our office 2 months in advance to schedule this appointment   At [facility], you and your health needs are our priority.  As part of our continuing mission to provide you with exceptional heart care, we have created designated Provider Care Teams.  These Care Teams include your primary Cardiologist (physician) and Advanced Practice Providers (APPs -  Physician Assistants and Nurse Practitioners) who all work together to provide you with the care you need, when you need it.            6 SALTY THINGS TO AVOID     1,800MG  DAILY

## 2020-02-08 ENCOUNTER — Encounter: Payer: Self-pay | Admitting: Pulmonary Disease

## 2020-03-13 ENCOUNTER — Other Ambulatory Visit: Payer: Self-pay | Admitting: Internal Medicine

## 2020-03-13 ENCOUNTER — Other Ambulatory Visit: Payer: Self-pay | Admitting: Pulmonary Disease

## 2020-05-05 NOTE — Progress Notes (Signed)
Cardiology Office Note   Date:  05/06/2020   ID:  [name], [name] 28-Aug-1970, MRN [id number]  PCP:  Ria Bush, MD  Cardiologist:   Minus Breeding, MD   Chief Complaint  Patient presents with  . Tachycardia      History of Present Illness: Cassie [name] is a 50 y.o. female who presents for evaluation of dizziness.  She had chest pain in the past.  She had normal coronary arteries on cardiac catheterization in 2006.  Myoview obtained on 02/10/2016 was low risk with EF of 78%, no reversible ischemia or infarction.  Echocardiogram obtained on the same day showed EF 60 to 65%, no significant valve disorder.  The patient was referred to cardiology service for evaluation of palpitation and dizziness.  She was diagnosed with COVID-19 in August 2020.  Since her diagnosis, she has had persistent dizziness upon standing.  She also had racing heartbeat that fluctuate to low heart rate.   Repeat echocardiogram obtained on 02/11/2019 showed EF 60 to 65%, normal pulmonary artery pressure, no significant valve disorder.  3 days ZIO monitor did not show significant arrhythmia. She wore a 14 day monitor and had rare supraventricular beats.  Unfortunately she is having "long haul" symptoms from her Covid infection.   She has been seen in the [facility] clinic.  I treated her with Cardizem CD.  Inflammatory markers including hs trop were negative.   She has an appt with our pulmonary.   They did do a CT without any acute findings.  She did have normal C-reactive protein, sed rate, troponin and BNP.  At the last visit I increased her Cardizem.   Since she was last seen she has been doing pulmonary rehab at [facility].  She says that she has been slowly getting better and she is up to doing a little more exercise now 25 minutes on a treadmill at 3.3 miles up to a 2% grade for a few minutes.  Her oxygen saturations typically stay good.  She still has cough.  She certainly only 37% of her baseline in her  estimation.  Watchman concerning to her in the fall she is working with he is in her heart rate when she starts the exercise while she still at rest is in the 1 9 teens to 120s.  We spent quite a bit of time looking at her Watch and I see that her rates vary.  She wore a monitor previously and she did have sinus tachycardia with exertion in keeping with her dyspnea and post COVID syndrome when she wore this monitor last year.  She came down at rest.  Today her heart rates in the 80s   Past Medical History:  Diagnosis Date  . Allergy   . Asthma   . Cardiac disease   . Diffuse cystic mastopathy   . GERD (gastroesophageal reflux disease)   . Guttate psoriasis   . H/O syncope    cards work-up WNL (Norris Brumbach) thought 2/2 overmedication vs arrhythmias  . Hx of endometriosis    hysterectomy  . Hx of migraines    occasional; improved since hysterectomy  . Labral tear of shoulder 02/2014   R, pending surgery Tamera Punt)  . Rosacea    mild    Past Surgical History:  Procedure Laterality Date  . BREAST BIOPSY  2010   left; core biopsy  . BREAST EXCISIONAL BIOPSY Bilateral    multiple bilateral  . BREAST MASS EXCISION  2008  . BREAST  SURGERY Right    excision of lesion  . CARDIAC CATHETERIZATION  2007  . CHOLECYSTECTOMY  2008  . COLONOSCOPY  2006   WNL Nicolasa Ducking)  . COLONOSCOPY  06/2013   mod diverticulosis, rpt 10 yrs Henrene Pastor)  . LAPAROSCOPY  1994   endometriosis  . TONSILLECTOMY  1994  . TONSILLECTOMY AND ADENOIDECTOMY  1995  . TOTAL ABDOMINAL HYSTERECTOMY     Dr Rayford Halsted for endometriosis     Current Outpatient Medications  Medication Sig Dispense Refill  . albuterol (PROVENTIL) (2.5 MG/3ML) 0.083% nebulizer solution Take 3 mLs (2.5 mg total) by nebulization every 6 (six) hours as needed for wheezing or shortness of breath. 150 mL 0  . amitriptyline (ELAVIL) 50 MG tablet Take 50 mg by mouth at bedtime.    . Biotin 5 MG CAPS Take 1 capsule (5 mg total) by mouth daily.  0  .  chlorpheniramine (CHLOR-TRIMETON) 4 MG tablet Take by mouth. 4 times daily    . cholecalciferol (VITAMIN D3) 25 MCG (1000 UNIT) tablet Take 1,000 Units by mouth daily.    Marland Kitchen Dextromethorphan Polistirex (DELSYM PO) Take by mouth daily.    Marland Kitchen diltiazem (CARDIZEM CD) 240 MG 24 hr capsule Take 1 capsule (240 mg total) by mouth daily. 90 capsule 3  . famotidine (PEPCID) 20 MG tablet One after supper 30 tablet 11  . gabapentin (NEURONTIN) 100 MG capsule TAKE 1 CAPSULE (100 MG TOTAL) BY MOUTH 4 (FOUR) TIMES DAILY. 120 capsule 5  . gabapentin (NEURONTIN) 600 MG tablet TAKE 1 TABLET BY MOUTH THREE TIMES A DAY 180 tablet 1  . ipratropium (ATROVENT) 0.02 % nebulizer solution Inhale into the lungs.    Marland Kitchen Respiratory Therapy Supplies (FLUTTER) DEVI Use as directed 1 each 0  . pantoprazole (PROTONIX) 40 MG tablet Take 1 tablet (40 mg total) by mouth daily. Take 30-60 min before first meal of the day 90 tablet 3   No current facility-administered medications for this visit.    Allergies:   Fish allergy, Shellfish allergy, Chocolate, Cocoa, Penicillins, and Tape    ROS:  Please see the history of present illness.   Otherwise, review of systems are positive for none.   All other systems are reviewed and negative.    PHYSICAL EXAM: VS:  BP 102/78   Pulse 88   Ht 5\' 5"  (1.651 m)   Wt 203 lb 6.4 oz (92.3 kg)   LMP 01/27/2000   BMI 33.85 kg/m  , BMI Body mass index is 33.85 kg/m. GENERAL:  Well appearing NECK:  No jugular venous distention, waveform within normal limits, carotid upstroke brisk and symmetric, no bruits, no thyromegaly LUNGS:  Clear to auscultation bilaterally CHEST:  Unremarkable HEART:  PMI not displaced or sustained,S1 and S2 within normal limits, no S3, no S4, no clicks, no rubs, no murmurs ABD:  Flat, positive bowel sounds normal in frequency in pitch, no bruits, no rebound, no guarding, no midline pulsatile mass, no hepatomegaly, no splenomegaly EXT:  2 plus pulses throughout, no  edema, no cyanosis no clubbing   EKG:  EKG is  ordered today. Sinus rhythm, rate 88 axis within normal, intervals within normal limits, no acute ST-T wave changes.   Recent Labs: 06/18/2019: NT-Pro BNP 42    Lipid Panel    Component Value Date/Time   CHOL 174 12/13/2017 0829   CHOL 158 02/16/2016 0826   TRIG 108.0 12/13/2017 0829   HDL 59.10 12/13/2017 0829   HDL 50 02/16/2016 0826   CHOLHDL 3  12/13/2017 0829   VLDL 21.6 12/13/2017 0829   LDLCALC 94 12/13/2017 0829   LDLCALC 82 02/16/2016 0826      Wt Readings from Last 3 Encounters:  05/06/20 203 lb 6.4 oz (92.3 kg)  02/04/20 211 lb 12.8 oz (96.1 kg)  01/29/20 212 lb (96.2 kg)      Other studies Reviewed: Additional studies/ records that were reviewed today include:   Wearable review Review of the above records demonstrates:  Please see elsewhere in the note.     ASSESSMENT AND PLAN:   PALPITATIONS:      We talked about switching to short acting Cardizem so we could manipulate the timing of when she is getting this.  She might not need as much negative chronotropic activity at night and take it at different times during the day so that her heart rate is lower when she starts exercise.  She is going to record some more heart rate diaries so that I can review this and we can have more data to work with.  Right now she prefer not to make any changes.  I am going to check a TSH and CBC today.   COVID: Of note she was put back on high dose of Neurontin to treat her hoarseness and she does not think this made any difference.  She wants to come back down on this and I suggested that we could reduce this slowly over time.  Current medicines are reviewed at length with the patient today.  The patient does not have concerns regarding medicines.  The following changes have been made: As above  Labs/ tests ordered today include:   Orders Placed This Encounter  Procedures  . CBC w/Diff/Platelet  . TSH  . EKG 12-Lead      Disposition:   FU with me in 4 months.    Signed, Minus Breeding, MD  05/06/2020 1:13 PM    [place]

## 2020-05-06 ENCOUNTER — Ambulatory Visit: Payer: BC Managed Care – PPO | Admitting: Cardiology

## 2020-05-06 ENCOUNTER — Encounter: Payer: Self-pay | Admitting: Cardiology

## 2020-05-06 ENCOUNTER — Other Ambulatory Visit: Payer: Self-pay

## 2020-05-06 VITALS — BP 102/78 | HR 88 | Ht 65.0 in | Wt 203.4 lb

## 2020-05-06 DIAGNOSIS — R002 Palpitations: Secondary | ICD-10-CM | POA: Diagnosis not present

## 2020-05-06 DIAGNOSIS — R058 Other specified cough: Secondary | ICD-10-CM

## 2020-05-06 MED ORDER — PANTOPRAZOLE SODIUM 40 MG PO TBEC: 40.0000 mg | DELAYED_RELEASE_TABLET | Freq: Every day | ORAL | 3 refills | Status: DC

## 2020-05-06 NOTE — Patient Instructions (Signed)
Medication Instructions:  Continue same medications *If you need a refill on your cardiac medications before your next appointment, please call your pharmacy*   Lab Work: Cbc,tsh today If you have labs (blood work) drawn today and your tests are completely normal, you will receive your results only by: Marland Kitchen MyChart Message (if you have MyChart) OR . A paper copy in the mail If you have any lab test that is abnormal or we need to change your treatment, we will call you to review the results.   Testing/Procedures: None ordered   Follow-Up: At [facility], you and your health needs are our priority.  As part of our continuing mission to provide you with exceptional heart care, we have created designated Provider Care Teams.  These Care Teams include your primary Cardiologist (physician) and Advanced Practice Providers (APPs -  Physician Assistants and Nurse Practitioners) who all work together to provide you with the care you need, when you need it.  We recommend signing up for the patient portal called "MyChart".  Sign up information is provided on this After Visit Summary.  MyChart is used to connect with patients for Virtual Visits (Telemedicine).  Patients are able to view lab/test results, encounter notes, upcoming appointments, etc.  Non-urgent messages can be sent to your provider as well.   To learn more about what you can do with MyChart, go to [email or web address].    Your next appointment:  3 months   The format for your next appointment: Office    Provider:  Dr.Hochrein

## 2020-05-07 LAB — CBC WITH DIFFERENTIAL/PLATELET
Basophils Absolute: 0 10*3/uL (ref 0.0–0.2)
Basos: 1 %
EOS (ABSOLUTE): 0.1 10*3/uL (ref 0.0–0.4)
Eos: 2 %
Hematocrit: 40.2 % (ref 34.0–46.6)
Hemoglobin: 14.3 g/dL (ref 11.1–15.9)
Immature Grans (Abs): 0 10*3/uL (ref 0.0–0.1)
Immature Granulocytes: 0 %
Lymphocytes Absolute: 1.7 10*3/uL (ref 0.7–3.1)
Lymphs: 32 %
MCH: 32.5 pg (ref 26.6–33.0)
MCHC: 35.6 g/dL (ref 31.5–35.7)
MCV: 91 fL (ref 79–97)
Monocytes Absolute: 0.4 10*3/uL (ref 0.1–0.9)
Monocytes: 8 %
Neutrophils Absolute: 3.1 10*3/uL (ref 1.4–7.0)
Neutrophils: 57 %
Platelets: 237 10*3/uL (ref 150–450)
RBC: 4.4 x10E6/uL (ref 3.77–5.28)
RDW: 12.4 % (ref 11.7–15.4)
WBC: 5.3 10*3/uL (ref 3.4–10.8)

## 2020-05-07 LAB — TSH: TSH: 2.26 u[IU]/mL (ref 0.450–4.500)

## 2020-06-29 ENCOUNTER — Ambulatory Visit: Payer: BC Managed Care – PPO | Admitting: Dermatology

## 2020-06-29 ENCOUNTER — Encounter: Payer: Self-pay | Admitting: Dermatology

## 2020-06-29 ENCOUNTER — Other Ambulatory Visit: Payer: Self-pay

## 2020-06-29 DIAGNOSIS — L309 Dermatitis, unspecified: Secondary | ICD-10-CM

## 2020-06-29 MED ORDER — MOMETASONE FUROATE 0.1 % EX CREA: TOPICAL_CREAM | CUTANEOUS | 1 refills | Status: DC

## 2020-06-29 NOTE — Progress Notes (Signed)
   Follow-Up Visit   Subjective  Cassie [name] is a 50 y.o. female who presents for the following: lesion (Spot on left forehead. Dur: >2 months. Sometimes scaly. Peeled. Irritated at times. Denies itch unless dry. Dry at times, raw at times. Has used OTC HC cream and Neosporin. HC seemed to help while using. ).  [place] patient.  The following portions of the chart were reviewed this encounter and updated as appropriate:       Review of Systems: No other skin or systemic complaints except as noted in HPI or Assessment and Plan.   Objective  Well appearing patient in no apparent distress; mood and affect are within normal limits.  A focused examination was performed including head, including the scalp, face, neck, nose, ears, eyelids, and lips. Relevant physical exam findings are noted in the Assessment and Plan.  Left Forehead above eyebrow Bright pink macule, slight scale. 1.2 cm, no features of BCC on dermoscopy     Assessment & Plan  Dermatitis Left Forehead above eyebrow  Possible contact dermatitis vs seborrheic dermatitis.  Start Mometasone cream BID x2 weeks or until resolved D/c neosporin application  Topical steroids (such as triamcinolone, fluocinolone, fluocinonide, mometasone, clobetasol, halobetasol, betamethasone, hydrocortisone) can cause thinning and lightening of the skin if they are used for too long in the same area. Your physician has selected the right strength medicine for your problem and area affected on the body. Please use your medication only as directed by your physician to prevent side effects.    mometasone (ELOCON) 0.1 % cream - Left Forehead above eyebrow Apply BID for 2 weeks or until clear  Return in about 2 weeks (around 07/13/2020) for lesion recheck.  I, Emelia Salisbury, CMA, am acting as scribe for Brendolyn Patty, MD.  Documentation: I have reviewed the above documentation for accuracy and completeness, and I agree with the  above.  Brendolyn Patty MD

## 2020-06-29 NOTE — Patient Instructions (Addendum)
Topical steroids (such as triamcinolone, fluocinolone, fluocinonide, mometasone, clobetasol, halobetasol, betamethasone, hydrocortisone) can cause thinning and lightening of the skin if they are used for too long in the same area. Your physician has selected the right strength medicine for your problem and area affected on the body. Please use your medication only as directed by your physician to prevent side effects.    If you have any questions or concerns for your doctor, please call our main line at [phone] and press option 4 to reach your doctor's medical assistant. If no one answers, please leave a voicemail as directed and we will return your call as soon as possible. Messages left after 4 pm will be answered the following business day.   You may also send us a message via MyChart. We typically respond to MyChart messages within 1-2 business days.  For prescription refills, please ask your pharmacy to contact our office. Our fax number is [phone].  If you have an urgent issue when the clinic is closed that cannot wait until the next business day, you can page your doctor at the number below.    Please note that while we do our best to be available for urgent issues outside of office hours, we are not available 24/7.   If you have an urgent issue and are unable to reach us, you may choose to seek medical care at your doctor's office, retail clinic, urgent care center, or emergency room.  If you have a medical emergency, please immediately call 911 or go to the emergency department.  Pager Numbers  - Dr. Kowalski: [phone]  - Dr. Moye: [phone]  - Dr. Stewart: [phone]  In the event of inclement weather, please call our main line at [phone] for an update on the status of any delays or closures.  Dermatology Medication Tips: Please keep the boxes that topical medications come in in order to help keep track of the instructions about where and how to use  these. Pharmacies typically print the medication instructions only on the boxes and not directly on the medication tubes.   If your medication is too expensive, please contact our office at [phone] option 4 or send us a message through MyChart.   We are unable to tell what your co-pay for medications will be in advance as this is different depending on your insurance coverage. However, we may be able to find a substitute medication at lower cost or fill out paperwork to get insurance to cover a needed medication.   If a prior authorization is required to get your medication covered by your insurance company, please allow us 1-2 business days to complete this process.  Drug prices often vary depending on where the prescription is filled and some pharmacies may offer cheaper prices.  The website www.goodrx.com contains coupons for medications through different pharmacies. The prices here do not account for what the cost may be with help from insurance (it may be cheaper with your insurance), but the website can give you the price if you did not use any insurance.  - You can print the associated coupon and take it with your prescription to the pharmacy.  - You may also stop by our office during regular business hours and pick up a GoodRx coupon card.  - If you need your prescription sent electronically to a different pharmacy, notify our office through [facility] MyChart or by phone at [phone] option 4.  

## 2020-07-13 ENCOUNTER — Ambulatory Visit: Payer: BC Managed Care – PPO | Admitting: Dermatology

## 2020-07-13 ENCOUNTER — Other Ambulatory Visit: Payer: Self-pay

## 2020-07-13 DIAGNOSIS — L309 Dermatitis, unspecified: Secondary | ICD-10-CM | POA: Diagnosis not present

## 2020-07-13 MED ORDER — EUCRISA 2 % EX OINT: TOPICAL_OINTMENT | CUTANEOUS | 1 refills | Status: DC

## 2020-07-13 NOTE — Patient Instructions (Signed)

## 2020-07-13 NOTE — Progress Notes (Signed)
   Follow-Up Visit   Subjective  Cassie [name] is a 50 y.o. female who presents for the following: Dermatitis (Patient here today for follow up on dermatitis at left forehead above eyebrow. Given mometasone to use at area until clear. Patient reports it cleared and then returned. ).  The following portions of the chart were reviewed this encounter and updated as appropriate:     Objective  Well appearing patient in no apparent distress; mood and affect are within normal limits.  A focused examination was performed including forehead/eyebrows. Relevant physical exam findings are noted in the Assessment and Plan.  left upper eyebrow Light pink macule on left upper eyebrow no scale   Assessment & Plan  Dermatitis left upper eyebrow  Improved with mometasone  Start Eucrisa 2 % ointment - apply to affected areas of face twice daily as needed. 60 g 1 rf sent to [facility] 2 Eucrisa ointment samples given in office lot # [id number]/24  Continue avoiding Neosporin Continue Mometasone cream qd/bid prn flares   Crisaborole (EUCRISA) 2 % OINT - left upper eyebrow Apply to affected areas of face twice daily as needed.  Related Medications mometasone (ELOCON) 0.1 % cream Apply BID for 2 weeks or until clear  Return if symptoms worsen or fail to improve. I, Ruthell Rummage, CMA, am acting as scribe for Brendolyn Patty, MD.  Documentation: I have reviewed the above documentation for accuracy and completeness, and I agree with the above.  Brendolyn Patty MD

## 2020-08-14 NOTE — Progress Notes (Signed)
Cardiology Office Note   Date:  08/15/2020   ID:  Britnay, Fittipaldi 05-20-1970, MRN [id number]  PCP:  Ria Bush, MD  Cardiologist:   Minus Breeding, MD   Chief Complaint  Patient presents with   Palpitations       History of Present Illness: Cassie [name] is a 50 y.o. female who presents for evaluation of dizziness.  She had chest pain in the past.  She had normal coronary arteries on cardiac catheterization in 2006.  Myoview obtained on 02/10/2016 was low risk with EF of 78%, no reversible ischemia or infarction.  Echocardiogram obtained on the same day showed EF 60 to 65%, no significant valve disorder.  The patient was referred to cardiology service for evaluation of palpitation and dizziness.  She was diagnosed with COVID-19 in August 2020.  Since her diagnosis, she has had persistent dizziness upon standing.  She also had racing heartbeat that fluctuate to low heart rate.   Repeat echocardiogram obtained on 02/11/2019 showed EF 60 to 65%, normal pulmonary artery pressure, no significant valve disorder.  3 days ZIO monitor did not show significant arrhythmia. She wore a 14 day monitor and had rare supraventricular beats.  Unfortunately she is having "long haul" symptoms from her Covid infection.   She has been seen in the [facility] clinic.  I treated her with Cardizem CD.  Inflammatory markers including hs trop were negative.   She has an appt with our pulmonary.   They did do a CT without any acute findings.  She did have normal C-reactive protein, sed rate, troponin and BNP.   Since I last saw her she has had slightly reduced cough.  She has had a slightly increased exercise tolerance.  She is still seeing a post [place] clinic rehab situation at [facility] 3 times in the week.  She still very fatigued and has not gotten back to her level from 2 years ago.  She is having less breathlessness.  Her heart rates come down to be resting around 100.  When she exercises it goes to 140.   However, it is not as labile as previous.  She is not having any new symptomatic tachypalpitations, presyncope or syncope.      Past Medical History:  Diagnosis Date   Allergy    Asthma    Cardiac disease    Diffuse cystic mastopathy    GERD (gastroesophageal reflux disease)    Guttate psoriasis    H/O syncope    cards work-up WNL (Azul Coffie) thought 2/2 overmedication vs arrhythmias   Hx of endometriosis    hysterectomy   Hx of migraines    occasional; improved since hysterectomy   Labral tear of shoulder 02/2014   R, pending surgery Tamera Punt)   Rosacea    mild    Past Surgical History:  Procedure Laterality Date   BREAST BIOPSY  2010   left; core biopsy   BREAST EXCISIONAL BIOPSY Bilateral    multiple bilateral   BREAST MASS EXCISION  2008   BREAST SURGERY Right    excision of lesion   CARDIAC CATHETERIZATION  2007   CHOLECYSTECTOMY  2008   COLONOSCOPY  2006   WNL Nicolasa Ducking)   COLONOSCOPY  06/2013   mod diverticulosis, rpt 10 yrs Henrene Pastor)   LAPAROSCOPY  1994   endometriosis   TONSILLECTOMY  1994   TONSILLECTOMY AND ADENOIDECTOMY  1995   TOTAL ABDOMINAL HYSTERECTOMY     Dr Rayford Halsted for endometriosis  Current Outpatient Medications  Medication Sig Dispense Refill   albuterol (PROVENTIL) (2.5 MG/3ML) 0.083% nebulizer solution Take 3 mLs (2.5 mg total) by nebulization every 6 (six) hours as needed for wheezing or shortness of breath. 150 mL 0   amitriptyline (ELAVIL) 50 MG tablet Take 50 mg by mouth at bedtime.     Biotin 5 MG CAPS Take 1 capsule (5 mg total) by mouth daily.  0   chlorpheniramine (CHLOR-TRIMETON) 4 MG tablet Take by mouth. 4 times daily     cholecalciferol (VITAMIN D3) 25 MCG (1000 UNIT) tablet Take 1,000 Units by mouth daily.     Crisaborole (EUCRISA) 2 % OINT Apply to affected areas of face twice daily as needed. 60 g 1   Dextromethorphan Polistirex (DELSYM PO) Take by mouth daily.     diltiazem (CARDIZEM CD) 240 MG 24 hr capsule Take 1 capsule  (240 mg total) by mouth daily. 90 capsule 3   famotidine (PEPCID) 20 MG tablet One after supper 30 tablet 11   ipratropium (ATROVENT) 0.02 % nebulizer solution Inhale into the lungs. As Needed     pantoprazole (PROTONIX) 40 MG tablet Take 1 tablet (40 mg total) by mouth daily. Take 30-60 min before first meal of the day 90 tablet 3   Respiratory Therapy Supplies (FLUTTER) DEVI Use as directed 1 each 0   gabapentin (NEURONTIN) 100 MG capsule Take 1 capsule (100 mg total) by mouth daily. 120 capsule 5   No current facility-administered medications for this visit.    Allergies:   Fish allergy, Shellfish allergy, Chocolate, Cocoa, Penicillins, and Tape    ROS:  Please see the history of present illness.   Otherwise, review of systems are positive for none.   All other systems are reviewed and negative.    PHYSICAL EXAM: VS:  BP 118/82 (BP Location: Left Arm)   Pulse 93   Ht '5\' 5"'$  (1.651 m)   Wt 203 lb 3.2 oz (92.2 kg)   LMP 01/27/2000   SpO2 98%   BMI 33.81 kg/m  , BMI Body mass index is 33.81 kg/m. GENERAL:  Well appearing NECK:  No jugular venous distention, waveform within normal limits, carotid upstroke brisk and symmetric, no bruits, no thyromegaly LUNGS:  Clear to auscultation bilaterally CHEST:  Unremarkable HEART:  PMI not displaced or sustained,S1 and S2 within normal limits, no S3, no S4, no clicks, no rubs, no murmurs ABD:  Flat, positive bowel sounds normal in frequency in pitch, no bruits, no rebound, no guarding, no midline pulsatile mass, no hepatomegaly, no splenomegaly EXT:  2 plus pulses throughout, no edema, no cyanosis no clubbing  EKG:  EKG is not ordered today.    Recent Labs: 05/06/2020: Hemoglobin 14.3; Platelets 237; TSH 2.260    Lipid Panel    Component Value Date/Time   CHOL 174 12/13/2017 0829   CHOL 158 02/16/2016 0826   TRIG 108.0 12/13/2017 0829   HDL 59.10 12/13/2017 0829   HDL 50 02/16/2016 0826   CHOLHDL 3 12/13/2017 0829   VLDL 21.6  12/13/2017 0829   LDLCALC 94 12/13/2017 0829   LDLCALC 82 02/16/2016 0826      Wt Readings from Last 3 Encounters:  08/15/20 203 lb 3.2 oz (92.2 kg)  05/06/20 203 lb 6.4 oz (92.3 kg)  02/04/20 211 lb 12.8 oz (96.1 kg)      Other studies Reviewed: Additional studies/ records that were reviewed today include:   None Review of the above records demonstrates:  Please see  elsewhere in the note.     ASSESSMENT AND PLAN:   PALPITATIONS:      I am going to let her stay on the 240 mg of Cardizem but when I see her back hopefully we will be able to reduce this.  I think the key is going to be continued aerobic training to slowly bring her resting heart rate down.  COVID: Her hoarseness is slightly improved.  She is had very slow progress.  She is not getting any guidance any longer about using her gabapentin so I will take the liberty of reducing this to 100 mg daily and then at about 6 weeks she can try discontinuing it if she sees no change in her hoarseness or other symptoms.  The gabapentin could be contributing to her fatigue.   Current medicines are reviewed at length with the patient today.  The patient does not have concerns regarding medicines.  The following changes have been made: As above  Labs/ tests ordered today include: None  No orders of the defined types were placed in this encounter.    Disposition:   FU with me in 6 months.    Signed, Minus Breeding, MD  08/15/2020 6:27 PM    [place]

## 2020-08-15 ENCOUNTER — Other Ambulatory Visit: Payer: Self-pay

## 2020-08-15 ENCOUNTER — Encounter: Payer: Self-pay | Admitting: Cardiology

## 2020-08-15 ENCOUNTER — Ambulatory Visit: Payer: BC Managed Care – PPO | Admitting: Cardiology

## 2020-08-15 VITALS — BP 118/82 | HR 93 | Ht 65.0 in | Wt 203.2 lb

## 2020-08-15 DIAGNOSIS — R0602 Shortness of breath: Secondary | ICD-10-CM

## 2020-08-15 DIAGNOSIS — R002 Palpitations: Secondary | ICD-10-CM | POA: Diagnosis not present

## 2020-08-15 MED ORDER — GABAPENTIN 100 MG PO CAPS
100.0000 mg | ORAL_CAPSULE | Freq: Every day | ORAL | 5 refills | Status: DC
Start: 2020-08-15 — End: 2020-08-19

## 2020-08-15 NOTE — Patient Instructions (Signed)
Medication Instructions:   DECREASE GABAPENTIN TO 100 MG ONCE DAILY *If you need a refill on your cardiac medications before your next appointment, please call your pharmacy*   Follow-Up: At [facility], you and your health needs are our priority.  As part of our continuing mission to provide you with exceptional heart care, we have created designated Provider Care Teams.  These Care Teams include your primary Cardiologist (physician) and Advanced Practice Providers (APPs -  Physician Assistants and Nurse Practitioners) who all work together to provide you with the care you need, when you need it.  We recommend signing up for the patient portal called "MyChart".  Sign up information is provided on this After Visit Summary.  MyChart is used to connect with patients for Virtual Visits (Telemedicine).  Patients are able to view lab/test results, encounter notes, upcoming appointments, etc.  Non-urgent messages can be sent to your provider as well.   To learn more about what you can do with MyChart, go to [email or web address].    Your next appointment:   6 month(s)  The format for your next appointment:   In Person  Provider:   Minus Breeding, MD

## 2020-08-19 ENCOUNTER — Telehealth: Payer: Self-pay | Admitting: Cardiology

## 2020-08-19 MED ORDER — GABAPENTIN 300 MG PO CAPS: 300.0000 mg | ORAL_CAPSULE | Freq: Every day | ORAL | 0 refills | Status: DC

## 2020-08-19 NOTE — Telephone Encounter (Signed)
Patient states she has been taking 600 mg  ( 2 x 300 mg) twice a day  ( not 100 mg tablet) and prior to this taking 600 mg three times a day .she wanted to know how to titrate to 100 mg from 300 mg tablets .   Patient is apprehensive about reducing the dosage to 10 mg daily from taking 600 mg twice a day. 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

## 2020-08-19 NOTE — Telephone Encounter (Signed)
Dr Percival Spanish reviewed information fron telpehone note.    Per Dr Percival Spanish, patient can decrease to 300 mg  twice a day .   RN  called informed patient of  instructions Patient states she was taking 600 mg a day (total) - she actually takes 300 mg twice a day.   RN informed patient to decrease to 300 mg  once day.   Patient wanted to know how to wean off because she  recommend by Dr Percival Spanish to be weaned off by next appointment Feb 2023 appointment.  Patient is aware will contact her about instruction for weaning off Gabapentin.   ( Will need to send prescription 100 mg when needed)

## 2020-08-19 NOTE — Telephone Encounter (Signed)
Pt c/o medication issue:  1. Name of Medication: gabapentin (NEURONTIN) 300 MG capsule  2. How are you currently taking this medication (dosage and times per day)? Take 1 capsule (600 mg total) by mouth twice daily.  3. Are you having a reaction (difficulty breathing--STAT)? no  4. What is your medication issue? Pt is stating that he gabapentin dosage has changed from '300mg'$  to '100mg'$  would like to know Dr. Recommendation on going completely down to '100mg'$  please advise

## 2020-08-23 ENCOUNTER — Other Ambulatory Visit: Payer: Self-pay | Admitting: Family Medicine

## 2020-08-23 MED ORDER — GABAPENTIN 100 MG PO CAPS: 100.0000 mg | ORAL_CAPSULE | Freq: Every day | ORAL | 0 refills | Status: DC

## 2020-08-23 NOTE — Telephone Encounter (Signed)
Spoke with pt, aware of dr hochrein's recommendations.script for 100 mg sent to the pharmacy.

## 2020-08-30 ENCOUNTER — Ambulatory Visit: Payer: BC Managed Care – PPO | Admitting: Family Medicine

## 2020-08-30 ENCOUNTER — Encounter: Payer: Self-pay | Admitting: Family Medicine

## 2020-08-30 ENCOUNTER — Other Ambulatory Visit: Payer: Self-pay

## 2020-08-30 VITALS — BP 118/84 | HR 109 | Temp 98.1°F | Ht 65.0 in | Wt 202.3 lb

## 2020-08-30 DIAGNOSIS — Z1231 Encounter for screening mammogram for malignant neoplasm of breast: Secondary | ICD-10-CM | POA: Diagnosis not present

## 2020-08-30 DIAGNOSIS — Z0001 Encounter for general adult medical examination with abnormal findings: Secondary | ICD-10-CM

## 2020-08-30 DIAGNOSIS — Z803 Family history of malignant neoplasm of breast: Secondary | ICD-10-CM

## 2020-08-30 DIAGNOSIS — R7989 Other specified abnormal findings of blood chemistry: Secondary | ICD-10-CM

## 2020-08-30 DIAGNOSIS — E669 Obesity, unspecified: Secondary | ICD-10-CM

## 2020-08-30 DIAGNOSIS — Z808 Family history of malignant neoplasm of other organs or systems: Secondary | ICD-10-CM

## 2020-08-30 DIAGNOSIS — R490 Dysphonia: Secondary | ICD-10-CM

## 2020-08-30 DIAGNOSIS — E66811 Obesity, class 1: Secondary | ICD-10-CM

## 2020-08-30 DIAGNOSIS — E559 Vitamin D deficiency, unspecified: Secondary | ICD-10-CM

## 2020-08-30 DIAGNOSIS — Z13220 Encounter for screening for lipoid disorders: Secondary | ICD-10-CM

## 2020-08-30 DIAGNOSIS — R5382 Chronic fatigue, unspecified: Secondary | ICD-10-CM

## 2020-08-30 DIAGNOSIS — K219 Gastro-esophageal reflux disease without esophagitis: Secondary | ICD-10-CM

## 2020-08-30 DIAGNOSIS — R053 Chronic cough: Secondary | ICD-10-CM

## 2020-08-30 DIAGNOSIS — Z1159 Encounter for screening for other viral diseases: Secondary | ICD-10-CM

## 2020-08-30 DIAGNOSIS — G9332 Myalgic encephalomyelitis/chronic fatigue syndrome: Secondary | ICD-10-CM

## 2020-08-30 DIAGNOSIS — R002 Palpitations: Secondary | ICD-10-CM

## 2020-08-30 DIAGNOSIS — R058 Other specified cough: Secondary | ICD-10-CM

## 2020-08-30 DIAGNOSIS — U099 Post covid-19 condition, unspecified: Secondary | ICD-10-CM

## 2020-08-30 NOTE — Patient Instructions (Addendum)
Return at your convenience for fasting labwork.  Bump gabapentin back to '300mg'$  twice daily, monitor effect on current symptoms.  Call [place] to schedule screening mammogram  Call to schedule well woman exam with [facility].  Good to see you today. Return as needed or in 1 year for next physical.   Health Maintenance, Female Adopting a healthy lifestyle and getting preventive care are important in promoting health and wellness. Ask your health care provider about: The right schedule for you to have regular tests and exams. Things you can do on your own to prevent diseases and keep yourself healthy. What should I know about diet, weight, and exercise? Eat a healthy diet  Eat a diet that includes plenty of vegetables, fruits, low-fat dairy products, and lean protein. Do not eat a lot of foods that are high in solid fats, added sugars, or sodium.  Maintain a healthy weight Body mass index (BMI) is used to identify weight problems. It estimates body fat based on height and weight. Your health care provider can help determineyour BMI and help you achieve or maintain a healthy weight. Get regular exercise Get regular exercise. This is one of the most important things you can do for your health. Most adults should: Exercise for at least 150 minutes each week. The exercise should increase your heart rate and make you sweat (moderate-intensity exercise). Do strengthening exercises at least twice a week. This is in addition to the moderate-intensity exercise. Spend less time sitting. Even light physical activity can be beneficial. Watch cholesterol and blood lipids Have your blood tested for lipids and cholesterol at 48 years of age, then havethis test every 5 years. Have your cholesterol levels checked more often if: Your lipid or cholesterol levels are high. You are older than 48 years of age. You are at high risk for heart disease. What should I know about cancer screening? Depending on  your health history and family history, you may need to have cancer screening at various ages. This may include screening for: Breast cancer. Cervical cancer. Colorectal cancer. Skin cancer. Lung cancer. What should I know about heart disease, diabetes, and high blood pressure? Blood pressure and heart disease High blood pressure causes heart disease and increases the risk of stroke. This is more likely to develop in people who have high blood pressure readings, are of African descent, or are overweight. Have your blood pressure checked: Every 3-5 years if you are 69-5 years of age. Every year if you are 26 years old or older. Diabetes Have regular diabetes screenings. This checks your fasting blood sugar level. Have the screening done: Once every three years after age 36 if you are at a normal weight and have a low risk for diabetes. More often and at a younger age if you are overweight or have a high risk for diabetes. What should I know about preventing infection? Hepatitis B If you have a higher risk for hepatitis B, you should be screened for this virus. Talk with your health care provider to find out if you are at risk forhepatitis B infection. Hepatitis C Testing is recommended for: Everyone born from [birth year] through 1963. Anyone with known risk factors for hepatitis C. Sexually transmitted infections (STIs) Get screened for STIs, including gonorrhea and chlamydia, if: You are sexually active and are younger than 48 years of age. You are older than 48 years of age and your health care provider tells you that you are at risk for this type of infection.  Your sexual activity has changed since you were last screened, and you are at increased risk for chlamydia or gonorrhea. Ask your health care provider if you are at risk. Ask your health care provider about whether you are at high risk for HIV. Your health care provider may recommend a prescription medicine to help prevent HIV  infection. If you choose to take medicine to prevent HIV, you should first get tested for HIV. You should then be tested every 3 months for as long as you are taking the medicine. Pregnancy If you are about to stop having your period (premenopausal) and you may become pregnant, seek counseling before you get pregnant. Take 400 to 800 micrograms (mcg) of folic acid every day if you become pregnant. Ask for birth control (contraception) if you want to prevent pregnancy. Osteoporosis and menopause Osteoporosis is a disease in which the bones lose minerals and strength with aging. This can result in bone fractures. If you are 1 years old or older, or if you are at risk for osteoporosis and fractures, ask your health care provider if you should: Be screened for bone loss. Take a calcium or vitamin D supplement to lower your risk of fractures. Be given hormone replacement therapy (HRT) to treat symptoms of menopause. Follow these instructions at home: Lifestyle Do not use any products that contain nicotine or tobacco, such as cigarettes, e-cigarettes, and chewing tobacco. If you need help quitting, ask your health care provider. Do not use street drugs. Do not share needles. Ask your health care provider for help if you need support or information about quitting drugs. Alcohol use Do not drink alcohol if: Your health care provider tells you not to drink. You are pregnant, may be pregnant, or are planning to become pregnant. If you drink alcohol: Limit how much you use to 0-1 drink a day. Limit intake if you are breastfeeding. Be aware of how much alcohol is in your drink. In the [place] one drink equals one 12 oz bottle of beer (355 mL), one 5 oz glass of wine (148 mL), or one 1 oz glass of hard liquor (44 mL). General instructions Schedule regular health, dental, and eye exams. Stay current with your vaccines. Tell your health care provider if: You often feel depressed. You have ever been  abused or do not feel safe at home. Summary Adopting a healthy lifestyle and getting preventive care are important in promoting health and wellness. Follow your health care provider's instructions about healthy diet, exercising, and getting tested or screened for diseases. Follow your health care provider's instructions on monitoring your cholesterol and blood pressure. This information is not intended to replace advice given to you by your health care provider. Make sure you discuss any questions you have with your healthcare provider. Document Revised: 12/11/2017 Document Reviewed: 12/11/2017 Elsevier Patient Education  2022 [employer].

## 2020-08-30 NOTE — Progress Notes (Signed)
Patient ID: Cassie [name], female    DOB: September 26, 1970, 50 y.o.   MRN: [id number]  This visit was conducted in person.  BP 118/84   Pulse (!) 109   Temp 98.1 F (36.7 C) (Temporal)   Ht '5\' 5"'$  (1.651 m)   Wt 202 lb 5 oz (91.8 kg)   LMP 01/27/2000   SpO2 96%   BMI 33.67 kg/m    CC: follow up visit Subjective:   HPI: Cassie [name] is a 50 y.o. female presenting on 08/30/2020 for Follow-up (Pt tried to schedule mammogram but was told she needed to see PCP first. )   Last seen here 2021. Last physical 12/2017.  Continues working from home.   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 On gabapentin '600mg'$  bid for chronic cough - had dropped to '300mg'$  bid, now only on once daily. Taper off gabapentin may have led to hoarseness and fatigue and cough flare. Involved in [place] research study getting pulmonary/voice rehab - with significant benefit.   Known GERD, continues protonix, pepcid.   Sees cardiology for palpitations now on CCB diltiazem '240mg'$  daily.   Preventative: COLONOSCOPY 06/2013 - mod diverticulosis, rpt 10 yrs Henrene Pastor) Breast cancer screening - mammo 09/2019 [facility] @ [facility]. Previously followed by Dr Jamal Collin. Fmhx breast cancer (maternal grandmot her, mother, paternal aunt). Does breast exams at home. No new concerns. H/o benign biopsies in the past.  Well woman exam - with [facility] last seen 03/2016. Last pap 03/2016. s/p hysterectomy age 82yo for endometriosis, ovaries remain. Will call to schedule.  Lung cancer screening - not due DEXA scan - not yet due Flu shot - declines COVID vaccine Pfizer 11/2019, 12/2019, no booster  Td 2009, Tdap 12/2017.  Seat belt use discussed Sunscreen use discussed. No changing moles on skin. Yearly derm check. Sister h/o melanoma.  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 Interim medical history since our last visit reviewed. Allergies and medications reviewed and updated. Outpatient Medications Prior to Visit  Medication Sig Dispense Refill   albuterol (PROVENTIL) (2.5 MG/3ML) 0.083% nebulizer solution Take 3 mLs (2.5 mg total) by nebulization every 6 (six) hours as needed for wheezing or shortness of breath. 150 mL 0   amitriptyline (ELAVIL) 50 MG tablet Take 50 mg by mouth at bedtime.     Biotin 5 MG CAPS Take 1 capsule (5 mg total) by mouth daily.  0   chlorpheniramine (CHLOR-TRIMETON) 4 MG tablet Take by mouth. 4 times daily     cholecalciferol (VITAMIN D3) 25 MCG (1000 UNIT) tablet Take 1,000 Units by mouth daily.     Crisaborole (EUCRISA) 2 % OINT Apply to affected areas of face twice daily as needed. 60 g 1   Dextromethorphan Polistirex (DELSYM PO) Take by mouth daily.     diltiazem (CARDIZEM CD) 240 MG 24 hr capsule Take 1 capsule (240 mg total) by mouth daily. 90 capsule 3   famotidine (PEPCID) 20 MG tablet One after supper 30 tablet 11   gabapentin (NEURONTIN) 300 MG capsule Take 1 capsule (300 mg total) by mouth daily. [place]  capsule 0   ipratropium (ATROVENT) 0.02 % nebulizer solution Inhale into the lungs. As Needed     pantoprazole (PROTONIX) 40 MG tablet Take 1 tablet (40 mg total) by mouth daily. Take 30-60 min before first meal of the day 90 tablet 3   Respiratory Therapy Supplies (FLUTTER) DEVI Use as directed 1 each 0   gabapentin (NEURONTIN) 100 MG capsule Take 1 capsule (100 mg total) by mouth daily. (Patient not taking: Reported on 08/30/2020) 30 capsule 0   No facility-administered medications prior to visit.     Per HPI unless specifically indicated in ROS section below Review of Systems  Constitutional:  Negative for activity  change, appetite change, chills, fatigue, fever and unexpected weight change.  HENT:  Negative for hearing loss.   Eyes:  Negative for visual disturbance.  Respiratory:  Positive for cough and shortness of breath. Negative for chest tightness and wheezing.        Hoarseness   Cardiovascular:  Positive for palpitations. Negative for chest pain and leg swelling.  Gastrointestinal:  Positive for blood in stool (hemorrhoids) and constipation. Negative for abdominal distention, abdominal pain, diarrhea, nausea and vomiting.  Genitourinary:  Negative for difficulty urinating and hematuria.  Musculoskeletal:  Negative for arthralgias, myalgias and neck pain.  Skin:  Negative for rash.  Neurological:  Negative for dizziness, seizures, syncope and headaches.  Hematological:  Negative for adenopathy. Does not bruise/bleed easily.  Psychiatric/Behavioral:  Negative for dysphoric mood. The patient is not nervous/anxious.    Objective:  BP 118/84   Pulse (!) 109   Temp 98.1 F (36.7 C) (Temporal)   Ht '5\' 5"'$  (1.651 m)   Wt 202 lb 5 oz (91.8 kg)   LMP 01/27/2000   SpO2 96%   BMI 33.67 kg/m   Wt Readings from Last 3 Encounters:  08/30/20 202 lb 5 oz (91.8 kg)  08/15/20 203 lb 3.2 oz (92.2 kg)  05/06/20 203 lb 6.4 oz (92.3 kg)      Physical Exam Vitals and nursing note reviewed.  Constitutional:      Appearance: Normal appearance. She is not ill-appearing.  HENT:     Head: Normocephalic and atraumatic.     Right Ear: Tympanic membrane, ear canal and external ear normal. There is no impacted cerumen.     Left Ear: Tympanic membrane, ear canal and external ear normal. There is no impacted cerumen.  Eyes:     General:        Right eye: No discharge.        Left eye: No discharge.     Extraocular Movements: Extraocular movements intact.     Conjunctiva/sclera: Conjunctivae normal.     Pupils: Pupils are equal, round, and reactive to light.  Neck:     Thyroid: No thyroid mass or thyromegaly.   Cardiovascular:     Rate and Rhythm: Normal rate and regular rhythm.     Pulses: Normal pulses.     Heart sounds: Normal heart sounds. No murmur heard. Pulmonary:     Effort: Pulmonary effort is normal. No respiratory distress.     Breath sounds: Normal breath sounds. No wheezing, rhonchi or rales.  Abdominal:     General: Bowel sounds are normal. There is no distension.     Palpations: Abdomen is soft. There is no mass.     Tenderness: There is no abdominal tenderness. There is no guarding or rebound.     Hernia: No hernia is present.  Musculoskeletal:  Cervical back: Normal range of motion and neck supple. No rigidity.     Right lower leg: No edema.     Left lower leg: No edema.  Lymphadenopathy:     Cervical: No cervical adenopathy.  Skin:    General: Skin is warm and dry.     Findings: No rash.  Neurological:     General: No focal deficit present.     Mental Status: She is alert. Mental status is at baseline.  Psychiatric:        Mood and Affect: Mood normal.        Behavior: Behavior normal.      Results for orders placed or performed in visit on 05/06/20  CBC w/Diff/Platelet  Result Value Ref Range   WBC 5.3 3.4 - 10.8 x10E3/uL   RBC 4.40 3.77 - 5.28 x10E6/uL   Hemoglobin 14.3 11.1 - 15.9 g/dL   Hematocrit 40.2 34.0 - 46.6 %   MCV 91 79 - 97 fL   MCH 32.5 26.6 - 33.0 pg   MCHC 35.6 31.5 - 35.7 g/dL   RDW 12.4 11.7 - 15.4 %   Platelets 237 150 - 450 x10E3/uL   Neutrophils 57 Not Estab. %   Lymphs 32 Not Estab. %   Monocytes 8 Not Estab. %   Eos 2 Not Estab. %   Basos 1 Not Estab. %   Neutrophils Absolute 3.1 1.4 - 7.0 x10E3/uL   Lymphocytes Absolute 1.7 0.7 - 3.1 x10E3/uL   Monocytes Absolute 0.4 0.1 - 0.9 x10E3/uL   EOS (ABSOLUTE) 0.1 0.0 - 0.4 x10E3/uL   Basophils Absolute 0.0 0.0 - 0.2 x10E3/uL   Immature Granulocytes 0 Not Estab. %   Immature Grans (Abs) 0.0 0.0 - 0.1 x10E3/uL  TSH  Result Value Ref Range   TSH 2.260 0.450 - 4.500 uIU/mL     Assessment & Plan:  This visit occurred during the SARS-CoV-2 public health emergency.  Safety protocols were in place, including screening questions prior to the visit, additional usage of staff PPE, and extensive cleaning of exam room while observing appropriate contact time as indicated for disinfecting solutions.   Problem List Items Addressed This Visit     Encounter for general adult medical examination with abnormal findings - Primary (Chronic)    Preventative protocols reviewed and updated unless pt declined. Discussed healthy diet and lifestyle.       Family history of melanoma    Sees derm yearly       Family history of breast cancer   Obesity, Class I, BMI 30-34.9    Encouraged healthy diet choices       Vitamin D deficiency    Update levels on 1000 IU daily.       Relevant Orders   VITAMIN D 25 Hydroxy (Vit-D Deficiency, Fractures)   Post-COVID chronic fatigue    Chronic, ongoing.  Sees pulmonology, ENT, and undergoing rehab through [facility]. Experiences ongoing fatigue, hoarseness, cough for the past 2 years since COVID illness.  Taper off gabapentin may have caused recurrent cough/hoarseness - suggested return to '300mg'$  BID and monitor effect on these symptoms.       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  Receiving [facility]/pulm [facility]      Palpitations    Chronic sinus tachycardia since COVID followed by cards on diltiazem.  Low TSH level    recent TSH normal      Hoarseness of voice   Chronic cough   GERD (gastroesophageal reflux disease)    Continues protonix and pepcid      Other Visit Diagnoses     Screening mammogram for breast cancer       Relevant Orders   MM 3D SCREEN BREAST BILATERAL   Lipid screening       Relevant Orders   Lipid panel   Need for hepatitis C screening test        Relevant Orders   Hepatitis C antibody        No orders of the defined types were placed in this encounter.  Orders Placed This Encounter  Procedures   MM 3D SCREEN BREAST BILATERAL    Standing Status:   Future    Standing Expiration Date:   08/30/2021    Order Specific Question:   Reason for Exam (SYMPTOM  OR DIAGNOSIS REQUIRED)    Answer:   screening mammogram    Order Specific Question:   Preferred imaging location?    Answer:   [facility]    Order Specific Question:   Is the patient pregnant?    Answer:   No   Lipid panel    Standing Status:   Future    Number of Occurrences:   1    Standing Expiration Date:   09/01/2021   Comprehensive metabolic panel    Standing Status:   Future    Number of Occurrences:   1    Standing Expiration Date:   09/01/2021   Vitamin B12    Standing Status:   Future    Number of Occurrences:   1    Standing Expiration Date:   09/01/2021   VITAMIN D 25 Hydroxy (Vit-D Deficiency, Fractures)    Standing Status:   Future    Number of Occurrences:   1    Standing Expiration Date:   09/01/2021   Ferritin    Standing Status:   Future    Number of Occurrences:   1    Standing Expiration Date:   09/01/2021   Hepatitis C antibody    Standing Status:   Future    Standing Expiration Date:   09/01/2021     Patient instructions: Return at your convenience for fasting labwork.  Bump gabapentin back to '300mg'$  twice daily, monitor effect on current symptoms.  Call [facility] to schedule screening mammogram  Call to schedule well woman exam with [facility].  Good to see you today. Return as needed or in 1 year for next physical.   Follow up plan: Return in about 1 year (around 08/30/2021) for annual exam, prior fasting for blood work.  Ria Bush, MD

## 2020-08-30 NOTE — Assessment & Plan Note (Signed)
Preventative protocols reviewed and updated unless pt declined. Discussed healthy diet and lifestyle.  

## 2020-09-01 ENCOUNTER — Other Ambulatory Visit: Payer: Self-pay | Admitting: Family Medicine

## 2020-09-01 ENCOUNTER — Other Ambulatory Visit (INDEPENDENT_AMBULATORY_CARE_PROVIDER_SITE_OTHER): Payer: BC Managed Care – PPO

## 2020-09-01 ENCOUNTER — Other Ambulatory Visit: Payer: Self-pay

## 2020-09-01 DIAGNOSIS — R5382 Chronic fatigue, unspecified: Secondary | ICD-10-CM

## 2020-09-01 DIAGNOSIS — G9332 Myalgic encephalomyelitis/chronic fatigue syndrome: Secondary | ICD-10-CM

## 2020-09-01 DIAGNOSIS — K219 Gastro-esophageal reflux disease without esophagitis: Secondary | ICD-10-CM | POA: Insufficient documentation

## 2020-09-01 DIAGNOSIS — E559 Vitamin D deficiency, unspecified: Secondary | ICD-10-CM

## 2020-09-01 DIAGNOSIS — Z13220 Encounter for screening for lipoid disorders: Secondary | ICD-10-CM

## 2020-09-01 DIAGNOSIS — U099 Post covid-19 condition, unspecified: Secondary | ICD-10-CM | POA: Diagnosis not present

## 2020-09-01 LAB — COMPREHENSIVE METABOLIC PANEL
ALT: 38 U/L — ABNORMAL HIGH (ref 0–35)
AST: 23 U/L (ref 0–37)
Albumin: 4.4 g/dL (ref 3.5–5.2)
Alkaline Phosphatase: 105 U/L (ref 39–117)
BUN: 18 mg/dL (ref 6–23)
CO2: 28 mEq/L (ref 19–32)
Calcium: 9.9 mg/dL (ref 8.4–10.5)
Chloride: 103 mEq/L (ref 96–112)
Creatinine, Ser: 1.1 mg/dL (ref 0.40–1.20)
GFR: 59.76 mL/min — ABNORMAL LOW (ref >60.00)
Glucose, Bld: 94 mg/dL (ref 70–99)
Potassium: 4 mEq/L (ref 3.5–5.1)
Sodium: 139 mEq/L (ref 135–145)
Total Bilirubin: 0.6 mg/dL (ref 0.2–1.2)
Total Protein: 7.4 g/dL (ref 6.0–8.3)

## 2020-09-01 LAB — LIPID PANEL
Cholesterol: 170 mg/dL (ref 0–200)
HDL: 50.6 mg/dL
LDL Cholesterol: 90 mg/dL (ref 0–99)
NonHDL: 119.69
Total CHOL/HDL Ratio: 3
Triglycerides: 146 mg/dL (ref 0.0–149.0)
VLDL: 29.2 mg/dL (ref 0.0–40.0)

## 2020-09-01 LAB — VITAMIN D 25 HYDROXY (VIT D DEFICIENCY, FRACTURES): VITD: 32.83 ng/mL (ref 30.00–100.00)

## 2020-09-01 LAB — FERRITIN: Ferritin: 163.1 ng/mL (ref 10.0–291.0)

## 2020-09-01 LAB — VITAMIN B12: Vitamin B-12: 470 pg/mL (ref 211–911)

## 2020-09-01 NOTE — Assessment & Plan Note (Signed)
Update levels on 1000 IU daily.  

## 2020-09-01 NOTE — Assessment & Plan Note (Signed)
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  Receiving speech/pulm therapy through [facility]

## 2020-09-01 NOTE — Assessment & Plan Note (Signed)
recent TSH normal

## 2020-09-01 NOTE — Assessment & Plan Note (Signed)
Sees derm yearly

## 2020-09-01 NOTE — Assessment & Plan Note (Signed)
Continues protonix and pepcid

## 2020-09-01 NOTE — Assessment & Plan Note (Addendum)
Chronic, ongoing.  Sees pulmonology, ENT, and undergoing rehab through [facility]. Experiences ongoing fatigue, hoarseness, cough for the past 2 years since COVID illness.  Taper off gabapentin may have caused recurrent cough/hoarseness - suggested return to '300mg'$  BID and monitor effect on these symptoms.

## 2020-09-01 NOTE — Assessment & Plan Note (Signed)
Chronic sinus tachycardia since COVID followed by cards on diltiazem.

## 2020-09-01 NOTE — Assessment & Plan Note (Signed)
Encouraged healthy diet choices.  

## 2020-10-05 ENCOUNTER — Ambulatory Visit
Admission: RE | Admit: 2020-10-05 | Discharge: 2020-10-05 | Disposition: A | Discharge status: Home / Self Care | Payer: BC Managed Care – PPO | Source: Ambulatory Visit | Attending: Family Medicine | Admitting: Family Medicine

## 2020-10-05 ENCOUNTER — Other Ambulatory Visit: Payer: Self-pay

## 2020-10-05 DIAGNOSIS — Z1231 Encounter for screening mammogram for malignant neoplasm of breast: Secondary | ICD-10-CM | POA: Insufficient documentation

## 2020-10-10 ENCOUNTER — Other Ambulatory Visit: Payer: Self-pay | Admitting: Family Medicine

## 2020-10-10 DIAGNOSIS — N6320 Unspecified lump in the left breast, unspecified quadrant: Secondary | ICD-10-CM

## 2020-10-10 DIAGNOSIS — R928 Other abnormal and inconclusive findings on diagnostic imaging of breast: Secondary | ICD-10-CM

## 2020-10-17 ENCOUNTER — Telehealth: Payer: Self-pay

## 2020-10-17 ENCOUNTER — Other Ambulatory Visit: Payer: Self-pay

## 2020-10-17 ENCOUNTER — Ambulatory Visit
Admission: RE | Admit: 2020-10-17 | Discharge: 2020-10-17 | Disposition: A | Discharge status: Home / Self Care | Payer: BC Managed Care – PPO | Source: Ambulatory Visit | Attending: Emergency Medicine | Admitting: Emergency Medicine

## 2020-10-17 ENCOUNTER — Ambulatory Visit (INDEPENDENT_AMBULATORY_CARE_PROVIDER_SITE_OTHER): Payer: BC Managed Care – PPO

## 2020-10-17 VITALS — BP 121/85 | HR 103 | Temp 98.7°F | Resp 18

## 2020-10-17 DIAGNOSIS — R059 Cough, unspecified: Secondary | ICD-10-CM | POA: Diagnosis not present

## 2020-10-17 DIAGNOSIS — R0602 Shortness of breath: Secondary | ICD-10-CM

## 2020-10-17 MED ORDER — PREDNISONE 10 MG (21) PO TBPK: ORAL_TABLET | Freq: Every day | ORAL | 0 refills | Status: DC

## 2020-10-17 NOTE — Discharge Instructions (Addendum)
Your chest x-ray is normal.  Take the prednisone as directed.  Follow-up with your primary care provider.    Go to the emergency department if you have acute shortness of breath or other concerning symptoms.

## 2020-10-17 NOTE — Telephone Encounter (Signed)
[facility] - Client TELEPHONE ADVICE RECORD [facility] Patient Name: Cassie [name] Gender: Female DOB: [birth year] Age: 50 Y 9 M 29 D Return Phone Number: [phone] (Primary) Address: City/ State/ Zip: [place] [address] Client [facility] - Client Client Site [name] Physician Ria Bush - MD Contact Type Call Who Is Calling Patient / Member / Family / Caregiver Call Type Triage / Clinical Relationship To Patient Self Return Phone Number [phone] (Primary) Chief Complaint BREATHING - shortness of breath or sounds breathless Reason for Call Symptomatic / Request for Cassie [name] states she has a productive cough, having trouble breathing, shortness of breath, cold symptoms and voice is going in and out. Tested negative for covid. Was transferred from the office for triage. Translation No Nurse Assessment Nurse: Windle Guard, RN, Lesa Date/Time (Eastern Time): 10/17/2020 8:26:41 AM Confirm and document reason for call. If symptomatic, describe symptoms. ---Caller states she has productive cough, difficulty breathing, SOB. She states she has long term Covid hauler since 08/23/20 Does the patient have any new or worsening symptoms? ---Yes Will a triage be completed? ---Yes Related visit to physician within the last 2 weeks? ---No Does the PT have any chronic conditions? (i.e. diabetes, asthma, this includes High risk factors for pregnancy, etc.) ---Yes List chronic conditions. ---pulmonary and heart issues, Is the patient pregnant or possibly pregnant? (Ask all females between the ages of 31-55) ---No Is this a behavioral health or substance abuse call? ---No Guidelines Guideline Title Affirmed Question Affirmed Notes Nurse Date/Time (Eastern Time) COVID-19 - Persisting Symptoms Follow-up Call SEVERE difficulty breathing  (e.g., struggling for each breath, speaks in single words) Conner, RN, Lesa 10/17/2020 8:30:12 AM PLEASE NOTE: All timestamps contained within this report are represented as [place] Standard Time. CONFIDENTIALTY NOTICE: This fax transmission is intended only for the addressee. It contains information that is legally privileged, confidential or otherwise protected from use or disclosure. If you are not the intended recipient, you are strictly prohibited from reviewing, disclosing, copying using or disseminating any of this information or taking any action in reliance on or regarding this information. If you have received this fax in error, please notify us immediately by telephone so that we can arrange for its return to [place]. Phone: [phone], Toll-Free: [phone], Fax: [phone] Page: 2 of 2 Call Id: [id number] Jal. Time Eilene Ghazi Time) Disposition Final User 10/17/2020 8:24:21 AM Send to Urgent Lane [facility] 10/17/2020 8:36:51 AM 911 Outcome Documentation Conner, RN, Lesa Reason: refuses 10/17/2020 8:32:27 AM Call EMS 911 Now Yes Conner, RN, Emmaline Kluver Caller Disagree/Comply Disagree Caller Understands Yes PreDisposition Call Doctor Care Advice Given Per Guideline CALL EMS 911 NOW: * Immediate medical attention is needed. You need to hang up and call 911 (or an ambulance). CARE ADVICE given per COVID-19 - Persisting Symptoms Follow-Up Call (Adult) guideline. Comments User: Starleen Blue, RN Date/Time Eilene Ghazi Time): 10/17/2020 8:35:24 AM Caller disconnected prior to closing script Referrals GO TO FACILITY REFUSED

## 2020-10-17 NOTE — ED Provider Notes (Signed)
[facility]    CSN: [id number] Arrival date & time: 10/17/20  1155      History   Chief Complaint Chief Complaint  Patient presents with   Sinus Issues     HPI Cassie [name] is a 50 y.o. female.  Patient presents with 4-day history of productive cough, shortness of breath, sinus congestion, ear pain, hoarse voice, sore throat.  She denies fever, rash, vomiting, diarrhea, or other symptoms.  Treatment at home with albuterol inhaler.  She reports a negative at-home COVID test today and declines a PCR test here.  She requests a chest x-ray; she is concerned due to her history of pneumonia.  Her medical history includes chronic cough, asthma, post COVID chronic fatigue.  The history is provided by the patient and medical records.   Past Medical History:  Diagnosis Date   Allergy    Asthma    Cardiac disease    Diffuse cystic mastopathy    GERD (gastroesophageal reflux disease)    Guttate psoriasis    H/O syncope    cards work-up WNL (Hochrein) thought 2/2 overmedication vs arrhythmias   Hx of endometriosis    hysterectomy   Hx of migraines    occasional; improved since hysterectomy   Labral tear of shoulder 02/2014   R, pending surgery Tamera Punt)   Rosacea    mild    Patient Active Problem List   Diagnosis Date Noted   GERD (gastroesophageal reflux disease) 09/01/2020   Chronic cough 10/21/2019   Brain fog 08/21/2019   Hoarseness of voice 08/21/2019   Abnormal findings on diagnostic imaging of lung 08/21/2019   DOE (dyspnea on exertion) 04/16/2019   Palpitations 01/15/2019   Low TSH level 01/15/2019   Upper airway cough syndrome 11/21/2018   Urinary frequency 10/24/2018   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   Vitamin D deficiency 12/12/2017   Floaters, bilateral 11/08/2017   Stressful life events affecting family and household 11/08/2017   Extrinsic asthma 01/20/2016   Labral tear of shoulder 02/01/2014   Obesity, Class I, BMI 30-34.9 01/09/2013    Diffuse cystic mastopathy 08/18/2012   Family history of breast cancer 08/18/2012   Encounter for general adult medical examination with abnormal findings 02/27/2011   Family history of melanoma 02/27/2011   SYNCOPE, HX OF 08/11/2009   BREAST MASS, HX OF 08/11/2009    Past Surgical History:  Procedure Laterality Date   BREAST BIOPSY  2010   left; core biopsy   BREAST EXCISIONAL BIOPSY Bilateral    multiple bilateral   BREAST MASS EXCISION  2008   BREAST SURGERY Right    excision of lesion   CARDIAC CATHETERIZATION  2007   CHOLECYSTECTOMY  2008   COLONOSCOPY  2006   WNL Nicolasa Ducking)   COLONOSCOPY  06/2013   mod diverticulosis, rpt 10 yrs Henrene Pastor)   LAPAROSCOPY  1994   endometriosis   TONSILLECTOMY  1994   TONSILLECTOMY AND ADENOIDECTOMY  1995   TOTAL ABDOMINAL HYSTERECTOMY     Dr Rayford Halsted for endometriosis    OB History     Gravida  3   Para  2   Term  2   Preterm      AB  1   Living  2      SAB  1   IAB      Ectopic      Multiple      Live Births  2        Obstetric Comments  1st Menstrual  Cycle: 13 1st Pregnancy:  23          Home Medications    Prior to Admission medications   Medication Sig Start Date End Date Taking? Authorizing Provider  albuterol (PROVENTIL) (2.5 MG/3ML) 0.083% nebulizer solution Take 3 mLs (2.5 mg total) by nebulization every 6 (six) hours as needed for wheezing or shortness of breath. 10/24/18  Yes Ria Bush, MD  amitriptyline (ELAVIL) 50 MG tablet Take 50 mg by mouth at bedtime. 04/07/19  Yes [provider]  Biotin 5 MG CAPS Take 1 capsule (5 mg total) by mouth daily. 01/06/19  Yes Ria Bush, MD  chlorpheniramine (CHLOR-TRIMETON) 4 MG tablet Take by mouth. 4 times daily   Yes [provider]  Dextromethorphan Polistirex (DELSYM PO) Take by mouth daily.   Yes [provider]  diltiazem (CARDIZEM CD) 240 MG 24 hr capsule Take 1 capsule (240 mg total) by mouth daily. 10/22/19  Yes  Minus Breeding, MD  famotidine (PEPCID) 20 MG tablet One after supper 12/11/19  Yes Mannam, Praveen, MD  gabapentin (NEURONTIN) 300 MG capsule Take 1 capsule (300 mg total) by mouth daily. 08/19/20  Yes Leonie Man, MD  ipratropium (ATROVENT) 0.02 % nebulizer solution Inhale into the lungs. As Needed 06/11/19  Yes [provider]  pantoprazole (PROTONIX) 40 MG tablet Take 1 tablet (40 mg total) by mouth daily. Take 30-60 min before first meal of the day 05/06/20  Yes Hochrein, Jeneen Rinks, MD  predniSONE (STERAPRED UNI-PAK 21 TAB) 10 MG (21) TBPK tablet Take by mouth daily. As directed 10/17/20  Yes Sharion Balloon, NP  cholecalciferol (VITAMIN D3) 25 MCG (1000 UNIT) tablet Take 1,000 Units by mouth daily.    [provider]  Crisaborole (EUCRISA) 2 % OINT Apply to affected areas of face twice daily as needed. 07/13/20   Brendolyn Patty, MD  Respiratory Therapy Supplies (FLUTTER) DEVI Use as directed 04/14/19   Tanda Rockers, MD    Family History Family History  Problem Relation Age of Onset   Skin cancer Mother        melanoma   Hypertension Mother        also had polio   Irritable bowel syndrome Mother    Breast cancer Mother 24   Coronary artery disease Father        age 8's   Hypertension Father    Breast cancer Maternal Grandmother 43   Stroke Maternal Grandmother    Other Son        hyperinsulinemic   Coronary artery disease Paternal Uncle        age 39's   Skin cancer Sister 75       melanoma - cancer in eye and uterus   Thyroid disease Sister        hashimoto thyroiditis   Breast cancer Other        pat. great aunt   Irritable bowel syndrome Daughter    Diabetes Maternal Grandfather    Heart disease Maternal Grandfather    Diabetes Paternal Uncle    Epilepsy Son    Breast cancer Cousin        second cousin   Pulmonary embolism Other        died from this after MVA   Heart disease Paternal Grandmother    Heart disease Paternal Grandfather    Colon  cancer Neg Hx     Social History Social History   Tobacco Use   Smoking status: Former  Packs/day: 0.10    Years: 5.00    Pack years: 0.50    Types: Cigarettes    Quit date: 02/21/2007    Years since quitting: 13.6   Smokeless tobacco: Never   Tobacco comments:    social smoker  Vaping Use   Vaping Use: Never used  Substance Use Topics   Alcohol use: Not Currently    Alcohol/week: 0.0 standard drinks    Comment: occas   Drug use: No     Allergies   Fish allergy, Shellfish allergy, Chocolate, Cocoa, Penicillins, and Tape   Review of Systems Review of Systems  Constitutional:  Negative for chills and fever.  HENT:  Positive for congestion, ear pain, sore throat and voice change.   Respiratory:  Positive for cough and shortness of breath.   Cardiovascular:  Negative for chest pain and palpitations.  Gastrointestinal:  Negative for abdominal pain, diarrhea and vomiting.  Skin:  Negative for color change and rash.  All other systems reviewed and are negative.   Physical Exam Triage Vital Signs ED Triage Vitals  Enc Vitals Group     BP      Pulse      Resp      Temp      Temp src      SpO2      Weight      Height      Head Circumference      Peak Flow      Pain Score      Pain Loc      Pain Edu?      Excl. in [place]?    No data found.  Updated Vital Signs BP 121/85 (BP Location: Left Arm)   Pulse (!) 103   Temp 98.7 F (37.1 C) (Oral)   Resp 18   LMP 01/27/2000   SpO2 97%   Visual Acuity Right Eye Distance:   Left Eye Distance:   Bilateral Distance:    Right Eye Near:   Left Eye Near:    Bilateral Near:     Physical Exam Vitals and nursing note reviewed.  Constitutional:      General: She is not in acute distress.    Appearance: She is well-developed. She is not ill-appearing.  HENT:     Head: Normocephalic and atraumatic.     Right Ear: Tympanic membrane normal.     Left Ear: Tympanic membrane normal.     Nose: Nose normal.      Mouth/Throat:     Mouth: Mucous membranes are moist.     Pharynx: Oropharynx is clear.  Eyes:     Conjunctiva/sclera: Conjunctivae normal.  Cardiovascular:     Rate and Rhythm: Normal rate and regular rhythm.     Heart sounds: Normal heart sounds.  Pulmonary:     Effort: Pulmonary effort is normal. No respiratory distress.     Breath sounds: Normal breath sounds.  Abdominal:     Palpations: Abdomen is soft.     Tenderness: There is no abdominal tenderness.  Musculoskeletal:     Cervical back: Neck supple.  Skin:    General: Skin is warm and dry.  Neurological:     General: No focal deficit present.     Mental Status: She is alert and oriented to person, place, and time.     Gait: Gait normal.  Psychiatric:        Mood and Affect: Mood normal.        Behavior: Behavior normal.  UC Treatments / Results  Labs (all labs ordered are listed, but only abnormal results are displayed) Labs Reviewed - No data to display  EKG   Radiology DG Chest 2 View  Result Date: 10/17/2020 CLINICAL DATA:  Cough, shortness of breath EXAM: CHEST - 2 VIEW COMPARISON:  01/29/2020 FINDINGS: The heart size and mediastinal contours are within normal limits. Both lungs are clear. Disc degenerative disease of the thoracic spine. IMPRESSION: No acute abnormality of the lungs. Electronically Signed   By: Delanna Ahmadi M.D.   On: 10/17/2020 12:53    Procedures Procedures (including critical care time)  Medications Ordered in UC Medications - No data to display  Initial Impression / Assessment and Plan / UC Course  I have reviewed the triage vital signs and the nursing notes.  Pertinent labs & imaging results that were available during my care of the patient were reviewed by me and considered in my medical decision making (see chart for details).   Cough, SOB.  Patient is well-appearing and her exam is reassuring.  O2 sat 97% on room air.  CXR normal.  Patient declines COVID test.  Treating with  prednisone and continued use of albuterol.  Instructed patient to follow-up with her PCP.  ED precautions discussed.  She agrees to plan of care.   Final Clinical Impressions(s) / UC Diagnoses   Final diagnoses:  SOB (shortness of breath)  Cough, unspecified type     Discharge Instructions      Your chest x-ray is normal.  Take the prednisone as directed.  Follow-up with your primary care provider.    Go to the emergency department if you have acute shortness of breath or other concerning symptoms.         ED Prescriptions     Medication Sig Dispense Auth. Provider   predniSONE (STERAPRED UNI-PAK 21 TAB) 10 MG (21) TBPK tablet Take by mouth daily. As directed 21 tablet Sharion Balloon, NP      PDMP not reviewed this encounter.   Sharion Balloon, NP 10/17/20 [phone]

## 2020-10-17 NOTE — Telephone Encounter (Signed)
Patient notified as instructed by telephone and verbalized understanding. Patient stated hat she has been taking Delsym and cough medication with codeine. Patient stated that she is okay now with the cough medication that she has. Patient stated that she is hoping the Prednisone will help improve her symptoms. Patient stated that she will call back for an appointment after taking the Prednisone if she needs to be seen.

## 2020-10-17 NOTE — Telephone Encounter (Signed)
Pt seen at [facility] with reassuring exam and CXR, placed on prednisone course.  I am out of office for most of this week. If ongoing symptoms after prednisone or worsening, recommend in office appt next week.   Let us know if interested in starting cough medicine and if so is there one that has been most effective for her?

## 2020-10-17 NOTE — Telephone Encounter (Signed)
Per appt notes pt scheduled thru mychart an appt at [facility] in [facility] 10/17/20 at 12 noon. Sending note to Dr Darnell Level and Lattie Haw CMA. Will also teams [place].

## 2020-10-17 NOTE — ED Triage Notes (Addendum)
Pt c/o cough, sinus pressure/pain, bilateral ear pain , chest congestion, and SOB x 5 days.

## 2020-10-26 ENCOUNTER — Ambulatory Visit
Admission: RE | Admit: 2020-10-26 | Discharge: 2020-10-26 | Disposition: A | Discharge status: Home / Self Care | Payer: BC Managed Care – PPO | Source: Ambulatory Visit | Attending: Family Medicine | Admitting: Family Medicine

## 2020-10-26 ENCOUNTER — Other Ambulatory Visit: Payer: Self-pay

## 2020-10-26 DIAGNOSIS — R922 Inconclusive mammogram: Secondary | ICD-10-CM | POA: Diagnosis not present

## 2020-10-26 DIAGNOSIS — N6320 Unspecified lump in the left breast, unspecified quadrant: Secondary | ICD-10-CM | POA: Insufficient documentation

## 2020-10-26 DIAGNOSIS — R928 Other abnormal and inconclusive findings on diagnostic imaging of breast: Secondary | ICD-10-CM | POA: Diagnosis not present

## 2020-11-01 ENCOUNTER — Telehealth: Payer: Self-pay | Admitting: Cardiology

## 2020-11-01 ENCOUNTER — Other Ambulatory Visit: Payer: Self-pay

## 2020-11-01 ENCOUNTER — Other Ambulatory Visit: Payer: Self-pay | Admitting: Cardiology

## 2020-11-01 MED ORDER — DILTIAZEM HCL ER COATED BEADS 240 MG PO CP24: 240.0000 mg | ORAL_CAPSULE | Freq: Every day | ORAL | 3 refills | Status: DC

## 2020-11-01 MED ORDER — AMITRIPTYLINE HCL 50 MG PO TABS: 50.0000 mg | ORAL_TABLET | Freq: Every day | ORAL | 0 refills | Status: DC

## 2020-11-01 NOTE — Telephone Encounter (Signed)
Could you please take a look at this refill because the directions that's on the script doesn't match the directions from the last encounter the patient had.

## 2020-11-01 NOTE — Telephone Encounter (Signed)
Left message for pt to call.

## 2020-11-01 NOTE — Telephone Encounter (Signed)
Spoke to patient Cassie [name] refill sent to pharmacy.

## 2020-11-01 NOTE — Telephone Encounter (Signed)
Spoke to patient advised I will send message to Dr.Hochrein for Amitriptyline refill approval.Diltiazem refill sent to pharmacy.

## 2020-11-01 NOTE — Telephone Encounter (Signed)
*  STAT* If patient is at the pharmacy, call can be transferred to refill team.   1. Which medications need to be refilled? (please list name of each medication and dose if known) amitriptyline (ELAVIL) 50 MG tablet  diltiazem (CARDIZEM CD) 240 MG 24 hr capsule  2. Which pharmacy/location (including street and city if local pharmacy) is medication to be sent to?[id number] [address], [place]  3. Do they need a 30 day or 90 day supply? [place]

## 2020-12-29 ENCOUNTER — Other Ambulatory Visit: Payer: Self-pay | Admitting: Pulmonary Disease

## 2020-12-29 DIAGNOSIS — R058 Other specified cough: Secondary | ICD-10-CM

## 2021-01-12 ENCOUNTER — Other Ambulatory Visit: Payer: Self-pay | Admitting: Pulmonary Disease

## 2021-01-12 DIAGNOSIS — R058 Other specified cough: Secondary | ICD-10-CM

## 2021-01-29 IMAGING — DX DG CHEST 2V
2 series · 2 of 2 positions shown · non-contrast
Comparison: 10/24/2018

CLINICAL DATA: Persistent cough

EXAM:
CHEST - 2 VIEW

[chest pa]
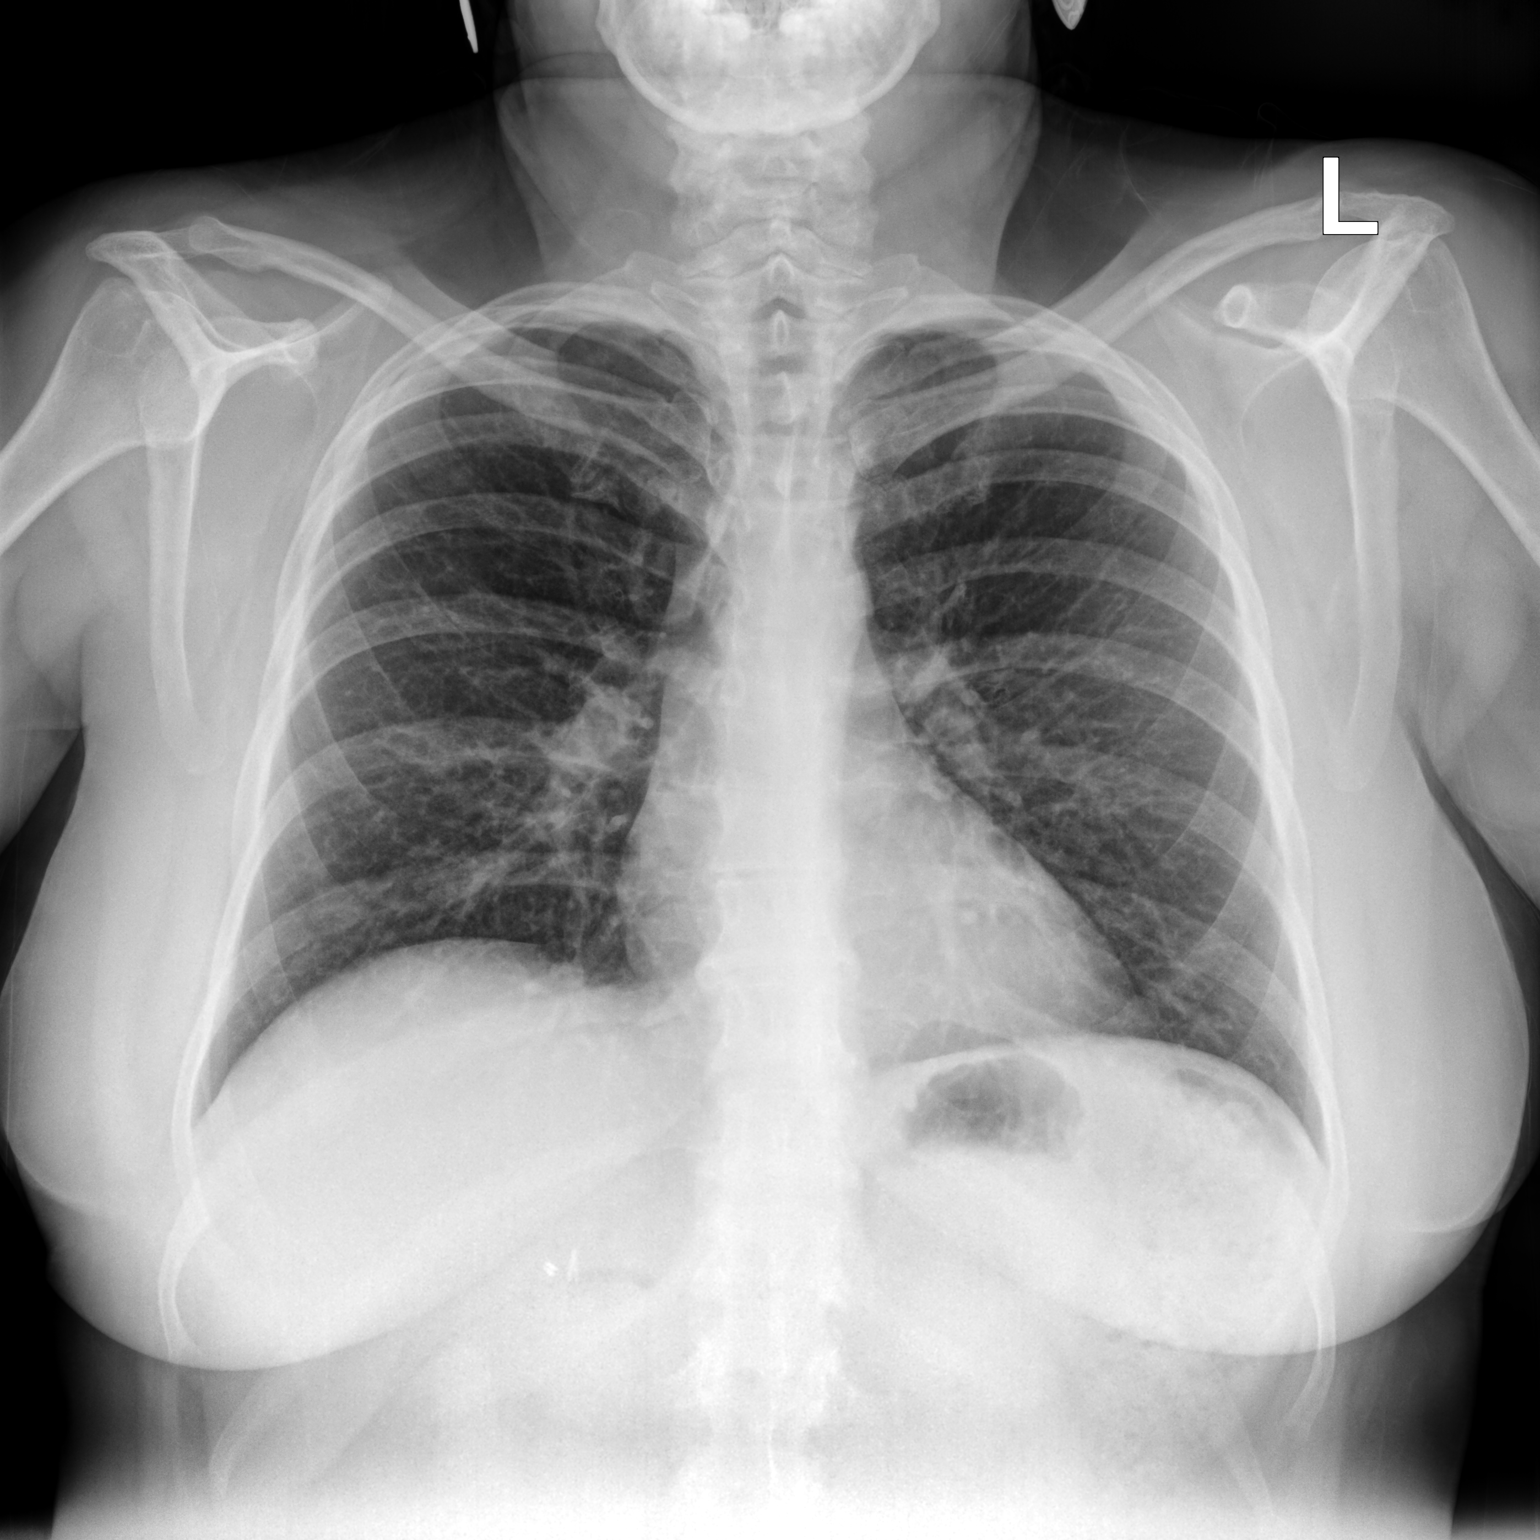

[chest lat]
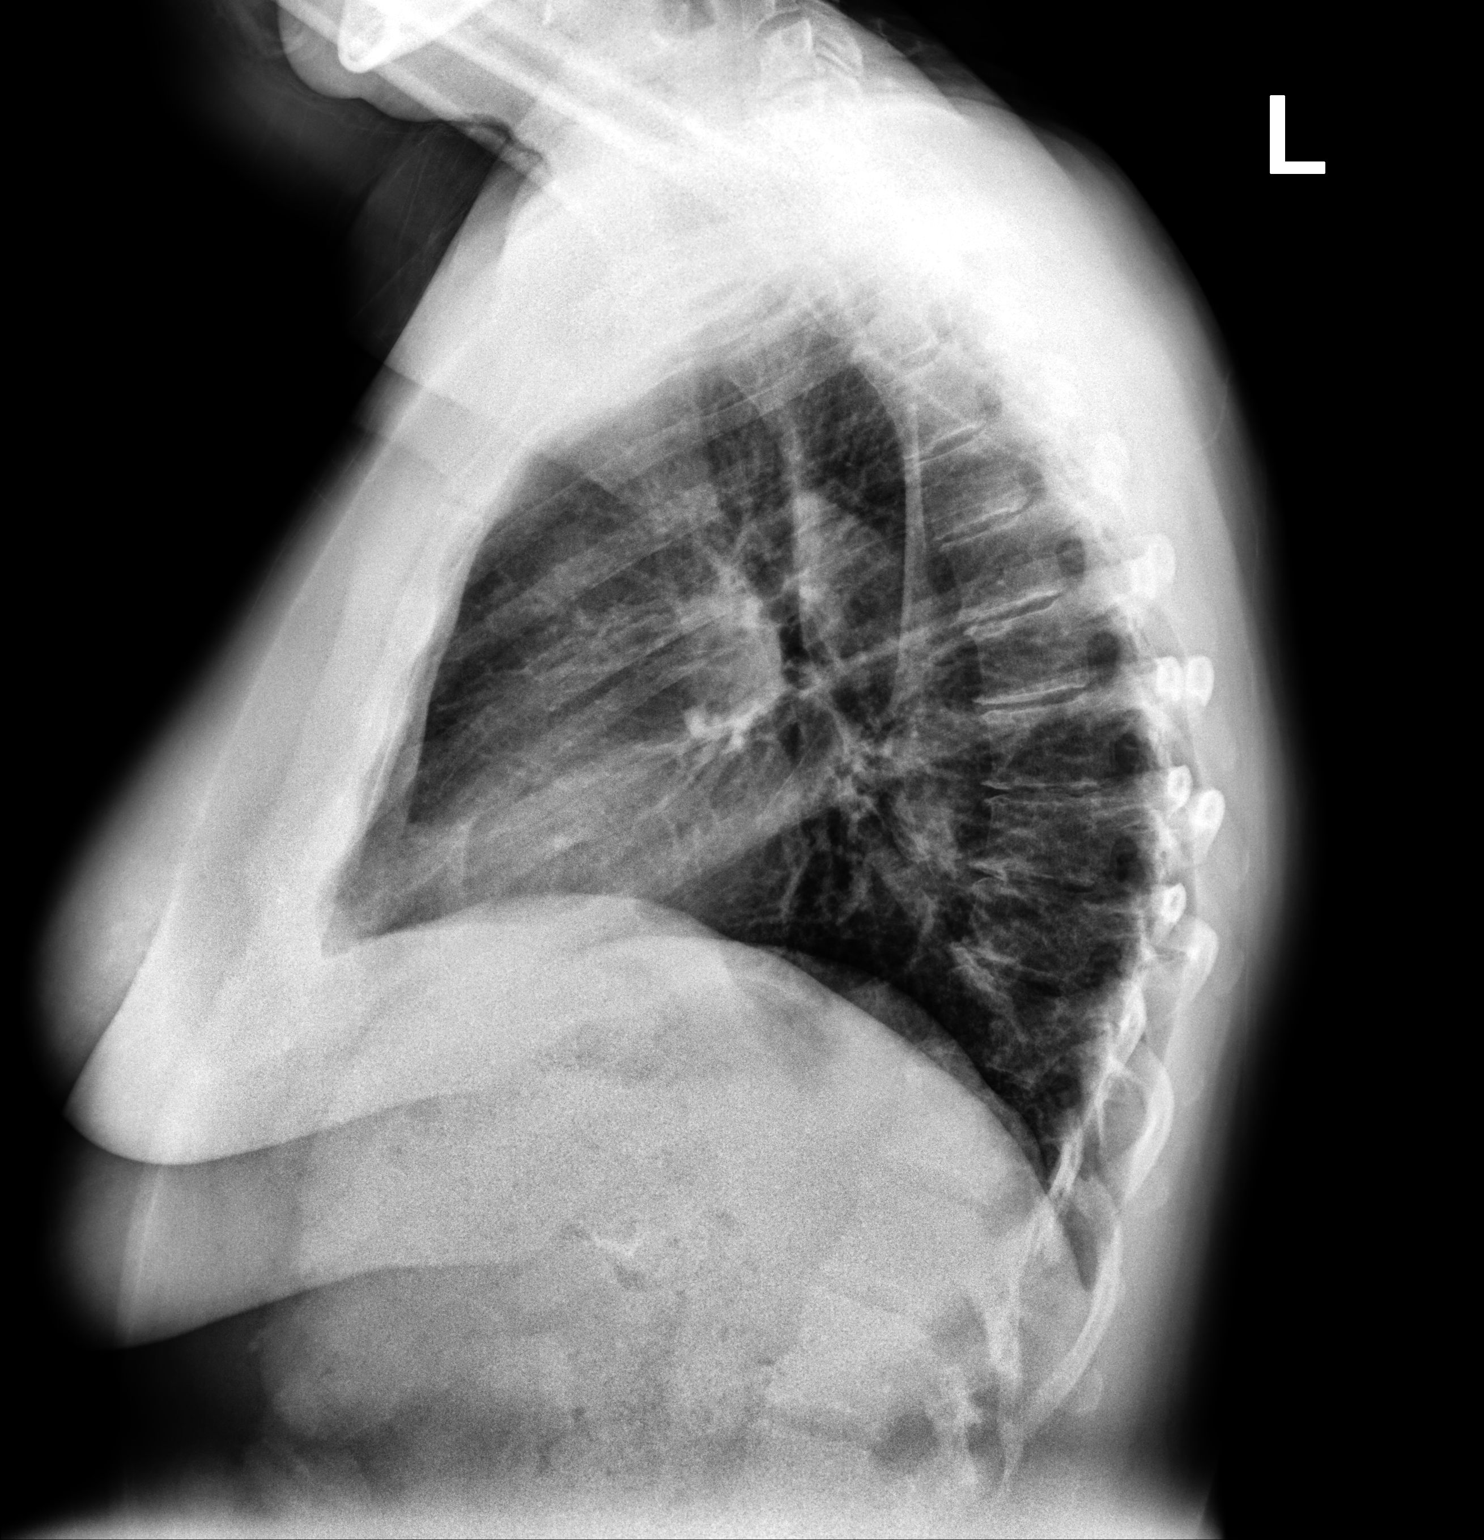

[2 of 2 positions shown; findings below may reference images not displayed]

FINDINGS: The heart size and mediastinal contours are within normal limits.
Mild diffuse interstitial pulmonary opacity. Disc degenerative
disease of the thoracic spine.
IMPRESSION: Mild, diffuse interstitial pulmonary opacity, potentially in keeping
with residual nonspecific infection or edema. No focal airspace
opacity.

## 2021-02-06 ENCOUNTER — Other Ambulatory Visit: Payer: Self-pay | Admitting: Cardiology

## 2021-02-19 NOTE — Progress Notes (Signed)
Cardiology Office Note   Date:  02/21/2021   ID:  Kalyn, Hofstra 18-May-1970, MRN [id number]  PCP:  Ria Bush, MD  Cardiologist:   Minus Breeding, MD   Chief Complaint  Patient presents with   Tachycardia       History of Present Illness: Cassie [name] is a 51 y.o. female who presents for evaluation of dizziness.  She had chest pain in the past.  She had normal coronary arteries on cardiac catheterization in 2006.  Myoview obtained on 02/10/2016 was low risk with EF of 78%, no reversible ischemia or infarction.  Echocardiogram obtained on the same day showed EF 60 to 65%, no significant valve disorder.  The patient was referred to cardiology service for evaluation of palpitation and dizziness.  She was diagnosed with COVID-19 in August 2020.  Since her diagnosis, she has had persistent dizziness upon standing.  She also had racing heartbeat that fluctuate to low heart rate.   Repeat echocardiogram obtained on 02/11/2019 showed EF 60 to 65%, normal pulmonary artery pressure, no significant valve disorder.  3 days ZIO monitor did not show significant arrhythmia. She wore a 14 day monitor and had rare supraventricular beats.  Unfortunately she is having "long haul" symptoms from her Covid infection.   She was seen once in the [facility] clinic.  I treated her with Cardizem CD.  Inflammatory markers including hs trop were negative.   She has an appt with our pulmonary.   They did do a CT without any acute findings.  She did have normal C-reactive protein, sed rate, troponin and BNP.   Since I last saw her she she has continued to have cough.  She still has tachycardia.  Her resting heart rate is running below 100 when she is awake.  He goes up in the 140s when she exercises.  She still doing some supervised exercise any longer unsupervised exercise which she went from them as well.  We will have her off the gabapentin.  She still has breathlessness. .  She is not describing PND or  orthopnea.  She is not having any presyncope or syncope.  She is not having any chest pain.    Past Medical History:  Diagnosis Date   Allergy    Asthma    Cardiac disease    Diffuse cystic mastopathy    GERD (gastroesophageal reflux disease)    Guttate psoriasis    H/O syncope    cards work-up WNL (Jarmel Linhardt) thought 2/2 overmedication vs arrhythmias   Hx of endometriosis    hysterectomy   Hx of migraines    occasional; improved since hysterectomy   Labral tear of shoulder 02/2014   R, pending surgery Tamera Punt)   Rosacea    mild    Past Surgical History:  Procedure Laterality Date   BREAST BIOPSY  2010   left; core biopsy   BREAST EXCISIONAL BIOPSY Bilateral    multiple bilateral   BREAST MASS EXCISION  2008   BREAST SURGERY Right    excision of lesion   CARDIAC CATHETERIZATION  2007   CHOLECYSTECTOMY  2008   COLONOSCOPY  2006   WNL Nicolasa Ducking)   COLONOSCOPY  06/2013   mod diverticulosis, rpt 10 yrs Henrene Pastor)   LAPAROSCOPY  1994   endometriosis   TONSILLECTOMY  1994   TONSILLECTOMY AND ADENOIDECTOMY  1995   TOTAL ABDOMINAL HYSTERECTOMY     Dr Rayford Halsted for endometriosis     Current Outpatient Medications  Medication  Sig Dispense Refill   albuterol (PROVENTIL) (2.5 MG/3ML) 0.083% nebulizer solution Take 3 mLs (2.5 mg total) by nebulization every 6 (six) hours as needed for wheezing or shortness of breath. 150 mL 0   amitriptyline (ELAVIL) 50 MG tablet TAKE 1 TABLET BY MOUTH EVERYDAY AT BEDTIME 90 tablet 2   Biotin 5 MG CAPS Take 1 capsule (5 mg total) by mouth daily.  0   chlorpheniramine (CHLOR-TRIMETON) 4 MG tablet Take by mouth. 4 times daily     cholecalciferol (VITAMIN D3) 25 MCG (1000 UNIT) tablet Take 1,000 Units by mouth daily.     Dextromethorphan Polistirex (DELSYM PO) Take by mouth daily.     diltiazem (CARDIZEM CD) 240 MG 24 hr capsule Take 1 capsule (240 mg total) by mouth daily. 90 capsule 3   famotidine (PEPCID) 20 MG tablet TAKE ONE TABLET BY MOITH AFTER  SUPPER 90 tablet 1   ipratropium (ATROVENT) 0.02 % nebulizer solution Inhale into the lungs. As Needed     pantoprazole (PROTONIX) 40 MG tablet Take 1 tablet (40 mg total) by mouth daily. Take 30-60 min before first meal of the day 90 tablet 3   Respiratory Therapy Supplies (FLUTTER) DEVI Use as directed 1 each 0   No current facility-administered medications for this visit.    Allergies:   Fish allergy, Shellfish allergy, Chocolate, Cocoa, Penicillins, and Tape    ROS:  Please see the history of present illness.   Otherwise, review of systems are positive for joint pains.   All other systems are reviewed and negative.    PHYSICAL EXAM: VS:  BP 118/82    Pulse (!) 107    Ht 5\' 5"  (1.651 m)    Wt 206 lb (93.4 kg)    LMP 01/27/2000    SpO2 98%    BMI 34.28 kg/m  , BMI Body mass index is 34.28 kg/m. GENERAL:  Well appearing NECK:  No jugular venous distention, waveform within normal limits, carotid upstroke brisk and symmetric, no bruits, no thyromegaly LUNGS:  Clear to auscultation bilaterally CHEST:  Unremarkable HEART:  PMI not displaced or sustained,S1 and S2 within normal limits, no S3, no S4, no clicks, no rubs,no murmurs ABD:  Flat, positive bowel sounds normal in frequency in pitch, no bruits, no rebound, no guarding, no midline pulsatile mass, no hepatomegaly, no splenomegaly EXT:  2 plus pulses throughout, no edema, no cyanosis no clubbing   EKG:  EKG is not ordered today. NA   Recent Labs: 05/06/2020: Hemoglobin 14.3; Platelets 237; TSH 2.260 09/01/2020: ALT 38; BUN 18; Creatinine, Ser 1.10; Potassium 4.0; Sodium 139    Lipid Panel    Component Value Date/Time   CHOL 170 09/01/2020 0828   CHOL 158 02/16/2016 0826   TRIG 146.0 09/01/2020 0828   HDL 50.60 09/01/2020 0828   HDL 50 02/16/2016 0826   CHOLHDL 3 09/01/2020 0828   VLDL 29.2 09/01/2020 0828   LDLCALC 90 09/01/2020 0828   LDLCALC 82 02/16/2016 0826      Wt Readings from Last 3 Encounters:  02/21/21 206  lb (93.4 kg)  08/30/20 202 lb 5 oz (91.8 kg)  08/15/20 203 lb 3.2 oz (92.2 kg)      Other studies Reviewed: Additional studies/ records that were reviewed today include:   None Review of the above records demonstrates:  Please see elsewhere in the note.     ASSESSMENT AND PLAN:   PALPITATIONS:    I am going to apply a 3-day ZIO monitor  to make sure she has a low heart rate at least when she is asleep.  Otherwise unwilling to continue the current regimen as she is not having severe symptoms of palpitations and her heart rate still remains high.  COVID:    The hoarseness is slightly improved but she still coughs and she is still hoarse.  She requests follow-up with pulmonary and I will make this redo referral.   Reviewed at length with the patient today.  The patient does not have concerns regarding medicines.  The following changes have been made: None  Labs/ tests ordered today include:   Orders Placed This Encounter  Procedures   Ambulatory referral to Pulmonology   LONG TERM MONITOR (3-14 DAYS)      Disposition:   FU with me in 6 months.    Signed, Minus Breeding, MD  02/21/2021 6:37 PM    [facility]

## 2021-02-21 ENCOUNTER — Other Ambulatory Visit: Payer: Self-pay

## 2021-02-21 ENCOUNTER — Encounter: Payer: Self-pay | Admitting: *Deleted

## 2021-02-21 ENCOUNTER — Encounter: Payer: Self-pay | Admitting: Cardiology

## 2021-02-21 ENCOUNTER — Ambulatory Visit (INDEPENDENT_AMBULATORY_CARE_PROVIDER_SITE_OTHER): Payer: BC Managed Care – PPO | Admitting: Cardiology

## 2021-02-21 ENCOUNTER — Ambulatory Visit (INDEPENDENT_AMBULATORY_CARE_PROVIDER_SITE_OTHER): Payer: BC Managed Care – PPO

## 2021-02-21 VITALS — BP 118/82 | HR 107 | Ht 65.0 in | Wt 206.0 lb

## 2021-02-21 DIAGNOSIS — R002 Palpitations: Secondary | ICD-10-CM

## 2021-02-21 DIAGNOSIS — R051 Acute cough: Secondary | ICD-10-CM | POA: Diagnosis not present

## 2021-02-21 DIAGNOSIS — R0602 Shortness of breath: Secondary | ICD-10-CM | POA: Diagnosis not present

## 2021-02-21 NOTE — Patient Instructions (Signed)
Testing/Procedures:  Cassie [name]- Long Term Monitor Instructions  Your physician has requested you wear a ZIO patch monitor for 3 days.  This is a single patch monitor. [employer] supplies one patch monitor per enrollment. Additional stickers are not available. Please do not apply patch if you will be having a Nuclear Stress Test,  Echocardiogram, Cardiac CT, MRI, or Chest Xray during the period you would be wearing the  monitor. The patch cannot be worn during these tests. You cannot remove and re-apply the  ZIO XT patch monitor.  Your ZIO patch monitor will be mailed 3 day USPS to your address on file. It may take 3-5 days  to receive your monitor after you have been enrolled.  Once you have received your monitor, please review the enclosed instructions. Your monitor  has already been registered assigning a specific monitor serial # to you.  Billing and Patient Assistance Program Information  We have supplied [employer] with any of your insurance information on file for billing purposes. [employer] offers a sliding scale Patient Assistance Program for patients that do not have  insurance, or whose insurance does not completely cover the cost of the ZIO monitor.  You must apply for the Patient Assistance Program to qualify for this discounted rate.  To apply, please call [employer] at [phone], select option 4, select option 2, ask to apply for  Patient Assistance Program. Cassie [name] will ask your household income, and how many people  are in your household. They will quote your out-of-pocket cost based on that information.  [employer] will also be able to set up a 53-month, interest-free payment plan if needed.  Applying the monitor   Shave hair from upper left chest.  Hold abrader disc by orange tab. Rub abrader in 40 strokes over the upper left chest as  indicated in your monitor instructions.  Clean area with 4 enclosed alcohol pads. Let dry.  Apply patch as indicated in monitor instructions.  Patch will be placed under collarbone on left  side of chest with arrow pointing upward.  Rub patch adhesive wings for 2 minutes. Remove white label marked "1". Remove the white  label marked "2". Rub patch adhesive wings for 2 additional minutes.  While looking in a mirror, press and release button in center of patch. A small green light will  flash 3-4 times. This will be your only indicator that the monitor has been turned on.  Do not shower for the first 24 hours. You may shower after the first 24 hours.  Press the button if you feel a symptom. You will hear a small click. Record Date, Time and  Symptom in the Patient Logbook.  When you are ready to remove the patch, follow instructions on the last 2 pages of Patient  Logbook. Stick patch monitor onto the last page of Patient Logbook.  Place Patient Logbook in the blue and white box. Use locking tab on box and tape box closed  securely. The blue and white box has prepaid postage on it. Please place it in the mailbox as  soon as possible. Your physician should have your test results approximately 7 days after the  monitor has been mailed back to [facility].  Call [place] at [phone] if you have questions regarding  your ZIO XT patch monitor. Call them immediately if you see an orange light blinking on your  monitor.  If your monitor falls off in less than 4 days, contact our Monitor department at [phone].  If your monitor becomes loose or falls off after 4 days call [employer] at [phone] for  suggestions on securing your monitor    Follow-Up: At [facility], you and your health needs are our priority.  As part of our continuing mission to provide you with exceptional heart care, we have created designated Provider Care Teams.  These Care Teams include your primary Cardiologist (physician) and Advanced Practice Providers (APPs -  Physician Assistants and Nurse Practitioners) who all work together to  provide you with the care you need, when you need it.  We recommend signing up for the patient portal called "MyChart".  Sign up information is provided on this After Visit Summary.  MyChart is used to connect with patients for Virtual Visits (Telemedicine).  Patients are able to view lab/test results, encounter notes, upcoming appointments, etc.  Non-urgent messages can be sent to your provider as well.   To learn more about what you can do with MyChart, go to [email or web address].    Your next appointment:   6 month(s)  The format for your next appointment:   In Person  Provider:   Minus Breeding, MD

## 2021-02-21 NOTE — Progress Notes (Unsigned)
Enrolled patient for a 3 day Zio XT monitor to be mailed to patients home  

## 2021-02-25 DIAGNOSIS — R002 Palpitations: Secondary | ICD-10-CM

## 2021-03-08 DIAGNOSIS — R002 Palpitations: Secondary | ICD-10-CM | POA: Diagnosis not present

## 2021-03-21 ENCOUNTER — Ambulatory Visit: Payer: BC Managed Care – PPO | Admitting: Pulmonary Disease

## 2021-03-21 ENCOUNTER — Encounter: Payer: Self-pay | Admitting: Pulmonary Disease

## 2021-03-21 ENCOUNTER — Other Ambulatory Visit: Payer: Self-pay

## 2021-03-21 ENCOUNTER — Encounter: Payer: Self-pay | Admitting: *Deleted

## 2021-03-21 VITALS — BP 128/76 | HR 75 | Temp 98.3°F | Ht 65.0 in | Wt 204.6 lb

## 2021-03-21 DIAGNOSIS — R490 Dysphonia: Secondary | ICD-10-CM | POA: Diagnosis not present

## 2021-03-21 DIAGNOSIS — Z8616 Personal history of COVID-19: Secondary | ICD-10-CM

## 2021-03-21 NOTE — Patient Instructions (Signed)
We will refer you back to Dr. Joya Gaskins, ENT at [facility] as you continue to have persistent symptoms with voice and hoarseness ?I will also make referral to [facility] pulmonology for second opinion ? ?Follow-up in 3 to 4 months. ?

## 2021-03-21 NOTE — Progress Notes (Signed)
? ?      ?Cassie [name]    [id number]    [birth year] ? ?Primary Care Physician:Gutierrez, Garlon Hatchet, MD ? ?Referring Physician: Minus Breeding, MD ?[address] ?[address] ?[place],  [place] [address] ? ?Chief complaint: Follow-up for chronic cough, post Covid ? ?HPI: ?51 year old with allergies, asthma, Covid infection in August 2020 ?Has prolonged long-haul Covid presenting as cough, fatigue, exertion, hoarseness of voice, cough.  She is seen multiple pulmonary providers including Dr. Melvyn Novas in this office, [facility] and [facility] with negative high-res CT in December 2020, PFTs at [facility] pulmonary was reportedly normal per report.  She had a borderline elevated D-dimer in early July at [facility] and CTA showed mild groundglass opacities with focal fibrosis [report below] ? ?Has some response from high-dose steroids in the past.  Inhalers have not helped.  She has been treated well for postnasal drip, GERD without any change, also evaluated by Dr. Joya Gaskins ENT at [facility] for vocal cord issues.  Laryngoscopy showed mild laryngeal edema and complete closure.  She is is being treated with Elavil, Neurontin.  Also finished treatment with speech therapy at [facility] for irritable larynx with some improvement in hoarseness of voice. ? ?She follows with Dr. Percival Spanish, cardiology for palpitations and has been started on calcium channel blocker ?She had a recrudescence of symptoms after her first dose of COVID-19 vaccine with increasing cough, chest tightness, fevers.  She was evaluated in the ED and given prednisone which improved symptoms chest tightness but she continues to have significant cough with hoarseness ? ?Has been evaluated by neurology for brain fog.  Did not require additional treatment ? ?Pets: No pets ?Occupation: Works as a Retail buyer ?Exposures: No known exposures.  No mold, hot tub, Jacuzzi.  No feather pillows or comforters ?Smoking history: Never smoker ?Travel history:  Originally from [place]. ?Relevant family history: No significant family issue of lung disease. ? ?Interim history: ?Finished speech therapy at [facility] in 2021.  Continues to have chronic cough, stridor, change in voice ?She is frustrated with the lack of progress and recurrent symptoms. ?Off neurontin since Dec 2022 ? ? ?Outpatient Encounter Medications as of 03/21/2021  ?Medication Sig  ? albuterol (PROVENTIL) (2.5 MG/3ML) 0.083% nebulizer solution Take 3 mLs (2.5 mg total) by nebulization every 6 (six) hours as needed for wheezing or shortness of breath.  ? amitriptyline (ELAVIL) 50 MG tablet TAKE 1 TABLET BY MOUTH EVERYDAY AT BEDTIME  ? Biotin 5 MG CAPS Take 1 capsule (5 mg total) by mouth daily.  ? chlorpheniramine (CHLOR-TRIMETON) 4 MG tablet Take by mouth. 4 times daily  ? cholecalciferol (VITAMIN D3) 25 MCG (1000 UNIT) tablet Take 1,000 Units by mouth daily.  ? Dextromethorphan Polistirex (DELSYM PO) Take by mouth daily.  ? diltiazem (CARDIZEM CD) 240 MG 24 hr capsule Take 1 capsule (240 mg total) by mouth daily.  ? famotidine (PEPCID) 20 MG tablet TAKE ONE TABLET BY MOITH AFTER SUPPER  ? ipratropium (ATROVENT) 0.02 % nebulizer solution Inhale into the lungs. As Needed  ? pantoprazole (PROTONIX) 40 MG tablet Take 1 tablet (40 mg total) by mouth daily. Take 30-60 min before first meal of the day  ? Respiratory Therapy Supplies (FLUTTER) DEVI Use as directed  ? ?No facility-administered encounter medications on file as of 03/21/2021.  ? ? ?Physical Exam: ?Blood pressure 128/76, pulse 75, temperature 98.3 ?F (36.8 ?C), temperature source Oral, height '5\' 5"'$  (1.651 m), weight 204 lb 9.6 oz (92.8  kg), last menstrual period 01/27/2000, SpO2 100 %. ?Gen:      No acute distress ?HEENT:  EOMI, sclera anicteric ?Neck:     No masses; no thyromegaly ?Lungs:    Clear to auscultation bilaterally; normal respiratory effort ?CV:         Regular rate and rhythm; no murmurs ?Abd:      + bowel sounds; soft, non-tender; no  palpable masses, no distension ?Ext:    No edema; adequate peripheral perfusion ?Skin:      Warm and dry; no rash ?Neuro: alert and oriented x 3 ?Psych: normal mood and affect  ? ?Data Reviewed: ?Imaging: ?High-resolution CT 12/10/2018-no evidence of fibrotic interstitial lung disease, mild air trapping ? ?CTA report from [facility] 07/10/2019-Bilateral lower lobe dependent heterogenous ground glass opacities, favoring dependent subsegmental atelectasis. Focal ground glass opacity involving the medial basal segment of right lower lobe, favoring fibrosis. No suspicious pulmonary nodule. No pleural effusion or pneumothorax.  ? ?High-res CT 08/07/2019-no interstitial lung disease, mild air trapping, mild centrilobular nodularity in the apices ? ?Chest x ray 10/17/2020- No acute cardiopulmonary ab ?I have reviewed the images personally. ? ?PFTs: ?08/21/2019 ?FVC 3.49 [93%], FEV1 2.92 [97%], F/F 84, TLC 5.51 [105%], DLCO 24.90 [112%] ?Normal test ? ?Labs: ?CBC 03/17/2019-WBC 8.6, eos 2.2%, absolute eosinophil count 189 ?IgE 03/17/2019-14 ? ?Sed rate 06/18/2019-14 ? ?Assessment:  ?Long-haul Covid ?Atypical presentation with mostly vocal cord issues, dysphonia, irritable larynx. ?Symptoms have not responded to high-dose steroids, treatment for postnasal drip, acid reflux, inhalers.  She has finished multiple rounds of pulmonary rehab. ? ?CT imaging does not show any interstitial lung disease and PFTs are normal.  I am not sure if there is much else we can offer her for her persistent symptoms and she is frustrated with the lack of progress ? ?We will refer her back to Dr. Joya Gaskins, ENT at [facility] for evaluation and asked [facility] to give a second opinion. ? ?GERD ?On Protonix, Pepcid ? ?Plan/Recommendations: ?Continue Protonix, Pepcid ?Referral to ENT and [facility] ? ?Marshell Garfinkel MD ?[facility] ?03/21/2021, 3:54 PM ? ?CC: Minus Breeding, MD ? ? ?

## 2021-03-28 ENCOUNTER — Other Ambulatory Visit: Payer: Self-pay

## 2021-03-28 ENCOUNTER — Ambulatory Visit: Payer: BC Managed Care – PPO | Admitting: Family Medicine

## 2021-03-28 ENCOUNTER — Encounter: Payer: Self-pay | Admitting: Family Medicine

## 2021-03-28 VITALS — BP 120/80 | HR 108 | Temp 98.0°F | Ht 65.0 in | Wt 203.1 lb

## 2021-03-28 DIAGNOSIS — E559 Vitamin D deficiency, unspecified: Secondary | ICD-10-CM

## 2021-03-28 DIAGNOSIS — R4189 Other symptoms and signs involving cognitive functions and awareness: Secondary | ICD-10-CM

## 2021-03-28 DIAGNOSIS — R490 Dysphonia: Secondary | ICD-10-CM

## 2021-03-28 DIAGNOSIS — U099 Post covid-19 condition, unspecified: Secondary | ICD-10-CM

## 2021-03-28 DIAGNOSIS — R002 Palpitations: Secondary | ICD-10-CM

## 2021-03-28 DIAGNOSIS — R053 Chronic cough: Secondary | ICD-10-CM

## 2021-03-28 DIAGNOSIS — K219 Gastro-esophageal reflux disease without esophagitis: Secondary | ICD-10-CM

## 2021-03-28 MED ORDER — BUDESONIDE 0.5 MG/2ML IN SUSP: 0.5000 mg | Freq: Every day | RESPIRATORY_TRACT | 0 refills | Status: DC

## 2021-03-28 NOTE — Progress Notes (Signed)
? ? Patient ID: Cassie [name], female    DOB: [birth year], 51 y.o.   MRN: [id number] ? ?This visit was conducted in person. ? ?BP 120/80   Pulse (!) 108   Temp 98 ?F (36.7 ?C) (Temporal)   Ht '5\' 5"'$  (1.651 m)   Wt 203 lb 2 oz (92.1 kg)   LMP 01/27/2000   SpO2 97%   BMI 33.80 kg/m?   ? ?CC: long COVID ?Subjective:  ? ?HPI: ?Cassie [name] is a 51 y.o. female presenting on 03/28/2021 for Follow-up (Here for f/u of ongoing cough and elevated HR since COVID 2 yrs ago. ) ? ? ?COVID-19 bronchitis 08/2018.  ?Long hauler symptoms of chronic fatigue, chronic cough and hoarseness, chronic tachycardia since then.  ?She has seen pulmonologist, ENT, and completed rehab through [place] [facility] for this. She also underwent speech therapy at [facility].  ? ?Current regimen is amitriptyline '50mg'$  nightly, chlorpheniramine '4mg'$  QID, dextromethorphan PRN, diltiazem '250mg'$  daily, protonix '40mg'$  daily, pepcid '20mg'$  nightly, albuterol inh/neb and atrovent nebulizer PRN. Also continues biotin regularly.  ?She slowly tapered off gabapentin 12/2020 - no benefit on this.  ?Oral prednisone beneficial but concern over long term effect.  ?Notes mask use can exacerbate symptoms ? ?Saw cardiology ([facility]) last month - s/p unrevealing 3d zio patch: ?Normal sinus rhythm ?Sinus tachycardia noted ?Premature atrial contractions ?No sustained arrhythmias ? ?Saw pulmonology [facility]) last week - atypical presentation with mostly vocal cord issues with irritable larynx and dysphonia - unsure what else to offer - referred back to Dr Irving Copas ENT at [facility] and back to [facility] for second opinion.  ? ?Has plateaued in improvement - this is very frustrating for her and for work.  ?She continues working full time - from home, occasionally going into work.  ?   ? ?Relevant past medical, surgical, family and social history reviewed and updated as indicated. Interim medical history since our last visit reviewed. ?Allergies and medications reviewed and updated. ?Outpatient  Medications Prior to Visit  ?Medication Sig Dispense Refill  ? albuterol (PROVENTIL) (2.5 MG/3ML) 0.083% nebulizer solution Take 3 mLs (2.5 mg total) by nebulization every 6 (six) hours as needed for wheezing or shortness of breath. 150 mL 0  ? amitriptyline (ELAVIL) 50 MG tablet TAKE 1 TABLET BY MOUTH EVERYDAY AT BEDTIME 90 tablet 2  ? Biotin 5 MG CAPS Take 1 capsule (5 mg total) by mouth daily.  0  ? chlorpheniramine (CHLOR-TRIMETON) 4 MG tablet Take by mouth. 4 times daily    ? cholecalciferol (VITAMIN D3) 25 MCG (1000 UNIT) tablet Take 1,000 Units by mouth daily.    ? Dextromethorphan Polistirex (DELSYM PO) Take by mouth daily.    ? diltiazem (CARDIZEM CD) 240 MG 24 hr capsule Take 1 capsule (240 mg total) by mouth daily. 90 capsule 3  ? famotidine (PEPCID) 20 MG tablet TAKE ONE TABLET BY MOITH AFTER SUPPER 90 tablet 1  ? ipratropium (ATROVENT) 0.02 % nebulizer solution Inhale into the lungs. As Needed    ? pantoprazole (PROTONIX) 40 MG tablet Take 1 tablet (40 mg total) by mouth daily. Take 30-60 min before first meal of the day 90 tablet 3  ? Respiratory Therapy Supplies (FLUTTER) DEVI Use as directed 1 each 0  ? ?No facility-administered medications prior to visit.  ?  ? ?Per HPI unless specifically indicated in ROS section below ?Review of Systems ? ?Objective:  ?BP 120/80   Pulse (!) 108   Temp 98 ?F (36.7 ?C) (Temporal)  Ht '5\' 5"'$  (1.651 m)   Wt 203 lb 2 oz (92.1 kg)   LMP 01/27/2000   SpO2 97%   BMI 33.80 kg/m?   ?Wt Readings from Last 3 Encounters:  ?03/28/21 203 lb 2 oz (92.1 kg)  ?03/21/21 204 lb 9.6 oz (92.8 kg)  ?02/21/21 206 lb (93.4 kg)  ?  ?  ?Physical Exam ?Vitals and nursing note reviewed.  ?Constitutional:   ?   Appearance: Normal appearance. She is obese. She is not ill-appearing.  ?HENT:  ?   Head: Normocephalic and atraumatic.  ?   Mouth/Throat:  ?   Mouth: Mucous membranes are moist.  ?   Pharynx: Oropharynx is clear. No oropharyngeal exudate or posterior oropharyngeal erythema.   ?Eyes:  ?   Extraocular Movements: Extraocular movements intact.  ?   Pupils: Pupils are equal, round, and reactive to light.  ?Cardiovascular:  ?   Rate and Rhythm: Regular rhythm. Tachycardia present.  ?   Pulses: Normal pulses.  ?   Heart sounds: Normal heart sounds. No murmur heard. ?Pulmonary:  ?   Effort: Pulmonary effort is normal. No respiratory distress.  ?   Breath sounds: Normal breath sounds. No wheezing, rhonchi or rales.  ?Musculoskeletal:  ?   Right lower leg: No edema.  ?   Left lower leg: No edema.  ?Skin: ?   General: Skin is warm and dry.  ?   Findings: No rash.  ?Neurological:  ?   Mental Status: She is alert.  ?Psychiatric:     ?   Mood and Affect: Mood normal.     ?   Behavior: Behavior normal.  ?   Comments: Frustrated with course of long COVID symptoms  ? ?   ?Results for orders placed or performed in visit on 09/01/20  ?Ferritin  ?Result Value Ref Range  ? Ferritin 163.1 10.0 - 291.0 ng/mL  ?VITAMIN D 25 Hydroxy (Vit-D Deficiency, Fractures)  ?Result Value Ref Range  ? VITD 32.83 30.00 - 100.00 ng/mL  ?Vitamin B12  ?Result Value Ref Range  ? Vitamin B-12 470 211 - 911 pg/mL  ?Comprehensive metabolic panel  ?Result Value Ref Range  ? Sodium 139 135 - 145 mEq/L  ? Potassium 4.0 3.5 - 5.1 mEq/L  ? Chloride 103 96 - 112 mEq/L  ? CO2 28 19 - 32 mEq/L  ? Glucose, Bld 94 70 - 99 mg/dL  ? BUN 18 6 - 23 mg/dL  ? Creatinine, Ser 1.10 0.40 - 1.20 mg/dL  ? Total Bilirubin 0.6 0.2 - 1.2 mg/dL  ? Alkaline Phosphatase 105 39 - 117 U/L  ? AST 23 0 - 37 U/L  ? ALT 38 (H) 0 - 35 U/L  ? Total Protein 7.4 6.0 - 8.3 g/dL  ? Albumin 4.4 3.5 - 5.2 g/dL  ? GFR 59.76 (L) >60.00 mL/min  ? Calcium 9.9 8.4 - 10.5 mg/dL  ?Lipid panel  ?Result Value Ref Range  ? Cholesterol 170 0 - 200 mg/dL  ? Triglycerides 146.0 0.0 - 149.0 mg/dL  ? HDL 50.60 >39.00 mg/dL  ? VLDL 29.2 0.0 - 40.0 mg/dL  ? LDL Cholesterol 90 0 - 99 mg/dL  ? Total CHOL/HDL Ratio 3   ? NonHDL 119.69   ? ? ?Assessment & Plan:  ?This visit occurred during  the SARS-CoV-2 public health emergency.  Safety protocols were in place, including screening questions prior to the visit, additional usage of staff PPE, and extensive cleaning of exam room while observing appropriate contact  time as indicated for disinfecting solutions.  ? ?Problem List Items Addressed This Visit   ? ? Vitamin D deficiency  ?  Continue vit D3 1000 IU daily.  ?  ?  ? 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  Chronic, ongoing since COVID-19 infection in August 2020.  Long-hauler symptoms manifesting as chronic fatigue, chronic cough with hoarseness and easy tachycardia. She understandably remains frustrated at plateaued improvement in symptoms. She has seen multiple specialists as well as long-hauler clinic.  Current plan is return to ENT at [facility] and [facility] for second opinion. ?In interim, continue regimen including amitriptyline diltiazem pantoprazole and Pepcid with albuterol and Atrovent nebs.  Also continue dextromethorphan as needed and antihistamine- she may switch this as needed. ?Discussed possibly trying supplements that may have benefit-start with B complex vitamin.  We will also try nebulized Pulmicort for ongoing cough and shortness of breath. ?I had asked her to let me know if she needs any updated forms to remain working from home. ?  ?  ? Palpitations  ?  Followed by cardiology.  This is improved some since starting diltiazem. ?  ?  ? Brain fog  ? Hoarseness of voice  ? Chronic cough  ? GERD (gastroesophageal reflux disease)  ?  ? ?Meds ordered this encounter  ?Medications  ? budesonide (PULMICORT) 0.5 MG/2ML nebulizer solution  ?  Sig: Take 2 mLs (0.5 mg total) by nebulization daily.  ?  Dispense:  60 mL  ?  Refill:  0  ? ?No orders of the defined types were placed in this encounter. ? ? ? ?Patient Instructions  ?Try daily pulmicort nebulizer - update [place] with effect.  ?Start B complex vitamin daily for 1 month and monitor effect.  ?Return in 6 months for physical ? ?Follow up  plan: ?Return in about 6 months (around 09/28/2021), or if symptoms worsen or fail to improve, for annual exam, prior fasting for blood work. ? ?Ria Bush, MD   ?

## 2021-03-28 NOTE — Patient Instructions (Addendum)
Try daily pulmicort nebulizer - update [place] with effect.  ?Start B complex vitamin daily for 1 month and monitor effect.  ?Return in 6 months for physical ?

## 2021-03-29 NOTE — Assessment & Plan Note (Signed)
Continue vit D3 1000 IU daily.  ?

## 2021-03-29 NOTE — Assessment & Plan Note (Signed)
Followed by cardiology.  This is improved some since starting diltiazem. ?

## 2021-03-29 NOTE — Assessment & Plan Note (Addendum)
Chronic, ongoing since COVID-19 infection in August 2020.  Long-hauler symptoms manifesting as chronic fatigue, chronic cough with hoarseness and easy tachycardia. She understandably remains frustrated at plateaued improvement in symptoms. She has seen multiple specialists as well as long-hauler clinic.  Current plan is return to ENT at [facility] and [facility] for second opinion. ?In interim, continue regimen including amitriptyline diltiazem pantoprazole and Pepcid with albuterol and Atrovent nebs.  Also continue dextromethorphan as needed and antihistamine- she may switch this as needed. ?Discussed possibly trying supplements that may have benefit-start with B complex vitamin.  We will also try nebulized Pulmicort for ongoing cough and shortness of breath. ?I had asked her to let me know if she needs any updated forms to remain working from home. ?

## 2021-04-24 ENCOUNTER — Other Ambulatory Visit: Payer: Self-pay | Admitting: Family Medicine

## 2021-05-23 ENCOUNTER — Other Ambulatory Visit: Payer: Self-pay | Admitting: Family Medicine

## 2021-05-31 ENCOUNTER — Other Ambulatory Visit: Payer: Self-pay

## 2021-05-31 DIAGNOSIS — R058 Other specified cough: Secondary | ICD-10-CM

## 2021-05-31 MED ORDER — PANTOPRAZOLE SODIUM 40 MG PO TBEC: 40.0000 mg | DELAYED_RELEASE_TABLET | Freq: Every day | ORAL | 3 refills | Status: DC

## 2021-06-07 ENCOUNTER — Other Ambulatory Visit: Payer: Self-pay | Admitting: Family Medicine

## 2021-06-07 NOTE — Telephone Encounter (Signed)
Ok to send 90-day rx, per pharmacy request? 

## 2021-06-08 NOTE — Telephone Encounter (Signed)
ERx 

## 2021-06-13 DIAGNOSIS — R053 Chronic cough: Secondary | ICD-10-CM | POA: Diagnosis not present

## 2021-06-13 DIAGNOSIS — R499 Unspecified voice and resonance disorder: Secondary | ICD-10-CM | POA: Diagnosis not present

## 2021-06-13 DIAGNOSIS — U099 Post covid-19 condition, unspecified: Secondary | ICD-10-CM | POA: Diagnosis not present

## 2021-06-21 ENCOUNTER — Ambulatory Visit: Payer: BC Managed Care – PPO | Admitting: Pulmonary Disease

## 2021-06-28 DIAGNOSIS — F444 Conversion disorder with motor symptom or deficit: Secondary | ICD-10-CM | POA: Diagnosis not present

## 2021-06-28 DIAGNOSIS — R053 Chronic cough: Secondary | ICD-10-CM | POA: Diagnosis not present

## 2021-07-14 ENCOUNTER — Other Ambulatory Visit: Payer: Self-pay | Admitting: Pulmonary Disease

## 2021-07-14 DIAGNOSIS — R058 Other specified cough: Secondary | ICD-10-CM

## 2021-07-19 DIAGNOSIS — R053 Chronic cough: Secondary | ICD-10-CM | POA: Diagnosis not present

## 2021-07-19 DIAGNOSIS — U099 Post covid-19 condition, unspecified: Secondary | ICD-10-CM | POA: Diagnosis not present

## 2021-07-19 DIAGNOSIS — R499 Unspecified voice and resonance disorder: Secondary | ICD-10-CM | POA: Diagnosis not present

## 2021-07-19 DIAGNOSIS — R0602 Shortness of breath: Secondary | ICD-10-CM | POA: Diagnosis not present

## 2021-08-01 ENCOUNTER — Ambulatory Visit (INDEPENDENT_AMBULATORY_CARE_PROVIDER_SITE_OTHER): Payer: BC Managed Care – PPO | Admitting: Dermatology

## 2021-08-01 DIAGNOSIS — L578 Other skin changes due to chronic exposure to nonionizing radiation: Secondary | ICD-10-CM | POA: Diagnosis not present

## 2021-08-01 DIAGNOSIS — Z1283 Encounter for screening for malignant neoplasm of skin: Secondary | ICD-10-CM | POA: Diagnosis not present

## 2021-08-01 DIAGNOSIS — L814 Other melanin hyperpigmentation: Secondary | ICD-10-CM

## 2021-08-01 DIAGNOSIS — D1800 Hemangioma unspecified site: Secondary | ICD-10-CM

## 2021-08-01 DIAGNOSIS — L821 Other seborrheic keratosis: Secondary | ICD-10-CM

## 2021-08-01 DIAGNOSIS — D225 Melanocytic nevi of trunk: Secondary | ICD-10-CM | POA: Diagnosis not present

## 2021-08-01 DIAGNOSIS — L719 Rosacea, unspecified: Secondary | ICD-10-CM

## 2021-08-01 DIAGNOSIS — D229 Melanocytic nevi, unspecified: Secondary | ICD-10-CM

## 2021-08-01 MED ORDER — METRONIDAZOLE 0.75 % EX CREA: TOPICAL_CREAM | CUTANEOUS | 11 refills | Status: DC

## 2021-08-01 NOTE — Patient Instructions (Addendum)
Rosacea  What is rosacea? Rosacea (say: ro-zay-sha) is a common skin disease that usually begins as a trend of flushing or blushing easily.  As rosacea progresses, a persistent redness in the center of the face will develop and may gradually spread beyond the nose and cheeks to the forehead and chin.  In some cases, the ears, chest, and back could be affected.  Rosacea may appear as tiny blood vessels or small red bumps that occur in crops.  Frequently they can contain pus, and are called "pustules".  If the bumps do not contain pus, they are referred to as "papules".  Rarely, in prolonged, untreated cases of rosacea, the oil glands of the nose and cheeks may become permanently enlarged.  This is called rhinophyma, and is seen more frequently in men.  Signs and Risks In its beginning stages, rosacea tends to come and go, which makes it difficult to recognize.  It can start as intermittent flushing of the face.  Eventually, blood vessels may become permanently visible.  Pustules and papules can appear, but can be mistaken for adult acne.  People of all races, ages, genders and ethnic groups are at risk of developing rosacea.  However, it is more common in women (especially around menopause) and adults with fair skin between the ages of 3 and 87.  Treatment Dermatologists typically recommend a combination of treatments to effectively manage rosacea.  Treatment can improve symptoms and may stop the progression of the rosacea.  Treatment may involve both topical and oral medications.  The tetracycline antibiotics are often used for their anti-inflammatory effect; however, because of the possibility of developing antibiotic resistance, they should not be used long term at full dose.  For dilated blood vessels the options include electrodessication (uses electric current through a small needle), laser treatment, and cosmetics to hide the redness.   With all forms of treatment, improvement is a slow process, and  patients may not see any results for the first 3-4 weeks.  It is very important to avoid the sun and other triggers.  Patients must wear sunscreen daily.  Skin Care Instructions: Cleanse the skin with a mild soap such as CeraVe cleanser, Cetaphil cleanser, or Dove soap once or twice daily as needed. Moisturize with Eucerin Redness Relief Daily Perfecting Lotion (has a subtle green tint), CeraVe Moisturizing Cream, or Oil of Olay Daily Moisturizer with sunscreen every morning and/or night as recommended. Makeup should be "non-comedogenic" (won't clog pores) and be labeled "for sensitive skin". Good choices for cosmetics are: Neutrogena, Almay, and Physician's Formula.  Any product with a green tint tends to offset a red complexion. If your eyes are dry and irritated, use artificial tears 2-3 times per day and cleanse the eyelids daily with baby shampoo.  Have your eyes examined at least every 2 years.  Be sure to tell your eye doctor that you have rosacea. Alcoholic beverages tend to cause flushing of the skin, and may make rosacea worse. Always wear sunscreen, protect your skin from extreme hot and cold temperatures, and avoid spicy foods, hot drinks, and mechanical irritation such as rubbing, scrubbing, or massaging the face.  Avoid harsh skin cleansers, cleansing masks, astringents, and exfoliation. If a particular product burns or makes your face feel tight, then it is likely to flare your rosacea. If you are having difficulty finding a sunscreen that you can tolerate, you may try switching to a chemical-free sunscreen.  These are ones whose active ingredient is zinc oxide or titanium dioxide  only.  They should also be fragrance free, non-comedogenic, and labeled for sensitive skin. Rosacea triggers may vary from person to person.  There are a variety of foods that have been reported to trigger rosacea.  Some patients find that keeping a diary of what they were doing when they flared helps them avoid  triggers.   Melanoma ABCDEs  Melanoma is the most dangerous type of skin cancer, and is the leading cause of death from skin disease.  You are more likely to develop melanoma if you: Have light-colored skin, light-colored eyes, or red or blond hair Spend a lot of time in the sun Tan regularly, either outdoors or in a tanning bed Have had blistering sunburns, especially during childhood Have a close family member who has had a melanoma Have atypical moles or large birthmarks  Early detection of melanoma is key since treatment is typically straightforward and cure rates are extremely high if we catch it early.   The first sign of melanoma is often a change in a mole or a new dark spot.  The ABCDE system is a way of remembering the signs of melanoma.  A for asymmetry:  The two halves do not match. B for border:  The edges of the growth are irregular. C for color:  A mixture of colors are present instead of an even brown color. D for diameter:  Melanomas are usually (but not always) greater than 6mm - the size of a pencil eraser. E for evolution:  The spot keeps changing in size, shape, and color.  Please check your skin once per month between visits. You can use a small mirror in front and a large mirror behind you to keep an eye on the back side or your body.   If you see any new or changing lesions before your next follow-up, please call to schedule a visit.  Please continue daily skin protection including broad spectrum sunscreen SPF 30+ to sun-exposed areas, reapplying every 2 hours as needed when you're outdoors.   Staying in the shade or wearing long sleeves, sun glasses (UVA+UVB protection) and wide brim hats (4-inch brim around the entire circumference of the hat) are also recommended for sun protection.      Due to recent changes in healthcare laws, you may see results of your pathology and/or laboratory studies on MyChart before the doctors have had a chance to review them. We  understand that in some cases there may be results that are confusing or concerning to you. Please understand that not all results are received at the same time and often the doctors may need to interpret multiple results in order to provide you with the best plan of care or course of treatment. Therefore, we ask that you please give us 2 business days to thoroughly review all your results before contacting the office for clarification. Should we see a critical lab result, you will be contacted sooner.   If You Need Anything After Your Visit  If you have any questions or concerns for your doctor, please call our main line at [phone] and press option 4 to reach your doctor's medical assistant. If no one answers, please leave a voicemail as directed and we will return your call as soon as possible. Messages left after 4 pm will be answered the following business day.   You may also send us a message via MyChart. We typically respond to MyChart messages within 1-2 business days.  For prescription refills, please ask your   pharmacy to contact our office. Our fax number is [phone].  If you have an urgent issue when the clinic is closed that cannot wait until the next business day, you can page your doctor at the number below.    Please note that while we do our best to be available for urgent issues outside of office hours, we are not available 24/7.   If you have an urgent issue and are unable to reach us, you may choose to seek medical care at your doctor's office, retail clinic, urgent care center, or emergency room.  If you have a medical emergency, please immediately call 911 or go to the emergency department.  Pager Numbers  - Dr. Kowalski: [phone]  - Dr. Moye: [phone]  - Dr. Stewart: [phone]  In the event of inclement weather, please call our main line at [phone] for an update on the status of any delays or closures.  Dermatology Medication Tips: Please  keep the boxes that topical medications come in in order to help keep track of the instructions about where and how to use these. Pharmacies typically print the medication instructions only on the boxes and not directly on the medication tubes.   If your medication is too expensive, please contact our office at [phone] option 4 or send us a message through MyChart.   We are unable to tell what your co-pay for medications will be in advance as this is different depending on your insurance coverage. However, we may be able to find a substitute medication at lower cost or fill out paperwork to get insurance to cover a needed medication.   If a prior authorization is required to get your medication covered by your insurance company, please allow us 1-2 business days to complete this process.  Drug prices often vary depending on where the prescription is filled and some pharmacies may offer cheaper prices.  The website www.goodrx.com contains coupons for medications through different pharmacies. The prices here do not account for what the cost may be with help from insurance (it may be cheaper with your insurance), but the website can give you the price if you did not use any insurance.  - You can print the associated coupon and take it with your prescription to the pharmacy.  - You may also stop by our office during regular business hours and pick up a GoodRx coupon card.  - If you need your prescription sent electronically to a different pharmacy, notify our office through [facility] MyChart or by phone at [phone] option 4.     Si Usted Necesita Algo Despus de Su Visita  Tambin puede enviarnos un mensaje a travs de MyChart. Por lo general respondemos a los mensajes de MyChart en el transcurso de 1 a 2 das hbiles.  Para renovar recetas, por favor pida a su farmacia que se ponga en contacto con nuestra oficina. Nuestro nmero de fax es el [phone].  Si tiene un asunto urgente  cuando la clnica est cerrada y que no puede esperar hasta el siguiente da hbil, puede llamar/localizar a su doctor(a) al nmero que aparece a continuacin.   Por favor, tenga en cuenta que aunque hacemos todo lo posible para estar disponibles para asuntos urgentes fuera del horario de oficina, no estamos disponibles las 24 horas del da, los 7 das de la semana.   Si tiene un problema urgente y no puede comunicarse con nosotros, puede optar por buscar atencin mdica  en el consultorio de su doctor(a), en   una clnica privada, en un centro de atencin urgente o en una sala de emergencias.  Si tiene una emergencia mdica, por favor llame inmediatamente al 911 o vaya a la sala de emergencias.  Nmeros de bper  - Dr. Kowalski: [phone]  - Dra. Moye: [phone]  - Dra. Stewart: [phone]  En caso de inclemencias del tiempo, por favor llame a nuestra lnea principal al [phone] para una actualizacin sobre el estado de cualquier retraso o cierre.  Consejos para la medicacin en dermatologa: Por favor, guarde las cajas en las que vienen los medicamentos de uso tpico para ayudarle a seguir las instrucciones sobre dnde y cmo usarlos. Las farmacias generalmente imprimen las instrucciones del medicamento slo en las cajas y no directamente en los tubos del medicamento.   Si su medicamento es muy caro, por favor, pngase en contacto con nuestra oficina llamando al [phone] y presione la opcin 4 o envenos un mensaje a travs de MyChart.   No podemos decirle cul ser su copago por los medicamentos por adelantado ya que esto es diferente dependiendo de la cobertura de su seguro. Sin embargo, es posible que podamos encontrar un medicamento sustituto a menor costo o llenar un formulario para que el seguro cubra el medicamento que se considera necesario.   Si se requiere una autorizacin previa para que su compaa de seguros cubra su medicamento, por favor permtanos de 1 a 2  das hbiles para completar este proceso.  Los precios de los medicamentos varan con frecuencia dependiendo del lugar de dnde se surte la receta y alguna farmacias pueden ofrecer precios ms baratos.  El sitio web www.goodrx.com tiene cupones para medicamentos de diferentes farmacias. Los precios aqu no tienen en cuenta lo que podra costar con la ayuda del seguro (puede ser ms barato con su seguro), pero el sitio web puede darle el precio si no utiliz ningn seguro.  - Puede imprimir el cupn correspondiente y llevarlo con su receta a la farmacia.  - Tambin puede pasar por nuestra oficina durante el horario de atencin regular y recoger una tarjeta de cupones de GoodRx.  - Si necesita que su receta se enve electrnicamente a una farmacia diferente, informe a nuestra oficina a travs de MyChart de [facility] o por telfono llamando al [phone] y presione la opcin 4.  

## 2021-08-01 NOTE — Progress Notes (Signed)
Follow-Up Visit   Subjective  Cassie [name] is a 51 y.o. female who presents for the following: Annual Exam.  The patient presents for Total-Body Skin Exam (TBSE) for skin cancer screening and mole check.  The patient has spots, moles and lesions to be evaluated, some may be new or changing. No history of skin cancer. She has a history of rosacea, not currently treating, but she has started having a few bumps on face.   The following portions of the chart were reviewed this encounter and updated as appropriate:       Review of Systems:  No other skin or systemic complaints except as noted in HPI or Assessment and Plan.  Objective  Well appearing patient in no apparent distress; mood and affect are within normal limits.  A full examination was performed including scalp, head, eyes, ears, nose, lips, neck, chest, axillae, abdomen, back, buttocks, bilateral upper extremities, bilateral lower extremities, hands, feet, fingers, toes, fingernails, and toenails. All findings within normal limits unless otherwise noted below.  L upper abdomen 0.8 x 0.4 cm brown macule darker center  R medial breast 5.31m brown macule R med breast     face Mid face erythema with telangiectasias +/- scattered inflammatory papules.     Assessment & Plan  Skin cancer screening performed today.  Actinic Damage - chronic, secondary to cumulative UV radiation exposure/sun exposure over time - diffuse scaly erythematous macules with underlying dyspigmentation - Recommend daily broad spectrum sunscreen SPF 30+ to sun-exposed areas, reapply every 2 hours as needed.  - Recommend staying in the shade or wearing long sleeves, sun glasses (UVA+UVB protection) and wide brim hats (4-inch brim around the entire circumference of the hat). - Call for new or changing lesions.  Lentigines - Scattered tan macules - Due to sun exposure - Benign-appering, observe - Recommend daily broad spectrum sunscreen SPF 30+  to sun-exposed areas, reapply every 2 hours as needed. - Call for any changes  Hemangiomas - Red papules - Discussed benign nature - Observe - Call for any changes  Seborrheic Keratoses - Stuck-on, waxy, tan-brown papules and/or plaques  - Benign-appearing - Discussed benign etiology and prognosis. - Observe - Call for any changes  Melanocytic Nevi - Tan-brown and/or pink-flesh-colored symmetric macules and papules - Benign appearing on exam today - Observation - Call clinic for new or changing moles - Recommend daily use of broad spectrum spf 30+ sunscreen to sun-exposed areas.   Nevus (2) L upper abdomen; R medial breast  Benign-appearing.  Observation.  Call clinic for new or changing moles.  Recommend daily use of broad spectrum spf 30+ sunscreen to sun-exposed areas.   Rosacea face  Chronic and persistent condition with duration or expected duration over one year. Condition is bothersome/symptomatic for patient. Currently flared.   Rosacea is a chronic progressive skin condition usually affecting the face of adults, causing redness and/or acne bumps. It is treatable but not curable. It sometimes affects the eyes (ocular rosacea) as well. It may respond to topical and/or systemic medication and can flare with stress, sun exposure, alcohol, exercise and some foods.  Daily application of broad spectrum spf 30+ sunscreen to face is recommended to reduce flares.  Start metronidazole 0.75% cream Apply to face QD/BID for rosacea dsp 45g 11Rf.  metroNIDAZOLE (METROCREAM) 0.75 % cream - face Apply to face one to two times a day for rosacea.   Return in about 1 year (around 08/02/2022) for TBSE.  ILindi Adie CMA, am  acting as scribe for Brendolyn Patty, MD .  Documentation: I have reviewed the above documentation for accuracy and completeness, and I agree with the above.  Brendolyn Patty MD

## 2021-08-28 ENCOUNTER — Other Ambulatory Visit: Payer: Self-pay | Admitting: Cardiology

## 2021-09-20 ENCOUNTER — Other Ambulatory Visit: Payer: BC Managed Care – PPO

## 2021-09-29 ENCOUNTER — Encounter: Payer: BC Managed Care – PPO | Admitting: Family Medicine

## 2021-10-15 ENCOUNTER — Other Ambulatory Visit: Payer: Self-pay | Admitting: Family Medicine

## 2021-10-15 DIAGNOSIS — E559 Vitamin D deficiency, unspecified: Secondary | ICD-10-CM

## 2021-10-15 DIAGNOSIS — Z1159 Encounter for screening for other viral diseases: Secondary | ICD-10-CM

## 2021-10-15 DIAGNOSIS — R7989 Other specified abnormal findings of blood chemistry: Secondary | ICD-10-CM

## 2021-10-15 DIAGNOSIS — Z131 Encounter for screening for diabetes mellitus: Secondary | ICD-10-CM

## 2021-10-16 ENCOUNTER — Other Ambulatory Visit (INDEPENDENT_AMBULATORY_CARE_PROVIDER_SITE_OTHER): Payer: BC Managed Care – PPO

## 2021-10-16 DIAGNOSIS — Z131 Encounter for screening for diabetes mellitus: Secondary | ICD-10-CM | POA: Diagnosis not present

## 2021-10-16 DIAGNOSIS — Z1159 Encounter for screening for other viral diseases: Secondary | ICD-10-CM | POA: Diagnosis not present

## 2021-10-16 DIAGNOSIS — R7989 Other specified abnormal findings of blood chemistry: Secondary | ICD-10-CM

## 2021-10-16 DIAGNOSIS — E559 Vitamin D deficiency, unspecified: Secondary | ICD-10-CM

## 2021-10-16 LAB — VITAMIN D 25 HYDROXY (VIT D DEFICIENCY, FRACTURES): VITD: 39.2 ng/mL (ref 30.00–100.00)

## 2021-10-16 LAB — COMPREHENSIVE METABOLIC PANEL
ALT: 20 U/L (ref 0–35)
AST: 17 U/L (ref 0–37)
Albumin: 4.5 g/dL (ref 3.5–5.2)
Alkaline Phosphatase: 131 U/L — ABNORMAL HIGH (ref 39–117)
BUN: 15 mg/dL (ref 6–23)
CO2: 27 mEq/L (ref 19–32)
Calcium: 9.8 mg/dL (ref 8.4–10.5)
Chloride: 104 mEq/L (ref 96–112)
Creatinine, Ser: 1.03 mg/dL (ref 0.40–1.20)
GFR: 64.15 mL/min (ref >60.00)
Glucose, Bld: 100 mg/dL — ABNORMAL HIGH (ref 70–99)
Potassium: 4.5 mEq/L (ref 3.5–5.1)
Sodium: 141 mEq/L (ref 135–145)
Total Bilirubin: 0.6 mg/dL (ref 0.2–1.2)
Total Protein: 7.1 g/dL (ref 6.0–8.3)

## 2021-10-16 LAB — TSH: TSH: 2.87 u[IU]/mL (ref 0.35–5.50)

## 2021-10-16 LAB — T4, FREE: Free T4: 0.91 ng/dL (ref 0.60–1.60)

## 2021-10-17 LAB — T3: T3, Total: 165 ng/dL (ref 76–181)

## 2021-10-17 LAB — HEPATITIS C ANTIBODY: Hepatitis C Ab: NONREACTIVE

## 2021-10-23 ENCOUNTER — Ambulatory Visit (INDEPENDENT_AMBULATORY_CARE_PROVIDER_SITE_OTHER): Payer: BC Managed Care – PPO | Admitting: Family Medicine

## 2021-10-23 ENCOUNTER — Encounter: Payer: Self-pay | Admitting: Family Medicine

## 2021-10-23 VITALS — BP 103/70 | HR 102 | Temp 98.4°F | Ht 64.0 in | Wt 198.5 lb

## 2021-10-23 DIAGNOSIS — K219 Gastro-esophageal reflux disease without esophagitis: Secondary | ICD-10-CM

## 2021-10-23 DIAGNOSIS — E66811 Obesity, class 1: Secondary | ICD-10-CM

## 2021-10-23 DIAGNOSIS — Z0001 Encounter for general adult medical examination with abnormal findings: Secondary | ICD-10-CM | POA: Diagnosis not present

## 2021-10-23 DIAGNOSIS — Z01419 Encounter for gynecological examination (general) (routine) without abnormal findings: Secondary | ICD-10-CM | POA: Diagnosis not present

## 2021-10-23 DIAGNOSIS — U099 Post covid-19 condition, unspecified: Secondary | ICD-10-CM

## 2021-10-23 DIAGNOSIS — R058 Other specified cough: Secondary | ICD-10-CM | POA: Diagnosis not present

## 2021-10-23 DIAGNOSIS — R490 Dysphonia: Secondary | ICD-10-CM | POA: Diagnosis not present

## 2021-10-23 DIAGNOSIS — Z23 Encounter for immunization: Secondary | ICD-10-CM

## 2021-10-23 DIAGNOSIS — R946 Abnormal results of thyroid function studies: Secondary | ICD-10-CM

## 2021-10-23 DIAGNOSIS — J399 Disease of upper respiratory tract, unspecified: Secondary | ICD-10-CM

## 2021-10-23 DIAGNOSIS — E669 Obesity, unspecified: Secondary | ICD-10-CM

## 2021-10-23 DIAGNOSIS — R7989 Other specified abnormal findings of blood chemistry: Secondary | ICD-10-CM

## 2021-10-23 DIAGNOSIS — E559 Vitamin D deficiency, unspecified: Secondary | ICD-10-CM

## 2021-10-23 DIAGNOSIS — E6609 Other obesity due to excess calories: Secondary | ICD-10-CM

## 2021-10-23 MED ORDER — FAMOTIDINE 20 MG PO TABS: 20.0000 mg | ORAL_TABLET | Freq: Every day | ORAL | 3 refills | Status: DC

## 2021-10-23 NOTE — Patient Instructions (Addendum)
Flu shot today We will refer you to OBGYN.  Call [facility] to schedule rpt mammogram.  Good to see you today Return as needed or in 1 year for next physical.  Health Maintenance, Female Adopting a healthy lifestyle and getting preventive care are important in promoting health and wellness. Ask your health care provider about: The right schedule for you to have regular tests and exams. Things you can do on your own to prevent diseases and keep yourself healthy. What should I know about diet, weight, and exercise? Eat a healthy diet  Eat a diet that includes plenty of vegetables, fruits, low-fat dairy products, and lean protein. Do not eat a lot of foods that are high in solid fats, added sugars, or sodium. Maintain a healthy weight Body mass index (BMI) is used to identify weight problems. It estimates body fat based on height and weight. Your health care provider can help determine your BMI and help you achieve or maintain a healthy weight. Get regular exercise Get regular exercise. This is one of the most important things you can do for your health. Most adults should: Exercise for at least 150 minutes each week. The exercise should increase your heart rate and make you sweat (moderate-intensity exercise). Do strengthening exercises at least twice a week. This is in addition to the moderate-intensity exercise. Spend less time sitting. Even light physical activity can be beneficial. Watch cholesterol and blood lipids Have your blood tested for lipids and cholesterol at 49 years of age, then have this test every 5 years. Have your cholesterol levels checked more often if: Your lipid or cholesterol levels are high. You are older than 49 years of age. You are at high risk for heart disease. What should I know about cancer screening? Depending on your health history and family history, you may need to have cancer screening at various ages. This may include screening for: Breast  cancer. Cervical cancer. Colorectal cancer. Skin cancer. Lung cancer. What should I know about heart disease, diabetes, and high blood pressure? Blood pressure and heart disease High blood pressure causes heart disease and increases the risk of stroke. This is more likely to develop in people who have high blood pressure readings or are overweight. Have your blood pressure checked: Every 3-5 years if you are 67-67 years of age. Every year if you are 72 years old or older. Diabetes Have regular diabetes screenings. This checks your fasting blood sugar level. Have the screening done: Once every three years after age 57 if you are at a normal weight and have a low risk for diabetes. More often and at a younger age if you are overweight or have a high risk for diabetes. What should I know about preventing infection? Hepatitis B If you have a higher risk for hepatitis B, you should be screened for this virus. Talk with your health care provider to find out if you are at risk for hepatitis B infection. Hepatitis C Testing is recommended for: Everyone born from [birth year] through 1963. Anyone with known risk factors for hepatitis C. Sexually transmitted infections (STIs) Get screened for STIs, including gonorrhea and chlamydia, if: You are sexually active and are younger than 49 years of age. You are older than 49 years of age and your health care provider tells you that you are at risk for this type of infection. Your sexual activity has changed since you were last screened, and you are at increased risk for chlamydia or gonorrhea. Ask your health care  provider if you are at risk. Ask your health care provider about whether you are at high risk for HIV. Your health care provider may recommend a prescription medicine to help prevent HIV infection. If you choose to take medicine to prevent HIV, you should first get tested for HIV. You should then be tested every 3 months for as long as you are taking  the medicine. Pregnancy If you are about to stop having your period (premenopausal) and you may become pregnant, seek counseling before you get pregnant. Take 400 to 800 micrograms (mcg) of folic acid every day if you become pregnant. Ask for birth control (contraception) if you want to prevent pregnancy. Osteoporosis and menopause Osteoporosis is a disease in which the bones lose minerals and strength with aging. This can result in bone fractures. If you are 39 years old or older, or if you are at risk for osteoporosis and fractures, ask your health care provider if you should: Be screened for bone loss. Take a calcium or vitamin D supplement to lower your risk of fractures. Be given hormone replacement therapy (HRT) to treat symptoms of menopause. Follow these instructions at home: Alcohol use Do not drink alcohol if: Your health care provider tells you not to drink. You are pregnant, may be pregnant, or are planning to become pregnant. If you drink alcohol: Limit how much you have to: 0-1 drink a day. Know how much alcohol is in your drink. In the [place] one drink equals one 12 oz bottle of beer (355 mL), one 5 oz glass of wine (148 mL), or one 1 oz glass of hard liquor (44 mL). Lifestyle Do not use any products that contain nicotine or tobacco. These products include cigarettes, chewing tobacco, and vaping devices, such as e-cigarettes. If you need help quitting, ask your health care provider. Do not use street drugs. Do not share needles. Ask your health care provider for help if you need support or information about quitting drugs. General instructions Schedule regular health, dental, and eye exams. Stay current with your vaccines. Tell your health care provider if: You often feel depressed. You have ever been abused or do not feel safe at home. Summary Adopting a healthy lifestyle and getting preventive care are important in promoting health and wellness. Follow your health  care provider's instructions about healthy diet, exercising, and getting tested or screened for diseases. Follow your health care provider's instructions on monitoring your cholesterol and blood pressure. This information is not intended to replace advice given to you by your health care provider. Make sure you discuss any questions you have with your health care provider. Document Revised: 05/09/2020 Document Reviewed: 05/09/2020 Elsevier Patient Education  [place].

## 2021-10-23 NOTE — Progress Notes (Signed)
Patient ID: Cassie [name], female    DOB: [birth year], 51 y.o.   MRN: [id number]  This visit was conducted in person.  BP 103/70   Pulse (!) 102   Temp 98.4 F (36.9 C) (Temporal)   Ht '5\' 4"'$  (1.626 m)   Wt 198 lb 8 oz (90 kg)   LMP 01/27/2000   SpO2 98%   BMI 34.07 kg/m    CC: CPE Subjective:   HPI: Cassie [name] is a 51 y.o. female presenting on 10/23/2021 for Annual Exam and Laryngitis (X 2 days )   COVID-19 bronchitis 08/2018.  Long hauler symptoms of chronic fatigue, chronic cough and hoarseness, chronic tachycardia since. Has seen cardiology, pulmonology, ENT, completed rehab through [facility], as well as saw speech therapist at [facility]. 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 Regimen includes amitriptyline, diltiazem, pantoprazole, pepcid, with albuterol and atrovent nebs. Also continues dextromethorphan, antihistamine. Now on Trelegy. Followed by COVID long haul clinic at [facility]. Just got accepted to [place] research study.   Saturday went to [employer]. Enjoyed time but Sunday stayed in bed all day and has had hoarseness since.    Preventative: COLONOSCOPY 06/2013 - mod diverticulosis, rpt 10 yrs Henrene Pastor) Breast cancer screening - mammo 10/2020 [facility] @ [facility]. This was callback for dx mammo/US LEFT. Fmhx breast cancer (maternal grandmother, mother, paternal aunt).  Well woman exam - with [facility] last seen 03/2016. Needs new referral - placed. Last pap 03/2016. s/p hysterectomy age 74yo for endometriosis, ovaries remain.  Lung cancer screening - not due DEXA scan - not yet due Flu shot - declines COVID vaccine Pfizer 11/2019, 12/202, no booster Td 2009, Tdap 12/2017.  Seat belt use discussed  Sunscreen use discussed. No changing moles on skin. Yearly derm check. Sister h/o melanoma.  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 Interim medical history since our last visit reviewed. Allergies and medications reviewed and updated. Outpatient Medications Prior to Visit  Medication Sig Dispense Refill   albuterol (PROVENTIL) (2.5 MG/3ML) 0.083% nebulizer solution Take 3 mLs (2.5 mg total) by nebulization every 6 (six) hours as needed for wheezing or shortness of breath. 150 mL 0   amitriptyline (ELAVIL) 50 MG tablet TAKE 1 TABLET BY MOUTH EVERYDAY AT BEDTIME 90 tablet 3   Biotin 5 MG CAPS Take 1 capsule (5 mg total) by mouth daily.  0   budesonide (PULMICORT) 0.5 MG/2ML nebulizer solution TAKE 2 MLS (0.5 MG TOTAL) BY NEBULIZATION DAILY. 180 mL 1   budesonide (PULMICORT) 0.5 MG/2ML nebulizer solution Inhale into the lungs as needed.     chlorpheniramine (CHLOR-TRIMETON) 4 MG tablet Take 4 mg by mouth 2 (two) times daily.     cholecalciferol (VITAMIN D3) 25 MCG (1000 UNIT) tablet Take 1,000 Units by mouth daily.     Dextromethorphan Polistirex (DELSYM PO) Take by mouth daily.     diltiazem (CARDIZEM CD) 240 MG 24 hr capsule TAKE 1 CAPSULE BY MOUTH EVERY DAY 90 capsule 3   ipratropium (ATROVENT) 0.02 % nebulizer solution Inhale into  the lungs. As Needed     metroNIDAZOLE (METROCREAM) 0.75 % cream Apply to face one to two times a day for rosacea. 45 g 11   pantoprazole (PROTONIX) 40 MG tablet Take 1 tablet (40 mg total) by mouth daily. Take 30-60 min before first meal of the day 90 tablet 3   Respiratory Therapy Supplies (FLUTTER) DEVI Use as directed 1 each 0   famotidine (PEPCID) 20 MG tablet TAKE ONE TABLET BY MOITH AFTER SUPPER 90 tablet 1   TRELEGY ELLIPTA 200-62.5-25 MCG/ACT AEPB Inhale 1 puff into the lungs daily.     albuterol (PROVENTIL) (2.5 MG/3ML)  0.083% nebulizer solution Inhale into the lungs as needed.     No facility-administered medications prior to visit.     Per HPI unless specifically indicated in ROS section below Review of Systems  Constitutional:  Positive for fatigue. Negative for activity change, appetite change, chills, fever and unexpected weight change.  HENT:  Negative for hearing loss.   Eyes:  Negative for visual disturbance.  Respiratory:  Positive for cough, shortness of breath and wheezing. Negative for chest tightness.   Cardiovascular:  Positive for palpitations and leg swelling (mild). Negative for chest pain.  Gastrointestinal:  Positive for constipation. Negative for abdominal distention, abdominal pain, blood in stool, diarrhea, nausea and vomiting.  Genitourinary:  Negative for difficulty urinating and hematuria.  Musculoskeletal:  Negative for arthralgias, myalgias and neck pain.  Skin:  Negative for rash.  Neurological:  Negative for dizziness, seizures, syncope and headaches.  Hematological:  Negative for adenopathy. Does not bruise/bleed easily.  Psychiatric/Behavioral:  Negative for dysphoric mood. The patient is not nervous/anxious.     Objective:  BP 103/70   Pulse (!) 102   Temp 98.4 F (36.9 C) (Temporal)   Ht '5\' 4"'$  (1.626 m)   Wt 198 lb 8 oz (90 kg)   LMP 01/27/2000   SpO2 98%   BMI 34.07 kg/m   Wt Readings from Last 3 Encounters:  10/23/21 198 lb 8 oz (90 kg)  03/28/21 203 lb 2 oz (92.1 kg)  03/21/21 204 lb 9.6 oz (92.8 kg)      Physical Exam Vitals and nursing note reviewed.  Constitutional:      Appearance: Normal appearance. She is not ill-appearing.     Comments: Hoarse voice  HENT:     Head: Normocephalic and atraumatic.     Right Ear: Tympanic membrane, ear canal and external ear normal. There is no impacted cerumen.     Left Ear: Tympanic membrane, ear canal and external ear normal. There is no impacted cerumen.     Mouth/Throat:     Mouth: Mucous membranes are  moist.     Pharynx: Oropharynx is clear. No oropharyngeal exudate or posterior oropharyngeal erythema.  Eyes:     General:        Right eye: No discharge.        Left eye: No discharge.     Extraocular Movements: Extraocular movements intact.     Conjunctiva/sclera: Conjunctivae normal.     Pupils: Pupils are equal, round, and reactive to light.  Neck:     Thyroid: No thyroid mass or thyromegaly.  Cardiovascular:     Rate and Rhythm: Normal rate and regular rhythm.     Pulses: Normal pulses.     Heart sounds: Normal heart sounds. No murmur heard. Pulmonary:     Effort: Pulmonary effort is normal. No respiratory distress.     Breath sounds: Normal breath sounds.  No wheezing, rhonchi or rales.  Abdominal:     General: Bowel sounds are normal. There is no distension.     Palpations: Abdomen is soft. There is no mass.     Tenderness: There is no abdominal tenderness. There is no guarding or rebound.     Hernia: No hernia is present.  Musculoskeletal:     Cervical back: Normal range of motion and neck supple. No rigidity.     Right lower leg: No edema.     Left lower leg: No edema.  Lymphadenopathy:     Cervical: No cervical adenopathy.  Skin:    General: Skin is warm and dry.     Findings: No rash.  Neurological:     General: No focal deficit present.     Mental Status: She is alert. Mental status is at baseline.  Psychiatric:        Mood and Affect: Mood normal.        Behavior: Behavior normal.       Results for orders placed or performed in visit on 10/16/21  Comprehensive metabolic panel  Result Value Ref Range   Sodium 141 135 - 145 mEq/L   Potassium 4.5 3.5 - 5.1 mEq/L   Chloride 104 96 - 112 mEq/L   CO2 27 19 - 32 mEq/L   Glucose, Bld 100 (H) 70 - 99 mg/dL   BUN 15 6 - 23 mg/dL   Creatinine, Ser 1.03 0.40 - 1.20 mg/dL   Total Bilirubin 0.6 0.2 - 1.2 mg/dL   Alkaline Phosphatase 131 (H) 39 - 117 U/L   AST 17 0 - 37 U/L   ALT 20 0 - 35 U/L   Total Protein 7.1  6.0 - 8.3 g/dL   Albumin 4.5 3.5 - 5.2 g/dL   GFR 64.15 >60.00 mL/min   Calcium 9.8 8.4 - 10.5 mg/dL  Hepatitis C antibody  Result Value Ref Range   Hepatitis C Ab NON-REACTIVE NON-REACTIVE  T3  Result Value Ref Range   T3, Total 165 76 - 181 ng/dL  T4, free  Result Value Ref Range   Free T4 0.91 0.60 - 1.60 ng/dL  TSH  Result Value Ref Range   TSH 2.87 0.35 - 5.50 uIU/mL  VITAMIN D 25 Hydroxy (Vit-D Deficiency, Fractures)  Result Value Ref Range   VITD 39.20 30.00 - 100.00 ng/mL    Assessment & Plan:   Problem List Items Addressed This Visit     Encounter for general adult medical examination with abnormal findings - Primary (Chronic)    Preventative protocols reviewed and updated unless pt declined. Discussed healthy diet and lifestyle.       Obesity, Class I, BMI 30-34.9    Activity limited by long-COVID.       Vitamin D deficiency    Stable period on 1000 IU replacement. Suggested increase to 2000 IU daily      Post-COVID-19 syndrome    She is a COVID long-hauler. Has established with longhauler COVID clinic through [facility]. Appreciate their care.       Upper airway cough syndrome   Relevant Medications   famotidine (PEPCID) 20 MG tablet   Low TSH level    TFTs have normalized      Hoarseness of voice   GERD (gastroesophageal reflux disease)    Continues pepcid and protonix regularly.       Relevant Medications   famotidine (PEPCID) 20 MG tablet   Other Visit Diagnoses     Need for influenza  vaccination       Relevant Orders   Flu Vaccine QUAD 39moIM (Fluarix, Fluzone & Alfiuria Quad PF) (Completed)   Well woman exam       Relevant Orders   Ambulatory referral to Gynecology        Meds ordered this encounter  Medications   famotidine (PEPCID) 20 MG tablet    Sig: Take 1 tablet (20 mg total) by mouth at bedtime.    Dispense:  90 tablet    Refill:  3   Orders Placed This Encounter  Procedures   Flu Vaccine QUAD 637moM (Fluarix, Fluzone &  Alfiuria Quad PF)   Ambulatory referral to Gynecology    Referral Priority:   Routine    Referral Type:   Consultation    Referral Reason:   Specialty Services Required    Requested Specialty:   Gynecology    Number of Visits Requested:   1    Patient instructions: Flu shot today We will refer you to OBGYN.  Call [facility] to schedule rpt mammogram.  Good to see you today Return as needed or in 1 year for next physical.  Follow up plan: Return in about 1 year (around 10/24/2022) for annual exam, prior fasting for blood work.  JaRia BushMD

## 2021-10-23 NOTE — Assessment & Plan Note (Signed)
Preventative protocols reviewed and updated unless pt declined. Discussed healthy diet and lifestyle.  

## 2021-10-24 NOTE — Assessment & Plan Note (Signed)
Continues pepcid and protonix regularly.

## 2021-10-24 NOTE — Assessment & Plan Note (Signed)
She is a COVID long-hauler. Has established with longhauler COVID clinic through [facility]. Appreciate their care.

## 2021-10-24 NOTE — Assessment & Plan Note (Signed)
Stable period on 1000 IU replacement. Suggested increase to 2000 IU daily

## 2021-10-24 NOTE — Assessment & Plan Note (Signed)
Activity limited by long-COVID.

## 2021-10-24 NOTE — Assessment & Plan Note (Signed)
TFTs have normalized

## 2021-11-05 IMAGING — MG MM DIGITAL DIAGNOSTIC UNILAT*L* W/ TOMO W/ CAD
6 series · 6 of 18 positions shown · non-contrast
Comparison: Previous exam(s).

CLINICAL DATA: 48-year-old female recalled from screening mammogram
dated 09/08/2019 for a possible left breast asymmetry.

EXAM:
DIGITAL DIAGNOSTIC UNILATERAL LEFT MAMMOGRAM WITH TOMO AND CAD

[L ML synth-2D]
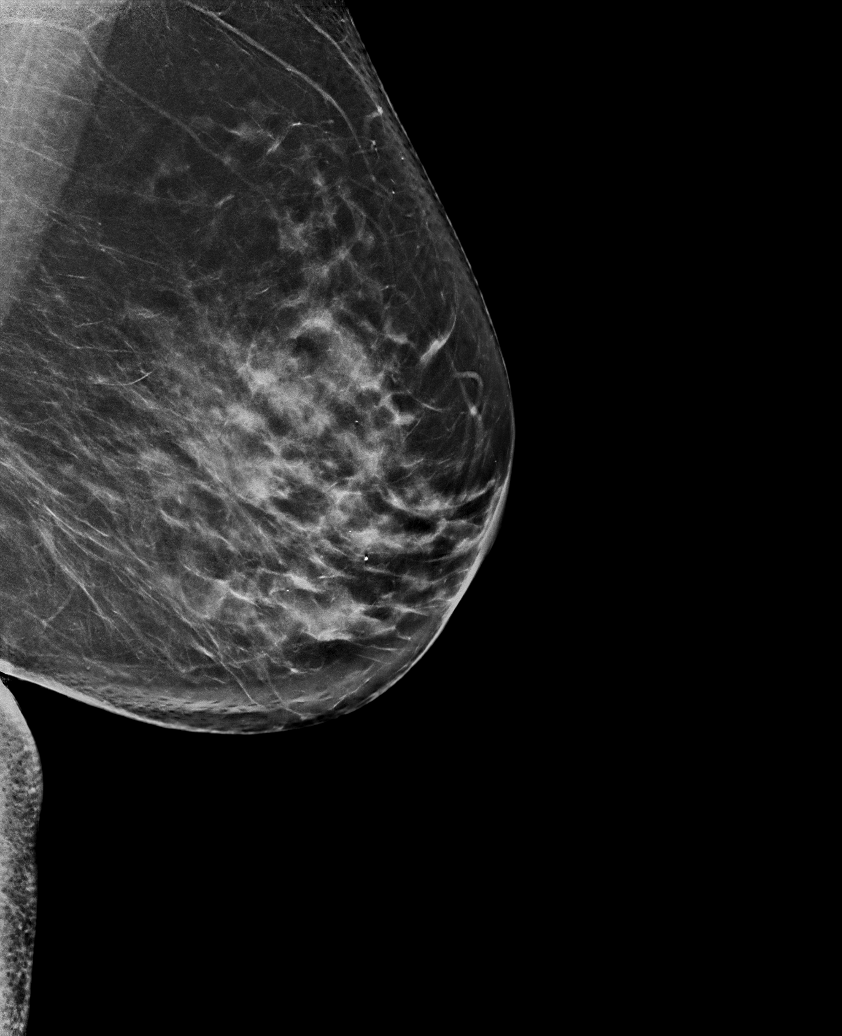

[L CC synth-2D (1 of 2)]
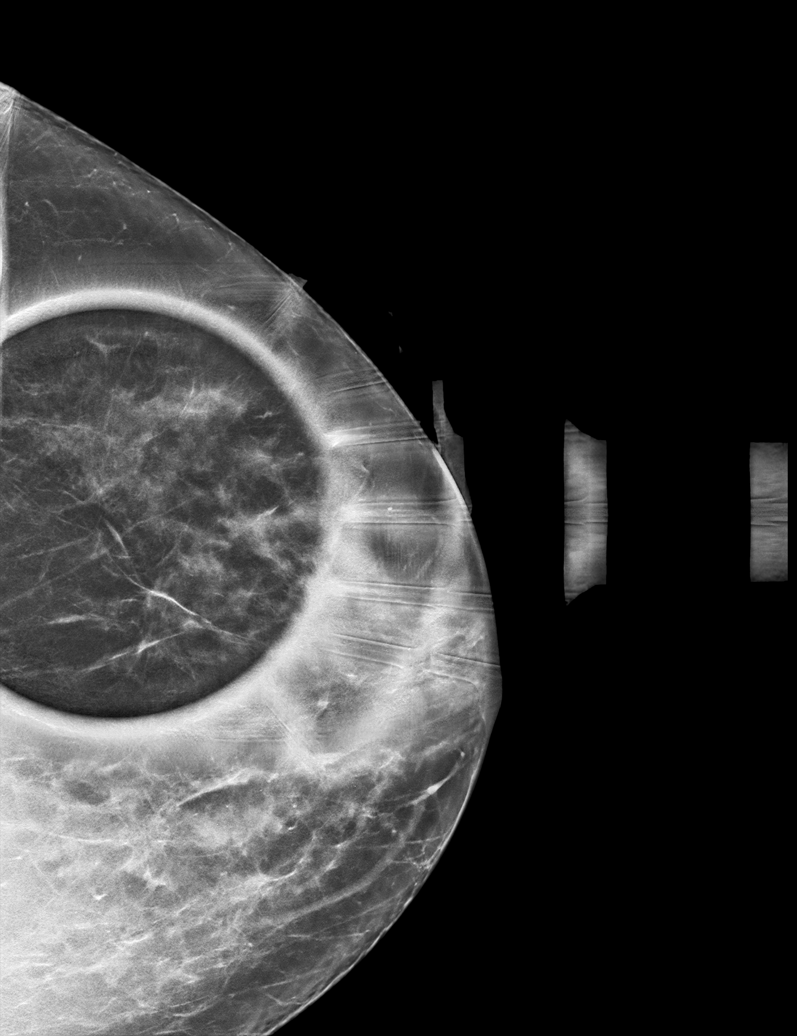

[L CC synth-2D (2 of 2)]
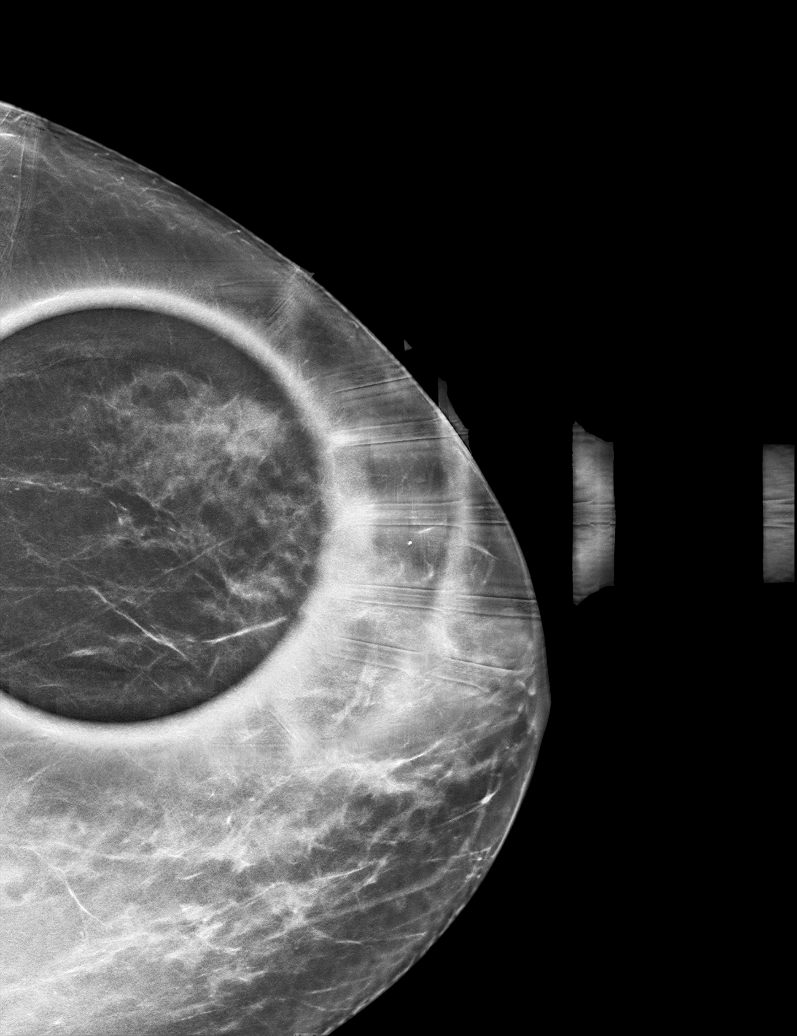

[L ML tomo · tomo slice 45/89.0]
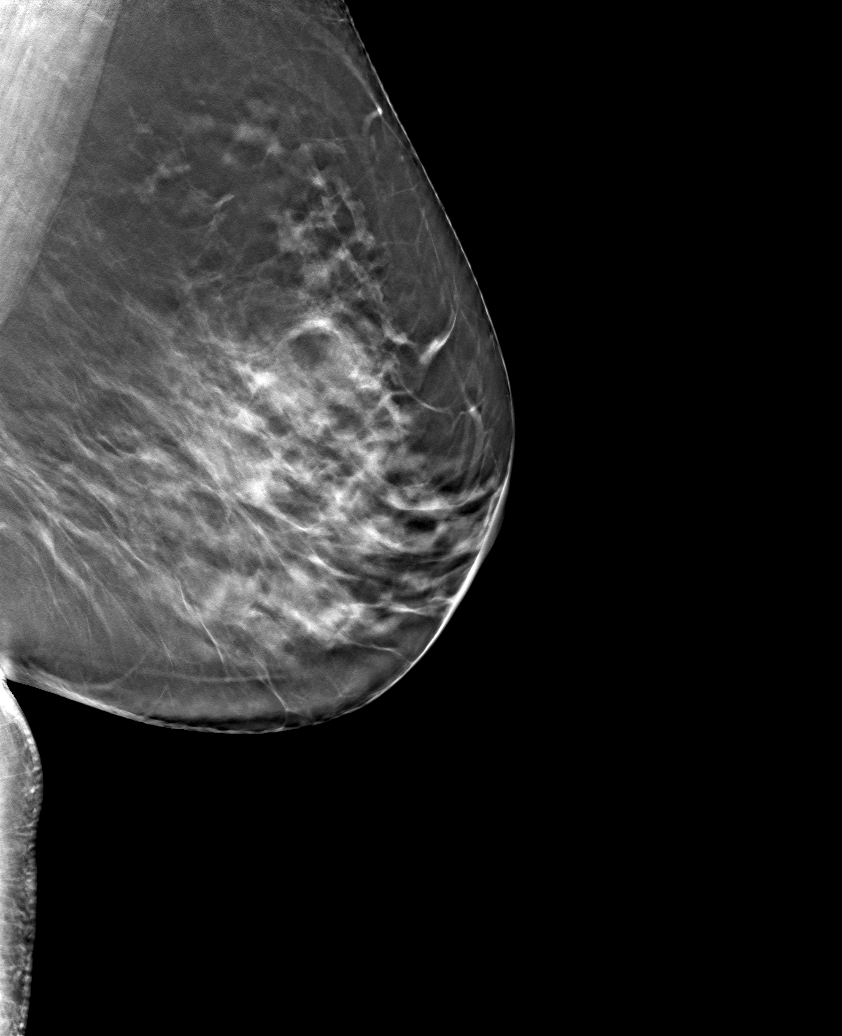

[L CC tomo (1 of 2) · tomo slice 32/63.0]
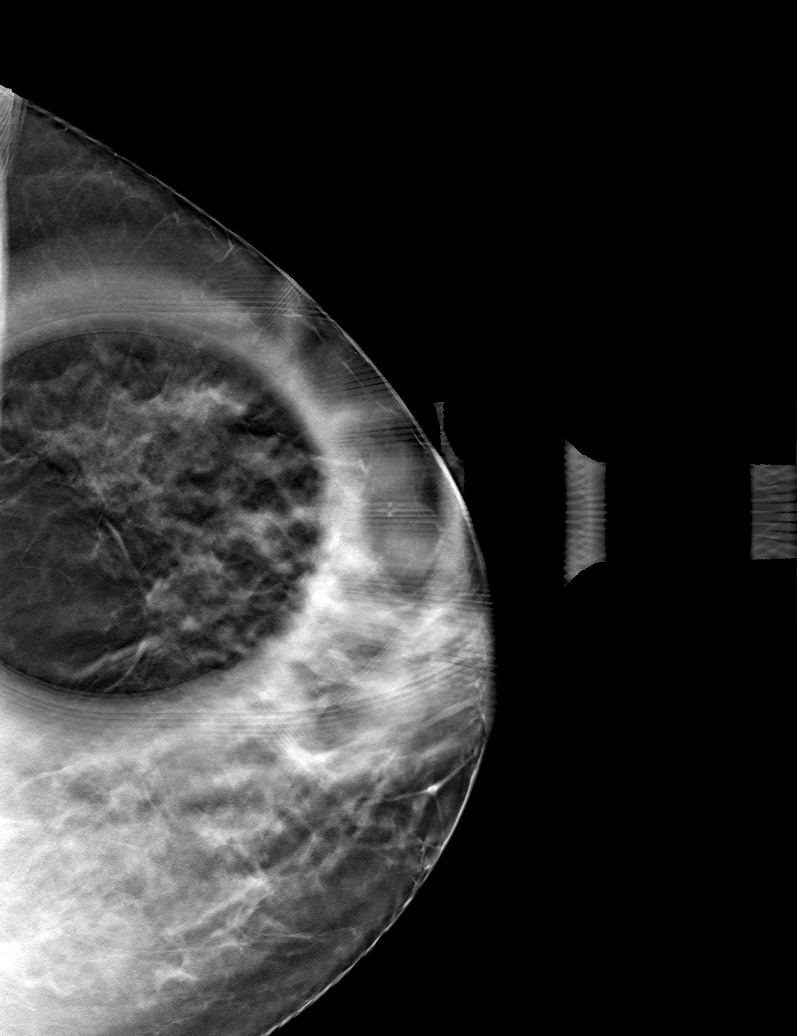

[L CC tomo (2 of 2) · tomo slice 37/72.0]
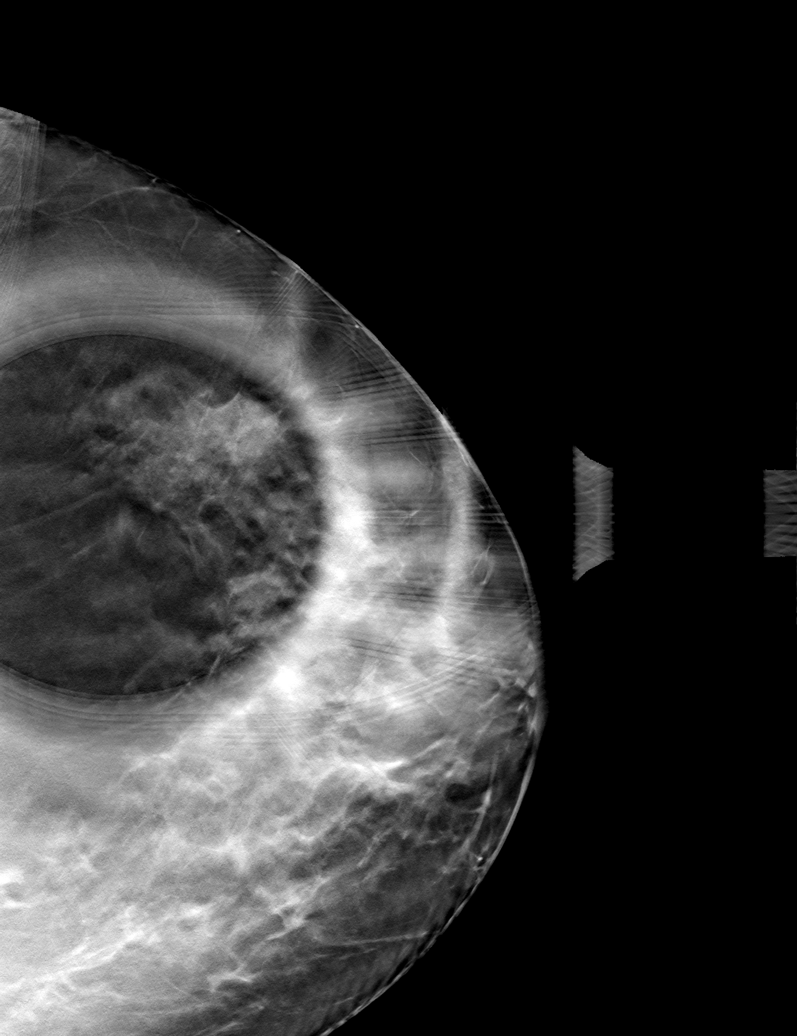

[6 of 18 positions shown; findings below may reference images not displayed]

ACR Breast Density Category c: The breast tissue is heterogeneously
dense, which may obscure small masses.
FINDINGS: Previously described, possible asymmetry in the lateral left breast
at middle depth as seen on the cc projection only resolves into well
dispersed fibroglandular tissue on today's additional views. No
suspicious findings are identified.

Mammographic images were processed with CAD.
IMPRESSION: No mammographic evidence of malignancy.

RECOMMENDATION:
Screening mammogram in one year.(Code:GJ-2-0W6)

I have discussed the findings and recommendations with the patient.
If applicable, a reminder letter will be sent to the patient
regarding the next appointment.

BI-RADS CATEGORY  1: Negative.

## 2021-11-12 NOTE — Progress Notes (Unsigned)
Cardiology Office Note   Date:  11/12/2021   ID:  Cassie, [name] Jun 14, 1970, MRN [id number]  PCP:  Ria Bush, MD  Cardiologist:   Minus Breeding, MD   No chief complaint on file.      History of Present Illness: Cassie [name] is a 51 y.o. female who presents for evaluation of dizziness.  She had chest pain in the past.  She had normal coronary arteries on cardiac catheterization in 2006.  Myoview obtained on 02/10/2016 was low risk with EF of 78%, no reversible ischemia or infarction.  Echocardiogram obtained on the same day showed EF 60 to 65%, no significant valve disorder.  The patient was referred to cardiology service for evaluation of palpitation and dizziness.  She was diagnosed with COVID-19 in August 2020.  Since her diagnosis, she has had persistent dizziness upon standing.  She also had racing heartbeat that fluctuate to low heart rate.   Repeat echocardiogram obtained on 02/11/2019 showed EF 60 to 65%, normal pulmonary artery pressure, no significant valve disorder.  3 days ZIO monitor did not show significant arrhythmia. She wore a 14 day monitor and had rare supraventricular beats.  Unfortunately she is having "long haul" symptoms from her Covid infection.   She was seen once in the [facility] clinic.  I treated her with Cardizem CD.  Inflammatory markers including hs trop were negative.   She had an appt with our pulmonary.   They did do a CT without any acute findings.  She did have normal C-reactive protein, sed rate, troponin and BNP.   At the last visit she had palpitations.  A monitor did not demonstrate any significant abnormalities.  ***   *** Since I last saw her ***   ***  she she has continued to have cough.  She still has tachycardia.  Her resting heart rate is running below 100 when she is awake.  He goes up in the 140s when she exercises.  She still doing some supervised exercise any longer unsupervised exercise which she went from them as well.   We will have her off the gabapentin.  She still has breathlessness. .  She is not describing PND or orthopnea.  She is not having any presyncope or syncope.  She is not having any chest pain.    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   Covid-19 bronchitis 08/2018  Persistent symptoms since then Followed by pulm   Rosacea    mild    Past Surgical History:  Procedure Laterality Date   BREAST BIOPSY  2010   left; core biopsy   BREAST EXCISIONAL BIOPSY Bilateral    multiple bilateral   BREAST MASS EXCISION  2008   BREAST SURGERY Right    excision of lesion   CARDIAC CATHETERIZATION  2007   CHOLECYSTECTOMY  2008   COLONOSCOPY  2006   WNL Nicolasa Ducking)   COLONOSCOPY  06/2013   mod diverticulosis, rpt 10 yrs Henrene Pastor)  LAPAROSCOPY  1994   endometriosis   TONSILLECTOMY  1994   TONSILLECTOMY AND ADENOIDECTOMY  1995   TOTAL ABDOMINAL HYSTERECTOMY     Dr Rayford Halsted for endometriosis     Current Outpatient Medications  Medication Sig Dispense Refill   albuterol (PROVENTIL) (2.5 MG/3ML) 0.083% nebulizer solution Take 3 mLs (2.5 mg total) by nebulization every 6 (six) hours as needed for wheezing or shortness of breath. 150 mL 0   amitriptyline (ELAVIL) 50 MG tablet TAKE 1 TABLET BY MOUTH EVERYDAY AT BEDTIME 90 tablet 3   Biotin 5 MG CAPS Take 1 capsule (5 mg total) by mouth daily.  0   budesonide (PULMICORT) 0.5 MG/2ML nebulizer solution TAKE 2 MLS (0.5 MG TOTAL) BY NEBULIZATION DAILY. 180 mL 1   budesonide (PULMICORT) 0.5 MG/2ML nebulizer solution Inhale into the lungs  as needed.     chlorpheniramine (CHLOR-TRIMETON) 4 MG tablet Take 4 mg by mouth 2 (two) times daily.     cholecalciferol (VITAMIN D3) 25 MCG (1000 UNIT) tablet Take 1,000 Units by mouth daily.     Dextromethorphan Polistirex (DELSYM PO) Take by mouth daily.     diltiazem (CARDIZEM CD) 240 MG 24 hr capsule TAKE 1 CAPSULE BY MOUTH EVERY DAY 90 capsule 3   famotidine (PEPCID) 20 MG tablet Take 1 tablet (20 mg total) by mouth at bedtime. 90 tablet 3   ipratropium (ATROVENT) 0.02 % nebulizer solution Inhale into the lungs. As Needed     metroNIDAZOLE (METROCREAM) 0.75 % cream Apply to face one to two times a day for rosacea. 45 g 11   pantoprazole (PROTONIX) 40 MG tablet Take 1 tablet (40 mg total) by mouth daily. Take 30-60 min before first meal of the day 90 tablet 3   Respiratory Therapy Supplies (FLUTTER) DEVI Use as directed 1 each 0   TRELEGY ELLIPTA 200-62.5-25 MCG/ACT AEPB Inhale 1 puff into the lungs daily.     No current facility-administered medications for this visit.    Allergies:   Fish allergy, Shellfish allergy, Chocolate, Cocoa, Penicillins, and Tape    ROS:  Please see the history of present illness.   Otherwise, review of systems are positive for *** .   All other systems are reviewed and negative.    PHYSICAL EXAM: VS:  LMP 01/27/2000  , BMI There is no height or weight on file to calculate BMI. GENERAL:  Well appearing NECK:  No jugular venous distention, waveform within normal limits, carotid upstroke brisk and symmetric, no bruits, no thyromegaly LUNGS:  Clear to auscultation bilaterally CHEST:  Unremarkable HEART:  PMI not displaced or sustained,S1 and S2 within normal limits, no S3, no S4, no clicks, no rubs, *** murmurs ABD:  Flat, positive bowel sounds normal in frequency in pitch, no bruits, no rebound, no guarding, no midline pulsatile mass, no hepatomegaly, no splenomegaly EXT:  2 plus pulses throughout, no edema, no cyanosis no clubbing    ***GENERAL:  Well  appearing NECK:  No jugular venous distention, waveform within normal limits, carotid upstroke brisk and symmetric, no bruits, no thyromegaly LUNGS:  Clear to auscultation bilaterally CHEST:  Unremarkable HEART:  PMI not displaced or sustained,S1 and S2 within normal limits, no S3, no S4, no clicks, no rubs,no murmurs ABD:  Flat, positive bowel sounds normal in frequency in pitch, no bruits, no rebound, no guarding, no midline pulsatile mass, no hepatomegaly, no splenomegaly EXT:  2 plus pulses throughout, no edema, no cyanosis no clubbing   EKG:  EKG is ***  ordered today. ***    Recent Labs: 10/16/2021: ALT 20; BUN 15; Creatinine, Ser 1.03; Potassium 4.5; Sodium 141; TSH 2.87    Lipid Panel    Component Value Date/Time   CHOL 170 09/01/2020 0828   CHOL 158 02/16/2016 0826   TRIG 146.0 09/01/2020 0828   HDL 50.60 09/01/2020 0828   HDL 50 02/16/2016 0826   CHOLHDL 3 09/01/2020 0828   VLDL 29.2 09/01/2020 0828   LDLCALC 90 09/01/2020 0828   LDLCALC 82 02/16/2016 0826      Wt Readings from Last 3 Encounters:  10/23/21 198 lb 8 oz (90 kg)  03/28/21 203 lb 2 oz (92.1 kg)  03/21/21 204 lb 9.6 oz (92.8 kg)      Other studies Reviewed: Additional studies/ records that were reviewed today include:   *** Review of the above records demonstrates:  Please see elsewhere in the note.     ASSESSMENT AND PLAN:   PALPITATIONS:    ***   I am going to apply a 3-day ZIO monitor to make sure she has a low heart rate at least when she is asleep.  Otherwise unwilling to continue the current regimen as she is not having severe symptoms of palpitations and her heart rate still remains high.  COVID:    ***  The hoarseness is slightly improved but she still coughs and she is still hoarse.  She requests follow-up with pulmonary and I will make this redo referral.   Reviewed at length with the patient today.  The patient does not have concerns regarding medicines.  The following changes have  been made: ***  Labs/ tests ordered today include:  ***  No orders of the defined types were placed in this encounter.    Disposition:   FU with me in *** months.    Signed, Minus Breeding, MD  11/12/2021 1:32 PM    [facility]

## 2021-11-14 ENCOUNTER — Encounter: Payer: Self-pay | Admitting: Cardiology

## 2021-11-14 ENCOUNTER — Ambulatory Visit: Payer: BC Managed Care – PPO | Attending: Cardiology | Admitting: Cardiology

## 2021-11-14 ENCOUNTER — Other Ambulatory Visit: Payer: Self-pay | Admitting: Family Medicine

## 2021-11-14 VITALS — BP 136/84 | HR 94 | Ht 65.0 in | Wt 200.2 lb

## 2021-11-14 DIAGNOSIS — R002 Palpitations: Secondary | ICD-10-CM | POA: Diagnosis not present

## 2021-11-14 DIAGNOSIS — R053 Chronic cough: Secondary | ICD-10-CM

## 2021-11-14 DIAGNOSIS — Z1231 Encounter for screening mammogram for malignant neoplasm of breast: Secondary | ICD-10-CM

## 2021-11-14 NOTE — Patient Instructions (Signed)
Medication Instructions:   Your physician recommends that you continue on your current medications as directed. Please refer to the Current Medication list given to you today.  *If you need a refill on your cardiac medications before your next appointment, please call your pharmacy*  Lab Work: NONE ordered at this time of appointment   If you have labs (blood work) drawn today and your tests are completely normal, you will receive your results only by: MyChart Message (if you have MyChart) OR A paper copy in the mail If you have any lab test that is abnormal or we need to change your treatment, we will call you to review the results.  Testing/Procedures: NONE ordered at this time of appointment   Follow-Up: At [facility], you and your health needs are our priority.  As part of our continuing mission to provide you with exceptional heart care, we have created designated Provider Care Teams.  These Care Teams include your primary Cardiologist (physician) and Advanced Practice Providers (APPs -  Physician Assistants and Nurse Practitioners) who all work together to provide you with the care you need, when you need it.  Your next appointment:   1 year(s)  The format for your next appointment:   In Person  Provider:   James Hochrein, MD     Other Instructions  Important Information About Sugar       

## 2021-11-28 ENCOUNTER — Encounter: Payer: Self-pay | Admitting: Family

## 2021-11-28 ENCOUNTER — Telehealth (INDEPENDENT_AMBULATORY_CARE_PROVIDER_SITE_OTHER): Payer: BC Managed Care – PPO | Admitting: Family

## 2021-11-28 VITALS — HR 111 | Temp 99.6°F | Ht 65.0 in | Wt 200.2 lb

## 2021-11-28 DIAGNOSIS — U071 COVID-19: Secondary | ICD-10-CM | POA: Insufficient documentation

## 2021-11-28 DIAGNOSIS — R058 Other specified cough: Secondary | ICD-10-CM

## 2021-11-28 DIAGNOSIS — R051 Acute cough: Secondary | ICD-10-CM

## 2021-11-28 MED ORDER — TRELEGY ELLIPTA 200-62.5-25 MCG/ACT IN AEPB: 1.0000 | INHALATION_SPRAY | Freq: Every day | RESPIRATORY_TRACT | 5 refills | Status: DC

## 2021-11-28 MED ORDER — BENZONATATE 100 MG PO CAPS: 200.0000 mg | ORAL_CAPSULE | Freq: Three times a day (TID) | ORAL | 0 refills | Status: DC | PRN

## 2021-11-28 MED ORDER — AZITHROMYCIN 250 MG PO TABS: ORAL_TABLET | ORAL | 0 refills | Status: AC

## 2021-11-28 MED ORDER — MOLNUPIRAVIR EUA 200MG CAPSULE: 4.0000 | ORAL_CAPSULE | Freq: Two times a day (BID) | ORAL | 0 refills | Status: AC

## 2021-11-28 MED ORDER — ALBUTEROL SULFATE HFA 108 (90 BASE) MCG/ACT IN AERS: 2.0000 | INHALATION_SPRAY | Freq: Four times a day (QID) | RESPIRATORY_TRACT | 0 refills | Status: AC | PRN

## 2021-11-28 NOTE — Assessment & Plan Note (Addendum)
Acute, worsening Pt considered high risk for hospitalization. have decided pt is a candidate for antiviral and pt agrees that she would like to take this. I have sent in RX for molnupiravir 200 mg capsules to be taken as directed. Pdmp reviewed. Rx zpack, advised pt to use if symptoms do not improve in the next few days.  Hesitant to send in rx for prednisone right now due to risk of immunosuppression and replication of covid virus.  Rx guai codeine and tessalon perrles 200 mg prn cough Rx albuterol to give unexpired inhaler Advised pt can use nebulzier if needed Continue with treligy, refill sent in . Advised of CDC guidelines for self isolation/ ending isolation.  Advised of safe practice guidelines. Symptom Tier reviewed.  Encouraged to monitor for any worsening symptoms; watch for increased shortness of breath, weakness, and signs of dehydration. Advised when to seek emergency care.  Instructed to rest and hydrate well.  Advised to leave the house during recommended isolation period, only if it is necessary to seek medical care

## 2021-11-28 NOTE — Progress Notes (Signed)
MyChart Video Visit    Virtual Visit via Video Note   This visit type was conducted due to national recommendations for restrictions regarding the COVID-19 Pandemic (e.g. social distancing) in an effort to limit this patient's exposure and mitigate transmission in our community. This patient is at least at moderate risk for complications without adequate follow up. This format is felt to be most appropriate for this patient at this time. Physical exam was limited by quality of the video and audio technology used for the visit. CMA was able to get the patient set up on a video visit.  Patient location: Home. Patient and provider in visit Provider location: Office  I discussed the limitations of evaluation and management by telemedicine and the availability of in person appointments. The patient expressed understanding and agreed to proceed.  Visit Date: 11/28/2021  Today's healthcare provider: Eugenia Pancoast, FNP     Subjective:    Patient ID: Cassie [name], female    DOB: [birth year], 51 y.o.   MRN: [id number]  Chief Complaint  Patient presents with   Covid Positive    Tested Yesterday at 5 pm started Saturday night   Nasal Congestion   Fever    Low grade    Fever     Pt here today via video visit with concerns.   Tested covid positive yesterday, started with symptoms four days ago.  With sinus pressure , nasal congestion, cough, and chest congestion. She is experiencing a low grade fever as well. 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 Cough is productive, yellow green sputum.   Taking otc mucinex with some relief.  She has not been taking anything for the cough.  She has not yet had to use her nebulizer but thinks she might this am.   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 08/2018  Persistent symptoms since then Followed by pulm   Rosacea    mild    Past Surgical History:  Procedure Laterality Date   BREAST BIOPSY  2010   left; core biopsy   BREAST EXCISIONAL BIOPSY Bilateral    multiple bilateral   BREAST MASS EXCISION  2008   BREAST SURGERY Right    excision of lesion   CARDIAC CATHETERIZATION  2007   CHOLECYSTECTOMY  2008   COLONOSCOPY  2006   WNL Nicolasa Ducking)   COLONOSCOPY  06/2013   mod diverticulosis, rpt 10 yrs Henrene Pastor)   LAPAROSCOPY  1994   endometriosis   TONSILLECTOMY  1994   TONSILLECTOMY AND ADENOIDECTOMY  1995   TOTAL ABDOMINAL HYSTERECTOMY     Dr Rayford Halsted for endometriosis    Family History  Problem Relation Age of Onset   Skin cancer Mother        melanoma   Hypertension Mother        also had polio   Irritable bowel syndrome Mother    Breast cancer Mother 32   Coronary artery disease Father  age 47's   Hypertension Father    Breast cancer Maternal Grandmother 33   Stroke Maternal Grandmother    Other Son        hyperinsulinemic   Coronary artery disease Paternal Uncle        age 45's   Skin cancer Sister 71       melanoma - cancer in eye and uterus   Thyroid disease Sister        hashimoto thyroiditis   Breast cancer Other        pat. great aunt   Irritable bowel syndrome Daughter    Diabetes Maternal Grandfather    Heart disease Maternal Grandfather    Diabetes Paternal Uncle    Epilepsy Son    Breast cancer Cousin        second cousin   Pulmonary embolism Other        died from this after MVA   Heart disease Paternal Grandmother    Heart disease Paternal Grandfather    Colon cancer Neg Hx     Social History   Socioeconomic History    Marital status: Married    Spouse name: Not on file   Number of children: 2   Years of education: Not on file   Highest education level: Not on file  Occupational History   Occupation: Glass blower/designer  Tobacco Use   Smoking status: Former    Packs/day: 0.10    Years: 5.00    Total pack years: 0.50    Types: Cigarettes    Quit date: 02/21/2007    Years since quitting: 14.7   Smokeless tobacco: Never   Tobacco comments:    social smoker  Vaping Use   Vaping Use: Never used  Substance and Sexual Activity   Alcohol use: Not Currently    Alcohol/week: 0.0 standard drinks of alcohol    Comment: occas   Drug use: No   Sexual activity: Yes    Birth control/protection: Surgical  Other Topics Concern   Not on file  Social History Narrative   Glass blower/designer   Lives with husband; 2 children; 1 dog   Son in prison   Social Determinants of Health   Financial Resource Strain: Not on file  Food Insecurity: Not on file  Transportation Needs: Not on file  Physical Activity: Not on file  Stress: Not on file  Social Connections: Not on file  Intimate Partner Violence: Not on file    Outpatient Medications Prior to Visit  Medication Sig Dispense Refill   amitriptyline (ELAVIL) 50 MG tablet TAKE 1 TABLET BY MOUTH EVERYDAY AT BEDTIME 90 tablet 3   Biotin 5 MG CAPS Take 1 capsule (5 mg total) by mouth daily.  0   budesonide (PULMICORT) 0.5 MG/2ML nebulizer solution TAKE 2 MLS (0.5 MG TOTAL) BY NEBULIZATION DAILY. 180 mL 1   chlorpheniramine (CHLOR-TRIMETON) 4 MG tablet Take 4 mg by mouth 2 (two) times daily.     cholecalciferol (VITAMIN D3) 25 MCG (1000 UNIT) tablet Take 1,000 Units by mouth daily.     diltiazem (CARDIZEM CD) 240 MG 24 hr capsule TAKE 1 CAPSULE BY MOUTH EVERY DAY 90 capsule 3   famotidine (PEPCID) 20 MG tablet Take 1 tablet (20 mg total) by mouth at bedtime. 90 tablet 3   metroNIDAZOLE (METROCREAM) 0.75 % cream Apply to face one to two times a day for rosacea. 45 g 11    pantoprazole (PROTONIX) 40 MG tablet Take 1 tablet (40 mg total) by  mouth daily. Take 30-60 min before first meal of the day 90 tablet 3   Respiratory Therapy Supplies (FLUTTER) DEVI Use as directed 1 each 0   albuterol (PROVENTIL) (2.5 MG/3ML) 0.083% nebulizer solution Take 3 mLs (2.5 mg total) by nebulization every 6 (six) hours as needed for wheezing or shortness of breath. 150 mL 0   budesonide (PULMICORT) 0.5 MG/2ML nebulizer solution Inhale into the lungs as needed.     Dextromethorphan Polistirex (DELSYM PO) Take by mouth daily.     ipratropium (ATROVENT) 0.02 % nebulizer solution Inhale into the lungs. As Needed     TRELEGY ELLIPTA 200-62.5-25 MCG/ACT AEPB Inhale 1 puff into the lungs daily.     No facility-administered medications prior to visit.    Allergies  Allergen Reactions   Fish Allergy Anaphylaxis, Hives and Swelling   Shellfish Allergy Anaphylaxis, Hives and Swelling   Chocolate     migraines   Cocoa Other (See Comments)    migraines migraines   Penicillins Rash and Other (See Comments)    Parents are allergic Has patient had a PCN reaction causing immediate rash, facial/tongue/throat swelling, SOB or lightheadedness with hypotension: Yes Has patient had a PCN reaction causing severe rash involving mucus membranes or skin necrosis: Unk Has patient had a PCN reaction that required hospitalization: Unk Has patient had a PCN reaction occurring within the last 10 years: No If all of the above answers are "NO", then may proceed with Cephalosporin use.No    Tape Rash    Review of Systems  Constitutional:  Positive for fever.       Objective:    Physical Exam Constitutional:      General: She is not in acute distress.    Appearance: Normal appearance. She is not ill-appearing or toxic-appearing.  Pulmonary:     Effort: Pulmonary effort is normal.  Neurological:     General: No focal deficit present.     Mental Status: She is alert and oriented to person,  place, and time. Mental status is at baseline.  Psychiatric:        Mood and Affect: Mood normal.        Behavior: Behavior normal.        Thought Content: Thought content normal.        Judgment: Judgment normal.     Pulse (!) 111   Temp 99.6 F (37.6 C)   Ht '5\' 5"'$  (1.651 m)   Wt 200 lb 3 oz (90.8 kg)   LMP 01/27/2000   SpO2 94%   BMI 33.31 kg/m  Wt Readings from Last 3 Encounters:  11/28/21 200 lb 3 oz (90.8 kg)  11/14/21 200 lb 3.2 oz (90.8 kg)  10/23/21 198 lb 8 oz (90 kg)       Assessment & Plan:   Problem List Items Addressed This Visit       Other   Upper airway cough syndrome   Relevant Medications   TRELEGY ELLIPTA 200-62.5-25 MCG/ACT AEPB   albuterol (VENTOLIN HFA) 108 (90 Base) MCG/ACT inhaler   azithromycin (ZITHROMAX) 250 MG tablet   COVID-19 - Primary    Acute, worsening Pt considered high risk for hospitalization. have decided pt is a candidate for antiviral and pt agrees that she would like to take this. I have sent in RX for molnupiravir 200 mg capsules to be taken as directed. Pdmp reviewed. Rx zpack, advised pt to use if symptoms do not improve in the next few days.  Hesitant to send  in rx for prednisone right now due to risk of immunosuppression and replication of covid virus.  Rx guai codeine and tessalon perrles 200 mg prn cough Rx albuterol to give unexpired inhaler Advised pt can use nebulzier if needed Continue with treligy, refill sent in . Advised of CDC guidelines for self isolation/ ending isolation.  Advised of safe practice guidelines. Symptom Tier reviewed.  Encouraged to monitor for any worsening symptoms; watch for increased shortness of breath, weakness, and signs of dehydration. Advised when to seek emergency care.  Instructed to rest and hydrate well.  Advised to leave the house during recommended isolation period, only if it is necessary to seek medical care       Relevant Medications   molnupiravir EUA (LAGEVRIO) 200 mg CAPS  capsule   azithromycin (ZITHROMAX) 250 MG tablet   Other Visit Diagnoses     Acute cough       Relevant Medications   benzonatate (TESSALON) 100 MG capsule   albuterol (VENTOLIN HFA) 108 (90 Base) MCG/ACT inhaler       I have discontinued Candiss Norse. Butzer's albuterol, Dextromethorphan Polistirex (DELSYM PO), and ipratropium. I am also having her start on molnupiravir EUA, benzonatate, albuterol, and azithromycin. Additionally, I am having her maintain her Biotin, cholecalciferol, Flutter, chlorpheniramine, pantoprazole, budesonide, metroNIDAZOLE, diltiazem, amitriptyline, famotidine, and Trelegy Ellipta.  Meds ordered this encounter  Medications   molnupiravir EUA (LAGEVRIO) 200 mg CAPS capsule    Sig: Take 4 capsules (800 mg total) by mouth 2 (two) times daily for 5 days.    Dispense:  40 capsule    Refill:  0    Order Specific Question:   Supervising Provider    Answer:   BEDSOLE, AMY E [[id number]]   benzonatate (TESSALON) 100 MG capsule    Sig: Take 2 capsules (200 mg total) by mouth 3 (three) times daily as needed for cough.    Dispense:  20 capsule    Refill:  0    Order Specific Question:   Supervising Provider    Answer:   BEDSOLE, AMY E [[id number]]   TRELEGY ELLIPTA 200-62.5-25 MCG/ACT AEPB    Sig: Inhale 1 puff into the lungs daily.    Dispense:  1 each    Refill:  5    Order Specific Question:   Supervising Provider    Answer:   BEDSOLE, AMY E [[id number]]   albuterol (VENTOLIN HFA) 108 (90 Base) MCG/ACT inhaler    Sig: Inhale 2 puffs into the lungs every 6 (six) hours as needed for wheezing or shortness of breath.    Dispense:  8 g    Refill:  0    Order Specific Question:   Supervising Provider    Answer:   BEDSOLE, AMY E [[id number]]   azithromycin (ZITHROMAX) 250 MG tablet    Sig: Take 2 tablets on day 1, then 1 tablet daily on days 2 through 5    Dispense:  6 tablet    Refill:  0    Order Specific Question:   Supervising Provider    Answer:   BEDSOLE, AMY E [[id number]]    I  discussed the assessment and treatment plan with the patient. The patient was provided an opportunity to ask questions and all were answered. The patient agreed with the plan and demonstrated an understanding of the instructions.   The patient was advised to call back or seek an in-person evaluation if the symptoms worsen or if the condition fails to improve as anticipated.  I provided 15 minutes of face-to-face time during this encounter.   Eugenia Pancoast, [place] [facility] [phone] (phone) [phone] (fax)  [place]

## 2021-11-28 NOTE — Patient Instructions (Signed)
Your COVID test is positive. You should remain isolated and quarantine for at least 5 days from start of symptoms. You must be feeling better and be fever free without any fever reducers for at least 24 hours as well. You should wear a mask at all times when out of your home or around others for 5 days after leaving isolation.  Your household contacts should be tested as well as work contacts. If you feel worse or have increasing shortness of breath, you should be seen in person at urgent care or the emergency room.    Regards,   Eugenia Pancoast FNP-C

## 2021-12-20 ENCOUNTER — Other Ambulatory Visit: Payer: Self-pay | Admitting: Family

## 2021-12-20 DIAGNOSIS — R058 Other specified cough: Secondary | ICD-10-CM

## 2021-12-20 DIAGNOSIS — R051 Acute cough: Secondary | ICD-10-CM

## 2022-01-17 ENCOUNTER — Ambulatory Visit (INDEPENDENT_AMBULATORY_CARE_PROVIDER_SITE_OTHER): Payer: BC Managed Care – PPO | Admitting: Family Medicine

## 2022-01-17 ENCOUNTER — Encounter: Payer: Self-pay | Admitting: Family Medicine

## 2022-01-17 VITALS — BP 118/82 | HR 107 | Ht 65.0 in | Wt 201.0 lb

## 2022-01-17 DIAGNOSIS — R232 Flushing: Secondary | ICD-10-CM | POA: Insufficient documentation

## 2022-01-17 DIAGNOSIS — N898 Other specified noninflammatory disorders of vagina: Secondary | ICD-10-CM | POA: Diagnosis not present

## 2022-01-17 MED ORDER — ESTRADIOL 10 MCG VA TABS: 1.0000 | ORAL_TABLET | VAGINAL | 12 refills | Status: DC

## 2022-01-17 NOTE — Progress Notes (Signed)
New GYN  Former [facility] Patient  Hysterectomy  Mammogram: 10/26/20 Has appt scheduled w/ [facility] on Friday.   Patient wants to discuss Pre menopause/menopause notes Joint pain, hip pain, night sweats, mood swings  Notes vaginal dryness during intercourse.   Fun Fact: Patient loves crafting. Daughter works for [employer] and handles [employer] account just got to go to [employer] to spend time together.

## 2022-01-17 NOTE — Progress Notes (Signed)
    Subjective:    Patient ID: Cassie [name] is a 52 y.o. female presenting with Establish Care  on 01/17/2022  HPI: S/p TAH for severe endometriosis in 2002. Has long COVID. Has severe fatigue, achy joints, hips, vaginal dryness.  Has some acne, having hot flashes, night sweats. Also having mood swings. Has been going on for 2-3 years. Seems to be worsening.  Review of Systems  Constitutional:  Negative for chills and fever.  Respiratory:  Negative for shortness of breath.   Cardiovascular:  Negative for chest pain.  Gastrointestinal:  Negative for abdominal pain, nausea and vomiting.  Genitourinary:  Negative for dysuria.  Skin:  Negative for rash.      Objective:    BP 118/82   Pulse (!) 107   Ht '5\' 5"'$  (1.651 m)   Wt 201 lb (91.2 kg)   LMP 01/27/2000   BMI 33.45 kg/m  Physical Exam Exam conducted with a chaperone present.  Constitutional:      General: She is not in acute distress.    Appearance: She is well-developed.  HENT:     Head: Normocephalic and atraumatic.  Eyes:     General: No scleral icterus. Cardiovascular:     Rate and Rhythm: Normal rate.  Pulmonary:     Effort: Pulmonary effort is normal.  Abdominal:     Palpations: Abdomen is soft.  Musculoskeletal:     Cervical back: Neck supple.  Skin:    General: Skin is warm and dry.  Neurological:     Mental Status: She is alert and oriented to person, place, and time.         Assessment & Plan:   Problem List Items Addressed This Visit       Unprioritized   Hot flashes - Primary    Discussed that this sounds like menopause. Check FSH to ensure. Advised of HRT risks and benefits and alternatives to treatment like Effexor, Klonopin, Black Cohash, Lifestyle changes.  Discussed bone loss, risk of DVT, risk of breast cancer and effect on dementia. Would not need any progesterone due to absent uterus.  Also discussed changes related to menopause, normal progression and resolution of symptoms. Given her  risk of DVT due to COVID, she prefers to avoid HRT. She, also, would prefer no SRNIs or SSRIs due to prior poor results. She will work on lifestyle changes for now.       Relevant Orders   Follicle stimulating hormone   Other Visit Diagnoses     Vaginal dryness       given low rate of absorption, she will try Vagifem 2x/week for vaginal dryness. Good water or silicone based lube.   Relevant Medications   Estradiol 10 MCG TABS vaginal tablet (Start on 01/18/2022)     Does not need pap smears Has mammogram scheduled this week.   Return in about 1 year (around 01/18/2023), or if symptoms worsen or fail to improve.  Donnamae Jude, MD 01/17/2022 2:36 PM

## 2022-01-17 NOTE — Assessment & Plan Note (Signed)
Discussed that this sounds like menopause. Check FSH to ensure. Advised of HRT risks and benefits and alternatives to treatment like Effexor, Klonopin, Black Cohash, Lifestyle changes.  Discussed bone loss, risk of DVT, risk of breast cancer and effect on dementia. Would not need any progesterone due to absent uterus.  Also discussed changes related to menopause, normal progression and resolution of symptoms. Given her risk of DVT due to COVID, she prefers to avoid HRT. She, also, would prefer no SRNIs or SSRIs due to prior poor results. She will work on lifestyle changes for now.

## 2022-01-18 LAB — FOLLICLE STIMULATING HORMONE: FSH: 51.1 m[IU]/mL

## 2022-01-19 ENCOUNTER — Ambulatory Visit
Admission: RE | Admit: 2022-01-19 | Discharge: 2022-01-19 | Disposition: A | Discharge status: Home / Self Care | Payer: BC Managed Care – PPO | Source: Ambulatory Visit | Attending: Family Medicine | Admitting: Family Medicine

## 2022-01-19 DIAGNOSIS — Z1231 Encounter for screening mammogram for malignant neoplasm of breast: Secondary | ICD-10-CM | POA: Diagnosis not present

## 2022-02-22 DIAGNOSIS — D1039 Benign neoplasm of other parts of mouth: Secondary | ICD-10-CM | POA: Diagnosis not present

## 2022-02-22 DIAGNOSIS — D3709 Neoplasm of uncertain behavior of other specified sites of the oral cavity: Secondary | ICD-10-CM | POA: Diagnosis not present

## 2022-03-27 ENCOUNTER — Telehealth: Payer: Self-pay | Admitting: Cardiology

## 2022-03-27 NOTE — Telephone Encounter (Signed)
STAT if HR is under 50 or over 120 (normal HR is 60-100 beats per minute)  What is your heart rate?   HR 107 (while on phone)  Do you have a log of your heart rate readings (document readings)?   No but she wears an Apple watch  Do you have any other symptoms?   Patient stated she has long-term COVID symptoms and she is concerned about the dips in her heart rate.  Patient stated her heart rate has been showing on her watch at 120 for more than 10 minutes three times over the weekend and has been continuing.  Patient states her rates yesterday went from 44 to 128.

## 2022-03-27 NOTE — Telephone Encounter (Signed)
Called patient- has had problems since having Covid 2020. Has Long-Covid  Pt reports that her palpitations have been "pretty well controlled," And since Saturday there have been fluctuations again- her apple watch has been alarming. It has been 44-128.  Right now- pt states she is around 107-110 just sitting- no activity. She states that her resting hr has mostly been about 88-100. Now she is having the lows and highs again.  She states that she was so tired on Sunday that she didn't even get dressed. Just really concerned about having "the swings again, down low, up high." No dizziness, no blurred vision,   She states that she was more short of breath on Sun/Monday- not as bad now. No chest pain/back/arm/jaw, no sweating, no swelling.  Reports staying well hydrated, no changes in her salt/sugar/or caffeine intake. She is afraid to do much activity, does not want to strain herself- stress her heart out.   Asked her to look at watch to see if it has EKG reading? She just set it up- EKG at this time says NSR- average 96.   Now she will be able to hit the button to read EKG if/when she is feeling like her heartrate is fluctuating.   Given ER precautions and told that I would send this info to her provider for recommendations.

## 2022-03-30 NOTE — Telephone Encounter (Signed)
Minus Breeding, MD  Sharee Holster, RN Caller: Unspecified (3 days ago, 10:57 AM) I would probably get her in to be seen this week or early next week.  Thanks.  Called patient, gave the above message, she said she is feeling a little better than she did a few days ago. Her heartrate is still fluctuating. No shortness of breath or dizziness. Got her in for an appointment with Dr Percival Spanish 04/05/22 at 230. Given ER precautions. Patient verbalized understanding.

## 2022-04-04 DIAGNOSIS — I10 Essential (primary) hypertension: Secondary | ICD-10-CM | POA: Insufficient documentation

## 2022-04-04 NOTE — Progress Notes (Unsigned)
Cardiology Office Note:   Date:  04/05/2022  ID:  Cassie [name], DOB 02-Sep-1970, MRN [id number]  History of Present Illness:   Cassie [name] is a 52 y.o. female who presents for evaluation of dizziness.  She had chest pain in the past.  She had normal coronary arteries on cardiac catheterization in 2006.  Myoview obtained on 02/10/2016 was low risk with EF of 78%, no reversible ischemia or infarction.  Echocardiogram obtained on the same day showed EF 60 to 65%, no significant valve disorder.  The patient was referred to cardiology service for evaluation of palpitation and dizziness.  She was diagnosed with COVID-19 in August 2020.  Since her diagnosis, she has had persistent dizziness upon standing.  She also had racing heartbeat that fluctuate to low heart rate.   Repeat echocardiogram obtained on 02/11/2019 showed EF 60 to 65%, normal pulmonary artery pressure, no significant valve disorder.  3 days ZIO monitor did not show significant arrhythmia. She wore a 14 day monitor and had rare supraventricular beats.  Unfortunately she is having "long haul" symptoms from her Covid infection.    She was seen once in the [facility] clinic.  I treated her with Cardizem CD.  Inflammatory markers including hs trop were negative.   She had an appt with our pulmonary.   They did do a CT without any acute findings.  She did have normal C-reactive protein, sed rate, troponin and BNP.    She called recently with with fluctuating heart rates.  She gives me lots of good data from her Watch.  She will be at rest or at work at home not doing anything in particular when she has high rate alarms.  I was able to review strips where she goes into the 120s 130s sometimes for 10 minutes at a time and it appears to be sinus tachycardia.  She has feels the sudden changes.  She gets fatigued.  She does not have any presyncope or syncope.  She has some mild orthostatic symptoms.  She is not describing new chest pressure, neck or arm  discomfort.  She is still unable to get out and do much exercise because of post-COVID fatigue.  She had COVID again in December.    ROS:   As stated in the HPI and negative for all other systems.   Studies Reviewed:    EKG: Sinus rhythm, rate 106, axis within normal limits, intervals within normal limits, no acute ST-T wave changes.   Risk Assessment/Calculations:              Physical Exam:   VS:  BP 112/78   Pulse (!) 106   Ht 5' 4.5" (1.638 m)   Wt 211 lb (95.7 kg)   LMP 01/27/2000   SpO2 98%   BMI 35.66 kg/m    Wt Readings from Last 3 Encounters:  04/05/22 211 lb (95.7 kg)  01/17/22 201 lb (91.2 kg)  11/28/21 200 lb 3 oz (90.8 kg)     GEN: Well nourished, well developed in no acute distress NECK: No JVD; No carotid bruits CARDIAC: RRR, no murmurs, rubs, gallops RESPIRATORY:  Clear to auscultation without rales, wheezing or rhonchi  ABDOMEN: Soft, non-tender, non-distended EXTREMITIES:  No edema; No deformity   ASSESSMENT AND PLAN:   PALPITATIONS:    She is having some increased tachypalpitations that seem to be sinus tachycardia not provoked by activity or change in position.  I am going to try a low-dose of a beta  selective beta-blocker bisoprolol 2.5 mg daily to be added to her Cardizem.  She is up-to-date with recent labs and thyroid that was normal recently.  Of note she is going through menopause which could be contributing.   HTN: Blood pressure is well-controlled today and actually on the lower side but has been elevated previously.        Signed, Minus Breeding, MD

## 2022-04-05 ENCOUNTER — Ambulatory Visit: Payer: BC Managed Care – PPO | Attending: Cardiology | Admitting: Cardiology

## 2022-04-05 ENCOUNTER — Encounter: Payer: Self-pay | Admitting: Cardiology

## 2022-04-05 VITALS — BP 112/78 | HR 106 | Ht 64.5 in | Wt 211.0 lb

## 2022-04-05 DIAGNOSIS — R002 Palpitations: Secondary | ICD-10-CM

## 2022-04-05 DIAGNOSIS — I10 Essential (primary) hypertension: Secondary | ICD-10-CM | POA: Diagnosis not present

## 2022-04-05 MED ORDER — BISOPROLOL FUMARATE 5 MG PO TABS: 2.5000 mg | ORAL_TABLET | Freq: Every day | ORAL | 3 refills | Status: DC

## 2022-04-05 NOTE — Patient Instructions (Addendum)
Medication Instructions:  Your physician has recommended you make the following change in your medication:   -Start bisoprolol (zebeta) 2.5mg  (half tablet) 2.5mg  once daily.  *If you need a refill on your cardiac medications before your next appointment, please call your pharmacy*   Follow-Up: At [facility], you and your health needs are our priority.  As part of our continuing mission to provide you with exceptional heart care, we have created designated Provider Care Teams.  These Care Teams include your primary Cardiologist (physician) and Advanced Practice Providers (APPs -  Physician Assistants and Nurse Practitioners) who all work together to provide you with the care you need, when you need it.  We recommend signing up for the patient portal called "MyChart".  Sign up information is provided on this After Visit Summary.  MyChart is used to connect with patients for Virtual Visits (Telemedicine).  Patients are able to view lab/test results, encounter notes, upcoming appointments, etc.  Non-urgent messages can be sent to your provider as well.   To learn more about what you can do with MyChart, go to [email or web address].    Your next appointment:   6 month(s)  Provider:   Minus Breeding, MD

## 2022-07-10 DIAGNOSIS — Z6834 Body mass index (BMI) 34.0-34.9, adult: Secondary | ICD-10-CM | POA: Diagnosis not present

## 2022-07-10 DIAGNOSIS — R0609 Other forms of dyspnea: Secondary | ICD-10-CM | POA: Diagnosis not present

## 2022-07-10 DIAGNOSIS — U099 Post covid-19 condition, unspecified: Secondary | ICD-10-CM | POA: Diagnosis not present

## 2022-07-10 DIAGNOSIS — R053 Chronic cough: Secondary | ICD-10-CM | POA: Diagnosis not present

## 2022-07-17 ENCOUNTER — Other Ambulatory Visit: Payer: Self-pay | Admitting: Cardiology

## 2022-08-07 ENCOUNTER — Ambulatory Visit: Payer: BC Managed Care – PPO | Admitting: Dermatology

## 2022-08-07 ENCOUNTER — Encounter: Payer: Self-pay | Admitting: Dermatology

## 2022-08-07 VITALS — BP 115/74 | HR 94

## 2022-08-07 DIAGNOSIS — W908XXA Exposure to other nonionizing radiation, initial encounter: Secondary | ICD-10-CM

## 2022-08-07 DIAGNOSIS — L814 Other melanin hyperpigmentation: Secondary | ICD-10-CM

## 2022-08-07 DIAGNOSIS — L820 Inflamed seborrheic keratosis: Secondary | ICD-10-CM

## 2022-08-07 DIAGNOSIS — L578 Other skin changes due to chronic exposure to nonionizing radiation: Secondary | ICD-10-CM | POA: Diagnosis not present

## 2022-08-07 DIAGNOSIS — D2261 Melanocytic nevi of right upper limb, including shoulder: Secondary | ICD-10-CM

## 2022-08-07 DIAGNOSIS — D485 Neoplasm of uncertain behavior of skin: Secondary | ICD-10-CM

## 2022-08-07 DIAGNOSIS — D225 Melanocytic nevi of trunk: Secondary | ICD-10-CM | POA: Diagnosis not present

## 2022-08-07 DIAGNOSIS — Z808 Family history of malignant neoplasm of other organs or systems: Secondary | ICD-10-CM

## 2022-08-07 DIAGNOSIS — L821 Other seborrheic keratosis: Secondary | ICD-10-CM

## 2022-08-07 DIAGNOSIS — Z1283 Encounter for screening for malignant neoplasm of skin: Secondary | ICD-10-CM

## 2022-08-07 DIAGNOSIS — D229 Melanocytic nevi, unspecified: Secondary | ICD-10-CM

## 2022-08-07 DIAGNOSIS — L719 Rosacea, unspecified: Secondary | ICD-10-CM

## 2022-08-07 DIAGNOSIS — D1801 Hemangioma of skin and subcutaneous tissue: Secondary | ICD-10-CM

## 2022-08-07 MED ORDER — DOXYCYCLINE 40 MG PO CPDR: 40.0000 mg | DELAYED_RELEASE_CAPSULE | ORAL | 5 refills | Status: DC

## 2022-08-07 MED ORDER — IVERMECTIN 1 % EX CREA: 1.0000 | TOPICAL_CREAM | Freq: Every day | CUTANEOUS | 4 refills | Status: DC

## 2022-08-07 NOTE — Patient Instructions (Addendum)
Patient Handout: Wound Care for Skin Biopsy Site  Patient Handout: Wound Care for Skin Biopsy Site  Taking Care of Your Skin Biopsy Site  Proper care of the biopsy site is essential for promoting healing and minimizing scarring. This handout provides instructions on how to care for your biopsy site to ensure optimal recovery.  1. Cleaning the Wound:  Clean the biopsy site daily with gentle soap and water. Gently pat the area dry with a clean, soft towel. Avoid harsh scrubbing or rubbing the area, as this can irritate the skin and delay healing.  2. Applying Aquaphor and Bandage:  After cleaning the wound, apply a thin layer of Aquaphor ointment to the biopsy site. Cover the area with a sterile bandage to protect it from dirt, bacteria, and friction. Change the bandage daily or as needed if it becomes soiled or wet.  3. Continued Care for One Week:  Repeat the cleaning, Aquaphor application, and bandaging process daily for one week following the biopsy procedure. Keeping the wound clean and moist during this initial healing period will help prevent infection and promote optimal healing.  4. Massaging Aquaphor into the Area:  ---After one week, discontinue the use of bandages but continue to apply Aquaphor to the biopsy site. ----Gently massage the Aquaphor into the area using circular motions. ---Massaging the skin helps to promote circulation and prevent the formation of scar tissue.   Additional Tips:  Avoid exposing the biopsy site to direct sunlight during the healing process, as this can cause hyperpigmentation or worsen scarring. If you experience any signs of infection, such as increased redness, swelling, warmth, or drainage from the wound, contact your healthcare provider immediately. Follow any additional instructions provided by your healthcare provider for caring for the biopsy site and managing any discomfort. Conclusion:  Taking proper care of your skin biopsy site  is crucial for ensuring optimal healing and minimizing scarring. By following these instructions for cleaning, applying Aquaphor, and massaging the area, you can promote a smooth and successful recovery. If you have any questions or concerns about caring for your biopsy site, don't hesitate to contact your healthcare provider for guidance.    Cryotherapy Aftercare  Wash gently with soap and water everyday.   Apply Vaseline and Band-Aid daily until healed.    Rosacea is a chronic progressive skin condition usually affecting the face of adults, causing redness and/or acne bumps. It is treatable but not curable. It sometimes affects the eyes (ocular rosacea) as well. It may respond to topical and/or systemic medication and can flare with stress, sun exposure, alcohol, exercise, topical steroids (including hydrocortisone/cortisone 10) and some foods.  Daily application of broad spectrum spf 30+ sunscreen to face is recommended to reduce flares.  Due to recent changes in healthcare laws, you may see results of your pathology and/or laboratory studies on MyChart before the doctors have had a chance to review them. We understand that in some cases there may be results that are confusing or concerning to you. Please understand that not all results are received at the same time and often the doctors may need to interpret multiple results in order to provide you with the best plan of care or course of treatment. Therefore, we ask that you please give [place] 2 business days to thoroughly review all your results before contacting the office for clarification. Should we see a critical lab result, you will be contacted sooner.  Treatment Plan  Oracea 40mg  take 1 tab daily Soolantra cream apply  to face at night  If You Need Anything After Your Visit  If you have any questions or concerns for your doctor, please call our main line at [phone] If no one answers, please leave a voicemail as directed and we will  return your call as soon as possible. Messages left after 4 pm will be answered the following business day.   You may also send [place] a message via MyChart. We typically respond to MyChart messages within 1-2 business days.  For prescription refills, please ask your pharmacy to contact our office. Our fax number is [phone].  If you have an urgent issue when the clinic is closed that cannot wait until the next business day, you can page your doctor at the number below.    Please note that while we do our best to be available for urgent issues outside of office hours, we are not available 24/7.   If you have an urgent issue and are unable to reach [place], you may choose to seek medical care at your doctor's office, retail clinic, urgent care center, or emergency room.  If you have a medical emergency, please immediately call 911 or go to the emergency department. In the event of inclement weather, please call our main line at [phone] for an update on the status of any delays or closures.  Dermatology Medication Tips: Please keep the boxes that topical medications come in in order to help keep track of the instructions about where and how to use these. Pharmacies typically print the medication instructions only on the boxes and not directly on the medication tubes.   If your medication is too expensive, please contact our office at [phone] or send [place] a message through MyChart.   We are unable to tell what your co-pay for medications will be in advance as this is different depending on your insurance coverage. However, we may be able to find a substitute medication at lower cost or fill out paperwork to get insurance to cover a needed medication.   If a prior authorization is required to get your medication covered by your insurance company, please allow [place] 1-2 business days to complete this process.  Drug prices often vary depending on where the prescription is filled and some pharmacies  may offer cheaper prices.  The website www.goodrx.com contains coupons for medications through different pharmacies. The prices here do not account for what the cost may be with help from insurance (it may be cheaper with your insurance), but the website can give you the price if you did not use any insurance.  - You can print the associated coupon and take it with your prescription to the pharmacy.  - You may also stop by our office during regular business hours and pick up a GoodRx coupon card.  - If you need your prescription sent electronically to a different pharmacy, notify our office through [facility] or by phone at [phone]

## 2022-08-07 NOTE — Progress Notes (Signed)
Follow-Up Visit   Subjective  Cassie [name] is a 52 y.o. female who presents for the following: Skin Cancer Screening and Full Body Skin Exam Pt has no hx of skin cancer but there is a family hx of skin cancer.  Pt has spot on lt lower leg for a couple months. She tends to hit it when she shaves so it doesn't heal up.   The patient presents for Total-Body Skin Exam (TBSE) for skin cancer screening and mole check. The patient has spots, moles and lesions to be evaluated, some may be new or changing and the patient may have concern these could be cancer.   The following portions of the chart were reviewed this encounter and updated as appropriate: medications, allergies, medical history  Review of Systems:  No other skin or systemic complaints except as noted in HPI or Assessment and Plan.  Objective  Well appearing patient in no apparent distress; mood and affect are within normal limits.  A full examination was performed including scalp, head, eyes, ears, nose, lips, neck, chest, axillae, abdomen, back, buttocks, bilateral upper extremities, bilateral lower extremities, hands, feet, fingers, toes, fingernails, and toenails. All findings within normal limits unless otherwise noted below.   Relevant physical exam findings are noted in the Assessment and Plan.      Left lateral pretibial Inflamed SK vs wart--pink scaly papule  Right Breast 5mm  dark gray brown macule        Right forearm 6mm pink flesh papule  Assessment & Plan   SKIN CANCER SCREENING PERFORMED TODAY.    Actinic Damage on chest - chronic, secondary to cumulative UV radiation exposure/sun exposure over time - diffuse scaly erythematous macules with underlying dyspigmentation - Recommend daily broad spectrum sunscreen SPF 30+ to sun-exposed areas, reapply every 2 hours as needed.  - Recommend staying in the shade or wearing long sleeves, sun glasses (UVA+UVB protection) and wide brim hats (4-inch  brim around the entire circumference of the hat). - Call for new or changing lesions.   Lentigines on back and shoulders - Scattered tan macules - Due to sun exposure - Benign-appering, observe - Recommend daily broad spectrum sunscreen SPF 30+ to sun-exposed areas, reapply every 2 hours as needed. - Call for any changes   Hemangiomas - Red papules - Discussed benign nature - Observe - Call for any changes   Seborrheic Keratoses - Stuck-on, waxy, tan-brown papules and/or plaques  - Benign-appearing - Discussed benign etiology and prognosis. - Observe - Call for any changes   Melanocytic Nevi - Tan-brown and/or pink-flesh-colored symmetric macules and papules - Benign appearing on exam today - Observation - Call clinic for new or changing moles - Recommend daily use of broad spectrum spf 30+ sunscreen to sun-exposed areas.   Nevus (2) L upper abdomen 0.8 x 0.4 cm brown macule darker center, right forearm pink flesh papule   Benign-appearing.  Observation.  Call clinic for new or changing moles.  Recommend daily use of broad spectrum spf 30+ sunscreen to sun-exposed areas.     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 Condition is bothersome/symptomatic for patient. Currently flared.   Rosacea is a chronic progressive skin condition usually affecting the face of adults, causing redness and/or acne bumps. It is treatable but not curable. It sometimes affects the eyes (ocular rosacea) as well. It may respond to topical and/or systemic  medication and can flare with stress, sun exposure, alcohol, exercise, topical steroids (including hydrocortisone/cortisone 10) and some foods.  Daily application of broad spectrum spf 30+ sunscreen to face is recommended to reduce flares.  Treatment Plan:  D/C metrocream Start Oracea 40mg  take 1 tab  daily Start Soolantra cream apply to face at night   Inflamed seborrheic keratosis Left lateral pretibial  Symptomatic, irritating, patient would like treated.   Destruction of lesion - Left lateral pretibial  Destruction method: cryotherapy   Informed consent: discussed and consent obtained   Lesion destroyed using liquid nitrogen: Yes   Region frozen until ice ball extended beyond lesion: Yes   Outcome: patient tolerated procedure well with no complications   Post-procedure details: wound care instructions given   Additional details:  Prior to procedure, discussed risks of blister formation, small wound, skin dyspigmentation, or rare scar following cryotherapy. Recommend Vaseline ointment to treated areas while healing.   Neoplasm of uncertain behavior of skin Right Breast  Epidermal / dermal shaving  Lesion diameter (cm):  0.6 Informed consent: discussed and consent obtained   Patient was prepped and draped in usual sterile fashion: Area prepped with alcohol. Anesthesia: the lesion was anesthetized in a standard fashion   Anesthetic:  1% lidocaine w/ epinephrine 1-100,000 buffered w/ 8.4% NaHCO3 Instrument used: flexible razor blade   Hemostasis achieved with: pressure, aluminum chloride and electrodesiccation   Outcome: patient tolerated procedure well   Post-procedure details: wound care instructions given   Post-procedure details comment:  Ointment and small bandage applied  Specimen 1 - Surgical pathology Differential Diagnosis: Blue nevus R/O atypia  Check Margins: No   Return in about 1 year (around 08/07/2023) for TBSE.  I, Tillie Fantasia, CMA, am acting as scribe for Willeen Niece, MD.   Documentation: I have reviewed the above documentation for accuracy and completeness, and I agree with the above.  Willeen Niece, MD

## 2022-08-13 ENCOUNTER — Telehealth: Payer: Self-pay

## 2022-08-13 NOTE — Telephone Encounter (Signed)
Patient called regarding BX done on her breast last week. Patient states she has been doing wound care and keeping area covered but it is now very red and oozing.  Patient did take photo if you need to see it.

## 2022-08-14 ENCOUNTER — Ambulatory Visit: Payer: BC Managed Care – PPO | Admitting: Dermatology

## 2022-08-14 ENCOUNTER — Telehealth: Payer: Self-pay

## 2022-08-14 VITALS — BP 120/84 | HR 95

## 2022-08-14 DIAGNOSIS — L98499 Non-pressure chronic ulcer of skin of other sites with unspecified severity: Secondary | ICD-10-CM

## 2022-08-14 MED ORDER — MUPIROCIN 2 % EX OINT: TOPICAL_OINTMENT | CUTANEOUS | 0 refills | Status: DC

## 2022-08-14 NOTE — Telephone Encounter (Signed)
Advised pt of bx results/sh ?

## 2022-08-14 NOTE — Patient Instructions (Signed)
Due to recent changes in healthcare laws, you may see results of your pathology and/or laboratory studies on MyChart before the doctors have had a chance to review them. We understand that in some cases there may be results that are confusing or concerning to you. Please understand that not all results are received at the same time and often the doctors may need to interpret multiple results in order to provide you with the best plan of care or course of treatment. Therefore, we ask that you please give [place] 2 business days to thoroughly review all your results before contacting the office for clarification. Should we see a critical lab result, you will be contacted sooner.   If You Need Anything After Your Visit  If you have any questions or concerns for your doctor, please call our main line at [phone] and press option 4 to reach your doctor's medical assistant. If no one answers, please leave a voicemail as directed and we will return your call as soon as possible. Messages left after 4 pm will be answered the following business day.   You may also send [place] a message via MyChart. We typically respond to MyChart messages within 1-2 business days.  For prescription refills, please ask your pharmacy to contact our office. Our fax number is [phone].  If you have an urgent issue when the clinic is closed that cannot wait until the next business day, you can page your doctor at the number below.    Please note that while we do our best to be available for urgent issues outside of office hours, we are not available 24/7.   If you have an urgent issue and are unable to reach [place], you may choose to seek medical care at your doctor's office, retail clinic, urgent care center, or emergency room.  If you have a medical emergency, please immediately call 911 or go to the emergency department.  Pager Numbers  - Dr. Gwen Pounds: [phone]  - Dr. Roseanne Reno: [phone]  - Dr. Katrinka Blazing: [phone]    In the event of inclement weather, please call our main line at [phone] for an update on the status of any delays or closures.  Dermatology Medication Tips: Please keep the boxes that topical medications come in in order to help keep track of the instructions about where and how to use these. Pharmacies typically print the medication instructions only on the boxes and not directly on the medication tubes.   If your medication is too expensive, please contact our office at [phone] option 4 or send [place] a message through MyChart.   We are unable to tell what your co-pay for medications will be in advance as this is different depending on your insurance coverage. However, we may be able to find a substitute medication at lower cost or fill out paperwork to get insurance to cover a needed medication.   If a prior authorization is required to get your medication covered by your insurance company, please allow [place] 1-2 business days to complete this process.  Drug prices often vary depending on where the prescription is filled and some pharmacies may offer cheaper prices.  The website www.goodrx.com contains coupons for medications through different pharmacies. The prices here do not account for what the cost may be with help from insurance (it may be cheaper with your insurance), but the website can give you the price if you did not use any insurance.  - You can print the associated coupon and take it  with your prescription to the pharmacy.  - You may also stop by our office during regular business hours and pick up a GoodRx coupon card.  - If you need your prescription sent electronically to a different pharmacy, notify our office through [facility] or by phone at [phone] option 4.     Si Usted Necesita Algo Despus de Su Visita  Tambin puede enviarnos un mensaje a travs de Clinical cytogeneticist. Por lo general respondemos a los mensajes de MyChart en el transcurso de 1 a 2 das  hbiles.  Para renovar recetas, por favor pida a su farmacia que se ponga en contacto con nuestra oficina. Annie Sable de fax es [place] [phone].  Si tiene un asunto urgente cuando la clnica est cerrada y que no puede esperar hasta el siguiente da hbil, puede llamar/localizar a su doctor(a) al nmero que aparece a continuacin.   Por favor, tenga en cuenta que aunque hacemos todo lo posible para estar disponibles para asuntos urgentes fuera del horario de [place], no estamos disponibles las 24 horas del da, los 7 [address]  [address] de la [place].   Si tiene un problema urgente y no puede comunicarse con nosotros, puede optar por buscar atencin mdica  en el consultorio de su doctor(a), en una clnica privada, en un centro de atencin urgente o en una sala de emergencias.  Si tiene Engineer, drilling, por favor llame inmediatamente al 911 o vaya a la sala de emergencias.  Nmeros de bper  - Dr. Gwen Pounds: [phone]  - Dra. Roseanne Reno: [phone]  - Dr. Katrinka Blazing: [phone]   En caso de inclemencias del tiempo, por favor llame a Lacy Duverney principal al [phone] para una actualizacin sobre el [place] de cualquier retraso o cierre.  Consejos para la medicacin en dermatologa: Por favor, guarde las cajas en las que vienen los medicamentos de uso tpico para ayudarle a seguir las instrucciones sobre dnde y cmo usarlos. Las farmacias generalmente imprimen las instrucciones del medicamento slo en las cajas y no directamente en los tubos del [place].   Si su medicamento es muy caro, por favor, pngase en contacto con Rolm Gala llamando al [phone] y presione la opcin 4 o envenos un mensaje a travs de Clinical cytogeneticist.   No podemos decirle cul ser su copago por los medicamentos por adelantado ya que esto es diferente dependiendo de la cobertura de su seguro. Sin embargo, es posible que podamos encontrar un medicamento sustituto a Audiological scientist un formulario para que el  seguro cubra el medicamento que se considera necesario.   Si se requiere una autorizacin previa para que su compaa de seguros [place] su medicamento, por favor permtanos de 1 a 2 das hbiles para completar [address]  Los precios de los medicamentos varan con frecuencia dependiendo del Environmental consultant de dnde se surte la receta y alguna farmacias pueden ofrecer precios ms baratos.  El sitio web www.goodrx.com tiene cupones para medicamentos de Health and safety inspector. Los precios aqu no tienen en cuenta lo que podra costar con la ayuda del seguro (puede ser ms barato con su seguro), pero el sitio web puede darle el precio si no utiliz Tourist information centre manager.  - Puede imprimir el cupn correspondiente y llevarlo con su receta a la farmacia.  - Tambin puede pasar por nuestra oficina durante el horario de atencin regular y Education officer, museum una tarjeta de cupones de GoodRx.  - Si necesita que su receta se enve electrnicamente a Psychiatrist, informe a nuestra oficina a travs de MyChart de  [place] o por telfono llamando al [phone] y presione la opcin 4.

## 2022-08-14 NOTE — Telephone Encounter (Signed)
-----   Message from Willeen Niece sent at 08/13/2022  2:58 PM EDT ----- Skin , right breast MELANOCYTIC NEVUS, INTRADERMAL TYPE, BASE INVOLVED  Benign mole - please call patient

## 2022-08-14 NOTE — Progress Notes (Signed)
   Follow-Up Visit   Subjective  Cassie [name] is a 52 y.o. female who presents for the following: biopsy follow-up on the right breast, benign melanocytic nevus. Area has been oozing and sore/irritating. She has been using Vaseline and keeping it covered.   The following portions of the chart were reviewed this encounter and updated as appropriate: medications, allergies, medical history  Review of Systems:  No other skin or systemic complaints except as noted in HPI or Assessment and Plan.  Objective  Well appearing patient in no apparent distress; mood and affect are within normal limits.  A focused examination was performed of the following areas: Face, breast  Relevant physical exam findings are noted in the Assessment and Plan.  Right Breast Dried crusted ulceration with surrounding mild erythema.       Assessment & Plan   Skin ulcer, unspecified ulcer stage (HCC) Right Breast  Biopsy site, melanocytic nevus.   Start mupirocin 2% ointment Apply once daily with bandage changes dsp 22g 0Rf.  Observe for signs of infection (increased pain, oozing, redness extending outward). If worsens, will send in doxycycline 100 MG 1 po twice a day with food x 1 week.    Return as scheduled.  ICherlyn Labella, CMA, am acting as scribe for Willeen Niece, MD .   Documentation: I have reviewed the above documentation for accuracy and completeness, and I agree with the above.  Willeen Niece, MD

## 2022-08-17 ENCOUNTER — Other Ambulatory Visit: Payer: Self-pay | Admitting: Cardiology

## 2022-08-17 DIAGNOSIS — R058 Other specified cough: Secondary | ICD-10-CM

## 2022-09-25 NOTE — Progress Notes (Unsigned)
Cardiology Office Note:   Date:  09/26/2022  ID:  Cassie, [name] 10-18-1970, MRN [id number] PCP: Eustaquio Boyden, MD  [facility] Providers Cardiologist:  Rollene Rotunda, MD {  History of Present Illness:   Cassie [name] is a 52 y.o. female who presents for evaluation of dizziness.  She had chest pain in the past.  She had normal coronary arteries on cardiac catheterization in 2006.  Myoview obtained on 02/10/2016 was low risk with EF of 78%, no reversible ischemia or infarction.  Echocardiogram obtained on the same day showed EF 60 to 65%, no significant valve disorder.  The patient was referred to cardiology service for evaluation of palpitation and dizziness.  She was diagnosed with COVID-19 in August 2020.  Since her diagnosis, she has had persistent dizziness upon standing.  She also had racing heartbeat that fluctuate to low heart rate.   Repeat echocardiogram obtained on 02/11/2019 showed EF 60 to 65%, normal pulmonary artery pressure, no significant valve disorder.  3 days ZIO monitor did not show significant arrhythmia. She wore a 14 day monitor and had rare supraventricular beats.  Unfortunately she is having "long haul" symptoms from her Covid infection.    She was seen once in the [facility] clinic.  I treated her with Cardizem CD.  Inflammatory markers including hs trop were negative.   She had an appt with our pulmonary.   They did do a CT without any acute findings.  She did have normal C-reactive protein, sed rate, troponin and BNP.   At the last visit I added a low dose of beta blocker.  This seemed to help with her rapid heart rate at baseline.  Her rates have been in the 80s.  She continues with her chronic cough and she sees Dr. Angela Nevin at [facility].  The patient denies any new symptoms such as chest discomfort, neck or arm discomfort. There has been no new shortness of breath, PND or orthopnea. There have been no reported palpitations, presyncope or syncope.   ROS: As stated  in the HPI and negative for all other systems.  Studies Reviewed:    EKG:   NA   Risk Assessment/Calculations:              Physical Exam:   VS:  BP 104/72   Pulse 85   Ht 5\' 5"  (1.651 m)   Wt 200 lb 12.8 oz (91.1 kg)   LMP 01/27/2000   SpO2 97%   BMI 33.41 kg/m    Wt Readings from Last 3 Encounters:  09/26/22 200 lb 12.8 oz (91.1 kg)  04/05/22 211 lb (95.7 kg)  01/17/22 201 lb (91.2 kg)     GEN: Well nourished, well developed in no acute distress NECK: No JVD; No carotid bruits CARDIAC: RRR, no murmurs, rubs, gallops RESPIRATORY:  Clear to auscultation without rales, wheezing or rhonchi  ABDOMEN: Soft, non-tender, non-distended EXTREMITIES:  No edema; No deformity   ASSESSMENT AND PLAN:   PALPITATIONS:   This is OK on Cardizem and beta blocker.  No change in therapy.    Rate is slower at baseline.    HTN: Blood pressure is controlled.  No change in therapy.      Follow up with me in one year.   Signed, Rollene Rotunda, MD

## 2022-09-26 ENCOUNTER — Encounter: Payer: Self-pay | Admitting: Cardiology

## 2022-09-26 ENCOUNTER — Ambulatory Visit: Payer: BC Managed Care – PPO | Attending: Cardiology | Admitting: Cardiology

## 2022-09-26 ENCOUNTER — Ambulatory Visit: Payer: BC Managed Care – PPO | Admitting: Cardiology

## 2022-09-26 VITALS — BP 104/72 | HR 85 | Ht 65.0 in | Wt 200.8 lb

## 2022-09-26 DIAGNOSIS — R002 Palpitations: Secondary | ICD-10-CM

## 2022-09-26 DIAGNOSIS — I10 Essential (primary) hypertension: Secondary | ICD-10-CM

## 2022-09-26 NOTE — Patient Instructions (Signed)
Medication Instructions:  NO CHANGES  *If you need a refill on your cardiac medications before your next appointment, please call your pharmacy*  Follow-Up: At [facility], you and your health needs are our priority.  As part of our continuing mission to provide you with exceptional heart care, we have created designated Provider Care Teams.  These Care Teams include your primary Cardiologist (physician) and Advanced Practice Providers (APPs -  Physician Assistants and Nurse Practitioners) who all work together to provide you with the care you need, when you need it.  We recommend signing up for the patient portal called "MyChart".  Sign up information is provided on this After Visit Summary.  MyChart is used to connect with patients for Virtual Visits (Telemedicine).  Patients are able to view lab/test results, encounter notes, upcoming appointments, etc.  Non-urgent messages can be sent to your provider as well.   To learn more about what you can do with MyChart, go to [email or web address].    Your next appointment:    12 months with Dr. Hochrein 

## 2022-11-08 ENCOUNTER — Other Ambulatory Visit: Payer: Self-pay | Admitting: Cardiology

## 2022-11-08 NOTE — Telephone Encounter (Signed)
RX is not a cardiac medication. Does Dr. Antoine Poche want to refill? Please advise

## 2022-11-08 NOTE — Telephone Encounter (Signed)
Rx should come from medical doctor

## 2022-11-08 NOTE — Telephone Encounter (Signed)
Called pt and left message at 1:15 on 11/08/22 informing pt that she would need to contact her PCP for refills.

## 2022-11-13 NOTE — Telephone Encounter (Signed)
Called Pharmacy on 11/12 at 11:30 and the Pharmacy is denying the refill since it's not a cardiac RX.

## 2022-12-21 ENCOUNTER — Other Ambulatory Visit: Payer: Self-pay | Admitting: Family Medicine

## 2022-12-21 DIAGNOSIS — Z1231 Encounter for screening mammogram for malignant neoplasm of breast: Secondary | ICD-10-CM

## 2023-01-11 ENCOUNTER — Other Ambulatory Visit: Payer: Self-pay | Admitting: Family Medicine

## 2023-01-11 NOTE — Telephone Encounter (Signed)
 Copied from CRM [id number]. Topic: Clinical - Medication Refill >> Jan 11, 2023  8:32 AM Joanell NOVAK wrote: Most Recent Primary Care Visit:  Provider: CORWIN ANTU  Department: [facility]  Visit Type: MYCHART VIDEO VISIT  Date: 11/28/2021  Medication: amitriptyline  (ELAVIL ) 50 MG tablet  Has the patient contacted their pharmacy? Yes (Agent: If no, request that the patient contact the pharmacy for the refill. If patient does not wish to contact the pharmacy document the reason why and proceed with request.) (Agent: If yes, when and what did the pharmacy advise?)  Is this the correct pharmacy for this prescription? Yes If no, delete pharmacy and type the correct one.  This is the patient's preferred pharmacy:  [id number] [address] [facility] - [address] [place] [address] Phone: [phone] Fax: [phone]   Has the prescription been filled recently? No  Is the patient out of the medication? Yes  Has the patient been seen for an appointment in the last year OR does the patient have an upcoming appointment?   Can we respond through MyChart?   Agent: Please be advised that Rx refills may take up to 3 business days. We ask that you follow-up with your pharmacy.

## 2023-01-11 NOTE — Telephone Encounter (Signed)
 Refill request for amitriptyline (ELAVIL) 50 MG tablet   LOV - 10/23/21 Next OV - 03/06/23 Last refill - 08/28/21 #90/3

## 2023-01-13 MED ORDER — AMITRIPTYLINE HCL 50 MG PO TABS: 50.0000 mg | ORAL_TABLET | Freq: Every day | ORAL | 0 refills | Status: DC

## 2023-01-13 NOTE — Telephone Encounter (Signed)
 ERx

## 2023-01-15 ENCOUNTER — Ambulatory Visit: Payer: BC Managed Care – PPO | Admitting: Dermatology

## 2023-01-21 ENCOUNTER — Ambulatory Visit: Payer: BC Managed Care – PPO | Admitting: Family Medicine

## 2023-01-21 ENCOUNTER — Encounter: Payer: Self-pay | Admitting: Family Medicine

## 2023-01-21 VITALS — BP 128/82 | HR 98 | Ht 65.0 in | Wt 206.1 lb

## 2023-01-21 DIAGNOSIS — R000 Tachycardia, unspecified: Secondary | ICD-10-CM

## 2023-01-21 DIAGNOSIS — R058 Other specified cough: Secondary | ICD-10-CM

## 2023-01-21 DIAGNOSIS — E559 Vitamin D deficiency, unspecified: Secondary | ICD-10-CM | POA: Diagnosis not present

## 2023-01-21 DIAGNOSIS — Z23 Encounter for immunization: Secondary | ICD-10-CM | POA: Diagnosis not present

## 2023-01-21 DIAGNOSIS — U099 Post covid-19 condition, unspecified: Secondary | ICD-10-CM

## 2023-01-21 DIAGNOSIS — R053 Chronic cough: Secondary | ICD-10-CM

## 2023-01-21 DIAGNOSIS — F444 Conversion disorder with motor symptom or deficit: Secondary | ICD-10-CM

## 2023-01-21 DIAGNOSIS — R490 Dysphonia: Secondary | ICD-10-CM

## 2023-01-21 DIAGNOSIS — K219 Gastro-esophageal reflux disease without esophagitis: Secondary | ICD-10-CM

## 2023-01-21 LAB — CBC WITH DIFFERENTIAL/PLATELET
Basophils Absolute: 0 10*3/uL (ref 0.0–0.1)
Basophils Relative: 0.7 % (ref 0.0–3.0)
Eosinophils Absolute: 0.1 10*3/uL (ref 0.0–0.7)
Eosinophils Relative: 1.8 % (ref 0.0–5.0)
HCT: 43.1 % (ref 36.0–46.0)
Hemoglobin: 14.8 g/dL (ref 12.0–15.0)
Lymphocytes Relative: 26.4 % (ref 12.0–46.0)
Lymphs Abs: 1.4 10*3/uL (ref 0.7–4.0)
MCHC: 34.2 g/dL (ref 30.0–36.0)
MCV: 95.5 fL (ref 78.0–100.0)
Monocytes Absolute: 0.4 10*3/uL (ref 0.1–1.0)
Monocytes Relative: 7.4 % (ref 3.0–12.0)
Neutro Abs: 3.3 10*3/uL (ref 1.4–7.7)
Neutrophils Relative %: 63.7 % (ref 43.0–77.0)
Platelets: 245 10*3/uL (ref 150.0–400.0)
RBC: 4.51 Mil/uL (ref 3.87–5.11)
RDW: 13.2 % (ref 11.5–15.5)
WBC: 5.2 10*3/uL (ref 4.0–10.5)

## 2023-01-21 LAB — COMPREHENSIVE METABOLIC PANEL
ALT: 22 U/L (ref 0–35)
AST: 18 U/L (ref 0–37)
Albumin: 4.8 g/dL (ref 3.5–5.2)
Alkaline Phosphatase: 101 U/L (ref 39–117)
BUN: 16 mg/dL (ref 6–23)
CO2: 26 meq/L (ref 19–32)
Calcium: 9.7 mg/dL (ref 8.4–10.5)
Chloride: 105 meq/L (ref 96–112)
Creatinine, Ser: 1.04 mg/dL (ref 0.40–1.20)
GFR: 62.85 mL/min (ref >60.00)
Glucose, Bld: 90 mg/dL (ref 70–99)
Potassium: 4.1 meq/L (ref 3.5–5.1)
Sodium: 140 meq/L (ref 135–145)
Total Bilirubin: 0.5 mg/dL (ref 0.2–1.2)
Total Protein: 7.2 g/dL (ref 6.0–8.3)

## 2023-01-21 LAB — VITAMIN B12: Vitamin B-12: 487 pg/mL (ref 211–911)

## 2023-01-21 LAB — TSH: TSH: 1.18 u[IU]/mL (ref 0.35–5.50)

## 2023-01-21 LAB — VITAMIN D 25 HYDROXY (VIT D DEFICIENCY, FRACTURES): VITD: 41.06 ng/mL (ref 30.00–100.00)

## 2023-01-21 MED ORDER — PANTOPRAZOLE SODIUM 40 MG PO TBEC: 40.0000 mg | DELAYED_RELEASE_TABLET | Freq: Every day | ORAL | 4 refills | Status: AC

## 2023-01-21 MED ORDER — FAMOTIDINE 20 MG PO TABS: 20.0000 mg | ORAL_TABLET | Freq: Every day | ORAL | 4 refills | Status: AC

## 2023-01-21 MED ORDER — AMITRIPTYLINE HCL 50 MG PO TABS: 50.0000 mg | ORAL_TABLET | Freq: Every day | ORAL | 4 refills | Status: AC

## 2023-01-21 NOTE — Progress Notes (Unsigned)
Ph: [phone] Fax: [phone]   Patient ID: Cassie [name], female    DOB: Jun 10, 1970, 53 y.o.   MRN: [id number]  This visit was conducted in person.  BP 128/82   Pulse 98   Resp (!) 97   Ht 5\' 5"  (1.651 m)   Wt 206 lb 2 oz (93.5 kg)   LMP 01/27/2000   BMI 34.30 kg/m    CC: long-COVID symptoms Subjective:   HPI: Cassie [name] is a 53 y.o. female presenting on 01/21/2023 for Cough (C/o ongoing long Covid sxs: cough, hoarseness, fatigue and heart issues. )   COVID-19 bronchitis 08/2018.  Long hauler symptoms of chronic fatigue, chronic cough and hoarseness, chronic tachycardia since. 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 Regimen includes amitriptyline, diltiazem, pantoprazole, pepcid, with albuterol and atrovent nebs. Also continues dextromethorphan, antihistamine. Now on Trelegy. Has seen COVID long haul clinic at [facility]. In 2023 got accepted to [facility] long hauler COVID research study - this is completed.   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  She continues amitriptyline 50mg  nightly, bisoprolol 2.5mg  daily, chlortrimeton 4mg  BID, diltiazem 240mg  daily, pepcid 20mg  nightly and pantoprazole 40mg  daily.  She is now taking pulmicort neb as needed only.  Off Trelegy inhaler - didn't find chronic use helpful.  Zebeta was added due to elevated resting heart rate.   Last saw allergist Dr Angela Nevin at [facility] post-COVID clinic on 07/2022 - rec continued working from home. To avoid viral exposures, strong perfume/scent exposures. To consider Gefapixant for refractory cough. Seeing Q6 months.   Last saw cardiology Dr Antoine Poche 09/2022 - stable cardiac eval on current regimen.   She continue working from home where she can better control her environment.  Ongoing fatigue, intermittent hoarseness, coughing  flares, triggers are environment changes, weather changes.   To be due for colon cancer screening 06/2023 [name]).  Upcoming mammo tomorrow.  Well woman exam - with [facility] last seen 03/2016. Needs new referral - placed. Last pap 03/2016. s/p hysterectomy age 29yo for endometriosis, ovaries remain.      Relevant past medical, surgical, family and social history reviewed and updated as indicated. Interim medical history since our last visit reviewed. Allergies and medications reviewed and updated. Outpatient Medications Prior to Visit  Medication Sig Dispense Refill   albuterol (VENTOLIN HFA) 108 (90 Base) MCG/ACT inhaler Inhale 2 puffs into the lungs every 6 (six) hours as needed for wheezing or shortness of breath. 8 g 0   amitriptyline (ELAVIL) 50 MG tablet Take 1 tablet (50 mg total) by mouth at bedtime. 90 tablet 0   Biotin 5 MG CAPS Take 1 capsule (5 mg total) by mouth daily.  0   bisoprolol (ZEBETA) 5 MG tablet Take 0.5 tablets (2.5 mg total) by mouth daily. 45 tablet 3   chlorpheniramine (CHLOR-TRIMETON) 4 MG tablet Take 4 mg by mouth 2 (two) times daily.     cholecalciferol (VITAMIN D3) 25 MCG (1000 UNIT) tablet Take 1,000 Units by mouth daily.     diltiazem (CARDIZEM CD) 240 MG 24 hr capsule TAKE 1 CAPSULE BY MOUTH EVERY DAY 90 capsule 3   famotidine (PEPCID) 20 MG tablet Take 1 tablet (20 mg total) by mouth at bedtime. 90 tablet 3   pantoprazole (PROTONIX) 40 MG tablet TAKE 1 TABLET (40 MG TOTAL) BY MOUTH DAILY. TAKE 30-60  MIN BEFORE FIRST MEAL OF THE DAY 90 tablet 2   Respiratory Therapy Supplies (FLUTTER) DEVI Use as directed 1 each 0   benzonatate (TESSALON) 100 MG capsule Take 2 capsules (200 mg total) by mouth 3 (three) times daily as needed for cough. 20 capsule 0   budesonide (PULMICORT) 0.5 MG/2ML nebulizer solution TAKE 2 MLS (0.5 MG TOTAL) BY NEBULIZATION DAILY. 180 mL 1   doxycycline (ORACEA) 40 MG capsule Take 1 capsule (40 mg total) by mouth every morning. 30 capsule  5   Estradiol 10 MCG TABS vaginal tablet Place 1 tablet (10 mcg total) vaginally 2 (two) times a week. (Patient not taking: Reported on 09/26/2022) 30 tablet 12   Ivermectin (SOOLANTRA) 1 % CREA Apply 1 Application topically daily. 45 g 4   metroNIDAZOLE (METROCREAM) 0.75 % cream Apply to face one to two times a day for rosacea. 45 g 11   mupirocin ointment (BACTROBAN) 2 % Apply to affected area once daily with bandage changes. (Patient not taking: Reported on 09/26/2022) 22 g 0   TRELEGY ELLIPTA 200-62.5-25 MCG/ACT AEPB Inhale 1 puff into the lungs daily. 1 each 5   No facility-administered medications prior to visit.     Per HPI unless specifically indicated in ROS section below Review of Systems  Objective:  BP 128/82   Pulse 98   Resp (!) 97   Ht 5\' 5"  (1.651 m)   Wt 206 lb 2 oz (93.5 kg)   LMP 01/27/2000   BMI 34.30 kg/m   Wt Readings from Last 3 Encounters:  01/21/23 206 lb 2 oz (93.5 kg)  09/26/22 200 lb 12.8 oz (91.1 kg)  04/05/22 211 lb (95.7 kg)      Physical Exam Vitals and nursing note reviewed.  Constitutional:      Appearance: Normal appearance. She is not ill-appearing.  HENT:     Head: Normocephalic and atraumatic.     Mouth/Throat:     Mouth: Mucous membranes are moist.     Pharynx: Oropharynx is clear. No oropharyngeal exudate or posterior oropharyngeal erythema.  Eyes:     Extraocular Movements: Extraocular movements intact.     Conjunctiva/sclera: Conjunctivae normal.     Pupils: Pupils are equal, round, and reactive to light.  Neck:     Thyroid: No thyroid mass or thyromegaly.  Cardiovascular:     Rate and Rhythm: Normal rate and regular rhythm.     Pulses: Normal pulses.     Heart sounds: Normal heart sounds. No murmur heard. Pulmonary:     Effort: Pulmonary effort is normal. No respiratory distress.     Breath sounds: Normal breath sounds. No wheezing, rhonchi or rales.     Comments: Intermittent hoarse voice and cough present throughout exam.   Musculoskeletal:     Cervical back: Normal range of motion and neck supple.     Right lower leg: No edema.     Left lower leg: No edema.  Skin:    General: Skin is warm and dry.     Findings: No rash.  Neurological:     Mental Status: She is alert.  Psychiatric:        Mood and Affect: Mood normal.        Behavior: Behavior normal.       Results for orders placed or performed in visit on 01/17/22  Follicle stimulating hormone   Collection Time: 01/17/22  3:02 PM  Result Value Ref Range   FSH 51.1 mIU/mL    Assessment &  Plan:   Problem List Items Addressed This Visit   None    No orders of the defined types were placed in this encounter.   No orders of the defined types were placed in this encounter.   There are no Patient Instructions on file for this visit.  Follow up plan: No follow-ups on file.  Eustaquio Boyden, MD

## 2023-01-21 NOTE — Patient Instructions (Addendum)
Consider low dose naltrexone through local compounding pharmacy - let me know if interested.  [email or web address]  Good to see you today Let us know if you don't hear from GI by summer 2025 and I will place a referral.  Labs today

## 2023-01-22 ENCOUNTER — Ambulatory Visit
Admission: RE | Admit: 2023-01-22 | Discharge: 2023-01-22 | Disposition: A | Discharge status: Home / Self Care | Payer: BC Managed Care – PPO | Source: Ambulatory Visit | Attending: Family Medicine | Admitting: Family Medicine

## 2023-01-22 ENCOUNTER — Encounter: Payer: Self-pay | Admitting: Family Medicine

## 2023-01-22 DIAGNOSIS — Z1231 Encounter for screening mammogram for malignant neoplasm of breast: Secondary | ICD-10-CM | POA: Diagnosis not present

## 2023-01-22 NOTE — Assessment & Plan Note (Signed)
Continues pantoprazole and pepcid regularly.

## 2023-01-22 NOTE — Assessment & Plan Note (Addendum)
No longer on regular respiratory medications, only taking pulmicort neb PRN - rarely.  Fully off Trelegy.

## 2023-01-22 NOTE — Assessment & Plan Note (Signed)
Update levels on 1000 international units  daily.

## 2023-01-22 NOTE — Assessment & Plan Note (Signed)
COVID long-hauler.  Ongoing struggle with chronic fatigue, cough, hoarseness, tachycardia, exercise intolerance.  She has been through 2 separate investigational studies ([facility] and [facility]) She continues seen COVID long hauler clinic at [facility] - upcoming appt in next few months On multiple medications with limited benefit.  Discussed low dose naltrexone, website and pamphlet provided, reviewed off label use of medication. She will research this and let me know if interested, would then send to local compounding pharmacy at dosing schedule of:  1mg  daily with titration of 1mg  every 2 weeks to goal 4.5mg  daily.

## 2023-02-12 DIAGNOSIS — R490 Dysphonia: Secondary | ICD-10-CM | POA: Diagnosis not present

## 2023-02-12 DIAGNOSIS — U099 Post covid-19 condition, unspecified: Secondary | ICD-10-CM | POA: Diagnosis not present

## 2023-02-12 DIAGNOSIS — R053 Chronic cough: Secondary | ICD-10-CM | POA: Diagnosis not present

## 2023-03-06 ENCOUNTER — Ambulatory Visit: Payer: BC Managed Care – PPO | Admitting: Family Medicine

## 2023-03-13 ENCOUNTER — Other Ambulatory Visit: Payer: Self-pay | Admitting: Cardiology

## 2023-03-13 DIAGNOSIS — R002 Palpitations: Secondary | ICD-10-CM

## 2023-05-13 DIAGNOSIS — U099 Post covid-19 condition, unspecified: Secondary | ICD-10-CM | POA: Diagnosis not present

## 2023-08-27 ENCOUNTER — Encounter: Payer: BC Managed Care – PPO | Admitting: Dermatology

## 2023-09-05 ENCOUNTER — Encounter: Payer: Self-pay | Admitting: Cardiology

## 2023-09-05 ENCOUNTER — Other Ambulatory Visit: Payer: Self-pay | Admitting: Cardiology

## 2023-09-05 DIAGNOSIS — R002 Palpitations: Secondary | ICD-10-CM

## 2023-09-24 ENCOUNTER — Other Ambulatory Visit: Payer: Self-pay | Admitting: Cardiology

## 2023-09-25 ENCOUNTER — Ambulatory Visit

## 2023-09-25 DIAGNOSIS — L988 Other specified disorders of the skin and subcutaneous tissue: Secondary | ICD-10-CM

## 2023-09-25 DIAGNOSIS — L814 Other melanin hyperpigmentation: Secondary | ICD-10-CM

## 2023-09-25 DIAGNOSIS — L820 Inflamed seborrheic keratosis: Secondary | ICD-10-CM

## 2023-09-25 DIAGNOSIS — W908XXA Exposure to other nonionizing radiation, initial encounter: Secondary | ICD-10-CM | POA: Diagnosis not present

## 2023-09-25 DIAGNOSIS — L821 Other seborrheic keratosis: Secondary | ICD-10-CM

## 2023-09-25 DIAGNOSIS — D1801 Hemangioma of skin and subcutaneous tissue: Secondary | ICD-10-CM | POA: Diagnosis not present

## 2023-09-25 DIAGNOSIS — L578 Other skin changes due to chronic exposure to nonionizing radiation: Secondary | ICD-10-CM

## 2023-09-25 DIAGNOSIS — D229 Melanocytic nevi, unspecified: Secondary | ICD-10-CM

## 2023-09-25 DIAGNOSIS — Z1283 Encounter for screening for malignant neoplasm of skin: Secondary | ICD-10-CM

## 2023-09-25 DIAGNOSIS — Z808 Family history of malignant neoplasm of other organs or systems: Secondary | ICD-10-CM

## 2023-09-25 MED ORDER — TRETINOIN 0.025 % EX CREA: TOPICAL_CREAM | Freq: Every day | CUTANEOUS | 5 refills | Status: AC

## 2023-09-25 MED ORDER — TRETINOIN 0.025 % EX CREA: TOPICAL_CREAM | Freq: Every day | CUTANEOUS | 5 refills | Status: DC

## 2023-09-25 NOTE — Patient Instructions (Addendum)
 Recommend Elta MD facial sun screen  UV Clear Recommend morning a vitamin c skin suticals , alastin or other otc products Recommend retinol 0.025 % use pea sized amount to face nightly as tolerated.   Topical retinoid medications like tretinoin /Retin-A , adapalene/Differin, tazarotene/Fabior, and Epiduo/Epiduo Forte can cause dryness and irritation when first started. Only apply a pea-sized amount to the entire affected area. Avoid applying it around the eyes, edges of mouth and creases at the nose. If you experience irritation, use a good moisturizer first and/or apply the medicine less often. If you are doing well with the medicine, you can increase how often you use it until you are applying every night. Be careful with sun protection while using this medication as it can make you sensitive to the sun. This medicine should not be used by pregnant women.    Instructions for [facility] Medications  One or more of your medications was sent to the [facility] mail order compounding pharmacy. You will receive an email from them and can purchase the medicine through that link. It will then be mailed to your home at the address you confirmed. If for any reason you do not receive an email from them, please check your spam folder. If you still do not find the email, please let us  know. [facility] phone number is [phone].     If spot at right upper chest treated today is not resolved after a month give office a call or send mychart. Will need to recheck area.    Cryotherapy Aftercare  Wash gently with soap and water everyday.   Apply Vaseline and Band-Aid daily until healed.    Seborrheic Keratosis  What causes seborrheic keratoses? Seborrheic keratoses are harmless, common skin growths that first appear during adult life.  As time goes by, more growths appear.  Some people may develop a large number of them.  Seborrheic keratoses appear on both covered and uncovered body parts.   They are not caused by sunlight.  The tendency to develop seborrheic keratoses can be inherited.  They vary in color from skin-colored to gray, brown, or even black.  They can be either smooth or have a rough, warty surface.   Seborrheic keratoses are superficial and look as if they were stuck on the skin.  Under the microscope this type of keratosis looks like layers upon layers of skin.  That is why at times the top layer may seem to fall off, but the rest of the growth remains and re-grows.    Treatment Seborrheic keratoses do not need to be treated, but can easily be removed in the office.  Seborrheic keratoses often cause symptoms when they rub on clothing or jewelry.  Lesions can be in the way of shaving.  If they become inflamed, they can cause itching, soreness, or burning.  Removal of a seborrheic keratosis can be accomplished by freezing, burning, or surgery. If any spot bleeds, scabs, or grows rapidly, please return to have it checked, as these can be an indication of a skin cancer.   Melanoma ABCDEs  Melanoma is the most dangerous type of skin cancer, and is the leading cause of death from skin disease.  You are more likely to develop melanoma if you: Have light-colored skin, light-colored eyes, or red or blond hair Spend a lot of time in the sun Tan regularly, either outdoors or in a tanning bed Have had blistering sunburns, especially during childhood Have a close family member who has had  a melanoma Have atypical moles or large birthmarks  Early detection of melanoma is key since treatment is typically straightforward and cure rates are extremely high if we catch it early.   The first sign of melanoma is often a change in a mole or a new dark spot.  The ABCDE system is a way of remembering the signs of melanoma.  A for asymmetry:  The two halves do not match. B for border:  The edges of the growth are irregular. C for color:  A mixture of colors are present instead of an even  brown color. D for diameter:  Melanomas are usually (but not always) greater than 6mm - the size of a pencil eraser. E for evolution:  The spot keeps changing in size, shape, and color.  Please check your skin once per month between visits. You can use a small mirror in front and a large mirror behind you to keep an eye on the back side or your body.   If you see any new or changing lesions before your next follow-up, please call to schedule a visit.  Please continue daily skin protection including broad spectrum sunscreen SPF 30+ to sun-exposed areas, reapplying every 2 hours as needed when you're outdoors.   Staying in the shade or wearing long sleeves, sun glasses (UVA+UVB protection) and wide brim hats (4-inch brim around the entire circumference of the hat) are also recommended for sun protection.     Due to recent changes in healthcare laws, you may see results of your pathology and/or laboratory studies on MyChart before the doctors have had a chance to review them. We understand that in some cases there may be results that are confusing or concerning to you. Please understand that not all results are received at the same time and often the doctors may need to interpret multiple results in order to provide you with the best plan of care or course of treatment. Therefore, we ask that you please give us  2 business days to thoroughly review all your results before contacting the office for clarification. Should we see a critical lab result, you will be contacted sooner.   If You Need Anything After Your Visit  If you have any questions or concerns for your doctor, please call our main line at [phone] and press option 4 to reach your doctor's medical assistant. If no one answers, please leave a voicemail as directed and we will return your call as soon as possible. Messages left after 4 pm will be answered the following business day.   You may also send us  a message via MyChart. We  typically respond to MyChart messages within 1-2 business days.  For prescription refills, please ask your pharmacy to contact our office. Our fax number is [phone].  If you have an urgent issue when the clinic is closed that cannot wait until the next business day, you can page your doctor at the number below.    Please note that while we do our best to be available for urgent issues outside of office hours, we are not available 24/7.   If you have an urgent issue and are unable to reach us , you may choose to seek medical care at your doctor's office, retail clinic, urgent care center, or emergency room.  If you have a medical emergency, please immediately call 911 or go to the emergency department.  Pager Numbers  - Dr. Hester: [phone]  - Dr. Jackquline: [phone]  - Dr. Claudene: [phone]   -  Dr. Raymund: [phone]  In the event of inclement weather, please call our main line at [phone] for an update on the status of any delays or closures.  Dermatology Medication Tips: Please keep the boxes that topical medications come in in order to help keep track of the instructions about where and how to use these. Pharmacies typically print the medication instructions only on the boxes and not directly on the medication tubes.   If your medication is too expensive, please contact our office at [phone] option 4 or send us  a message through MyChart.   We are unable to tell what your co-pay for medications will be in advance as this is different depending on your insurance coverage. However, we may be able to find a substitute medication at lower cost or fill out paperwork to get insurance to cover a needed medication.   If a prior authorization is required to get your medication covered by your insurance company, please allow us  1-2 business days to complete this process.  Drug prices often vary depending on where the prescription is filled and some pharmacies may  offer cheaper prices.  The website www.goodrx.com contains coupons for medications through different pharmacies. The prices here do not account for what the cost may be with help from insurance (it may be cheaper with your insurance), but the website can give you the price if you did not use any insurance.  - You can print the associated coupon and take it with your prescription to the pharmacy.  - You may also stop by our office during regular business hours and pick up a GoodRx coupon card.  - If you need your prescription sent electronically to a different pharmacy, notify our office through [facility] or by phone at [phone] option 4.     Si Usted Necesita Algo Despus de Su Visita  Tambin puede enviarnos un mensaje a travs de Clinical cytogeneticist. Por lo general respondemos a los mensajes de MyChart en el transcurso de 1 a 2 das hbiles.  Para renovar recetas, por favor pida a su farmacia que se ponga en contacto con nuestra oficina. Randi lakes de fax es [place] [phone].  Si tiene un asunto urgente cuando la clnica est cerrada y que no puede esperar hasta el siguiente da hbil, puede llamar/localizar a su doctor(a) al nmero que aparece a continuacin.   Por favor, tenga en cuenta que aunque hacemos todo lo posible para estar disponibles para asuntos urgentes fuera del horario de [place], no estamos disponibles las 24 horas del da, los 7 [address]  [address] de la [place].   Si tiene un problema urgente y no puede comunicarse con nosotros, puede optar por buscar atencin mdica  en el consultorio de su doctor(a), en una clnica privada, en un centro de atencin urgente o en una sala de emergencias.  Si tiene Engineer, drilling, por favor llame inmediatamente al 911 o vaya a la sala de emergencias.  Nmeros de bper  - Dr. Hester: [phone]  - Dra. Jackquline: [phone]  - Dr. Claudene: [phone]  - Dra. Kitts: [phone]  En caso de inclemencias del [place], por favor llame  a nuestra lnea principal al [phone] para una actualizacin sobre el estado de cualquier retraso o cierre.  Consejos para la medicacin en dermatologa: Por favor, guarde las cajas en las que vienen los medicamentos de uso tpico para ayudarle a seguir las instrucciones sobre dnde y cmo usarlos. Las farmacias generalmente imprimen las instrucciones del medicamento slo en las cajas y  no directamente en los tubos del medicamento.   Si su medicamento es muy caro, por favor, pngase en contacto con landry rieger llamando al [phone] y presione la opcin 4 o envenos un mensaje a travs de Clinical cytogeneticist.   No podemos decirle cul ser su copago por los medicamentos por adelantado ya que esto es diferente dependiendo de la cobertura de su seguro. Sin embargo, es posible que podamos encontrar un medicamento sustituto a Audiological scientist un formulario para que el seguro cubra el medicamento que se considera necesario.   Si se requiere una autorizacin previa para que su compaa de seguros malta su medicamento, por favor permtanos de 1 a 2 das hbiles para completar este proceso.  Los precios de los medicamentos varan con frecuencia dependiendo del Environmental consultant de dnde se surte la receta y alguna farmacias pueden ofrecer precios ms baratos.  El sitio web www.goodrx.com tiene cupones para medicamentos de Health and safety inspector. Los precios aqu no tienen en cuenta lo que podra costar con la ayuda del seguro (puede ser ms barato con su seguro), pero el sitio web puede darle el precio si no utiliz Tourist information centre manager.  - Puede imprimir el cupn correspondiente y llevarlo con su receta a la farmacia.  - Tambin puede pasar por nuestra oficina durante el horario de atencin regular y Education officer, museum una tarjeta de cupones de GoodRx.  - Si necesita que su receta se enve electrnicamente a una farmacia diferente, informe a nuestra oficina a travs de MyChart de [place] o por telfono llamando al [phone] y  presione la opcin 4.

## 2023-09-25 NOTE — Progress Notes (Addendum)
 Subjective   Cassie [name] is a 53 y.o. female who presents for the following: Total body skin exam for skin cancer screening and mole check. The patient has spots, moles and lesions to be evaluated, some may be new or changing and the patient may have concern these could be cancer.. Patient is established patient   Today patient reports: Hx of isks Patient reports a spot at the right upper chest that has been there for over a month and will not go away   Review of Systems:    No other skin or systemic complaints except as noted in HPI or Assessment and Plan.  The following portions of the chart were reviewed this encounter and updated as appropriate: medications, allergies, medical history  Relevant Medical History:  Family history of skin cancer - father has meloma, mother bcc scc, and sister has had melanoma    Objective  Well appearing patient in no apparent distress; mood and affect are within normal limits. Examination was performed of the: Full Skin Examination: scalp, head, eyes, ears, nose, lips, neck, chest, axillae, abdomen, back, buttocks, bilateral upper extremities, bilateral lower extremities, hands, feet, fingers, toes, fingernails, and toenails.   Examination notable for: SKIN EXAM, Angioma(s): Scattered red vascular papule(s)  , Lentigo/lentigines: Scattered pigmented macules that are tan to brown in color and are somewhat non-uniform in shape and concentrated in the sun-exposed areas, Nevus/nevi: Scattered well-demarcated, regular, pigmented macule(s) and/or papule(s)  , Seborrheic Keratosis(es): Stuck-on appearing keratotic papule(s) on the trunk, some  irritated with redness, crusting, edema, and/or partial avulsion, Actinic Damage/Elastosis: chronic sun damage: dyspigmentation, telangiectasia, and wrinkling  Examination limited by: Undergarments   Right Breast Erythematous stuck-on, waxy papule or plaque  Assessment & Plan   SKIN CANCER SCREENING PERFORMED  TODAY.  BENIGN SKIN FINDINGS  - Lentigines  - Seborrheic keratoses  - Hemangiomas   - Nevus/Multiple Benign Nevi - Reassurance provided regarding the benign appearance of lesions noted on exam today; no treatment is indicated in the absence of symptoms/changes. - Reinforced importance of photoprotective strategies including liberal and frequent sunscreen use of a broad-spectrum SPF 30 or greater, use of protective clothing, and sun avoidance for prevention of cutaneous malignancy and photoaging.  Counseled patient on the importance of regular self-skin monitoring as well as routine clinical skin examinations as scheduled.   ACTINIC DAMAGE - Chronic condition, secondary to cumulative UV/sun exposure - Recommend daily broad spectrum sunscreen SPF 30+ to sun-exposed areas, reapply every 2 hours as needed.  - Staying in the shade or wearing long sleeves, sun glasses (UVA+UVB protection) and wide brim hats (4-inch brim around the entire circumference of the hat) are also recommended for sun protection.  - Call for new or changing lesions.  Personal history of family history of melanoma-  father, sister  - Reviewed medical history for full details  - Reviewed sun protective measures as above - Encouraged full body skin exams     Rhytides, volume loss of face - Discussed tx options including botox, fillers, lasers, skincare options - start tretinoin  0.025% cream in the evening. Educated patient about proper use and potential side effects, including dryness, irritation, sun sensitivity, and transient worsening of acne.   Level of service outlined above   Procedures, orders, diagnosis for this visit:  INFLAMED SEBORRHEIC KERATOSIS Right Breast Symptomatic, irritating, patient would like treated.  Advised patient within a month if spot is still there after treatment to call or send mychart. Will consider biopsy  Destruction  of lesion - Right Breast Complexity: simple   Destruction method:  cryotherapy   Informed consent: discussed and consent obtained   Timeout:  patient name, date of birth, surgical site, and procedure verified Lesion destroyed using liquid nitrogen: Yes   Region frozen until ice ball extended beyond lesion: Yes   Outcome: patient tolerated procedure well with no complications   Post-procedure details: wound care instructions given    SKIN EXAM FOR MALIGNANT NEOPLASM   LENTIGO   SEBORRHEIC KERATOSIS   MULTIPLE BENIGN NEVI   CHERRY ANGIOMA   ACTINIC SKIN DAMAGE   RHYTIDES    Inflamed seborrheic keratosis -     Destruction of lesion  Skin exam for malignant neoplasm  Lentigo  Seborrheic keratosis  Multiple benign nevi  Cherry angioma  Actinic skin damage  Rhytides  Other orders -     Tretinoin ; Apply topically at bedtime.  Dispense: 45 g; Refill: 5    Return to clinic: Return in about 1 year (around 09/24/2024) for TBSE.  Documentation: I have reviewed the above documentation for accuracy and completeness, and I agree with the above.  Lauraine JAYSON Kanaris, MD

## 2023-10-16 NOTE — Progress Notes (Unsigned)
 Cardiology Office Note:   Date:  10/17/2023  ID:  [name], [name] 1970/02/15, MRN [id number] PCP: Rilla Baller, MD  [facility] Providers Cardiologist:  Lynwood Schilling, MD {  History of Present Illness:   Cassie [name] is a 53 y.o. female who presents for evaluation of dizziness.  She had chest pain in the past.  She had normal coronary arteries on cardiac catheterization in 2006.  Myoview  obtained on 02/10/2016 was low risk with EF of 78%, no reversible ischemia or infarction.  Echocardiogram obtained on the same day showed EF 60 to 65%, no significant valve disorder.  The patient was referred to cardiology service for evaluation of palpitation and dizziness.  She was diagnosed with COVID-19 in August 2020.  Since her diagnosis, she has had persistent dizziness upon standing.  She also had racing heartbeat that fluctuate to low heart rate.   Repeat echocardiogram obtained on 02/11/2019 showed EF 60 to 65%, normal pulmonary artery pressure, no significant valve disorder.  3 days ZIO monitor did not show significant arrhythmia. She wore a 14 day monitor and had rare supraventricular beats.  Unfortunately she is having long haul symptoms from her Covid infection.    She was seen once in the [facility] clinic.  I treated her with Cardizem  CD.  Inflammatory markers including hs trop were negative.   She had an appt with our pulmonary.   They did do a CT without any acute findings.  She did have normal C-reactive protein, sed rate, troponin and BNP.   Since I last saw her she has done much better.  Her resting heart rate has come down.  She is having few palpitations.  Her cough is improved.  She have any new shortness of breath, PND or orthopnea.  She had no new palpitations, presyncope or syncope.  ROS: As stated in the HPI and negative for all other systems.  Studies Reviewed:    EKG:   EKG Interpretation Date/Time:  Thursday October 17 2023 16:01:29 EDT Ventricular Rate:   74 PR Interval:  196 QRS Duration:  74 QT Interval:  384 QTC Calculation: 426 R Axis:   11  Text Interpretation: Normal sinus rhythm Low voltage QRS RSR' or QR pattern in V1 suggests right ventricular conduction delay When compared with ECG of 09-Feb-2016 18:23, No significant change since last tracing Confirmed by Schilling Lynwood ([id number]) on 10/17/2023 4:43:09 PM    Risk Assessment/Calculations:              Physical Exam:   VS:  BP 113/71 (BP Location: Left Arm, Patient Position: Sitting, Cuff Size: Large)   Pulse 81   Ht 5' 4.5 (1.638 m)   Wt 213 lb 3.2 oz (96.7 kg)   LMP 01/27/2000   SpO2 96%   BMI 36.03 kg/m    Wt Readings from Last 3 Encounters:  10/17/23 213 lb 3.2 oz (96.7 kg)  01/21/23 206 lb 2 oz (93.5 kg)  09/26/22 200 lb 12.8 oz (91.1 kg)     GEN: Well nourished, well developed in no acute distress NECK: No JVD; No carotid bruits CARDIAC: RRR, no murmurs, rubs, gallops RESPIRATORY:  Clear to auscultation without rales, wheezing or rhonchi  ABDOMEN: Soft, non-tender, non-distended EXTREMITIES:  Mild leg edema; No deformity   ASSESSMENT AND PLAN:       PALPITATIONS:   The patient is doing much better on Cardizem  and current dose of beta-blocker.  No change in therapy.   HTN: Blood pressure  is at target.  No change in therapy.   Follow up with me in 1 year.  Signed, Lynwood Schilling, MD

## 2023-10-17 ENCOUNTER — Other Ambulatory Visit: Payer: Self-pay | Admitting: Cardiology

## 2023-10-17 ENCOUNTER — Ambulatory Visit: Attending: Cardiology | Admitting: Cardiology

## 2023-10-17 ENCOUNTER — Encounter: Payer: Self-pay | Admitting: Cardiology

## 2023-10-17 VITALS — BP 113/71 | HR 81 | Ht 64.5 in | Wt 213.2 lb

## 2023-10-17 DIAGNOSIS — I10 Essential (primary) hypertension: Secondary | ICD-10-CM | POA: Diagnosis not present

## 2023-10-17 DIAGNOSIS — R002 Palpitations: Secondary | ICD-10-CM | POA: Diagnosis not present

## 2023-10-17 MED ORDER — DILTIAZEM HCL ER COATED BEADS 240 MG PO CP24
240.0000 mg | ORAL_CAPSULE | Freq: Every day | ORAL | 3 refills | Status: AC
Start: 2023-10-17 — End: ?

## 2023-10-17 MED ORDER — BISOPROLOL FUMARATE 5 MG PO TABS: 2.5000 mg | ORAL_TABLET | Freq: Every day | ORAL | 3 refills | Status: AC

## 2023-10-17 NOTE — Patient Instructions (Signed)
 Medication Instructions:  Your physician recommends that you continue on your current medications as directed. Please refer to the Current Medication list given to you today.  *If you need a refill on your cardiac medications before your next appointment, please call your pharmacy*  Follow-Up: At [facility], you and your health needs are our priority.  As part of our continuing mission to provide you with exceptional heart care, our providers are all part of one team.  This team includes your primary Cardiologist (physician) and Advanced Practice Providers or APPs (Physician Assistants and Nurse Practitioners) who all work together to provide you with the care you need, when you need it.  Your next appointment:   1 year(s)  Provider:   Eilleen Grates, MD

## 2023-11-13 DIAGNOSIS — Z23 Encounter for immunization: Secondary | ICD-10-CM | POA: Diagnosis not present

## 2023-11-13 DIAGNOSIS — J40 Bronchitis, not specified as acute or chronic: Secondary | ICD-10-CM | POA: Diagnosis not present

## 2023-11-13 DIAGNOSIS — R0609 Other forms of dyspnea: Secondary | ICD-10-CM | POA: Diagnosis not present

## 2023-11-13 DIAGNOSIS — R053 Chronic cough: Secondary | ICD-10-CM | POA: Diagnosis not present

## 2023-11-21 ENCOUNTER — Ambulatory Visit: Admitting: Family Medicine

## 2023-11-21 ENCOUNTER — Encounter: Payer: Self-pay | Admitting: Family Medicine

## 2023-11-21 VITALS — BP 101/70 | HR 88 | Ht 64.0 in | Wt 213.8 lb

## 2023-11-21 DIAGNOSIS — Z1211 Encounter for screening for malignant neoplasm of colon: Secondary | ICD-10-CM

## 2023-11-21 DIAGNOSIS — R232 Flushing: Secondary | ICD-10-CM | POA: Diagnosis not present

## 2023-11-21 MED ORDER — ESTRADIOL 0.025 MG/24HR TD PTTW: 1.0000 | MEDICATED_PATCH | TRANSDERMAL | 12 refills | Status: DC

## 2023-11-21 NOTE — Progress Notes (Signed)
   Subjective:    Patient ID: Cassie [name] is a 53 y.o. female presenting with No chief complaint on file.  on 11/21/2023  HPI: Here for menopause. 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 Working form home. Still has some cough and is slowly improving.  Has a new grandbaby. Fatigue, hot sweats, wake in the night, insomnia, on amitriptyline . Mood swings. Hot flashes. Does dress in layers. Has vaginal dryness. Did not take vagifem . Having increased acne. Having new pains that feel similar to her endometriosis. Sister was started on HRT and is doing better.  Review of Systems  Constitutional:  Negative for chills and fever.  Respiratory:  Negative for shortness of breath.   Cardiovascular:  Negative for chest pain.  Gastrointestinal:  Negative for abdominal pain, nausea and vomiting.  Endocrine:       Hot flashes  Genitourinary:  Negative for dysuria.  Skin:  Negative for rash.       acne      Objective:    BP 101/70   Pulse 88   Ht 5' 4 (1.626 m)   Wt 213 lb 12.8 oz (97 kg)   LMP 01/27/2000   BMI 36.70 kg/m  Physical Exam Exam conducted with a chaperone present.  Constitutional:      General: She is not in acute distress.    Appearance: She is well-developed.  HENT:     Head: Normocephalic and atraumatic.  Eyes:     General: No scleral icterus. Cardiovascular:     Rate and Rhythm: Normal rate.  Pulmonary:     Effort: Pulmonary effort is normal.  Abdominal:     Palpations: Abdomen is soft.  Musculoskeletal:     Cervical back: Neck supple.  Skin:    General: Skin is warm and dry.  Neurological:     Mental Status: She is alert and oriented to person, place, and time.         Assessment & Plan:   Problem List Items Addressed This Visit       Unprioritized   Hot flashes - Primary   Worsening. After thorough discussion of R/B/A of HRT. She is s/p TAH and does not need progesterone. Start Vivelle  dot. Side effects reviewed.       Relevant Medications    estradiol  (VIVELLE -DOT) 0.025 MG/24HR   Other Visit Diagnoses       Screen for colon cancer       Relevant Orders   Ambulatory referral to Gastroenterology       No follow-ups on file.  Glenys GORMAN Birk, MD 11/21/2023 1:21 PM

## 2023-11-21 NOTE — Assessment & Plan Note (Addendum)
 Worsening. After thorough discussion of R/B/A of HRT. She is s/p TAH and does not need progesterone. Start Vivelle  dot. Side effects reviewed.

## 2023-11-30 ENCOUNTER — Encounter: Payer: Self-pay | Admitting: Internal Medicine

## 2023-12-25 ENCOUNTER — Telehealth: Payer: Self-pay

## 2023-12-25 NOTE — Telephone Encounter (Signed)
 Attempted to reach patient concerning colonoscopy recall; unable to speak with patient;  left message and number to the office for patient to call back and schedule appts;

## 2024-01-03 NOTE — Telephone Encounter (Signed)
 Attempted to reach patient concerning colonoscopy recall; unable to speak with patient;  left message and number to the office for patient to call back and schedule appts;

## 2024-01-10 ENCOUNTER — Encounter: Payer: Self-pay | Admitting: Family Medicine

## 2024-01-10 ENCOUNTER — Telehealth: Admitting: Family Medicine

## 2024-01-10 DIAGNOSIS — R232 Flushing: Secondary | ICD-10-CM | POA: Diagnosis not present

## 2024-01-10 MED ORDER — ESTRADIOL 0.0375 MG/24HR TD PTTW: 1.0000 | MEDICATED_PATCH | TRANSDERMAL | 12 refills | Status: DC

## 2024-01-10 NOTE — Progress Notes (Signed)
 CC: Doing okay, stating that bilateral breast tenderness the first couple of weeks and vaginal discharge, no odor.

## 2024-01-10 NOTE — Progress Notes (Signed)
 "   GYNECOLOGY VIRTUAL VISIT ENCOUNTER NOTE  Provider location: [facility]   Patient location: Home  I connected with Cassie [name] on 01/10/2024 at 10:55 AM EST by MyChart Video Encounter and verified that I am speaking with the correct person using two identifiers.   I discussed the limitations, risks, security and privacy concerns of performing an evaluation and management service virtually and the availability of in person appointments. I also discussed with the patient that there may be a patient responsible charge related to this service. The patient expressed understanding and agreed to proceed.   History:  Cassie [name] is a 54 y.o. G55P2012 female being evaluated today for menopause. Started on Vivelle  and notes an improvement in symptoms. Still having a low of evening wakening. Hot flashes and vaginal dryness are better. She denies any abnormal vaginal discharge, bleeding, pelvic pain or other concerns.       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  Persistent symptoms since then Followed by pulm   Rosacea    mild   Past Surgical History:  Procedure Laterality Date   BREAST BIOPSY  2010   left; core biopsy   BREAST EXCISIONAL BIOPSY Bilateral    multiple bilateral   BREAST MASS EXCISION  2008   BREAST SURGERY Right    excision of lesion   CARDIAC CATHETERIZATION  2007   CHOLECYSTECTOMY  2008   COLONOSCOPY  2006   WNL Rinda)   COLONOSCOPY  06/2013   mod diverticulosis, rpt 10 yrs  Oletta)   LAPAROSCOPY  1994   endometriosis   TONSILLECTOMY  1994   TONSILLECTOMY AND ADENOIDECTOMY  1995   TOTAL ABDOMINAL HYSTERECTOMY     Dr Hazel for endometriosis   The following portions of the patient's history were reviewed and updated as appropriate: allergies, current medications, past family history, past medical history, past social history, past surgical history and problem list.   Health Maintenance:  Does not need paps, she is s/p TAH. Normal mammogram on 01/23/2023.   Review of Systems:  Pertinent items noted in HPI and remainder of comprehensive ROS otherwise negative.  Physical Exam:   General:  Alert, oriented and cooperative. Patient appears to be in no acute distress.  Mental Status: Normal mood and affect. Normal behavior. Normal judgment and thought content.   Respiratory: Normal respiratory effort, no problems with respiration noted  Rest of physical exam deferred due to type of encounter  Labs and Imaging No results found for this or any previous visit (from the past 2 weeks). No results found.     Assessment and Plan:     Problem List Items Addressed This Visit       Unprioritized   Hot flashes   Some improvement. Will increase her patch to 0.0375 and see if this has improved effect.      Relevant Medications   estradiol  (VIVELLE -DOT) 0.0375 MG/24HR (Start on 01/13/2024)  I discussed the assessment and treatment plan with the patient. The patient was provided an opportunity to ask questions and all were answered. The patient agreed with the plan and demonstrated an understanding of the instructions.   The patient was advised to call back or seek an in-person evaluation/go to the ED if the symptoms worsen or if the condition fails to improve as anticipated.  I provided 6 minutes of face-to-face time during this encounter. I also spent 4 minutes dedicated to the care of this patient including pre-visit review of records, post visit  ordering of medications and appropriate tests or procedures, coordinating care and documenting this visit encounter.    Cassie GORMAN Birk, MD [facility], [facility]   "

## 2024-01-10 NOTE — Assessment & Plan Note (Signed)
 Some improvement. Will increase her patch to 0.0375 and see if this has improved effect.

## 2024-02-04 ENCOUNTER — Other Ambulatory Visit: Payer: Self-pay | Admitting: Family Medicine

## 2024-02-04 DIAGNOSIS — R232 Flushing: Secondary | ICD-10-CM

## 2024-02-05 ENCOUNTER — Telehealth: Payer: Self-pay | Admitting: *Deleted

## 2024-02-05 ENCOUNTER — Other Ambulatory Visit: Payer: Self-pay | Admitting: Family Medicine

## 2024-02-05 DIAGNOSIS — Z1231 Encounter for screening mammogram for malignant neoplasm of breast: Secondary | ICD-10-CM

## 2024-02-05 DIAGNOSIS — R232 Flushing: Secondary | ICD-10-CM

## 2024-02-05 NOTE — Telephone Encounter (Signed)
 Pt left voicemail stating the estrogen patches are leaving a red circle-around the patch, not sure if it an allergic reaction and or should change her medication.

## 2024-02-06 MED ORDER — ESTRADIOL 0.5 MG/0.5GM TD GEL: 1.0000 [IU] | Freq: Every day | TRANSDERMAL | 2 refills | Status: AC

## 2024-02-06 NOTE — Addendum Note (Signed)
 Addended by: FREDIRICK GLENYS RAMAN on: 02/06/2024 08:38 AM   Modules accepted: Orders

## 2024-02-26 ENCOUNTER — Encounter

## 2024-09-28 ENCOUNTER — Encounter
